# Patient Record
Sex: Male | Born: 1945 | Race: Black or African American | Hispanic: No | Marital: Single | Smoking: Never smoker
Health system: Southern US, Community
[De-identification: ages and names within clinical notes are randomized; demographics above are authoritative.]

## PROBLEM LIST (undated history)

## (undated) DIAGNOSIS — F0280 Dementia in other diseases classified elsewhere without behavioral disturbance: Secondary | ICD-10-CM

## (undated) DIAGNOSIS — D649 Anemia, unspecified: Secondary | ICD-10-CM

## (undated) DIAGNOSIS — F0390 Unspecified dementia without behavioral disturbance: Secondary | ICD-10-CM

## (undated) DIAGNOSIS — E119 Type 2 diabetes mellitus without complications: Secondary | ICD-10-CM

## (undated) DIAGNOSIS — I10 Essential (primary) hypertension: Secondary | ICD-10-CM

## (undated) DIAGNOSIS — I639 Cerebral infarction, unspecified: Secondary | ICD-10-CM

## (undated) DIAGNOSIS — G309 Alzheimer's disease, unspecified: Secondary | ICD-10-CM

## (undated) DIAGNOSIS — F329 Major depressive disorder, single episode, unspecified: Secondary | ICD-10-CM

## (undated) DIAGNOSIS — C189 Malignant neoplasm of colon, unspecified: Secondary | ICD-10-CM

## (undated) DIAGNOSIS — I4891 Unspecified atrial fibrillation: Secondary | ICD-10-CM

## (undated) DIAGNOSIS — N289 Disorder of kidney and ureter, unspecified: Secondary | ICD-10-CM

## (undated) DIAGNOSIS — E876 Hypokalemia: Secondary | ICD-10-CM

## (undated) DIAGNOSIS — N390 Urinary tract infection, site not specified: Secondary | ICD-10-CM

## (undated) DIAGNOSIS — R1310 Dysphagia, unspecified: Secondary | ICD-10-CM

## (undated) DIAGNOSIS — R296 Repeated falls: Secondary | ICD-10-CM

## (undated) DIAGNOSIS — H9190 Unspecified hearing loss, unspecified ear: Secondary | ICD-10-CM

## (undated) DIAGNOSIS — E871 Hypo-osmolality and hyponatremia: Secondary | ICD-10-CM

## (undated) DIAGNOSIS — E785 Hyperlipidemia, unspecified: Secondary | ICD-10-CM

## (undated) DIAGNOSIS — F32A Depression, unspecified: Secondary | ICD-10-CM

## (undated) DIAGNOSIS — I509 Heart failure, unspecified: Secondary | ICD-10-CM

## (undated) DIAGNOSIS — F22 Delusional disorders: Secondary | ICD-10-CM

## (undated) HISTORY — PX: CHOLECYSTECTOMY: SHX55

## (undated) HISTORY — PX: CARDIAC CATHETERIZATION: SHX172

## (undated) HISTORY — PX: COLON SURGERY: SHX602

## (undated) HISTORY — PX: PEG TUBE REMOVAL: SHX2187

## (undated) HISTORY — PX: ILEOSTOMY: SHX1783

## (undated) HISTORY — PX: ABDOMINAL ADHESION SURGERY: SHX90

---

## 1997-04-13 ENCOUNTER — Emergency Department (HOSPITAL_COMMUNITY): Admission: EM | Admit: 1997-04-13 | Discharge: 1997-04-13 | Payer: Self-pay | Admitting: Emergency Medicine

## 1997-06-27 ENCOUNTER — Other Ambulatory Visit: Admission: RE | Admit: 1997-06-27 | Discharge: 1997-06-27 | Payer: Self-pay | Admitting: Family Medicine

## 1997-12-17 ENCOUNTER — Encounter: Payer: Self-pay | Admitting: Family Medicine

## 1997-12-17 ENCOUNTER — Ambulatory Visit (HOSPITAL_COMMUNITY): Admission: RE | Admit: 1997-12-17 | Discharge: 1997-12-17 | Payer: Self-pay | Admitting: Family Medicine

## 1997-12-19 ENCOUNTER — Ambulatory Visit (HOSPITAL_COMMUNITY): Admission: RE | Admit: 1997-12-19 | Discharge: 1997-12-19 | Payer: Self-pay | Admitting: Internal Medicine

## 1998-05-29 ENCOUNTER — Emergency Department (HOSPITAL_COMMUNITY): Admission: EM | Admit: 1998-05-29 | Discharge: 1998-05-29 | Payer: Self-pay | Admitting: Emergency Medicine

## 1998-07-07 ENCOUNTER — Ambulatory Visit (HOSPITAL_COMMUNITY): Admission: RE | Admit: 1998-07-07 | Discharge: 1998-07-07 | Payer: Self-pay | Admitting: Internal Medicine

## 2003-07-19 ENCOUNTER — Emergency Department (HOSPITAL_COMMUNITY): Admission: EM | Admit: 2003-07-19 | Discharge: 2003-07-19 | Payer: Self-pay

## 2003-09-17 ENCOUNTER — Ambulatory Visit: Payer: Self-pay | Admitting: Nurse Practitioner

## 2003-09-19 ENCOUNTER — Ambulatory Visit: Payer: Self-pay | Admitting: Nurse Practitioner

## 2003-10-16 ENCOUNTER — Ambulatory Visit: Payer: Self-pay | Admitting: *Deleted

## 2004-01-14 ENCOUNTER — Ambulatory Visit: Payer: Self-pay | Admitting: Nurse Practitioner

## 2004-01-23 ENCOUNTER — Ambulatory Visit: Payer: Self-pay | Admitting: Nurse Practitioner

## 2004-03-09 ENCOUNTER — Ambulatory Visit: Payer: Self-pay | Admitting: Nurse Practitioner

## 2004-04-02 ENCOUNTER — Ambulatory Visit: Payer: Self-pay | Admitting: Nurse Practitioner

## 2004-04-20 ENCOUNTER — Ambulatory Visit: Payer: Self-pay | Admitting: Nurse Practitioner

## 2004-06-18 ENCOUNTER — Ambulatory Visit: Payer: Self-pay | Admitting: Nurse Practitioner

## 2004-08-28 ENCOUNTER — Ambulatory Visit: Payer: Self-pay | Admitting: Nurse Practitioner

## 2004-09-09 ENCOUNTER — Ambulatory Visit: Payer: Self-pay | Admitting: Nurse Practitioner

## 2004-09-21 ENCOUNTER — Ambulatory Visit: Payer: Self-pay | Admitting: Nurse Practitioner

## 2004-09-22 ENCOUNTER — Ambulatory Visit: Payer: Self-pay | Admitting: Nurse Practitioner

## 2004-11-18 ENCOUNTER — Ambulatory Visit: Payer: Self-pay | Admitting: Nurse Practitioner

## 2004-12-28 ENCOUNTER — Ambulatory Visit: Payer: Self-pay | Admitting: Nurse Practitioner

## 2005-01-12 ENCOUNTER — Ambulatory Visit: Payer: Self-pay | Admitting: Nurse Practitioner

## 2005-02-10 ENCOUNTER — Ambulatory Visit: Payer: Self-pay | Admitting: Nurse Practitioner

## 2005-03-02 ENCOUNTER — Ambulatory Visit: Payer: Self-pay | Admitting: Nurse Practitioner

## 2005-04-16 ENCOUNTER — Ambulatory Visit: Payer: Self-pay | Admitting: Nurse Practitioner

## 2005-04-28 ENCOUNTER — Ambulatory Visit: Payer: Self-pay | Admitting: Nurse Practitioner

## 2005-05-07 ENCOUNTER — Ambulatory Visit: Payer: Self-pay | Admitting: Nurse Practitioner

## 2005-06-04 ENCOUNTER — Ambulatory Visit: Payer: Self-pay | Admitting: Nurse Practitioner

## 2005-06-18 ENCOUNTER — Ambulatory Visit: Payer: Self-pay | Admitting: Nurse Practitioner

## 2005-09-01 ENCOUNTER — Ambulatory Visit: Payer: Self-pay | Admitting: Nurse Practitioner

## 2005-10-04 ENCOUNTER — Ambulatory Visit: Payer: Self-pay | Admitting: Nurse Practitioner

## 2005-11-25 ENCOUNTER — Ambulatory Visit: Payer: Self-pay | Admitting: Nurse Practitioner

## 2006-01-25 ENCOUNTER — Ambulatory Visit: Payer: Self-pay | Admitting: Nurse Practitioner

## 2006-02-10 ENCOUNTER — Ambulatory Visit: Payer: Self-pay | Admitting: Nurse Practitioner

## 2006-03-08 ENCOUNTER — Ambulatory Visit: Payer: Self-pay | Admitting: Nurse Practitioner

## 2006-03-17 ENCOUNTER — Ambulatory Visit: Payer: Self-pay | Admitting: Nurse Practitioner

## 2006-05-10 ENCOUNTER — Ambulatory Visit: Payer: Self-pay | Admitting: Nurse Practitioner

## 2006-07-25 ENCOUNTER — Ambulatory Visit: Payer: Self-pay | Admitting: Internal Medicine

## 2006-08-01 ENCOUNTER — Encounter (INDEPENDENT_AMBULATORY_CARE_PROVIDER_SITE_OTHER): Payer: Self-pay | Admitting: Family Medicine

## 2006-08-01 ENCOUNTER — Ambulatory Visit: Payer: Self-pay | Admitting: *Deleted

## 2006-08-01 ENCOUNTER — Ambulatory Visit (HOSPITAL_COMMUNITY): Admission: RE | Admit: 2006-08-01 | Discharge: 2006-08-01 | Payer: Self-pay | Admitting: Family Medicine

## 2006-08-10 ENCOUNTER — Ambulatory Visit: Payer: Self-pay | Admitting: Internal Medicine

## 2006-08-10 ENCOUNTER — Encounter (INDEPENDENT_AMBULATORY_CARE_PROVIDER_SITE_OTHER): Payer: Self-pay | Admitting: Nurse Practitioner

## 2006-08-10 LAB — CONVERTED CEMR LAB
Cholesterol: 170 mg/dL (ref 0–200)
Triglycerides: 159 mg/dL — ABNORMAL HIGH (ref <150)

## 2006-11-24 ENCOUNTER — Ambulatory Visit: Payer: Self-pay | Admitting: Internal Medicine

## 2006-11-24 ENCOUNTER — Encounter (INDEPENDENT_AMBULATORY_CARE_PROVIDER_SITE_OTHER): Payer: Self-pay | Admitting: Nurse Practitioner

## 2006-11-24 LAB — CONVERTED CEMR LAB
AST: 15 units/L (ref 0–37)
Alkaline Phosphatase: 54 units/L (ref 39–117)
BUN: 19 mg/dL (ref 6–23)
Basophils Relative: 0 % (ref 0–1)
Calcium: 9.1 mg/dL (ref 8.4–10.5)
Creatinine, Ser: 0.97 mg/dL (ref 0.40–1.50)
Eosinophils Absolute: 0 10*3/uL — ABNORMAL LOW (ref 0.2–0.7)
Eosinophils Relative: 0 % (ref 0–5)
HDL: 46 mg/dL (ref >39)
Hemoglobin: 10.4 g/dL — ABNORMAL LOW (ref 13.0–17.0)
MCHC: 32.4 g/dL (ref 30.0–36.0)
MCV: 92.5 fL (ref 78.0–100.0)
Monocytes Absolute: 0.6 10*3/uL (ref 0.1–1.0)
Monocytes Relative: 15 % — ABNORMAL HIGH (ref 3–12)
RBC: 3.47 M/uL — ABNORMAL LOW (ref 4.22–5.81)
RDW: 15.3 % (ref 11.5–15.5)
TSH: 1.606 microintl units/mL (ref 0.350–5.50)
Total Bilirubin: 1.4 mg/dL — ABNORMAL HIGH (ref 0.3–1.2)
Total CHOL/HDL Ratio: 3.3
VLDL: 29 mg/dL (ref 0–40)

## 2007-01-12 DIAGNOSIS — C189 Malignant neoplasm of colon, unspecified: Secondary | ICD-10-CM

## 2007-01-12 HISTORY — DX: Malignant neoplasm of colon, unspecified: C18.9

## 2007-04-05 ENCOUNTER — Ambulatory Visit: Payer: Self-pay | Admitting: Family Medicine

## 2007-05-04 ENCOUNTER — Encounter: Admission: RE | Admit: 2007-05-04 | Discharge: 2007-05-04 | Payer: Self-pay | Admitting: Family Medicine

## 2007-06-02 ENCOUNTER — Ambulatory Visit (HOSPITAL_COMMUNITY): Admission: RE | Admit: 2007-06-02 | Discharge: 2007-06-02 | Payer: Self-pay | Admitting: Cardiology

## 2007-06-02 ENCOUNTER — Encounter (INDEPENDENT_AMBULATORY_CARE_PROVIDER_SITE_OTHER): Payer: Self-pay | Admitting: Cardiology

## 2007-06-02 ENCOUNTER — Ambulatory Visit: Payer: Self-pay | Admitting: Surgery

## 2007-06-07 ENCOUNTER — Ambulatory Visit (HOSPITAL_COMMUNITY): Admission: RE | Admit: 2007-06-07 | Discharge: 2007-06-07 | Payer: Self-pay | Admitting: Cardiology

## 2007-06-15 ENCOUNTER — Inpatient Hospital Stay (HOSPITAL_COMMUNITY): Admission: AD | Admit: 2007-06-15 | Discharge: 2007-06-21 | Payer: Self-pay | Admitting: Cardiology

## 2007-08-10 ENCOUNTER — Encounter (INDEPENDENT_AMBULATORY_CARE_PROVIDER_SITE_OTHER): Payer: Self-pay | Admitting: Internal Medicine

## 2007-08-10 ENCOUNTER — Inpatient Hospital Stay (HOSPITAL_COMMUNITY): Admission: EM | Admit: 2007-08-10 | Discharge: 2007-10-11 | Payer: Self-pay | Admitting: *Deleted

## 2007-08-10 ENCOUNTER — Ambulatory Visit: Payer: Self-pay | Admitting: Surgery

## 2007-08-17 ENCOUNTER — Encounter: Payer: Self-pay | Admitting: Gastroenterology

## 2007-08-23 ENCOUNTER — Ambulatory Visit: Payer: Self-pay | Admitting: Gastroenterology

## 2007-08-24 ENCOUNTER — Encounter: Payer: Self-pay | Admitting: Internal Medicine

## 2007-09-04 ENCOUNTER — Ambulatory Visit: Payer: Self-pay | Admitting: Internal Medicine

## 2007-09-06 HISTORY — PX: COLECTOMY: SHX59

## 2007-09-07 ENCOUNTER — Encounter (INDEPENDENT_AMBULATORY_CARE_PROVIDER_SITE_OTHER): Payer: Self-pay | Admitting: General Surgery

## 2007-09-20 ENCOUNTER — Ambulatory Visit: Payer: Self-pay | Admitting: Internal Medicine

## 2007-10-11 ENCOUNTER — Inpatient Hospital Stay: Admission: RE | Admit: 2007-10-11 | Discharge: 2007-10-24 | Payer: Self-pay | Admitting: Internal Medicine

## 2007-10-30 ENCOUNTER — Inpatient Hospital Stay (HOSPITAL_COMMUNITY): Admission: EM | Admit: 2007-10-30 | Discharge: 2007-11-01 | Payer: Self-pay | Admitting: Emergency Medicine

## 2008-06-04 ENCOUNTER — Ambulatory Visit: Payer: Self-pay | Admitting: Hematology & Oncology

## 2008-06-05 ENCOUNTER — Ambulatory Visit (HOSPITAL_COMMUNITY): Admission: RE | Admit: 2008-06-05 | Discharge: 2008-06-05 | Payer: Self-pay | Admitting: Internal Medicine

## 2008-06-28 LAB — CBC WITH DIFFERENTIAL (CANCER CENTER ONLY)
BASO%: 0.4 % (ref 0.0–2.0)
EOS%: 1.7 % (ref 0.0–7.0)
HCT: 37.5 % — ABNORMAL LOW (ref 38.7–49.9)
LYMPH#: 0.9 10*3/uL (ref 0.9–3.3)
LYMPH%: 22.4 % (ref 14.0–48.0)
MCH: 26.9 pg — ABNORMAL LOW (ref 28.0–33.4)
MCHC: 32.3 g/dL (ref 32.0–35.9)
MONO%: 10.9 % (ref 0.0–13.0)
NEUT%: 64.6 % (ref 40.0–80.0)
RDW: 15 % — ABNORMAL HIGH (ref 10.5–14.6)

## 2008-06-28 LAB — COMPREHENSIVE METABOLIC PANEL
AST: 34 U/L (ref 0–37)
Albumin: 4.4 g/dL (ref 3.5–5.2)
Alkaline Phosphatase: 64 U/L (ref 39–117)
Potassium: 4.1 mEq/L (ref 3.5–5.3)
Sodium: 141 mEq/L (ref 135–145)
Total Protein: 7.8 g/dL (ref 6.0–8.3)

## 2008-07-24 ENCOUNTER — Encounter (INDEPENDENT_AMBULATORY_CARE_PROVIDER_SITE_OTHER): Payer: Self-pay | Admitting: *Deleted

## 2008-09-06 ENCOUNTER — Encounter (INDEPENDENT_AMBULATORY_CARE_PROVIDER_SITE_OTHER): Payer: Self-pay | Admitting: *Deleted

## 2008-09-13 ENCOUNTER — Inpatient Hospital Stay (HOSPITAL_COMMUNITY): Admission: EM | Admit: 2008-09-13 | Discharge: 2008-09-17 | Payer: Self-pay | Admitting: Emergency Medicine

## 2008-09-13 ENCOUNTER — Encounter (INDEPENDENT_AMBULATORY_CARE_PROVIDER_SITE_OTHER): Payer: Self-pay | Admitting: *Deleted

## 2008-09-17 ENCOUNTER — Encounter (INDEPENDENT_AMBULATORY_CARE_PROVIDER_SITE_OTHER): Payer: Self-pay | Admitting: *Deleted

## 2008-10-01 ENCOUNTER — Ambulatory Visit: Payer: Self-pay | Admitting: Hematology & Oncology

## 2008-10-04 LAB — COMPREHENSIVE METABOLIC PANEL
AST: 13 U/L (ref 0–37)
Alkaline Phosphatase: 56 U/L (ref 39–117)
BUN: 16 mg/dL (ref 6–23)
Calcium: 9.2 mg/dL (ref 8.4–10.5)
Chloride: 104 mEq/L (ref 96–112)
Creatinine, Ser: 1.2 mg/dL (ref 0.40–1.50)
Total Bilirubin: 0.4 mg/dL (ref 0.3–1.2)

## 2008-10-04 LAB — CBC WITH DIFFERENTIAL (CANCER CENTER ONLY)
BASO#: 0 10*3/uL (ref 0.0–0.2)
BASO%: 0.4 % (ref 0.0–2.0)
HCT: 32.3 % — ABNORMAL LOW (ref 38.7–49.9)
LYMPH%: 20.9 % (ref 14.0–48.0)
MCHC: 33.3 g/dL (ref 32.0–35.9)
MCV: 80 fL — ABNORMAL LOW (ref 82–98)
MONO#: 0.3 10*3/uL (ref 0.1–0.9)
NEUT%: 69.6 % (ref 40.0–80.0)
RDW: 15.8 % — ABNORMAL HIGH (ref 10.5–14.6)
WBC: 3.5 10*3/uL — ABNORMAL LOW (ref 4.0–10.0)

## 2008-11-13 ENCOUNTER — Encounter: Payer: Self-pay | Admitting: Gastroenterology

## 2008-11-18 ENCOUNTER — Ambulatory Visit: Payer: Self-pay | Admitting: Gastroenterology

## 2008-11-18 DIAGNOSIS — G309 Alzheimer's disease, unspecified: Secondary | ICD-10-CM

## 2008-11-18 DIAGNOSIS — R933 Abnormal findings on diagnostic imaging of other parts of digestive tract: Secondary | ICD-10-CM | POA: Insufficient documentation

## 2008-11-18 DIAGNOSIS — F0280 Dementia in other diseases classified elsewhere without behavioral disturbance: Secondary | ICD-10-CM

## 2008-11-18 DIAGNOSIS — E119 Type 2 diabetes mellitus without complications: Secondary | ICD-10-CM | POA: Insufficient documentation

## 2008-12-02 ENCOUNTER — Ambulatory Visit: Payer: Self-pay | Admitting: Gastroenterology

## 2008-12-11 ENCOUNTER — Encounter: Payer: Self-pay | Admitting: Gastroenterology

## 2009-02-13 IMAGING — CR DG ABDOMEN ACUTE W/ 1V CHEST
3 series · 3 of 3 positions shown · non-contrast
Comparison: Acute abdominal series 08/26/2007

CLINICAL DATA: Abdominal pain, distension

ACUTE ABDOMEN SERIES (ABDOMEN 2 VIEW & CHEST 1 VIEW)

[t abdomen supine (1 of 2)]
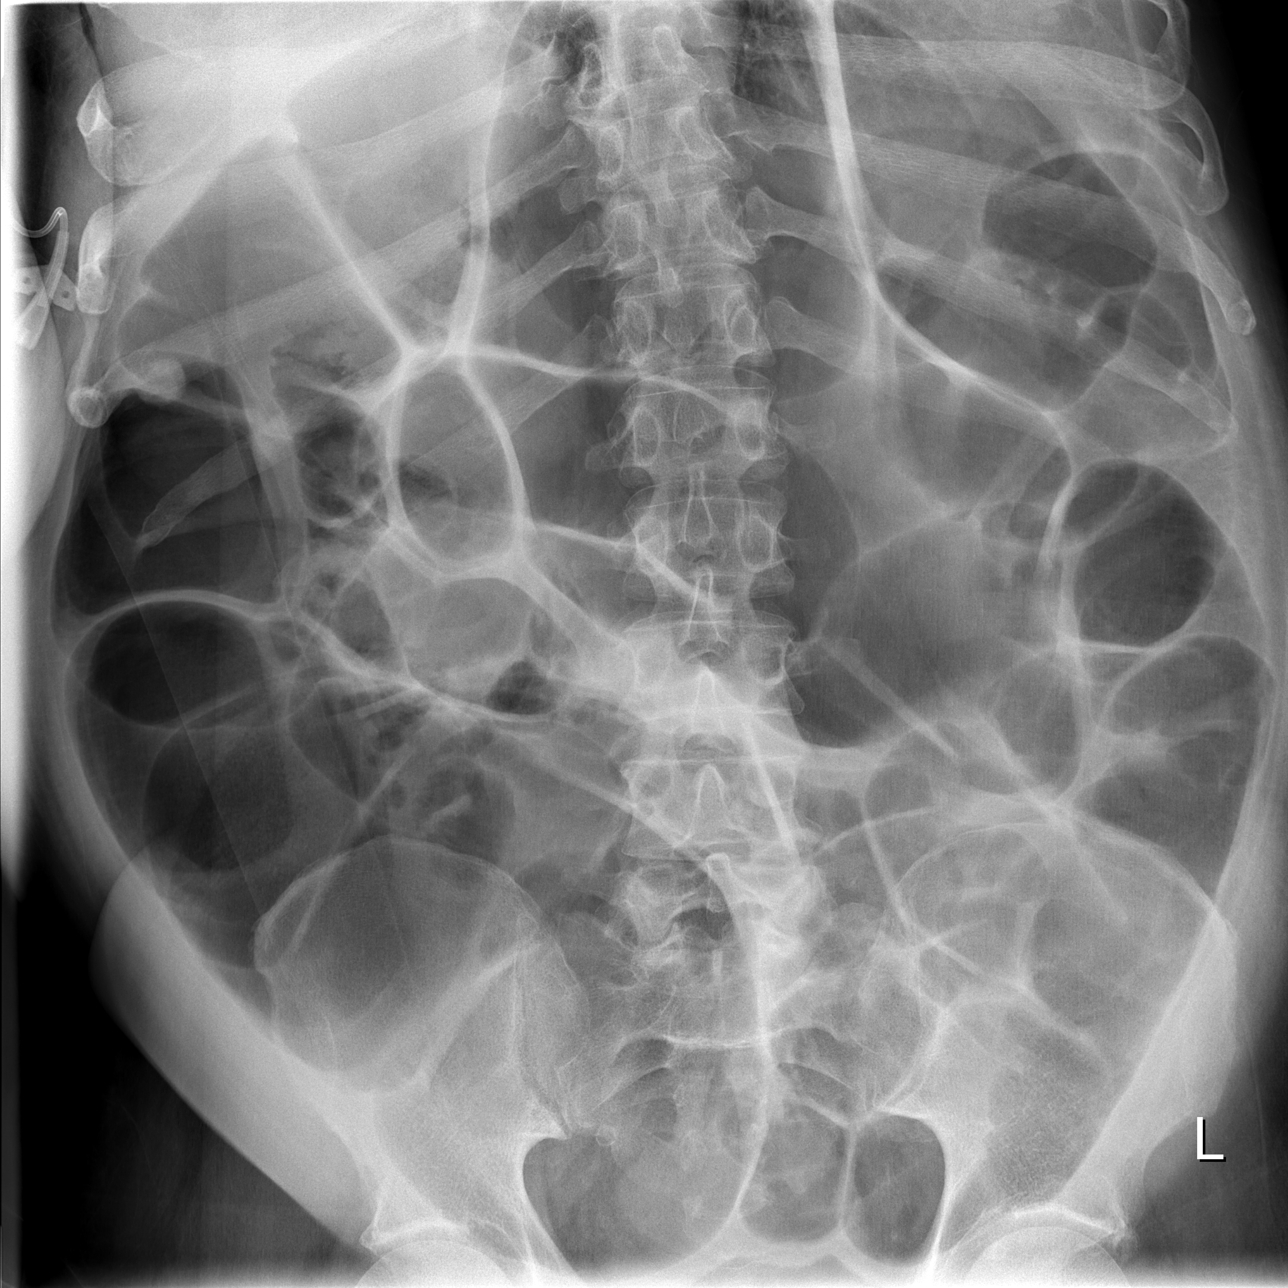

[t abdomen supine (2 of 2)]
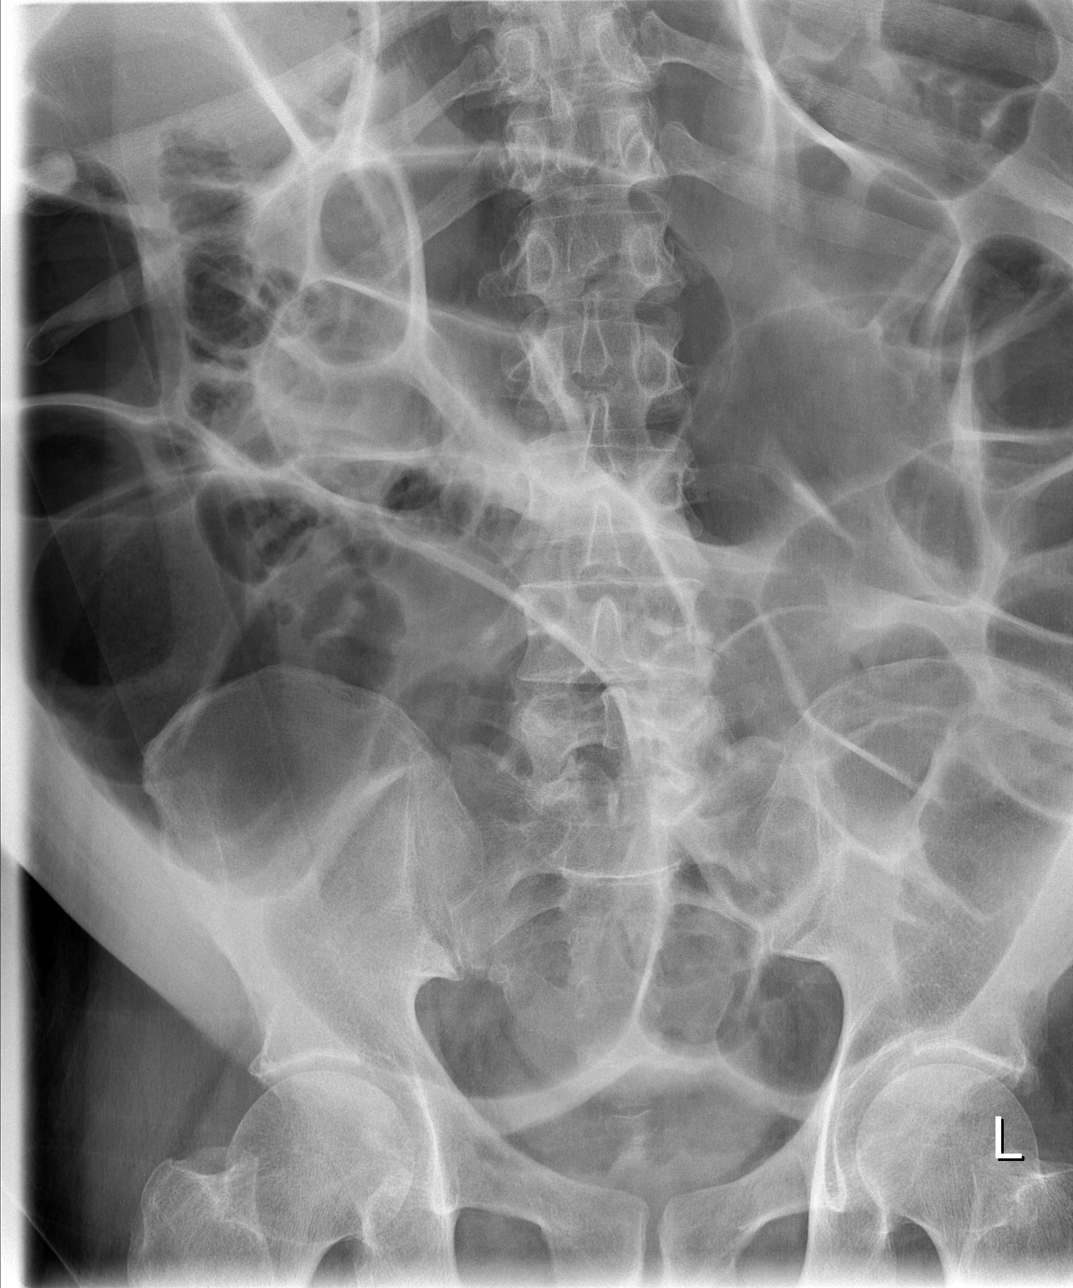

[w abdomen decub *]
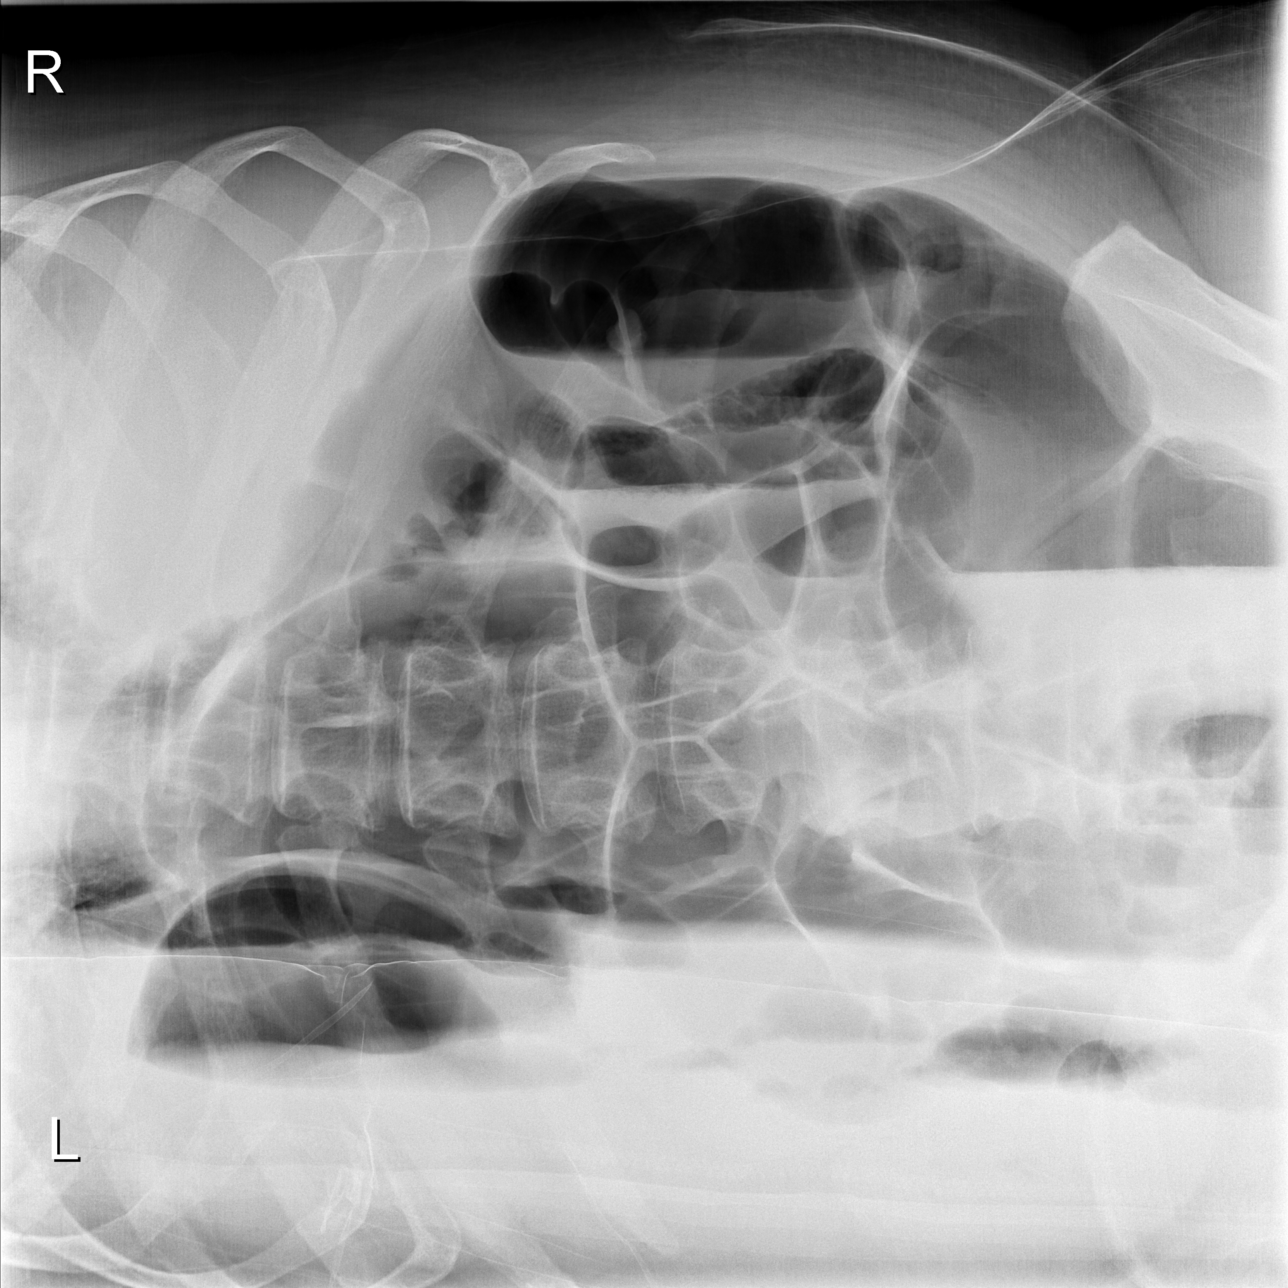

[3 of 3 positions shown; findings below may reference images not displayed]

FINDINGS: Right PICC line is in place with the tip in the mid right
atrium.  There are low lung volumes.  No focal opacity.  Heart
mildly enlarged.

Marked gaseous distension of the colon diffusely.  Suspect severe
ileus although distal obstruction could have this appearance.
Small bowel loops are nondistended.  No free air.
IMPRESSION: Marked gaseous distension of the colon diffusely, which I favor
represents severe ileus.  Distal obstruction could have this
appearance as well.  Findings are similar to prior CT.

## 2009-02-21 IMAGING — CT CT HEAD W/O CM
1 series · 16 of 30 positions shown, 20 images · non-contrast
Comparison: Brain MRI head and CT 08/10/2007

CLINICAL DATA: Altered mental status.

CT HEAD WITHOUT CONTRAST
TECHNIQUE: Contiguous axial images were obtained from the base of
the skull through the vertex without contrast.

[Series 2: (id) head 4.8 h37s st · axial · 0.56mm/px · z∈[+485,+622]mm · 16 of 30 slices shown, 20 images]
[im 2/30  brain]
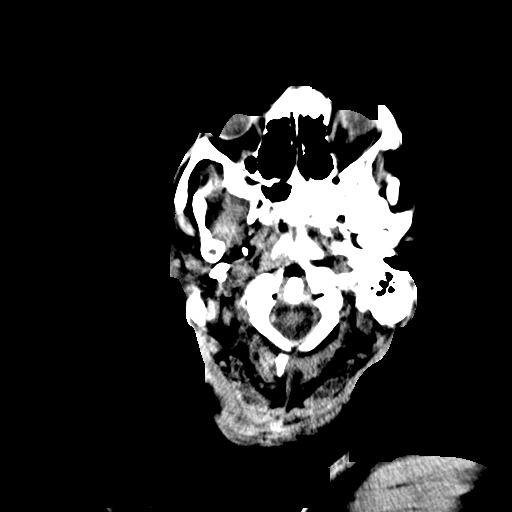
[im 2/30  bone]
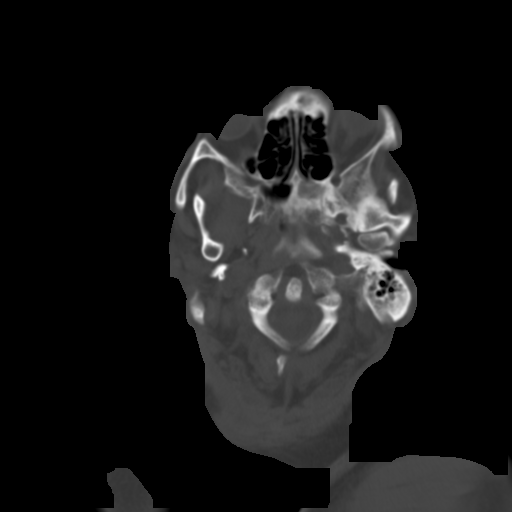
[im 4/30  brain]
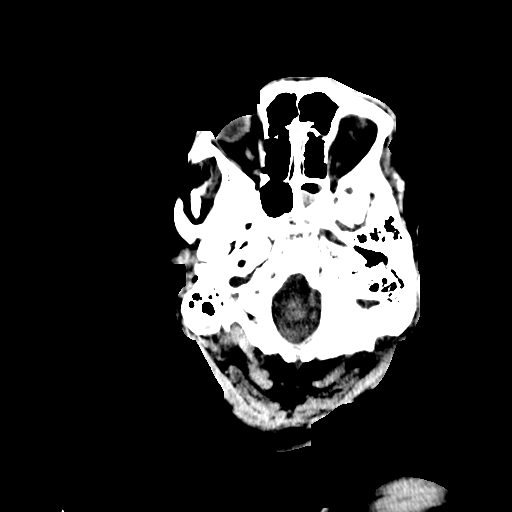
[im 6/30  brain]
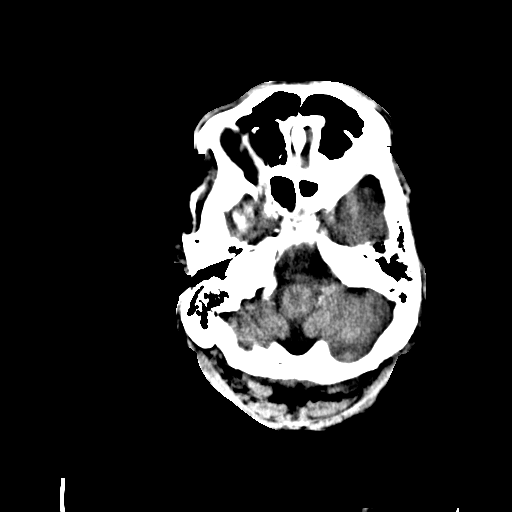
[im 8/30  brain]
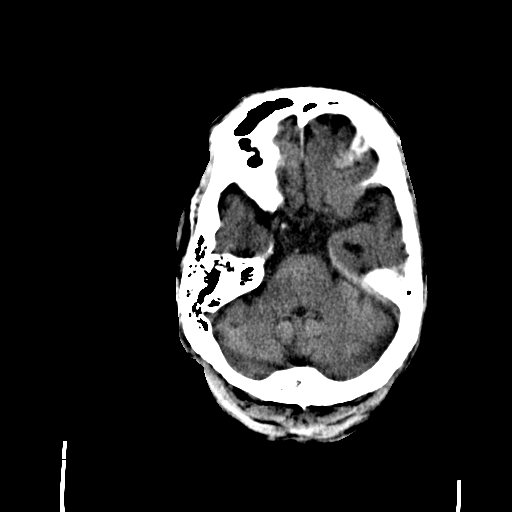
[im 9/30  brain]
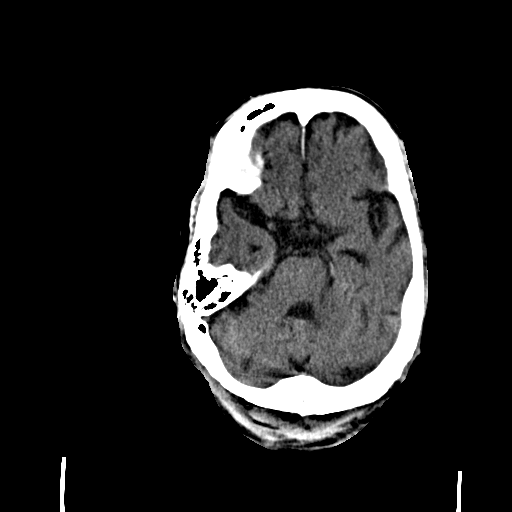
[im 9/30  bone]
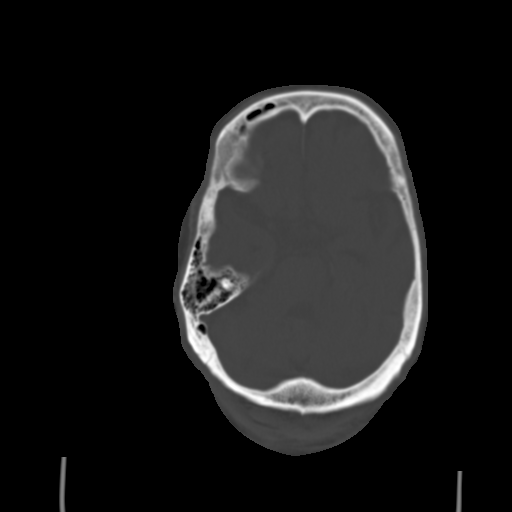
[im 11/30  brain]
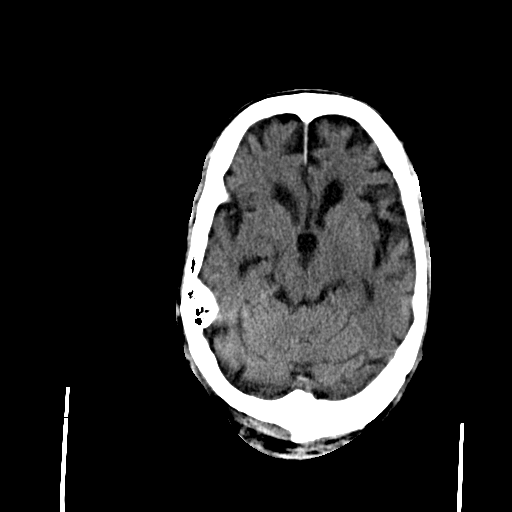
[im 13/30  brain]
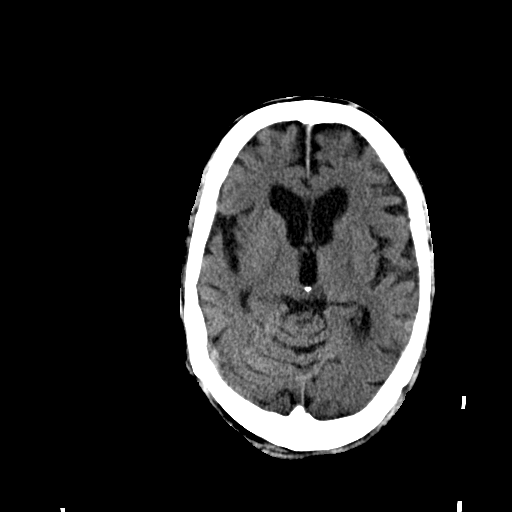
[im 15/30  brain]
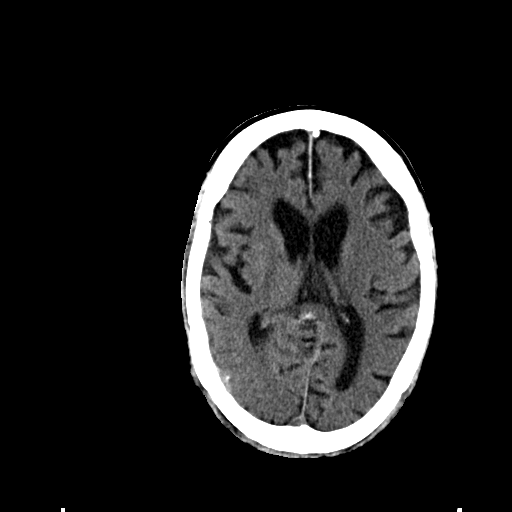
[im 16/30  brain]
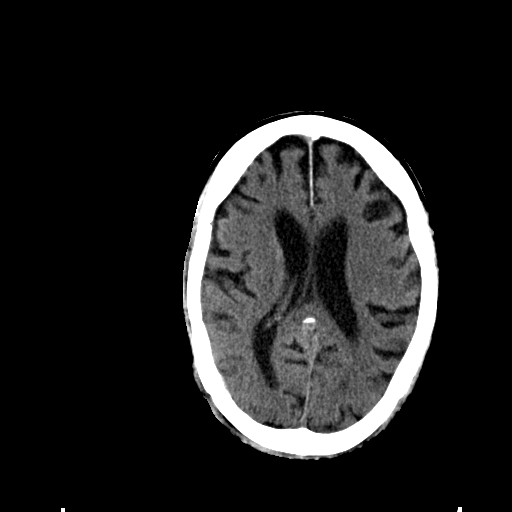
[im 16/30  bone]
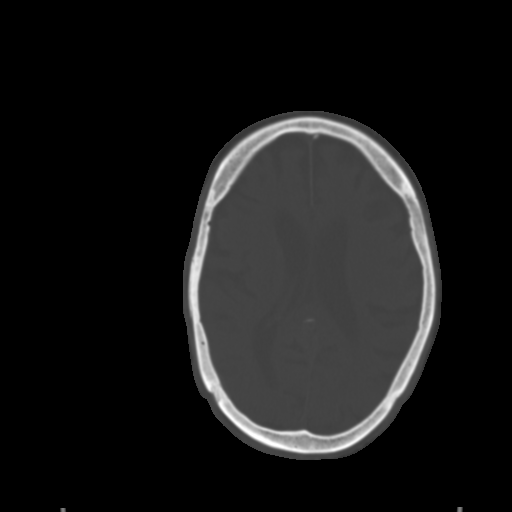
[im 18/30  brain]
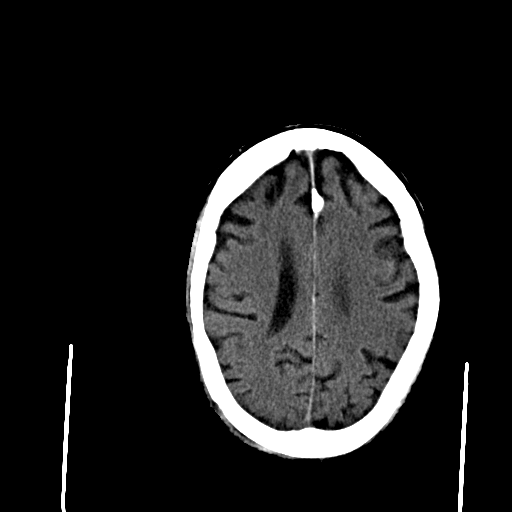
[im 20/30  brain]
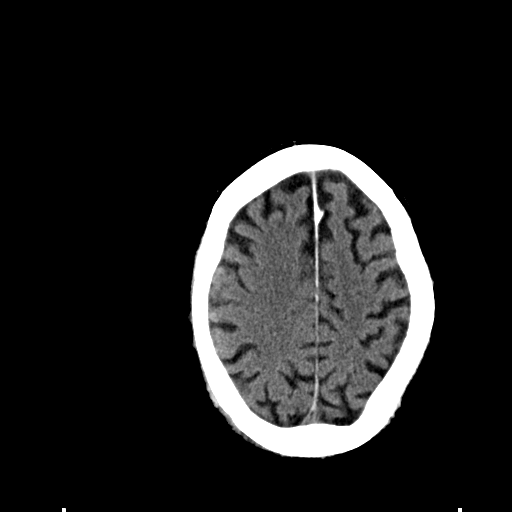
[im 22/30  brain]
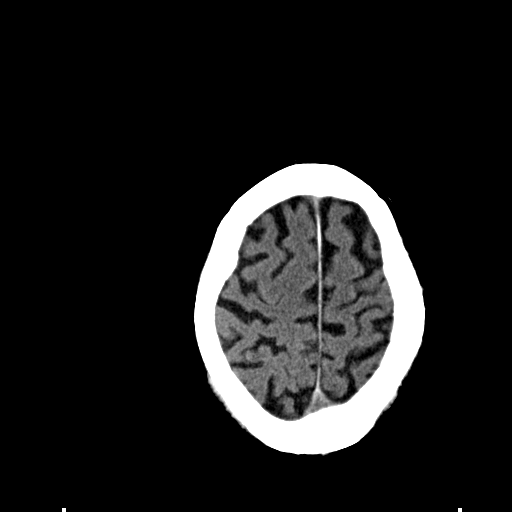
[im 23/30  brain]
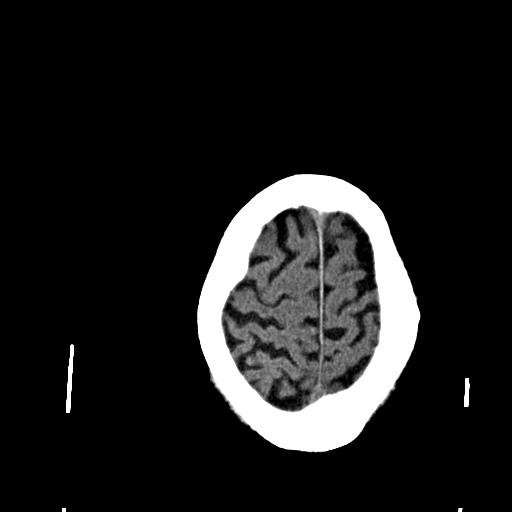
[im 23/30  bone]
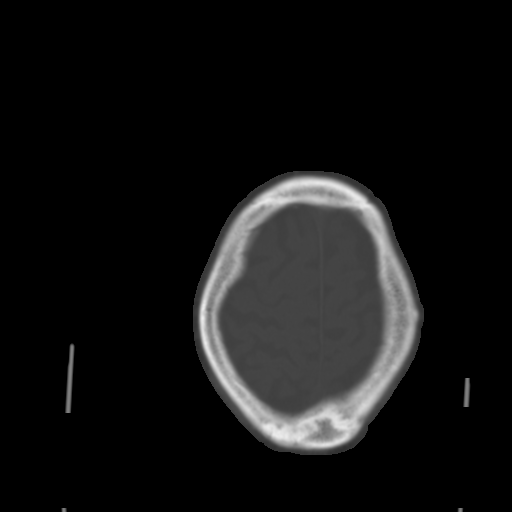
[im 25/30  brain]
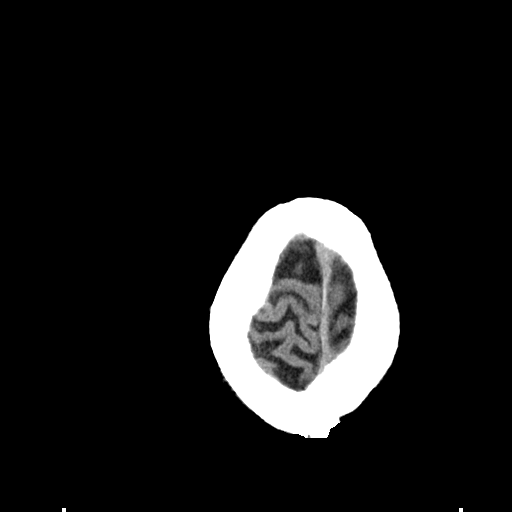
[im 27/30  brain]
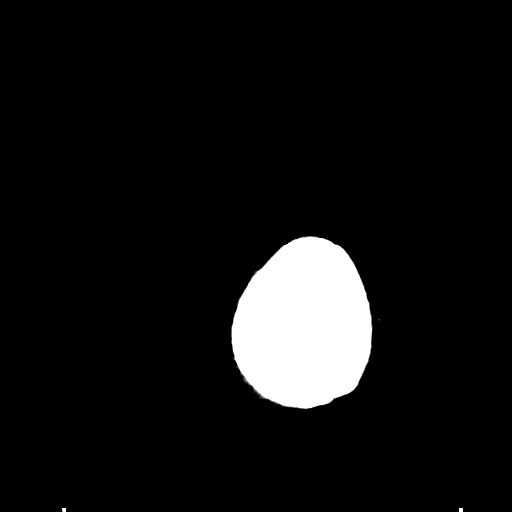
[im 29/30  brain]
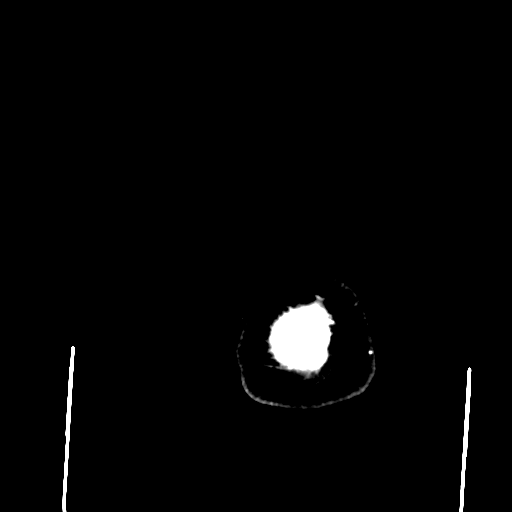

[16 of 30 positions shown; findings below may reference images not displayed]

FINDINGS: The brain is atrophic.  There is no evidence of acute
intracranial abnormality including hemorrhage, infarct, mass, mass
effect, midline shift or abnormal extra-axial fluid collection.  No
hydrocephalus.  Opacification of the left sphenoid sinus with some
calcification present is again noted.  Imaged paranasal sinuses and
mastoid air cells otherwise clear.
IMPRESSION: 1.  No acute intracranial abnormality.
2.  Chronic left sphenoid sinus disease.

## 2009-02-27 ENCOUNTER — Encounter: Payer: Self-pay | Admitting: Gastroenterology

## 2009-03-13 ENCOUNTER — Telehealth: Payer: Self-pay | Admitting: Gastroenterology

## 2009-03-25 ENCOUNTER — Telehealth: Payer: Self-pay | Admitting: Gastroenterology

## 2009-04-02 ENCOUNTER — Ambulatory Visit: Payer: Self-pay | Admitting: Hematology & Oncology

## 2009-04-09 LAB — COMPREHENSIVE METABOLIC PANEL
ALT: 13 U/L (ref 0–53)
BUN: 17 mg/dL (ref 6–23)
CO2: 24 mEq/L (ref 19–32)
Creatinine, Ser: 1.22 mg/dL (ref 0.40–1.50)
Total Bilirubin: 0.4 mg/dL (ref 0.3–1.2)

## 2009-04-09 LAB — CBC WITH DIFFERENTIAL (CANCER CENTER ONLY)
BASO%: 0.2 % (ref 0.0–2.0)
Eosinophils Absolute: 0.1 10*3/uL (ref 0.0–0.5)
LYMPH%: 17.4 % (ref 14.0–48.0)
MCH: 26 pg — ABNORMAL LOW (ref 28.0–33.4)
MCV: 81 fL — ABNORMAL LOW (ref 82–98)
MONO%: 6.2 % (ref 0.0–13.0)
Platelets: 219 10*3/uL (ref 145–400)
RDW: 17.1 % — ABNORMAL HIGH (ref 10.5–14.6)

## 2009-04-09 LAB — CEA: CEA: 1.8 ng/mL (ref 0.0–5.0)

## 2009-04-16 ENCOUNTER — Encounter (INDEPENDENT_AMBULATORY_CARE_PROVIDER_SITE_OTHER): Payer: Self-pay | Admitting: *Deleted

## 2009-04-23 ENCOUNTER — Ambulatory Visit: Payer: Self-pay | Admitting: Gastroenterology

## 2009-04-23 DIAGNOSIS — Z85038 Personal history of other malignant neoplasm of large intestine: Secondary | ICD-10-CM | POA: Insufficient documentation

## 2009-04-24 ENCOUNTER — Telehealth: Payer: Self-pay | Admitting: Internal Medicine

## 2009-04-29 ENCOUNTER — Encounter (INDEPENDENT_AMBULATORY_CARE_PROVIDER_SITE_OTHER): Payer: Self-pay | Admitting: *Deleted

## 2009-04-30 ENCOUNTER — Telehealth: Payer: Self-pay | Admitting: Gastroenterology

## 2009-04-30 ENCOUNTER — Ambulatory Visit: Payer: Self-pay | Admitting: Gastroenterology

## 2009-05-07 ENCOUNTER — Ambulatory Visit: Payer: Self-pay | Admitting: Gastroenterology

## 2009-05-07 ENCOUNTER — Ambulatory Visit (HOSPITAL_COMMUNITY): Admission: RE | Admit: 2009-05-07 | Discharge: 2009-05-07 | Payer: Self-pay | Admitting: Gastroenterology

## 2010-01-19 ENCOUNTER — Ambulatory Visit: Admit: 2010-01-19 | Payer: Self-pay | Admitting: Gastroenterology

## 2010-02-02 ENCOUNTER — Encounter: Payer: Self-pay | Admitting: Internal Medicine

## 2010-02-10 NOTE — Miscellaneous (Signed)
Summary: SEE COMMENTS BELOW! v10.0.Marland KitchenMarland Kitchencolon @ hospital 05/07/09  Clinical Lists Changes  Medications: Added new medication of MOVIPREP 100 GM  SOLR (PEG-KCL-NACL-NASULF-NA ASC-C) As directed - Signed Rx of MOVIPREP 100 GM  SOLR (PEG-KCL-NACL-NASULF-NA ASC-C) As directed;  #1 x 0;  Signed;  Entered by: Clide Cliff RN;  Authorized by: Louis Meckel MD;  Method used: Print then Give to Patient Observations: Added new observation of ALLERGY REV: Done (04/30/2009 13:39)    Prescriptions: MOVIPREP 100 GM  SOLR (PEG-KCL-NACL-NASULF-NA ASC-C) As directed  #1 x 0   Entered by:   Clide Cliff RN   Authorized by:   Louis Meckel MD   Signed by:   Clide Cliff RN on 04/30/2009   Method used:   Print then Give to Patient   RxID:   [id number]

## 2010-02-10 NOTE — Progress Notes (Signed)
Summary: Schedule Colonoscopy-POA MUST BE PRESANT FOR VISITS  Phone Note Outgoing Call Call back at [phone]   Call placed by: Harlow Mares CMA Duncan Dull),  March 13, 2009 11:07 AM Call placed to: Patient Summary of Call: left message on [place], patients sisters VM to have her call back and schedule patient Colonoscopy. I need to talk with her when she calls bc who ever is the POA will have to come with the patient if he is not his own POA. Initial call taken by: Harlow Mares CMA Duncan Dull),  March 13, 2009 11:07 AM  Follow-up for Phone Call        spoke to [place] she states that the recall letter was mailed to the assisted living facility and she never knew he needed a repeat colonoscopy. I advised her that he is due and that she would have to come with him for his previsit and colonoscopy due to the fact that she is his POA. I also advised her that she will have to provide [place] with a copy of her POA papers for [place] to scan into the patients chart. She will get with the assistant living and arrnage their schedules and call me back about setting up a time for the previsit and colonoscopy, since she has to be here for both visits.  Follow-up by: Harlow Mares CMA Duncan Dull),  March 13, 2009 12:55 PM  Additional Follow-up for Phone Call Additional follow up Details #1::        Patients POA states that her brother is another POA for for the patient and the assisted living facility and him are susposed to be working out the details of when to bring the paitent in for an office visit and colonoscopy. I adivsed her they needed to bring the POA papers when they came and a POA must stay with him at all times. She states that she understands.  Additional Follow-up by: Harlow Mares CMA Duncan Dull),  March 20, 2009 3:37 PM

## 2010-02-10 NOTE — Progress Notes (Signed)
Summary: NO POA  Phone Note Outgoing Call   Call placed by: Hortense Ramal CMA Duncan Dull),  April 30, 2009 2:07 PM Call placed to: Patient Responsible Party Summary of Call: Patient showed up for his previsit today with his brother who apparently is not his power of attorney. I and another staff member have spoken to [place] on several occasions to advise her that as his responsible party (we were under the impression that Ms Azucena Kuba was power of attorney) that she must be present at all visits/procedures in order for patient to have any further workup completed. I have called and spoken to Ms Azucena Kuba today to inquire as to why she is not present. She states "I am just his payee. There is no power of attorney." Unfortunately the patient has documented Alzheimer's Disease and will be unable to sign for himself. I have called the nursing home facility, [facility] and was told that they could not give me any information other than that the responsible party that they go through is [place]. Shirlee More, Clinic Site Manager spoke to a staff member in administration who states that the patient does not have power of attorney. Therefore, per [place], it is okay that the brother who has come with the patient be allowed to sign consent papers for the patient to have his colonoscopy procedure completed on 05/07/09 @ [facility]. Of note: Suzie, RN also called nursing home earlier and spoke to [employer] who also states that there is no power of attorney.  Initial call taken by: Hortense Ramal CMA Duncan Dull),  April 30, 2009 2:16 PM  Follow-up for Phone Call        ok Follow-up by: Louis Meckel MD,  May 07, 2009 9:53 AM

## 2010-02-10 NOTE — Assessment & Plan Note (Signed)
Summary: consult colon patient has colostomy/lk   History of Present Illness Visit Type: Follow-up Visit Primary GI MD: Melvia Heaps MD [facility] Primary Provider: Baltazar Najjar, MD Chief Complaint: screening colon, no problems, pt has colostomy History of Present Illness:   [name] has returned for a colonoscopy.  He colon cancer was diagnosed and  removed in August, 2009.  He has no GI complaints including change of bowel habits, abdominal pain, melena or hematochezia.   The patient is an insulin-dependent diabetic with dementia.   GI Review of Systems      Denies abdominal pain, acid reflux, belching, bloating, chest pain, dysphagia with liquids, dysphagia with solids, heartburn, loss of appetite, nausea, vomiting, vomiting blood, weight loss, and  weight gain.        Denies anal fissure, black tarry stools, change in bowel habit, constipation, diarrhea, diverticulosis, fecal incontinence, heme positive stool, hemorrhoids, irritable bowel syndrome, jaundice, light color stool, liver problems, rectal bleeding, and  rectal pain.    Current Medications (verified): 1)  Aspirin 81 Mg Tbec (Aspirin) .... Once Daily 2)  Flomax 0.4 Mg Caps (Tamsulosin Hcl) .... Once Daily 3)  Lasix 20 Mg Tabs (Furosemide) .... Take 1 Tablet By Mouth Once A Day 4)  Metoprolol Tartrate 25 Mg Tabs (Metoprolol Tartrate) .... Take 1/2 Tab By Mouth Once Daily 5)  Multivitamins  Tabs (Multiple Vitamin) .... Once Daily 6)  Prevacid 30 Mg Cpdr (Lansoprazole) .... Once Daily 7)  Prostate Therapy Complex  Caps (Misc Natural Products) .... Two Times A Day 8)  Vitamin B-12 1000 Mcg Tabs (Cyanocobalamin) .Marland Kitchen.. 1 Ml Im Every Month 9)  Ativan 1 Mg Tabs (Lorazepam) .... Take 1 Tablet Every 6 Hours As Needed 10)  Lantus 100 Unit/ml Soln (Insulin Glargine) .... 5 Units Once Daily 11)  Novolog 100 Unit/ml Soln (Insulin Aspart) .... Sliding Scale 12)  Vicodin 5-500 Mg Tabs (Hydrocodone-Acetaminophen) .... As Needed For  Pain 13)  Certa-[employer]ite Senior-Lutein  Tabs (Multiple Vitamins-Minerals) .... Take 1 Tablet By Mouth Once A Day 14)  Colace 100 Mg Caps (Docusate Sodium) .... Take 2 Capsules By Mouth Once Daily-Hold For Loose Stools 15)  Zoloft 100 Mg Tabs (Sertraline Hcl) .... Take 1 Tablet By Mouth Once A Day 16)  Vitamin C 500 Mg Tabs (Ascorbic Acid) .... Take 1 Tablet By Mouth Two Times A Day 17)  Myoflex 10 % Crea (Trolamine Salicylate) .... Apply To Shoulder Twice Daily 18)  Iron 325 (65 Fe) Mg Tabs (Ferrous Sulfate) .... Take 1 Tablet By Mouth Two Times A Day 19)  Zocor 40 Mg Tabs (Simvastatin) .... Take 1 Tab By Mouth At Bedtime 20)  Aricept 10 Mg Tabs (Donepezil Hcl) .... Take 1 Tab By Mouth At Bedtime 21)  Miralax  Powd (Polyethylene Glycol 3350) .Marland Kitchen.. 1 Capful As Needed  Allergies (verified): No Known Drug Allergies  Past History:  Past Medical History: Congestive Heart Failure Diabetes Hyperlipidemia Hypertension Obesity Gout Anemia Urinary Tract Infection Dementia GERD  Past Surgical History: Cardiac Catherization PEG placement (does not use) partial colectomy w/ilwostomy Cholecystectomy  Vital Signs:  Patient profile:   65 year old male Height:      71 inches Weight:      204 pounds BMI:     28.56 Pulse rate:   60 / minute Pulse rhythm:   regular BP sitting:   118 / 60  (right arm) Cuff size:   large  Vitals Entered By: [name] CMA Duncan [name]) (April 23, 2009 11:10 AM)  Physical  Exam  Additional Exam:  On physical exam he has an elderly male  examined while sitting in a wheelchair  skin: anicteric HEENT: normocephalic; PEERLA; no nasal or pharyngeal abnormalities neck: supple nodes: no cervical lymphadenopathy chest: clear to ausculatation and percussion heart: no murmurs, gallops, or rubs abd: soft, nontender; BS normoactive; no abdominal masses, tenderness, organomegaly; a gastrostomy tube is in place rectal: deferred ext: no cynanosis, clubbing,  edema skeletal: no deformities neuro: oriented x 3; no focal abnormalities    Impression & Recommendations:  Problem # 1:  ADENOCARCINOMA, COLON, HX OF (ICD-V10.05) Plan followup colonoscopy  Problem # 2:  DM (ICD-250.00) Assessment: Comment Only  Problem # 3:  ALZHEIMER'S DISEASE (ICD-331.0) Assessment: Comment Only  Patient Instructions: 1)  Previsit at [facility] has been scheduled for 4/20/11at 1:30 pm. 2)  Colonoscopy has been scheduled at [facility] (go to outpatient registration) for 05/07/09 @ 9:30 am. You will need to arrive at 8:30 am for registration. 3)  Colonoscopy and Flexible Sigmoidoscopy brochure given.  4)  Conscious Sedation brochure given.  5)  Copy sent to: Dr. Leanord Hawking 6)  The medication list was reviewed and reconciled.  All changed / newly prescribed medications were explained.  A complete medication list was provided to the patient / caregiver.   Of Note: Following patient's office visit with [place], [name], patient's POA called at which time we spoke about the patient. I have advised her that Derrick. Hartje needs a colonoscopy, however, we were unable to have him sign any paperwork today since he has dementia and is unable to sign for himself. [name] asked if her brother had shown to be with [name] today. I explained that the only individuals present with Derrick. Bedel were [name] and a driver from his nursing facility, [facility]. I gave Derrick. Reid dates and times of a previsit that I have set up as well as time and date of the actual colonoscopy procedure to be [name] at [facility]. Patient asks if her brother can sign the paperwork because she cannot take off work. I have advised her that if he is a legal POA then he is more than welcome to come sign the papers, however, he must bring written documentation that he is legal POA. If he is not legal POA than SHE must come with documentation that she is POA to the previsit and procedure. Derrick. [name] states  that she will speak to her brother and arrange for him to be here for previsit and colonoscopy. [name])  April 23, 2009 12:03 PM

## 2010-02-10 NOTE — Procedures (Signed)
Summary: Instruction for procedure/[facility] (out pt)  Instruction for procedure/[facility] (out pt)   Imported By: Sherian Rein 05/05/2009 09:49:14  _____________________________________________________________________  External Attachment:    Type:   Image     Comment:   External Document

## 2010-02-10 NOTE — Progress Notes (Signed)
Summary: Re: Colonoscopy Transport and POA Presence   Phone Note Call from Patient   Caller: Jeannie Done, Patient's POA Call For: Dottie, Dr Melvia Heaps Summary of Call: Ms. Azucena Kuba called to let us know that a nurse from [facility] would like some more information regarding patient's appointments. I have again advised Ms. Azucena Kuba of times and dates of patient's appointment and the need for a legal power of attorney to be at each of those visits with the patient (they are to bring poa papers at that time). She verbalizes understanding. I have also spoken to Rolly Pancake, RN at [facility] whom I have notified that patient will need transportation to his previsit here at the office on 04/30/09 @ 1:30 pm and for his colonoscopy to be completed on 05/07/09 @ 9:30 am with 8:30 am arrival. She verbalizes understanding and knows that I have also contacted Ms.Azucena Kuba to come to those visits as well. I have told Marcelino Duster that it is extremely important for patient to remain on clear liquid diet only the day before procedure and the day of procedure. We will send prep instructions for the patient to nursing home so they can prep him the day before test after his previsit on 04/30/09. Initial call taken by: Hortense Ramal CMA Duncan Dull),  April 24, 2009 3:17 PM

## 2010-02-10 NOTE — Progress Notes (Signed)
Summary: Scheduling colonoscopy  Phone Note From Other Clinic   Caller: Clinton P.K.  [phone] Call For: Dr. Arlyce Dice Summary of Call: Assistant living faciity returned your call about scheduling colonoscopy. Her records show pt.has a colostomy bag. Initial call taken by: Karna Christmas,  March 25, 2009 9:51 AM  Follow-up for Phone Call        scheduled an ov for the patient to come in and talk with Dr. Arlyce Dice about his colonoscopy. I advised PK that Jeannie Done the POA must come to ALL visit with the patient and that she must bring the POA papers when she comes. She will advise Mrs. Azucena Kuba.  Follow-up by: Harlow Mares CMA Duncan Dull),  March 25, 2009 10:27 AM

## 2010-02-10 NOTE — Letter (Signed)
Summary: [facility] Instructions  [facility]  [address] [place], [place] [address]   Phone: [phone]  Fax: [phone]       [name]    1945-01-16    MRN: [id number]        Procedure Day Dorna Bloom: Wednesday 05/07/2009     Arrival Time: 8:30 am      Procedure Time: 9:30 am     Location of Procedure:                     _ x_  [facility] ( Outpatient Registration)                        PREPARATION FOR COLONOSCOPY WITH MOVIPREP   Starting 5 days prior to your procedure Friday 4/22 do not eat nuts, seeds, popcorn, corn, beans, peas,  salads, or any raw vegetables.  Do not take any fiber supplements (e.g. Metamucil, Citrucel, and Benefiber).  THE DAY BEFORE YOUR PROCEDURE         DATE: Tuesday 4/26  1.  Drink clear liquids the entire day-NO SOLID FOOD  2.  Do not drink anything colored red or purple.  Avoid juices with pulp.  No orange juice.  3.  Drink at least 64 oz. (8 glasses) of fluid/clear liquids during the day to prevent dehydration and help the prep work efficiently.  CLEAR LIQUIDS INCLUDE: Water Jello Ice Popsicles Tea (sugar ok, no milk/cream) Powdered fruit flavored drinks Coffee (sugar ok, no milk/cream) Gatorade Juice: apple, white grape, white cranberry  Lemonade Clear bullion, consomm, broth Carbonated beverages (any kind) Strained chicken noodle soup Hard Candy                             4.  In the morning, mix first dose of MoviPrep solution:    Empty 1 Pouch A and 1 Pouch B into the disposable container    Add lukewarm drinking water to the top line of the container. Mix to dissolve    Refrigerate (mixed solution should be used within 24 hrs)  5.  Begin drinking the prep at 5:00 p.m. The MoviPrep container is divided by 4 marks.   Every 15 minutes drink the solution down to the next mark (approximately 8 oz) until the full liter is complete.   6.  Follow completed prep with 16 oz of clear liquid of your  choice (Nothing red or purple).  Continue to drink clear liquids until bedtime.  7.  Before going to bed, mix second dose of MoviPrep solution:    Empty 1 Pouch A and 1 Pouch B into the disposable container    Add lukewarm drinking water to the top line of the container. Mix to dissolve    Refrigerate  THE DAY OF YOUR PROCEDURE      DATE: Wednesday 4/27  Beginning at 4:30 a.m. (5 hours before procedure):         1. Every 15 minutes, drink the solution down to the next mark (approx 8 oz) until the full liter is complete.  2. Follow completed prep with 16 oz. of clear liquid of your choice.    3. You may drink clear liquids until 5:30 am  (4 HOURS BEFORE PROCEDURE).   MEDICATION INSTRUCTIONS  Unless otherwise instructed, you should take regular prescription medications with a small sip of water   as early as possible the  morning of your procedure.  Diabetic patients - see separate instructions.   Additional medication instructions:    Hold iron!!!!!         OTHER INSTRUCTIONS  You will need a responsible adult at least 65 years of age to accompany you and drive you home.   This person must remain in the waiting room during your procedure.  Wear loose fitting clothing that is easily removed.  Leave jewelry and other valuables at home.  However, you may wish to bring a book to read or  an iPod/MP3 player to listen to music as you wait for your procedure to start.  Remove all body piercing jewelry and leave at home.  Total time from sign-in until discharge is approximately 2-3 hours.  You should go home directly after your procedure and rest.  You can resume normal activities the  day after your procedure.  The day of your procedure you should not:   Drive   Make legal decisions   Operate machinery   Drink alcohol   Return to work  You will receive specific instructions about eating, activities and medications before you leave.    The above instructions  have been reviewed and explained to me by  Clide Cliff, RN _______________________    I fully understand and can verbalize these instructions _____________________________ Date _________

## 2010-02-10 NOTE — Medication Information (Signed)
Summary: MoviPrep/[facility]  MoviPrep/[facility]   Imported By: Sherian Rein 05/05/2009 09:45:33  _____________________________________________________________________  External Attachment:    Type:   Image     Comment:   External Document

## 2010-02-10 NOTE — Procedures (Signed)
Summary: Colonoscopy  Patient: [name] Note: All result statuses are Final unless otherwise noted.  Tests: (1) Colonoscopy (COL)   COL Colonoscopy           DONE     [facility]     [address] [place], [place]  [address]           COLONOSCOPY PROCEDURE REPORT           PATIENT:  [name], [name]  MR#:  [id number]     BIRTHDATE:  Jan 23, 1945, 64 yrs. old  GENDER:  male     ENDOSCOPIST:  Barbette Hair. Arlyce Dice, MD     REF. BY:     PROCEDURE DATE:  05/07/2009     PROCEDURE:  Colonoscopy via colostomy, Diagnostic Colonoscopy     ASA CLASS:  Class III     INDICATIONS:  history of colon cancer s/p right hemicolectomy with     ileostomy and mucus fistula     MEDICATIONS:  None           DESCRIPTION OF PROCEDURE:   After the risks benefits and     alternatives of the procedure were thoroughly explained, informed     consent was obtained.  No rectal exam performed. The [id number]     ([id number]) endoscope was introduced through the anus and advanced     to the , without limitations.  The quality of the prep was .  The     instrument was then slowly withdrawn as the colon was fully     examined.     <<PROCEDUREIMAGES>>           FINDINGS:  other finding. Scope was passed through the ostomy 50cm     into the ileum. No abnormalities were seen.     Following this the scope was passed through the rectum and into     proximal left colon. At this point the scope passed through a     fistula and out the abdomen. No mucosal abnormalities were seen     (see image001, image003, image004, and image005).   Retroflexed     views in the rectum revealed not done.    The scope was then     withdrawn from the patient and the procedure completed.           COMPLICATIONS:  None     ENDOSCOPIC IMPRESSION:Colocutaneous fistula           RECOMMENDATIONS:     1) Return to the care of your primary provider. GI follow up as     needed.  Since mucus fistula is not in continuity with the  proximal bowel no therapy is required for the colocutaneous     fistula.     REPEAT EXAM:  No           ______________________________     Barbette Hair. Arlyce Dice, MD           CC:  Baltazar Najjar, MD           n.     Rosalie Doctor:   Barbette Hair. Kayton Dunaj at 05/07/2009 10:30 AM           Dutch Gray, [phone]  Note: An exclamation mark (!) indicates a result that was not dispersed into the flowsheet. Document Creation Date: 05/07/2009 10:30 AM _______________________________________________________________________  (1) Order result status: Final Collection or observation date-time: 05/07/2009 10:25 Requested date-time:  Receipt date-time:  Reported date-time:  Referring  Physician:   Ordering Physician: Melvia Heaps [phone]) Specimen Source:  Source: Launa Grill Order Number: [id number] Lab site:

## 2010-02-10 NOTE — Miscellaneous (Signed)
Summary: Admission Record & Meds/[facility]  Admission Record & Meds/[facility]   Imported By: Sherian Rein 05/05/2009 09:47:31  _____________________________________________________________________  External Attachment:    Type:   Image     Comment:   External Document

## 2010-02-23 ENCOUNTER — Encounter: Payer: Self-pay | Admitting: Gastroenterology

## 2010-02-23 ENCOUNTER — Ambulatory Visit (INDEPENDENT_AMBULATORY_CARE_PROVIDER_SITE_OTHER): Payer: Medicare Other | Admitting: Gastroenterology

## 2010-02-23 DIAGNOSIS — Z85038 Personal history of other malignant neoplasm of large intestine: Secondary | ICD-10-CM

## 2010-02-23 DIAGNOSIS — K632 Fistula of intestine: Secondary | ICD-10-CM | POA: Insufficient documentation

## 2010-03-04 NOTE — Assessment & Plan Note (Signed)
Summary: Procedure FU   History of Present Illness Visit Type: Follow-up Visit Primary GI MD: Melvia Heaps MD [facility] Primary Provider: Baltazar Najjar, MD Chief Complaint: FU colonoscopy History of Present Illness:   [name] is a 65 year old African American male with  a history of colon cancer, status post right hemicolectomy, diverting ileostomy and mucous fistula, and a colocutaneous  fistula here for followup.  In April, 2011 he underwent followup colonoscopy that demonstrated a colocutaneous fistula connecting the mucous fistula with the skin.  No mucosal abnormalities were seen.  The patient has no GI complaints.   GI Review of Systems      Denies abdominal pain, acid reflux, belching, bloating, chest pain, dysphagia with liquids, dysphagia with solids, heartburn, loss of appetite, nausea, vomiting, vomiting blood, weight loss, and  weight gain.        Denies anal fissure, black tarry stools, change in bowel habit, constipation, diarrhea, diverticulosis, fecal incontinence, heme positive stool, hemorrhoids, irritable bowel syndrome, jaundice, light color stool, liver problems, rectal bleeding, and  rectal pain.    Current Medications (verified): 1)  Aspirin 81 Mg Tbec (Aspirin) .... Once Daily 2)  Flomax 0.4 Mg Caps (Tamsulosin Hcl) .... Once Daily 3)  Lasix 20 Mg Tabs (Furosemide) .... Take 1 Tablet By Mouth Once A Day 4)  Metoprolol Tartrate 25 Mg Tabs (Metoprolol Tartrate) .... Take 1/2 Tab By Mouth Once Daily 5)  Multivitamins  Tabs (Multiple Vitamin) .... Once Daily 6)  Prevacid 30 Mg Cpdr (Lansoprazole) .... Once Daily 7)  Prostate Therapy Complex  Caps (Misc Natural Products) .... Two Times A Day 8)  Vitamin B-12 1000 Mcg Tabs (Cyanocobalamin) .Marland Kitchen.. 1 Ml Im Every Month 9)  Ativan 1 Mg Tabs (Lorazepam) .... Take 1 Tablet Every 6 Hours As Needed 10)  Lantus 100 Unit/ml Soln (Insulin Glargine) .... 5 Units Once Daily 11)  Novolog 100 Unit/ml Soln (Insulin Aspart) .... Sliding  Scale 12)  Vicodin 5-500 Mg Tabs (Hydrocodone-Acetaminophen) .... As Needed For Pain 13)  Certa-[employer]ite Senior-Lutein  Tabs (Multiple Vitamins-Minerals) .... Take 1 Tablet By Mouth Once A Day 14)  Colace 100 Mg Caps (Docusate Sodium) .... Take 2 Capsules By Mouth Once Daily-Hold For Loose Stools 15)  Zoloft 100 Mg Tabs (Sertraline Hcl) .... Take 1 Tablet By Mouth Once A Day 16)  Vitamin C 500 Mg Tabs (Ascorbic Acid) .... Take 1 Tablet By Mouth Two Times A Day 17)  Myoflex 10 % Crea (Trolamine Salicylate) .... Apply To Shoulder Twice Daily 18)  Iron 325 (65 Fe) Mg Tabs (Ferrous Sulfate) .... Take 1 Tablet By Mouth Two Times A Day 19)  Zocor 40 Mg Tabs (Simvastatin) .... Take 1 Tab By Mouth At Bedtime 20)  Aricept 10 Mg Tabs (Donepezil Hcl) .... Take 1 Tab By Mouth At Bedtime 21)  Miralax  Powd (Polyethylene Glycol 3350) .Marland Kitchen.. 1 Capful As Needed  Allergies (verified): No Known Drug Allergies  Past History:  Past Medical History: Reviewed history from 04/23/2009 and no changes required. Congestive Heart Failure Diabetes Hyperlipidemia Hypertension Obesity Gout Anemia Urinary Tract Infection Dementia GERD  Past Surgical History: Reviewed history from 04/23/2009 and no changes required. Cardiac Catherization PEG placement (does not use) partial colectomy w/ilwostomy Cholecystectomy  Family History: Reviewed history from 11/18/2008 and no changes required. Unknown  Social History: Reviewed history from 10/14/2008 and no changes required. Patient has never smoked.  Alcohol Use - no Patient has impaired mobility  Review of Systems       The patient  complains of hearing problems.  The patient denies allergy/sinus, anemia, anxiety-new, arthritis/joint pain, back pain, blood in urine, breast changes/lumps, change in vision, confusion, cough, coughing up blood, depression-new, fainting, fatigue, fever, headaches-new, heart murmur, heart rhythm changes, itching, menstrual pain, muscle  pains/cramps, night sweats, nosebleeds, pregnancy symptoms, shortness of breath, skin rash, sleeping problems, sore throat, swelling of feet/legs, swollen lymph glands, thirst - excessive, urination - excessive, urination changes/pain, urine leakage, vision changes, and voice change.         All other systems were reviewed and were negative   Vital Signs:  Patient profile:   65 year old male Height:      66 inches Weight:      217.50 pounds BMI:     35.23 Pulse rate:   64 / minute Pulse rhythm:   regular BP sitting:   120 / 68  (left arm) Cuff size:   regular  Vitals Entered By: June McMurray CMA Duncan Dull) (February 23, 2010 11:25 AM)  Physical Exam  Additional Exam:  On physical exam he is a well-developed well-nourished male  skin: anicteric HEENT: normocephalic; PEERLA; no nasal or pharyngeal abnormalities neck: supple nodes: no cervical lymphadenopathy chest: clear to ausculatation and percussion heart: no murmurs, gallops, or rubs abd: soft, nontender; BS normoactive; no abdominal masses, tenderness, organomegaly; in the left perifocal area there is a small fistula rectal: deferred ext: no cynanosis, clubbing, edema skeletal: no deformities neuro: oriented x 3; no focal abnormalities    Impression & Recommendations:  Problem # 1:  FISTULA, INTESTINE (ICD-569.81) He has a colocutaneous fistula.  Since the colon is not in continuity with the more proximal bowel,  he has minimal discharge and hence, minimal symptoms.  Accordingly, I would not pursue any therapy for this.  Problem # 2:  ADENOCARCINOMA, COLON, HX OF (ICD-V10.05) Assessment: Comment Only Colonoscopy in April, 2011 and negative for polyps or recurrent CA.  Recommendations #1 no further followup  Patient Instructions: 1)  Copy sent to : Baltazar Najjar, MD 2)  Follow up as needed  3)  The medication list was reviewed and reconciled.  All changed / newly prescribed medications were explained.  A complete  medication list was provided to the patient / caregiver.

## 2010-04-15 LAB — GLUCOSE, CAPILLARY: Glucose-Capillary: 94 mg/dL (ref 70–99)

## 2010-04-17 LAB — CBC
HCT: 44.1 % (ref 39.0–52.0)
HCT: 45.9 % (ref 39.0–52.0)
MCHC: 32.2 g/dL (ref 30.0–36.0)
MCHC: 32.3 g/dL (ref 30.0–36.0)
MCHC: 32.8 g/dL (ref 30.0–36.0)
MCHC: 32.9 g/dL (ref 30.0–36.0)
MCV: 83.3 fL (ref 78.0–100.0)
MCV: 83.9 fL (ref 78.0–100.0)
MCV: 85.1 fL (ref 78.0–100.0)
Platelets: 297 10*3/uL (ref 150–400)
Platelets: 300 10*3/uL (ref 150–400)
RBC: 3.9 MIL/uL — ABNORMAL LOW (ref 4.22–5.81)
RBC: 4.68 MIL/uL (ref 4.22–5.81)
RDW: 14.4 % (ref 11.5–15.5)
RDW: 14.5 % (ref 11.5–15.5)
RDW: 14.5 % (ref 11.5–15.5)
WBC: 18.5 10*3/uL — ABNORMAL HIGH (ref 4.0–10.5)
WBC: 19.2 10*3/uL — ABNORMAL HIGH (ref 4.0–10.5)
WBC: 7 10*3/uL (ref 4.0–10.5)

## 2010-04-17 LAB — CULTURE, BLOOD (ROUTINE X 2): Culture: NO GROWTH

## 2010-04-17 LAB — GLUCOSE, CAPILLARY
Glucose-Capillary: 101 mg/dL — ABNORMAL HIGH (ref 70–99)
Glucose-Capillary: 108 mg/dL — ABNORMAL HIGH (ref 70–99)
Glucose-Capillary: 108 mg/dL — ABNORMAL HIGH (ref 70–99)
Glucose-Capillary: 109 mg/dL — ABNORMAL HIGH (ref 70–99)
Glucose-Capillary: 114 mg/dL — ABNORMAL HIGH (ref 70–99)
Glucose-Capillary: 118 mg/dL — ABNORMAL HIGH (ref 70–99)
Glucose-Capillary: 125 mg/dL — ABNORMAL HIGH (ref 70–99)
Glucose-Capillary: 128 mg/dL — ABNORMAL HIGH (ref 70–99)
Glucose-Capillary: 86 mg/dL (ref 70–99)
Glucose-Capillary: 93 mg/dL (ref 70–99)
Glucose-Capillary: 95 mg/dL (ref 70–99)
Glucose-Capillary: 96 mg/dL (ref 70–99)

## 2010-04-17 LAB — COMPREHENSIVE METABOLIC PANEL
ALT: 20 U/L (ref 0–53)
AST: 38 U/L — ABNORMAL HIGH (ref 0–37)
Albumin: 4.3 g/dL (ref 3.5–5.2)
Alkaline Phosphatase: 79 U/L (ref 39–117)
BUN: 41 mg/dL — ABNORMAL HIGH (ref 6–23)
CO2: 21 mEq/L (ref 19–32)
CO2: 21 mEq/L (ref 19–32)
Calcium: 10.6 mg/dL — ABNORMAL HIGH (ref 8.4–10.5)
Calcium: 8.9 mg/dL (ref 8.4–10.5)
Chloride: 106 mEq/L (ref 96–112)
Creatinine, Ser: 1.54 mg/dL — ABNORMAL HIGH (ref 0.4–1.5)
Creatinine, Ser: 1.78 mg/dL — ABNORMAL HIGH (ref 0.4–1.5)
GFR calc Af Amer: 46 mL/min — ABNORMAL LOW (ref >60)
GFR calc Af Amer: 47 mL/min — ABNORMAL LOW (ref >60)
GFR calc Af Amer: 55 mL/min — ABNORMAL LOW (ref >60)
GFR calc non Af Amer: 38 mL/min — ABNORMAL LOW (ref >60)
GFR calc non Af Amer: 46 mL/min — ABNORMAL LOW (ref >60)
Glucose, Bld: 128 mg/dL — ABNORMAL HIGH (ref 70–99)
Glucose, Bld: 142 mg/dL — ABNORMAL HIGH (ref 70–99)
Potassium: 3.9 mEq/L (ref 3.5–5.1)
Total Protein: 7.1 g/dL (ref 6.0–8.3)

## 2010-04-17 LAB — FECAL LACTOFERRIN, QUANT

## 2010-04-17 LAB — DIFFERENTIAL
Eosinophils Relative: 0 % (ref 0–5)
Lymphocytes Relative: 3 % — ABNORMAL LOW (ref 12–46)
Lymphs Abs: 0.5 10*3/uL — ABNORMAL LOW (ref 0.7–4.0)
Monocytes Absolute: 1.3 10*3/uL — ABNORMAL HIGH (ref 0.1–1.0)
Neutro Abs: 17.4 10*3/uL — ABNORMAL HIGH (ref 1.7–7.7)

## 2010-04-17 LAB — URINALYSIS, ROUTINE W REFLEX MICROSCOPIC
Hgb urine dipstick: NEGATIVE
Nitrite: NEGATIVE
Specific Gravity, Urine: 1.029 (ref 1.005–1.030)
Urobilinogen, UA: 0.2 mg/dL (ref 0.0–1.0)

## 2010-04-17 LAB — OVA AND PARASITE EXAMINATION

## 2010-04-17 LAB — URINE MICROSCOPIC-ADD ON

## 2010-04-17 LAB — CLOSTRIDIUM DIFFICILE EIA
C difficile Toxins A+B, EIA: NEGATIVE
C difficile Toxins A+B, EIA: NEGATIVE

## 2010-04-17 LAB — LIPASE, BLOOD: Lipase: 10 U/L — ABNORMAL LOW (ref 11–59)

## 2010-04-17 LAB — MAGNESIUM: Magnesium: 2.8 mg/dL — ABNORMAL HIGH (ref 1.5–2.5)

## 2010-04-17 LAB — STOOL CULTURE

## 2010-04-17 LAB — APTT: aPTT: 35 seconds (ref 24–37)

## 2010-05-26 NOTE — H&P (Signed)
[name], [name] NO.:  [id number]   MEDICAL RECORD NO.:  [id number]          PATIENT TYPE:  INP   LOCATION:  3703                         FACILITY:  [facility]   PHYSICIAN:  Lucita Ferrara, MD         DATE OF BIRTH:  Feb 26, 1945   DATE OF ADMISSION:  10/29/2007  DATE OF DISCHARGE:                              HISTORY & PHYSICAL   The patient is a 65 year old who is well known to our service. His  primary care physician is Dr. [name].  He is a 65 year old African-  American male who presents to [facility] status post  fall.  He lives in [address], where he fell. He fell on his head  and he injured his head with lacerations.  His history cannot clearly be  identified as the patient is demented, and there are unclear records  here.  But per records here in the hospital, the patient has recently  been discharged October 11, 2007 where he had a hospital stay for an  exploratory laparotomy and lysis of adhesions, subtotal colectomy, and  ileostomy.  He has a history of dysphagia and recurrent urinary tract  infection and urinary retention.  He is obviously demented and type 2  diabetic.   REVIEW OF SYSTEMS:  Cannot be delineated. The patient does not seem like  he has any changes in his neurological function from baseline. He does  not have unilateral focal neurological deficits.  There is no  documentation of fevers.   The patient's past medical history is significant for:  1. Hypertension.  2. CHF.  3. Diabetes type 2.  4. Gout.  5. Hypercholesterolemia.  6. Dementia.  7. History of dependent edema.  8. History of protein calorie malnutrition.   PAST SURGICAL HISTORY:  Status post cardiac catheterization.   ALLERGIES:  No known drug allergies.   MEDICATIONS:  Medications at the skilled nursing facility include:  1. Potassium chloride 20 mEq p.o. daily.  2. Metformin 500 mg p.o. daily.  3. Lasix 80 mg p.o. daily.  4. Simvastatin 40  mg p.o. daily.  5. Lopressor 25 mg p.o. b.i.d.  6. Aspirin 81 mg p.o. daily.   REVIEW OF SYSTEMS:  As per the HPI. Otherwise negative.   SOCIAL HISTORY:  Noncontributory   PHYSICAL EXAMINATION:  GENERAL:  As I examine him in the room he is  currently getting sutured up. He is obviously yelling nonsensical.  HEENT: There are two small lacerations above the left eyelid. He is  otherwise normocephalic, atraumatic.  Sclerae are anicteric.  CARDIOVASCULAR:  S1, S2. Regular rate and rhythm without murmurs, rubs,  or clicks.  LUNGS: Clear to auscultation bilaterally. No rhonchi, rales or wheezes.  ABDOMEN: Soft, nontender, nondistended.  Positive bowel sounds.  EXTREMITIES:  No clubbing, cyanosis or edema.   NEURO:  The patient's cranial nerves II-XII are grossly intact.  His  strength is intact, 5/5 bilaterally in the upper and lower extremities.   ED COURSE:  He had the repair of wound and laceration above the orbit  and the nose.  RADIOLOGICAL DATA:  The patient had a CT scan of the C-spine. A CT scan  of the head first of all shows left bundle periorbital soft tissue  swelling and laceration without underlying fracture, no acute  intracranial abnormality, stable advanced atrophy, cerebellar lacunar  infarcts.  The patient had a CT scan of cervical spine which shows  minimal lower cervical spondylosis without fracture of traumatic  subluxation. The patient had a CT scan of the maxillofacial area which  showed a minimally displaced left nasal bone fracture, laceration and  soft tissue swelling extending from the left frontal scalp along the  medial aspect of the orbit into the left side of the nose to unerupted  left molar teeth within the maxilla.  There is lucency anterior to the  teeth, may represent chronic infection, chronic left sphenoid sinusitis.   ASSESSMENT AND PLAN:  The patient is a 65 year old male status post fall  with nasal fracture and laceration to the eyelid area  who presents to  [facility].  1. Laceration to the eyelid, nasal fracture,.  2. Dementia and with history of fall and recurrent falls.  3. Diabetes type 2.  4. Hyperlipidemia.  5. History of coronary artery disease.   DISCUSSION AND PLAN:  Will go ahead and admit the patient to medical  telemetry unit, strict fall precautions, PT, OT. Obviously tomorrow need  to get ear, nose, throat surgery to be consulted to recommend any  interventions that need to be done for his nasal fracture which may be  none.  I will continue all his medications. Will avoid any  anticoagulation overnight.  The rest of the plans are dependent on his  progress. Will hold his aspirin for now overnight.      Lucita Ferrara, MD  Electronically Signed     RR/MEDQ  D:  10/30/2007  T:  10/30/2007  Job:  [id number]

## 2010-05-26 NOTE — Discharge Summary (Signed)
NAMESASHA, RUETH              ACCOUNT NO.:  [id number]   MEDICAL RECORD NO.:  [id number]          PATIENT TYPE:  INP   LOCATION:  3714                         FACILITY:  [facility]   PHYSICIAN:  Mohan N. Sharyn Lull, M.D. DATE OF BIRTH:  06-27-1945   DATE OF ADMISSION:  06/15/2007  DATE OF DISCHARGE:  06/21/2007                               DISCHARGE SUMMARY   ADMITTING DIAGNOSES:  1. Chest pain.  2. Exertional dyspnea.  3. Positive Persantine Myoview.  4. Rule out coronary insufficiency.  5. Chronic leg swelling.  6. Hypertension.  7. Hypercholesteremia.  8. Morbid obesity.  9. Non-insulin-dependent diabetes mellitus.  10.Degenerative joint disease.   FINAL DIAGNOSES:  1. Status post chest pain.  2. Positive Persantine Myoview.  3. Status post left catheterization.  4. Mild coronary artery disease.  5. Hypertension.  6. Non-insulin-dependent diabetes mellitus.  7. Hypercholesteremia.  8. Degenerative joint disease.  9. Chronic leg swelling with hematosis, venous insufficiency.   DISCHARGE MEDICATIONS:  1. Enteric-coated aspirin 81 mg 1 tablet daily.  2. Lopressor 25 mg 1 tablet twice daily.  3. Lasix 80 mg 1 tablet daily.  4. K-Dur 20 mEq 1 tablet daily.  5. Simvastatin 40 mg 1 tablet daily.  6. Metformin 500 mg 1 tablet daily with supper.   The patient has been advised to stop ibuprofen.   DIET:  Low-salt, low-cholesterol, 1800 calories ADA diet.   The patient has been advised to reduce weight.  Post cardiac cath  instructions have been given.  The patient has been advised to monitor  blood sugars daily.   ACTIVITY:  Increase activity slowly.  No driving or lifting, no pushing  or pulling for 1 week.   CONDITION AT DISCHARGE:  Stable.   Follow up with me in 1 week and Dr. Shana Chute as scheduled.   BRIEF HISTORY AND HOSPITAL COURSE:  [name] is a 65 year old black  male with past medical history significant for hypertension, non-insulin-  dependent diabetes  mellitus, hypercholesteremia, degenerative joint  disease, and complains of vague chest pain associated with shortness of  breath.  The patient denies any nausea, vomiting, or diaphoresis.  Denies palpitation, lightheadedness, or syncope.  Denies PND, orthopnea  but complains of leg swelling.  The patient underwent Persantine Myoview  on Jun 07, 2007, which showed reversible ischemia in the anterolateral  wall with EF of 69%.  The patient also complains of progressive chronic  leg swelling for past few months.  Denies any alcohol abuse.  Denies  urinary complaints.  Denies fever or chills.   PAST MEDICAL HISTORY:  As above.   PAST SURGICAL HISTORY:  Tonsillectomy in the past.   ALLERGIES:  No known drug allergies.   MEDICATION AT HOME:  He is on:  1. Atenolol 50 mg p.o. daily.  2. Lasix 40 mg p.o. daily.  3. K-Dur 20 mEq p.o. daily.  4. Simvastatin 20 mg p.o. daily.  5. Metformin 500 p.o. daily.   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  Used to drink beer socially.   FAMILY HISTORY:  Mother died of  colon cancer.  Father is alive, he is in  his 10s.  One brother and sister are diabetic.   PHYSICAL EXAMINATION:  GENERAL:  He is alert, awake, and oriented x3, in  no acute distress.  His affect is flat.  VITAL SIGNS:  blood pressure was 140/80, pulse was 78, regular.  HEENT:  Conjunctivae was pink.  NECK:  Supple. No JVD. No bruits.  LUNGS:  Clear to auscultation without rhonchi or rales.  CARDIOVASCULAR:  S1 and S2 was normal. There was soft systolic murmur.  ABDOMEN:  Soft. Bowel sounds were present, nontender.  EXTREMITIES:  There is no clubbing, cyanosis.  There was 4+ edema with  chronic dermatosis and blisters.   IMPRESSION:  Chest pain, exertional dyspnea, positive Persantine  Myoview, rule out coronary insufficiency, hypertension, non-insulin  dependent diabetes mellitus, hypercholesteremia, degenerative joint  disease, chronic leg swelling  with dermatosis and blisters.   LABORATORY DATA:  Chest X-Ray: Showed mild bibasilar atelectasis.  No  evidence of congestive heart failure.  His Persantine Myoview done on  Jun 07, 2007, showed area of reversibility anterolaterally concerning  for ischemia with EF of 69%.  EKG showed normal sinus rhythm with  nonspecific ST-T wave changes.  His labs hemoglobin was 11.9, hematocrit  35.3, potassium was 3.8, PT was 13.3, INR 1.0, and PTT 32.  Sodium 138,  potassium 3.3, glucose 102, BUN 12, creatinine 1.16.  His 2 sets of  cardiac enzymes were negative.  On June 19, 2007, his hemoglobin was  12.2, hematocrit 37.2, white count of 5.1.  BMET, sodium 138, potassium  3.6, glucose 130, BUN 16, and creatinine 1.23.   BRIEF HOSPITAL COURSE:  The patient was admitted to telemetry unit.  MI  was ruled out by serial enzymes and EKG.  The patient was started on IV  Lasix with excellent diuresis.  The patient did not have any episodes of  chest pain during the hospital stay.  The patient subsequently underwent  left cardiac cath with selective left and right coronary angiography as  per procedure report.  The patient tolerated procedure well.  There were  no complications.  The patient did not have any critical stenosis in LAD  territory.  The patient did not have any chest pain after the cath.  His  groin is stable with no evidence of hematoma or bruit.  The patient has  been ambulating in hallway.  His leg swelling has markedly improved.  The patient will be discharged home with family on above medications and  will be followed up in my office in 1 week and then with Dr. Shana Chute as  scheduled.      [name]. Sharyn Lull, M.D.  Electronically Signed     MNH/MEDQ  D:  06/21/2007  T:  06/22/2007  Job:  [id number]   cc:   Osvaldo Shipper. Spruill, M.D.

## 2010-05-26 NOTE — H&P (Signed)
[name], SARAFIAN              ACCOUNT NO.:  [id number]   MEDICAL RECORD NO.:  [id number]          PATIENT TYPE:  INP   LOCATION:  0101                         FACILITY:  [name]   PHYSICIAN:  [name], M.D.      DATE OF BIRTH:  [birth year]   DATE OF ADMISSION:  08/10/2007  DATE OF DISCHARGE:                              HISTORY & PHYSICAL   PRIMARY CARE PHYSICIAN:  Dr. [name].   PRESENTING COMPLAINT:  Altered mental status.   HISTORY OF PRESENT ILLNESS:  The patient is a 65 year old gentleman with  history of diabetes, hypertension and dyslipidemia, who was recently  hospitalized and treated for chest pain.  He was cathed by Dr. Sharyn Lull  with no significant coronary artery disease.  The patient was found  today at home obtunded and completely passed out.  His air conditioner  had to be removed to get to the patient.  He has underlying history of  CHF also.  The patient complained of right upper extremity weakness  somehow.  He described falling and was unable to get up.  He also  complained of his right shoulder giving out.  The patient is currently  able to communicate, but with difficulty.  There is no slurred speech.  No fever.  The patient has significant risk factors for strokes,  including dyslipidemia, hypertension and diabetes.   PAST MEDICAL HISTORY:  1. CHF with dependent edema.  2. Diabetes.  3. Gout.  4. Hypercholesterolemia.  5. Hypertension.  6. Morbid obesity.   ALLERGIES:  NO KNOWN DRUG ALLERGIES.   MEDICATIONS:  1. Potassium chloride 20 mEq daily.  2. Metformin 500 mg daily.  3. Lasix 80 mg daily.  4. Simvastatin 40 mg daily.  5. Lopressor 25 mg b.i.d.  6. Aspirin 82 mg daily.   SOCIAL HISTORY:  The patient was living at home.  No history of tobacco,  alcohol or IV drug use.   FAMILY HISTORY:  Noncontributory due to the patient's age and his  current condition.   REVIEW OF SYSTEMS:  The patient is not a good historian, but attempted  review of systems.  Twelve point is per HPI.   PHYSICAL EXAMINATION:  VITAL SIGNS:  Temperature 97.9, blood pressure  112/66, pulse 59-121, respiratory rate 18.  Sats 92% on room air.  GENERAL:  The patient is confused, awake, alert.  He is confused, awake,  but in no acute distress.  HEENT:  PERRL.  EOMI.  He has poor dentition.  NECK:  Neck is supple.  No JVD, no lymphadenopathy.  RESPIRATORY:  Good air entry bilaterally.  No wheezes or rales.  CARDIOVASCULAR SYSTEM:  The patient has S1-S2, no murmurs.  ABDOMEN:  Soft, nontender with positive bowel sounds.  EXTREMITIES:  1+ edema in the lower extremities.  NEURO:  Cranial nerves II-XII seem to be intact.  The patient has  slightly decreased power in the right upper extremity.  Otherwise, the  rest is 5/5 in the other three limbs.  Reflexes are 2+ bilaterally.   LABORATORY DATA:  His labs showed a white count of 9.2, hemoglobin 11.9,  platelet count 197 with a slight left shift.  PT 15.0, INR 1.1.  Sodium  is 139, potassium 2.7, chloride 91, CO2 of 36, glucose 151, BUN 62,  creatinine 3.86, calcium 10.1.  Initial cardiac enzymes negative.  Urinalysis is negative.  Chest x-ray shows mild cardiomegaly and lower  lung volumes.  Head CT without contrast shows no acute intracranial  abnormality.  The EKG showed sinus bradycardia with a rate of 57,  minimal voltage criteria for LVH.   ASSESSMENT:  Therefore, this is a 65 year old gentleman with multiple  medical problems found today with altered mental status and right upper  extremity mild weakness.  Differential could be transient ischemic  attack.  Hypoglycemic episode has been ruled out with.  His CBG has been  within normal limits.  Medications are likely.  Acute coronary syndrome  is also likely, although the patient has just had a clean cath about a  month ago.  Arrhythmias could also not be ruled out with concomitant  syncope.   PLAN:  1. Altered mental status:  Will admit the  patient and top priority      will be to work up for TIA versus CVA.  Will check MRI, MRA of the      brain, 2-D echocardiogram.  Will check homocysteine level.  B12,      RPR levels.  Will continue aspirin on the patient and Lovenox as      necessary.  PT/OT will be involved also.  We will also check serial      cardiac enzymes to rule out any cardiac involvement.  2. Right upper extremity weakness:  Again, this is most likely related      to an acute CVA.  Will continue with workup as indicated above.  3. Diabetes:  This is a non-insulin dependent.  We will hold his      metformin and give him sliding scale insulin as necessary.  4. Hypertension:  Seems to be controlled.  Once the patient has passed      swallow evaluation, will resume all his medications.  5. Dyslipidemia:  Again, will check fasting lipid panel and resume his      medicines once he passes a swallow evaluation.  6. Acute renal failure:  His prior creatinine was less than 1,      currently it is almost 4.  Suspect this is prerenal.  We will      hydrate the patient gently and follow his renal function closely.  7. Decreased hearing:  The patient apparently uses a hearing aid and      will be encouraged to do so while in the hospital.  Otherwise,      further treatment will depend on initial findings and the patient's      response to initial treatment.      [name], M.D.  Electronically Signed    LG/MEDQ  D:  08/10/2007  T:  08/10/2007  Job:  [id number]

## 2010-05-26 NOTE — Group Therapy Note (Signed)
[name], [name] NO.:  [id number]   MEDICAL RECORD NO.:  [id number]          PATIENT TYPE:  INP   LOCATION:  1305                         FACILITY:  [facility]   PHYSICIAN:  [name], M.D.       DATE OF BIRTH:  26-Aug-1945                                 PROGRESS NOTE   INTERIM DISCHARGE SUMMARY:  For a complete list of discharge diagnoses, please refer to the  previously dictated progress notes by Dr.  Arthor Captain, Dr. Brien Few and Dr. Glade Lloyd.  To those discharge diagnoses add new  diagnoses:  1. Aspiration pneumonia, status post ventilatory dependent respiratory      failure.  2. Acute on chronic renal failure.  3. Persistent delirium.  4. Critical care neuropathy.  5. Severe dysphagia.   DISCHARGE MEDICATIONS:  Will be dictated at the time of the actual discharge of the patient.   DISPOSITION:  [name] disposition is a work in progress pending his ability to  swallow.  If he becomes able to swallow, then he probably will be  discharged to a skilled nursing home for rehabilitation.  If the patient  remains unable to swallow, he probably should be evaluated for long-term  acute care hospital.   NEW PROCEDURE SINCE PREVIOUS DICTATIONS:  1. On September 06, 2007, the patient underwent an exploratory laparotomy      with lysis of adhesions, subtotal colectomy and ileostomy, mucous      fistula and cholecystectomy by Dr. Juanetta Gosling.  2. On September 12, 2007, placement of a central venous catheter in      the right internal jugular vein by Dr. Marchelle Gearing.  3. On September 12, 2007, placement of a right femoral arterial line      by Dr. Marchelle Gearing.  4. On September 12, 2007, orotracheal intubation performed by the      Anesthesia team.  5. Mechanical ventilation from September 12, 2007 until September 14, 2007.  6. On September 17, 2007, placement of a right percutaneous central      catheter, also known as PICC line.   CONSULTATIONS DURING THIS  PERIOD OF HOSPITALIZATION:  This patient has been seen by [facility], general surgery  and pulmonary critical care medicine.   HOSPITAL COURSE:  For hospital course covering the period from August 10, 2007 until  September 12, 2007, refer to the previously dictated discharge summaries  done by Dr. Arthor Captain, Dr. Brien Few and Dr. Glade Lloyd.  This note will attempt to  detail the course of the hospitalization from September 12, 2007 until  September 19, 2007.  On September 12, 2007, [name] had an episode of  emesis followed by aspiration respiratory distress and had to be  intubated and placed on mechanical ventilation.  The patient's abdomen  remained stable without any signs of dehiscence of abscess formation in  his abdomen.  [name] had BAL, blood cultures and urine cultures  without any pathogen isolate.  He was started empirically on broad-  spectrum antibiotics by using vancomycin, Zosyn and Diflucan.  Eventually, the patient was transitioned to Avelox  alone on September 15, 2007, after he was extubated successfully.  As the patient remained  without the ability to receive nutrition, he was started on total  parenteral nutrition.  He has been followed closely in the step-down  unit, initially with significant difficulties with his secretions.  He  required deep suctioning around-the-clock from respiratory therapy.  Finally on September 17, 2007, we started the patient on a scopolamine  patch and his secretion problem seems to be improving.  [name]  hyponatremia and acute renal failure responded nicely to intravenous  fluids and total parenteral nutrition and his numbers are improving.  At  the time of my discharge, his sodium is 144 and creatinine is 1.5.  [name] though has remained significantly delirious throughout this part  of the hospitalization, most likely due to his critical illness.  He had  a head CT which did not indicate any acute findings.  The  patient also  has significant weakness and deconditioning most likely due to his  prolonged illness and resultant critical care neuropathy.  EMG  studies/nerve conduction studies cannot be performed in this hospital.  At this point in the patient's hospitalization, he will need to pass a  swallowing evaluation and then be tried on a diet.  If that fails,  alternative means of nutrition like PEG tube or lifelong TNA can be  explored.      [name], M.D.  Electronically Signed     SL/MEDQ  D:  09/19/2007  T:  09/19/2007  Job:  [id number]

## 2010-05-26 NOTE — Discharge Summary (Signed)
[name], [name] NO.:  [id number]   MEDICAL RECORD NO.:  [id number]          PATIENT TYPE:  INP   LOCATION:  5527                         FACILITY:  [facility]   PHYSICIAN:  Elliot Cousin, M.D.    DATE OF BIRTH:  29-Nov-1945   DATE OF ADMISSION:  09/04/2007  DATE OF DISCHARGE:                               DISCHARGE SUMMARY   ANTICIPATED DATE OF DISCHARGE:  October 09, 2007 or October 10, 2007.   Please see the previous discharge summaries dictated by multiple  physicians.  Please see the most recent summary dictated by Dr.  Isidor Holts on October 02, 2007.   DISCHARGE DIAGNOSES AND HOSPITAL COURSE:  1. STATUS POST EXPLORATORY LAPAROTOMY, LYSIS OF ADHESIONS, SUBTOTAL      COLECTOMY, END ILEOSTOMY, MUCOUS FISTULA, AND CHOLECYSTECTOMY ON      September 06, 2007 BY DR. Dwain Sarna.  The patient is currently stable      and in improved condition.  The ileostomy is draining soft brown      stool.  The patient has no complaints of abdominal pain.  He does      have a small open wound that is being dressed daily.  The wound      site looks clean and noninfected.  Dr. Dwain Sarna evaluated the      patient 2 days ago and recommended ongoing wound and ostomy care.      He will need to see the patient for followup evaluation in 2-3      weeks after hospital discharge.  2. DYSPHAGIA AND MALNUTRITION.  The patient was treated with TNA for      the past 2 weeks.  The TNA was weaned off yesterday.  Because of      his severe dysphagia, the family did agree to having a G tube      placed.  Therefore, Interventional Radiology was consulted for      placement.  The patient underwent a successful G tube placement on      October 05, 2007 by Dr. Bonnielee Haff.  The registered dietician was      consulted for recommendations with regards to the appropriate PEG      feedings.  The patient has now been started on tube feedings with      Osmolite 1.2.  He is currently at his goal rate  of 70 mL an hour.      The patient is also receiving 100 mL of free water every 6 hours.      He is tolerating the tube feedings well.  His swallowing function      can certainly be reassessed at the skilled nursing facility.  3. URINARY TRACT INFECTION.  The patient was started on Rocephin 8      days ago.  He completed a 7-day course of Rocephin.  The patient is      currently afebrile and his white blood cell count is within normal      limits.  4. URINARY RETENTION.  A Foley catheter had to be reinserted because      the patient  was unable to urinate without it.  This was discussed      previously by Dr. Brien Few.  Flomax will be started today via the G      tube.  The patient will need to have a followup appointment      arranged to see an urologist for further evaluation and management.  5. ANEMIA SECONDARY TO VITAMIN B12 DEFICIENCY, ACUTE BLOOD LOSS ANEMIA      AND CHRONIC DISEASE.  The patient's hemoglobin has been ranging      from 8.5-9.5 over the past several days.  He was started on vitamin      B12 therapy a while ago. The patient will also be started on iron      supplementation with Nu-Iron via the G tube daily.  6. HYPERTENSION.  The patient's blood pressure has been well      controlled over the past several days on the Catapres patch.  7. TYPE 2 DIABETES MELLITUS.  The patient's capillary blood glucose      has been well controlled on sliding scale NovoLog.  8. DEMENTIA AND DECONDITIONING.  The patient is pleasantly demented.      He is very deconditioned.  Per the recommendations of the      occupational and physical therapists, he will require skilled      nursing facility placement.  We are awaiting the family's decision      with regards to which skilled nursing facility he will be placed      in.  I left a message with his sister and power-of -attorney, Mrs.      Azucena Kuba, to inform her that he was medically stable for hospital      discharge.   DISCHARGE MEDICATIONS:   1. Sliding scale NovoLog q.a.c. and nightly.  2. Lantus insulin 10 units subcu nightly.  3. Protonix 40 mg via G tube daily.  4. Aspirin 81 mg via G tube daily.  5. Neosporin ointment.  Apply to both feet daily p.r.n.  6. Scopolamine patch 1.5 mg.  Apply every 72 hours.  7. Vitamin B12.  1000 mcg IM every month.  8. Clonidine patch 0.2.  Change every 7 days.  9. Free water via the G tube 150 mL every 6 hours.  10.Osmolite PEG feedings.  (Osmolite 1.5) 70 mL an hour with one scoop      of ProMod (6 grams of protein).  11.Multivitamin with iron via G tube daily.  12.Iron supplement via G tube daily.  13.Flomax 0.4 mg per G tube nightly.   CONSULTATIONS:  1. General surgeon, Dr. Dwain Sarna.  2. Interventional radiologist, Dr. Bonnielee Haff.   PROCEDURE PERFORMED:  Placement of G tube by Dr. Bonnielee Haff on October 05, 2007.   DISCHARGE DISPOSITION:  The patient is approaching hospital discharge.  In fact, he is currently stable and in improved condition.  If the  family decides on a skilled nursing facility tomorrow, October 09, 2007, he can be discharged tomorrow.      Elliot Cousin, M.D.  Electronically Signed     DF/MEDQ  D:  10/08/2007  T:  10/08/2007  Job:  [id number]   cc:   Annia Friendly. Loleta Chance, MD  Juanetta Gosling, MD

## 2010-05-26 NOTE — Group Therapy Note (Signed)
NAMEWESS, BANEY NO.:  [id number]   MEDICAL RECORD NO.:  [id number]          PATIENT TYPE:  INP   LOCATION:  5527                         FACILITY:  [facility]   PHYSICIAN:  Theodosia Paling, MD    DATE OF BIRTH:  November 12, 1945                                 PROGRESS NOTE   INTERIM DISCHARGE SUMMARY  For a complex list of the discharge diagnoses, refer to the previously  dictated note by Dr. Arthor Captain, Dr. Brien Few and before that, me, Dr. Glade Lloyd.   Since September 19, 2007, the following issues have transpired.  The  patient had respiratory distress.  According to the last discharge  summary, the patient came back from the intensive care unit after  getting diagnosed with aspiration pneumonia.  His mentation slowly  improved at this time.  He is much more awake and follows commands and  occasionally converses in appropriate language as well.  His speech has  re-evaluated the patient because malnutrition was one of the major  concerns.  A Panda tube was tried to be placed so that he could receice  enteral nutrition; however, the patient was agitated and did not  cooperate very well with speech.  After the speech evaluation, the  patient was cleared for the clear-liquid intake, which he started  yesterday.  Calorie count for 48 hours was recommended for the patient.  Depending on that, further assessment will be done.  Family members  await a word that a jejunostomy tube may be considered in the future.  The family is in agreement with that.   At this time, these are the only concerns we are addressing in the  hospital.      Theodosia Paling, MD  Electronically Signed     NP/MEDQ  D:  09/26/2007  T:  09/27/2007  Job:  [id number]

## 2010-05-26 NOTE — Discharge Summary (Signed)
NAMELARZ, MARK NO.:  [id number]   MEDICAL RECORD NO.:  [id number]          PATIENT TYPE:  INP   LOCATION:  3703                         FACILITY:  [facility]   PHYSICIAN:  [name], M.D.DATE OF BIRTH:  September 13, 1945   DATE OF ADMISSION:  10/30/2007  DATE OF DISCHARGE:  11/01/2007                               DISCHARGE SUMMARY   ADDENDUM   Please note this is simply a brief addendum to a previously dictated  discharge summary on patient [name].  For details concerning his  hospitalization, please see his previously dictated summary by Dr.  Elliot Cousin, labeled job [phone], from October 31, 2007.  Per section  addendum to the discharge diagnoses:   Urinary retention - [name] has a known history of benign prostatic  hypertrophy.  He was on Flomax prior to his admission.  During his  hospital stay he was diagnosed with a urinary tract infection.  A  urinary catheter was placed given the fact that he had a urinary tract  infection as well as an acute renal insufficiency.  Attempts were made  to discontinue the Foley during his hospital stay.  Unfortunately the  patient displayed symptoms of urinary retention after discontinuation of  his Foley.  As a result Foley is being placed prior to his discharge.  It is felt that his Foley could be discontinued after the patient has  completed his full course of Ceftin for his urinary tract infection and  when Flomax has been maximized in his dosing.   ADDENDUM TO DISCHARGE MEDICATIONS:  Flomax 0.4 mg p.o. daily should be  added to the patient's discharge medication regimen.   ADDENDUM TO THE HOSPITAL COURSE:  Please see discussion above concerning  urinary retention.  The patient, otherwise, remained medically stable on  November 01, 2007, and is cleared for discharge to skilled nursing  facility.   FOLLOW-UP:  Dr. Lucky Cowboy has arranged a formal follow-up appointment  on November 06, 2007 at 11:30  in the morning.  Her office number is [phone].      [name], M.D.  Electronically Signed     JTM/MEDQ  D:  11/01/2007  T:  11/01/2007  Job:  [id number]

## 2010-05-26 NOTE — Cardiovascular Report (Signed)
[name], [name]              ACCOUNT NO.:  [id number]   MEDICAL RECORD NO.:  [id number]          PATIENT TYPE:  INP   LOCATION:  3714                         FACILITY:  [facility]   PHYSICIAN:  [name], M.D. DATE OF BIRTH:  Aug 25, 1945   DATE OF PROCEDURE:  06/20/2007  DATE OF DISCHARGE:                            CARDIAC CATHETERIZATION   PROCEDURE:  Left cardiac cath with selective left and right coronary  angiography, LV graft via right groin using Judkins technique.   INDICATION FOR THE PROCEDURE:  [name] is a 65 year old black male  with past medical history significant for hypertension, non-insulin-  dependent diabetes mellitus, hypercholesteremia, degenerative joint  disease, complains of vague chest pain associated with shortness of  breath.  Denies any nausea, vomiting, diaphoresis.  Denies palpitation,  lightheadedness or syncope.  Denies PND, orthopnea, but complains of leg  swelling.  The patient underwent Persantine Myoview on Jun 07, 2007,  which showed reversible ischemia in the anterolateral wall with EF of  69%.  Due to weight, chest pain, and multiple risk factors and abnormal  Persantine Myoview, the patient is referred for left cath, possible PCI.   PROCEDURE:  After obtaining informed consent, the patient was brought to  the cath lab and was placed on fluoroscopy table.  Right groin was  prepped and draped in usual fashion.  2% Xylocaine was used for local  anesthesia in the right groin.  With the help of thin-wall needle, 6-  French arterial sheath was placed.  The sheath was aspirated and  flushed.  Next, 6-French left Judkins catheter was advanced over the  wire under fluoroscopic guidance up to the ascending aorta.  The wire  was pulled out.  The catheter was aspirated and connected to the  manifold.  Catheter was further advanced and engaged into left coronary  ostium.  Multiple views of the left system were taken.  Next, the  catheter was  disengaged and was pulled out over the wire and was  replaced with 6-French right Judkins catheter which was advanced over  the wire under fluoroscopic guidance up to the ascending aorta.  Wire  was pulled out.  The catheter was aspirated and connected to the  manifold.  Catheter was further advanced and engaged into right coronary  ostium.  Multiple views of the right system were taken.  Next the  catheter was disengaged and was pulled out over the wire and was  replaced with 5-French pigtail catheter which was advanced over the wire  under fluoroscopic guidance up to the ascending aorta.  Wire was pulled  out.  The catheter was aspirated and connected to the manifold.  Catheter was further advanced across the aortic valve into the LV.  LV  pressures were recorded.  Next, LV graft was done in 30 degrees RAO  position.  Post angiographic pressures were recorded from LV and then  pullback pressures were recorded from the aorta.  There was no gradient  across the aortic valve.  Next, a pigtail catheter was pulled out over  the wire.  Sheaths aspirated and flushed.   FINDINGS:  LV showed good LV systolic function, EF of 55-60%.  Left main  was short which has almost separate ostium which is patent.  The LAD has  15-20% proximal and mid stenosis.  Diagonal 1 is small which is patent.  Ramus is very small which is patent.  Left circumflex has 5-10% proximal  stenosis.  OM1 is very small.  OM2 is  small which is patent.  OM3 is moderate size which is patent.  RCA has  20-25% mid stenosis.  PDA is very very small.  The patient has  codominant coronary system.  The patient tolerated procedure well.  There were no complications.      [name]. Sharyn [name], M.D.  Electronically Signed     MNH/MEDQ  D:  06/20/2007  T:  06/21/2007  Job:  [id number]   cc:   [name]. Spruill, M.D.

## 2010-05-26 NOTE — Discharge Summary (Signed)
NAMELIBERO, PUTHOFF NO.:  [id number]   MEDICAL RECORD NO.:  [id number]          PATIENT TYPE:  INP   LOCATION:  3703                         FACILITY:  [facility]   PHYSICIAN:  Elliot Cousin, M.D.    DATE OF BIRTH:  07/23/1945   DATE OF ADMISSION:  10/29/2007  DATE OF DISCHARGE:                               DISCHARGE SUMMARY   DISCHARGE DIAGNOSES:  1. Status post fall leading to minimally displaced left nasal bone      fracture, laceration and soft tissue swelling extending from the      left frontal scalp along the medial aspect of the orbit into the      left side of the nose.  2. Hyponatremia.  3. Hyperkalemia.  4. Acute renal insufficiency secondary to prerenal azotemia.  5. Leukocytosis secondary to stress reaction and possibly because of      an urinary tract infection.  6. Anemia, chronic.  7. Mild metabolic acidosis thought to be secondary to acute renal      failure.  8. Type 2 diabetes mellitus.  9. Dysphagia.  The patient's diet was downgraded to a dysphagia 1 with      thin liquids.  The patient continues to receive percutaneous      endoscopic gastrostomy feedings as well.  10.Hypertension.  11.Chronic dementia.  12.Possible urinary tract infection.  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 Ceftin 500 mg b.i.d. for 6 more days.  2. Stop metformin and potassium chloride. (PATIENT'S SERUM POTASSIUM      SHOULD BE MONITORED CLOSELY).  3. Lasix 20 mg b.i.d. and titrate back up accordingly.  4. Simvastatin 40 mg at bedtime.  5. Lopressor 25 mg b.i.d.  6. Aspirin 81 mg daily.  7. Osmolite 1.2 at 280 mL q.4 h. via the PEG tube.  8. Free water flushes, 120 mL q.4 h. following PEG tube feedings.  9. Sliding scale NovoLog q.4 h.  10.Lantus insulin 5 units subcutaneous at bedtime.  11.Multivitamin with iron once daily.   DISCHARGE DISPOSITION:  The patient is approaching medical stability.  The plan is to discharge  him to [facility].  ENT physician, Dr. Gerilyn Pilgrim, will arrange for the  patient to follow up with her in her office in the next 3-5 days.   CONSULTATIONS:  Lucky Cowboy, MD   PROCEDURES PERFORMED:  1. CT scan of the head on October 29, 2007.  The results revealed left      frontal and periorbital soft tissue swelling and laceration without      underlying fracture, no acute intracranial abnormality, stable age-      advanced atrophy and old cerebellar and lacunar infarcts.  2. CT scan of the cervical spine on October 29, 2007.  The results      revealed minimal lower cervical spondylosis without fracture or      traumatic subluxation.  3. CT scan of the maxillofacial area on October 29, 2007.  The results      revealed a minimally displaced left nasal bone fracture, laceration  and soft tissue swelling extending from the left frontal scalp      along the medial aspect of the orbit into the left side of the      nose, 2 unerupted left molar teeth within the maxilla; lucency      anterior to these teeth may represent chronic infection and chronic      left sphenoid sinusitis.  4. X-ray of the lumbar spine on October 29, 2007.  The results      revealed degenerative changes in the lower lumbar spine facets and      no acute abnormality.  5. X-ray of the pelvis on October 29, 2007.  The results revealed no      acute abnormality.  Minimal degenerative changes of the hips.   HISTORY OF PRESENT ILLNESS:  The patient is a 65 year old man with a  past medical history significant for dementia, congestive heart failure,  hypertension, and type 2 diabetes mellitus.  He presented to the  emergency department from [facility] after he fell and hit his  face and head.  When the patient was evaluated in the emergency  department, he was noted to be neurologically intact.  However, he was  and is confused chronically.  A number of CT scans were ordered and  in  essence, the CT scan of the face revealed a minimally displaced left  nasal bone fracture.  The patient was also noted to have lacerations on  the left side of his face, for which the emergency department physician  provided wound care and suturing of the respective areas.  The patient  was subsequently admitted for further evaluation and management.   Please see the dictated history and physical.   HOSPITAL COURSE:  1. STATUS POST FALL WITH RESULTANT LEFT NASAL BONE FRACTURE AND      ASSOCIATED LACERATIONS.  As noted above, the emergency department      physician sutured the lacerations.  Because of the nasal bone      fracture, ENT physician, Dr. Gerilyn Pilgrim, was consulted.  Dr. Gerilyn Pilgrim      evaluated the patient and noted that the patient had a closed nasal      fracture with obvious cosmetic deformity and left glabella and      nasal lacerations which were closed by the emergency department      physician.  She recommended supportive treatment.  She recommended      that the patient follow up with her in her office to remove the      sutures and for reassessment next week.  Dr. Gerilyn Pilgrim will arrange the      follow up.  Currently, the patient is pain free, although he is      somewhat demented.  He appears to be neurologically intact with      regards to his cranial nerves.  He was started on treatment for      pain empirically with Tylenol.  The patient was evaluated by both      the physical and occupational therapists; they recommended ongoing      therapy at the skilled nursing facility.  The patient is obviously      a fall risk and will need 24-hour monitoring to prevent future      falls.  2. HYPONATREMIA AND HYPERKALEMIA ASSOCIATED WITH ACUTE RENAL      INSUFFICIENCY.  At the time of the initial assessment, the      patient's BUN was elevated at 54, and  his creatinine was elevated      at 1.6.  His serum sodium was low at 127, and his serum potassium      was slightly elevated at  5.3.  The patient was started on IV fluid      volume repletion with normal saline.  His renal function was      followed closely.  After volume repletion, his serum sodium      improved to 133; his BUN improved to 39, and his creatinine      improved to 1.34.  His serum potassium is now 4.4, within normal      limits.  The patient had been treated with Lasix prior to this      hospitalization for his history of chronic congestive heart      failure.  The Lasix was discontinued initially.  However, upon      discharge, the Lasix will be restarted at a lower dose of 20 mg      b.i.d.  The dose can be titrated upward accordingly if needed or      per the discretion of his primary care physician at the skilled      nursing facility.  The potassium chloride supplementation was also      discontinued yesterday. The patient's serum potassium will need to      be monitored closely.  3. METABOLIC ACIDOSIS.  At the time of the initial hospital      assessment, the patient's CO2 was noted to be within normal limits      at 22.  However, the following day, his CO2 fell to 18.  Because of      the metabolic acidosis, the metformin was discontinued.  As above,      he was continued on treatment with IV fluids.  As of today, his CO2      is within normal limits at 23.  4. ANEMIA.  At the time of the initial hospital assessment, the      patient's hemoglobin was 12.6.  However, with IV fluid volume      repletion, his hemoglobin fell to a nadir of 10.9.  The decrease in      his hemoglobin was in part dilutional.  He will be started on a      multivitamin with iron today.  5. HYPERTENSION.  The patient's blood pressure was well within normal      limits at the time of the initial hospital assessment.  Treatment      with Lopressor was withheld for 24 hours.  Today, his blood      pressure has increased to the 150s systolically.  Therefore,      Lopressor will be restarted at 25 mg b.i.d.  6.  LEUKOCYTOSIS.  The patient's white blood cell count was 17.4 at the      time of the initial hospital assessment.  An urinalysis was      ordered.  Prior to antibiotic treatment, the patient's white blood      cell count decreased to 10.9 which is consistent with a reactive      leukocytosis.  However, in light of the results of the urinalysis      that just became available, the leukocytosis may have been in part      secondary to an urinary tract infection.  7. PYURIA CONSISTENT WITH URINARY TRACT INFECTION.  The patient's      urinalysis revealed moderate leukocytes and negative nitrites.  Because of his history of chronic urinary tract infections, the      patient will be started on Rocephin empirically today.  Upon      discharge, he will be treated with Ceftin 500 mg b.i.d. for a total      of 6 days.  8. TYPE 2 DIABETES MELLITUS.  The patient was treated with sliding      scale NovoLog during the hospitalization.  Metformin was      discontinued because of the metabolic acidosis.  His hemoglobin A1c      was noted to be 5.9.  Upon discharge, the patient will continue to      be treated with sliding scale NovoLog, and Lantus at bedtime will      be added as well.  9. HISTORY OF URINARY RETENTION/BPH:  The patient has an indwelling      Foley catheter currently.  He does have a history of urinary      retention.  Flomax was restarted during the hospitalization.  The      Foley catheter will be discontinued today, and we will continue to      monitor for urinary retention.      Elliot Cousin, M.D.  Electronically Signed     DF/MEDQ  D:  10/31/2007  T:  10/31/2007  Job:  [id number]   cc:   [facility]

## 2010-05-26 NOTE — Discharge Summary (Signed)
Derrick [name]              ACCOUNT NO.:  [id number]   MEDICAL RECORD NO.:  [id number]          PATIENT TYPE:  INP   LOCATION:  5527                         FACILITY:  [facility]   PHYSICIAN:  [name], M.D.DATE OF BIRTH:  July 07, 1945   DATE OF ADMISSION:  09/04/2007  DATE OF DISCHARGE:  10/11/2007                               DISCHARGE SUMMARY   ADDENDUM:   DISCHARGE DIAGNOSES:  Unchanged from dictated discharge summary  October 08, 2007.   DISCHARGE MEDICATIONS:  Unchanged from discharge summary dated October 08, 2007.   HOSPITAL COURSE:  Please note, this is a very brief addendum to the  previously dictated discharge summary on patient Derrick [name].  Mr.  [name]'s hospital stay was extended for 2 additional days solely due to  the fact that placement was required.  Placement was able to be located  for the patient with the plan being for the patient to be transferred  October 11, 2007.  No significant acute medical problems have been  encountered during this extended hospital stay and the patient has  remained fully stable.  Today, the patient has been examined, is  medically stable and is confirmed safe for discharge to his rehab/long-  term acute care facility.  There have been no changes made to his  previously dictated discharge medications and no significant additions  to his discharge diagnoses.      [name], M.D.  Electronically Signed     JTM/MEDQ  D:  10/11/2007  T:  10/11/2007  Job:  [id number]

## 2010-05-26 NOTE — Consult Note (Signed)
[name], BUFFALO NO.:  [id number]   MEDICAL RECORD NO.:  [id number]          PATIENT TYPE:  INP   LOCATION:  1237                         [facility]ACILITY:  [facility]   PHYSICIAN:  Juanetta Gosling, MDDATE OF BIRTH:  December 04, 1945   DATE OF CONSULTATION:  08/16/2007  DATE OF DISCHARGE:                                 CONSULTATION   REASON FOR CONSULTATION:  Abdominal distention, probable sigmoid  volvulus.   HISTORY:  [name] is a 65 year old male, admitted on 07/11/2007, with  an altered mental status.  He had a possible syncopal episode at home,  described as falling; he was unable to get up.  He has a recent cardiac  catheterization that is negative.  He has also been evaluated this  admission with a CVA workup and was negative with a CT,  MRI, carotid  Doppler and echocardiogram.  Also had acute renal failure on admission.  Since admission, this has been improving, but the last 24 hours, he has  had increasing abdominal distention.  A portable film at 2050 last night  showed an enlarged colon to 13 cm and NG was placed at this time.  A CT  following that, at 0220, showed massive colonic dilatation with a  possible sigmoid volvulus.   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  This was partially decompressed and a decompression  tube placed to the splenic flexure.  He is now recovering somewhat from  his sedation, although, per his primary physician, this is his baseline.   PAST MEDICAL HISTORY:  Includes congestive heart failure, acute renal  failure this admission, diabetes, gout, hypercholesterolemia,  hypertension.   PAST SURGICAL HISTORY:  None, per the notes.   MEDICATION:  Includes potassium, metformin, Lasix , simvastatin,  Lopressor and aspirin.   ALLERGIES:  He has no known drug allergies.   PHYSICAL EXAM:  VITAL SIGNS:   Temperature 98.5, pulse 73, blood pressure  139/97, respirations 26, and 96% on 2 L nasal cannula.  GENERAL:  He has an NG placed, draining a large amount of fluid.  ABDOMEN:  Distended.  He is nontender to my exam right now and he has  minimal bowel sounds.  There are no hernias that I can palpate either.   LABORATORY DATA:  His white count is 9.5, his hematocrit 34.5, platelets  279.  His potassium is 2.8, his BUN and creatinine are 42 and 1.75.  LFT  are 61, 46, 42 and 1.2, his PT 15.5, PTT 48, total bilirubin is 1.2.   ASSESSMENT:  Ogilvie syndrome.   PLAN:  His repeat film after decompression shows a cecum about 12 cm and  this is mildly improved.  Currently, he is nontender, although I think  he is at baseline, though he is not the best historian.  He had normal  white count and afebrile.  We plan to monitor for resolution with NG  tube and rectal decompression tube and a repeat KUB in the morning.  I  did discuss with Dr. Arthor Captain recommending correcting his electrolytes,  especially his potassium of 2.8,  today.  He is at risk of perforation unless his Ogilvie's is treated  aggressively, which will require surgical therapy.  Can also consider  neostigmine as therapy.  Due to his massive colonic dilatation, I  recommended this as long as he has no contraindications.  Thank you very  much for this consult.      Juanetta Gosling, MD  Electronically Signed     MCW/MEDQ  D:  08/16/2007  T:  08/16/2007  Job:  [id number]

## 2010-05-26 NOTE — Group Therapy Note (Signed)
[name], [name]              ACCOUNT NO.:  [id number]   MEDICAL RECORD NO.:  [id number]          PATIENT TYPE:  INP   LOCATION:  1237                         [facility]ACILITY:  [facility]   PHYSICIAN:  Michelene Gardener, MD    DATE OF BIRTH:  Mar 31, 1945                                 PROGRESS NOTE   CURRENT DIAGNOSIS:  1. Acute renal failure which is prerenal.  2. Sigmoid volvulus.  3. Hypokalemia.  4. Altered mental status.  5. Hypertension.  6. Hyperlipidemia.  7. Deconditioning.  8. Diabetes mellitus.  9. History of gout.  10.Obesity.   HOSPITAL CONSULTATIONS:  1. GI consult with [facility].  2. Surgical consult.   PROCEDURE:  None.  The patient is scheduled for a colonoscopy today.   RADIOLOGY STUDIES:  1. Chest x-ray on July 30 showed mild cardiomegaly with low lung      volumes.  2. CT scan of the head without contrast July 30 showed no acute      abnormalities with chronic sinusitis.  3. MRI of the brain July 30 that showed no acute infarct.  4. MRA of the brain July 30 showed normal findings with anatomic      variants.  5. Left wrist x-ray showed no acute findings.  6. Abdominal x-ray August 4 showed findings compatible with marked      colonic ileus, cecal distention.  7. CT scan of the abdomen without contrast August 5 showed marked      bilateral atelectasis with nasogastric tube in the stomach and      cholelithiasis.  8. CT scan of the pelvis without contrast August 5 showed findings      suspicious for sigmoid volvulus.  9. Chest x-ray on August 5 showed low lung volumes and bibasilar      atelectasis.   ASSESSMENT AND PLAN:  1. Altered mental status.  This patient was admitted initially with      altered mental status.  He was worked up for possible CVA.  CT scan      of the head was done and came to be normal.  MRI of the brain was      done and came to be normal.  MRA of the brain was done and came to      be normal.  An echocardiogram was done and it  showed ejection      fraction of 55-60% and showed no embolic evidence of stroke.      Carotid Doppler was done and it showed no internal carotid artery      stenosis and vertebral artery is antegrade.  His altered mental      status is thought to be secondary to dehydration with uremia and      progression of dementia.  Throughout the hospitalization the      patient improved and he is almost back to his baseline of mental      status.  He did not show any evidence of infection.  Dementia      workup is ordered including TSH, vitamin B12, folic acid and RPR.  2. Acute  renal failure.  His initial numbers are consistent with a      prerenal azotemia.  BUN/creatinine at the time of admission was      62/3.86.  He was hydrated with IV fluids and his numbers improved      to 28/1.69 August 2.  His fluids were decreased at that point and      then his numbers did start to increase again.  So today on August 5      his BUN/creatinine is 42/1.75.  His fluids were increased at 100 mL      an hour and we will continue to follow daily basic metabolic panel      on him.  3. Sigmoid volvulus.  During the night of August 4 he started to have      abdominal distention so x-ray was done and this showed marked      ileus.  CT scan of the abdomen and pelvis was also done and this      showed there was a sigmoid volvulus.  The patient was kept n.p.o.      G-tube was placed as well as rectal tube.  GI evaluation was      requested and he was seen by [facility] today and he will be taken      for decompressive colonoscopy.  Also [facility] was      consulted and consult is pending at this time.  The patient is      still n.p.o. and receiving IV fluids and pain medications as      needed.  Further management will be directed with GI and surgery.  4. Hypokalemia.  Potassium runs were given.  We will follow repeat      potassium and magnesium level.  5. Hypertension.  Blood pressure has been  controlled.  Currently he is      n.p.o. so his blood pressure medications were on hold.  I started      him on hydralazine to be given p.r.n.  He can be restarted back      onto his medications when he is able to take p.o.  6. Hyperlipidemia.  7. Diabetes mellitus.  Again, his sugars have been controlled during      time in the hospital.  We will continue to check his CBGs and cover      him with a sliding scale.  8. Deconditioning.  He was evaluated by PT and OT and he is      recommended for a skilled nursing facility.  Social worker is      working on it.  The family is in agreement though the patient is      still hesitant of going down there and that can be followed further      with the social worker.   Total assessment time is 40 minutes.      Michelene Gardener, MD  Electronically Signed     NAE/MEDQ  D:  08/16/2007  T:  08/16/2007  Job:  [id number]

## 2010-05-26 NOTE — Op Note (Signed)
NAMEDUFF, POZZI NO.:  [id number]   MEDICAL RECORD NO.:  [id number]          PATIENT TYPE:  INP   LOCATION:  2303                         FACILITY:  [facility]   PHYSICIAN:  Juanetta Gosling, MDDATE OF BIRTH:  1945/11/04   DATE OF PROCEDURE:  09/06/2007  DATE OF DISCHARGE:                               OPERATIVE REPORT   PREOPERATIVE DIAGNOSIS:  Left colon cancer and Ogilvie syndrome.   POSTOPERATIVE DIAGNOSIS:  Left colon cancer and Ogilvie syndrome.   PROCEDURE PERFORMED:  1. Exploratory laparotomy.  2. Lysis of adhesions for 30 minutes.  3. Subtotal colectomy.  4. End-ileostomy.  5. Mucous fistula.  6. Cholecystectomy.   SURGEON:  Juanetta Gosling, MD   ASSISTANT:  Almond Lint, MD and Dr. Cherylynn Ridges, MD   ESTIMATED BLOOD LOSS:  200 mL.   ANESTHESIA:  General endotracheal anesthesia.   SPECIMENS:  Colon and gallbladder to pathology.   COMPLICATIONS:  None.   DRAINS:  None.   DISPOSITION:  To PACU in stable condition.   INDICATIONS:  This is a 65 year old male who was admitted for mental  status changes who was also noted to have acute renal insufficiency  associated with abnormal electrolytes.  I was consulted for Ogilvie  syndrome or possible sigmoid volvulus.  He underwent a colonoscopy at  that time, which showed no evidence of sigmoid volvulus, but a very  significant colonic ileus.  He underwent electrolyte correction with  decompression tube and his Meriam Sprague was appearing to resolve.  This  worsened again and he underwent neostigmine treatment that resolved it  somewhat, but then, his Ogilvie returned yet again.  He underwent  another attempt at colonoscopy.  At this time, he was noted to have a  splenic flexure polyp that was biopsied and this showed adenocarcinoma.  His Ogilvie at that point was waxing and waning, I elected to start him  on TNA to improve his nutrition for approximately 7 days prior to  scheduling him for a  subtotal colectomy.  Prior to this, his LFTs  increased, which may have been from his TNA.  He did have stone disease  noted, so I also consented him through his sister to remove his  gallbladder at the same time.   PROCEDURE:  After informed consent was obtained from the sister, I do  not think the patient is able to provide informed consent, although I  did discuss the operation with him he was taken to the operating room.  Three grams of IV Unasyn was administered.  Sequential compression  devices were placed on his lower extremities throughout the operation.  He was placed under general endotracheal anesthesia without  complication.  His abdomen was then prepped and draped in the standard  sterile surgical fashion.  A midline incision was then made and  dissection carried out down to the level of the fascia.  Upon entering  the fascia, a large amount of bowel came out of the wound.  He did have  1 very small electrocautery injury of the small intestine, which I  repaired with multiple 3-0 silk stitches  to bury this area and it looked  clean without any leakage following this.  I also inspected it at the  end of the operation and it also still looked well.  His small bowel and  colon were massively dilated.  It was very difficult to mobilize his  colon and there were many adhesions from his colon to his omentum and to  the sidewall, even though he had no prior surgery.  His right colon was  first approached and taken.  The white line of Toldt was taken down and  dissected around his hepatic flexure.  The transverse colon was then  dissected free from the stomach using the LigaSure device.  The splenic  flexure was then approached where his cancer was and then freed from the  spleen without too much difficulty.  His sigmoid colon was noted to be  very redundant and looped back onto itself with a lot of adhesions  noted.  It was very difficult to rotate his colon up, but eventually, we   were able to do so after taking down the white line of Toldt on this  side as well.  Eventually, I also performed 4 colotomies after placing  pursestrings in there and decompressed his colon to make this portion of  the procedure easier.  His colon was still nonfunctional and very  clearly was never going to work again.  Upon completion of this, a  LigaSure device was used to then remove the colon at its mesentery.  Larger vessels were also clamped and tied as well as colon was taken  down.  The ileum was dissected free and a stapler was used to transect  the ileum approximately 6 inches from the colon and the ileocecal valve.  At the distal segment, his rectum was massively dilated all the way as  far as I could see past his peritoneal reflection.  GIA stapler was used  to come across his rectum as well.  The colon was then passed off the  table as a specimen.  As the gallbladder was then approached, wall was  thickened and he was noted to have stones in it.  Electrocautery was  used to dissect it free from the liver bed.  I did enter the gallbladder  twice on this with spillage of some bile and he did have small stones  throughout the gallbladder.  It was then removed from the liver bed.  Dissection of the triangle of Calot would identify the artery, which was  clipped and cut and the duct, which was clamped, cut, and then was tied  with a silk suture.  The gallbladder was then passed off the table.  Irrigation was performed.  Multiple points of raw surface bleeding were  controlled as well as several omental areas.  I elected to then give him  an end-ileostomy in his right lower quadrant.  An incision was then made  in the skin and carried out down to the fascia.  Cruciate incision was  made in his fascia and into the peritoneum.  Two fingers were then  passed through this and the ileum was brought easily through the  abdominal wall.  I elected to give him a mucous fistula on the other   side due to his atonic rectum as well, as I did not want him to  perforate his rectal stump.  In the left lower quadrant, an incision was  then made in the skin and dissected down to the fascia.  A cruciate  incision was then made and the corner of his massively dilated rectum  was then brought up into that wound.  Upon completion of this,  irrigation was performed.  Hemostasis was observed.  I then closed his  abdomen with zero looped PDS and stapled his skin.  The ileostomy was  then brought up,the end cut off, and matured with 3-0 Vicryl sutures.  Prior portion of the sigmoid colon was then brought up, corner was  removed, and this was matured with a 3-0 Vicryl suture as well.  Dressings were placed over all of these.  He did have several episodes  of tachycardia during the procedure  He did tolerate this well, was  extubated in the operating room, has an NG in good position, and will be  transferred to step-down overnight.      Juanetta Gosling, MD  Electronically Signed    MCW/MEDQ  D:  09/07/2007  T:  09/08/2007  Job:  [id number]

## 2010-05-26 NOTE — Group Therapy Note (Signed)
[name], [name]              ACCOUNT NO.:  [id number]   MEDICAL RECORD NO.:  [id number]          PATIENT TYPE:  INP   LOCATION:  1305                         FACILITY:  [facility]   PHYSICIAN:  Isidor Holts, M.D.  DATE OF BIRTH:  [birth year]                                 PROGRESS NOTE   DATE OF DISCHARGE:  To be determined.   PRIMARY CARE PHYSICIAN:  Annia Friendly. Hill, MD   DIAGNOSES:  1. Sigmoid volvulus, status post decompression by flexible      sigmoidoscopy on August 16, 2007.  2. Ogilvie`s syndrome.  3. Colonic adenocarcinoma. Confirmed by colonoscopy/biopsy.  4. Acute renal failure.  5. Dementia.  6. Hypertension.  7. Dyslipidemia.  8. Type 2 diabetes mellitus.  9. History of gout.  10.Obesity.  11.Severe protein calorie malnutrition.  12.Hypo proteinemic edema.  13.Anemia of chronic disease. Required 2 units of blood transfusion on      August 31, 2007.  14.Vitamin B12 deficiency.   DISCHARGE MEDICATIONS:  These will be listed in addendum at the appropriate time, by discharging  MD.   PROCEDURES:  1. Abdominal x-ray dated August 17, 2007.  This showed no significant      change and slight gaseous distention of the cecum.  2. Abdominal x-ray dated August 18, 2007.  This showed distal colon      probably been decompressed by a pigtail catheter extending to the      left upper quadrant, stable to mildly decreased gaseous distention      of the proximal colon.  No pneumoperitoneum.  NG tube in place.  3. Abdominal x-ray dated August 19, 2007.  This showed no feeding tube      identified.  Left-sided ureteral stent and NG tube stable position.      Mild colonic distention is unchanged.  4. Abdominal x-ray dated August 20, 2007.  This showed no significant      change in slight distention of the colon.  5. Abdominal x-ray dated August 21, 2007.  This showed persistent      dilated small and large bowel suggesting a diffuse ileus.  Low      colonic obstruction cannot  be excluded.  No free air.  6. Abdominal x-ray dated August 22, 2007.  This showed no change in      appearance of diffuse ileus.  No pneumoperitoneum.  7. Barium enema dated August 23, 2007.  This showed diffuse dilatation      of the colon with marked redundancy.  No evidence of sigmoid      volvulus or other obstructive lesions.  8. Abdominal x-ray dated August 24, 2007.  This showed marked diffuse      colonic dilatation most consistent with an ileus.  No evidence of      free peritoneal air.  9. Abdominal x-ray dated August 25, 2007.  This showed decreasing      gaseous distention of the small bowel persistent to distended loops      of large bowel compatible with ongoing but improving ileus.  10.Chest x-ray dated August 25, 2007.  This showed a  right PICC line      placement with tip overlying the lower SVC, improved basilar      atelectasis.  11.CT abdomen/pelvis dated August 26, 2007.  This showed left upper      quadrant and left pericolic gutter fluid noted.  Per report the      patient has recently diagnosed adenocarcinoma of the colon and this      may represent post biopsy change or may be reactive.  No free air      to suggest overt perforation.  Increased small bilateral pleural      effusions.  Gallstones.  Pelvic CT scan showed no acute intrapelvic      finding.  There was anasarca.   CONSULTATIONS:  1. Juanetta Gosling, MD, surgeon.  2. Barbette Hair. Arlyce Dice, MD,FACG, gastroenterologist.  3. Iva Boop, MD,FACG, gastroenterologist.   CLINICAL COURSE:  For details of admission history, refer to H&P notes of August 10, 2007,  dictated by Dr. [name].  In addition, for subsequent clinical  course from August 10, 2007, to August 16, 2007, refer to interval summary  dictated by Dr. Jannette Spanner on August 16, 2007.  For the period,  however, from August 17, 2007, to September 02, 2007, i.e. the date of this  dictation the following are pertinent:   1. Acute renal  failure.  This responded to judicious intravenous fluid      hydration, and as of the date of this dictation, had completely      resolved, with a BUN of 15 and creatinine of 1.30 on September 02, 2007.  The patient did experience fluid overload secondary to      intravenous fluid hydration, but this was readily addressed with      intravenous Lasix.  By September 01, 2007, BNP had normalized at 35.4.   1. Sigmoid volvulus.  This was addressed by flexible sigmoidoscopy      performed by Dr. Melvia Heaps, gastroenterologist, on August 16, 2007, with partial decompression.  Rectal tube was then inserted.      Subsequent to that, the patient developed colonic ileus and was      managed with continued rectal tube decompression as well as NG      tube.  Because of persistent ileus, surgical consultation was      called, which was kindly provided by Dr. Dwain Sarna who following      evaluation of the patient determined that the patient had Ogilvie`s      syndrome.  As colonic distention remained persistent, the patient      was subsequently managed with intravenous Neostigmine with some      clinical response.  The patient improved sufficiently for rectal      tube and NG tube to be discontinued, with no deleterious effects.   1. Colon adenocarcinoma.  Further GI input was kindly provided by Dr.      Stan Head, who reviewed imaging studies, continued to express      concern about persistent ileus, and finally performed a colonoscopy      on August 29, 2007.  This demonstrated pseudo-obstruction/ileus      decompressed, atypical polypoid lesion at the splenic flexure      suspicious for malignant disease.  Biopsy was done.  Subsequent      pathology confirmed invasive adenocarcinoma.  Per surgeon, surgery      is planned in the coming week.  1. Severe protein calorie malnutrition.  The patient was obviously      malnourished, with flaky skin, hypoalbuminemia and hypoalbuminemic       edema/anasarca.  Edema was addressed with intravenous Lasix.      Nutritionist was consulted, provided recommendations, and      parenteral TNA was commenced under the auspices of clinical      pharmacologist on August 30, 2007.   1. Vitamin B12 deficiency.  The patient was found to have markedly      diminished vitamin B12 level at 98, on August 15, 2007.  Folate was      reasonable at 3.6.  He has been commenced on appropriate vitamin      B12 supplementation.   1. Hypertension.  This was addressed with a combination of ACE      inhibitor and Lasix once renal failure had resolved.  The patient      has remained normotensive.   1. Anemia of chronic disease.  The patient was found to have an anemia      with an initial hemoglobin of 10.8 and an MCV of 85.1.  This was      likely a dimorphic picture, incorporating anemia of chronic disease      against a background of profound vitamin B12 deficiency.  The      patient's iron studies showed iron of 40, TIBC 175, percentage      saturation 23.  Ferritin was, however, markedly elevated at 1490.      Over the course of the patient's hospitalization, hemoglobin levels      drifted down to an all time low of 7.9 on August 31, 2007,      necessitating transfusion with 2 units PRBCs, resulting in a post      transfusion bump in hemoglobin to 10.8, on September 01, 2007.   1. Type 2 diabetes mellitus.  This has been controlled with sliding      scale insulin coverage.  The patient has remained euglycemic.   1. Dementia.  The patient is clearly demented.  Brain imaging studies      including MRI and head CT scan of August 10, 2007, showed global      atrophy with no hydrocephalus.  Although his mental status appears      to be at baseline as of August 31, 2007, the patient continues to      be pleasantly confused and demented, against a background of severe      vitamin B12 deficiency.  RPR was nonreactive.   1. Diarrhea.  The patient on September 02, 2007, was found to be in a      pool of liquid feces consistent with diarrhea.  In the course of      this hospitalization, he had had stools sent for C. difficile      toxin, which were persistently negative.  However, with his new      onset of diarrhea, it is important to rule out C. difficile toxin      given his relatively prolonged hospitalization.  Depending on      findings of subsequent stool studies, this will be addressed as      indicated.   DISPOSITION:  This will be elucidated in detail, in an addendum, at the appropriate  time, by discharging MD.  However, I have had a detailed discussion  regarding the patient's clinical condition with Dr. Dwain Sarna, surgeon,  and he has opined that the  patient will require surgery for definitive  management.  This is planned in the coming week, depending on the  patient's nutritional status and overall general condition.  It is  possible he may be transferred to [facility] for this  procedure.      Isidor Holts, M.D.  Electronically Signed     CO/MEDQ  D:  09/02/2007  T:  09/02/2007  Job:  [id number]   cc:   Annia Friendly. Loleta Chance, MD  Fax: [phone]

## 2010-05-26 NOTE — Group Therapy Note (Signed)
[name], [name] NO.:  [id number]   MEDICAL RECORD NO.:  [id number]          PATIENT TYPE:  INP   LOCATION:  5527                         FACILITY:  [facility]   PHYSICIAN:  Isidor Holts, M.D.  DATE OF BIRTH:  1945-12-06                                 PROGRESS NOTE   INTERIM SUMMARY   For discharge diagnoses, refer to interim summary dictated on September 02, 2007.   DISCHARGE MEDICATIONS:  To be listed in addendum at the appropriate time by discharging MD.   CLINICAL COURSE:  Patient underwent exploratory laparotomy with adhesiolysis, total  colectomy with end ileostomy and cholecystectomy, on September 06, 2007,  performed by Dr. Emelia Loron.  For the details of that procedure,  refer to operative note of September 06, 2007.  For subsequent clinical  course, refer to interim summary, dictated on September 12, 2007 by Dr.  Theodosia Paling, September 19, 2007 by Dr. [name], and once again, on  September 26, 2007, by Dr. Theodosia Paling.   For the subsequent period from September 27, 2007 to October 02, 2007,  i.e., the date of this dictation, the following are pertinent.  Patient's clinical condition has remained stable.  His diabetes is  controlled with sliding-scale insulin coverage, and he has remained  euglycemic.  Hypertension remains well controlled, and patient continues  on vitamin B12 supplements for vitamin B12 deficiency.  Renal function  has remained within normal limits.  As a matter of fact, BUN on  October 02, 2007 was 30 with a creatinine of 1.23.  He did on  October 01, 2007, have positive sediment, consistent with urinary  tract infection.  This was addressed with intravenous Rocephin.  As of  October 02, 2007, patient was on day #2 of a planned seven day course.  Attempts to remove patient's Foley catheter on September 30, 2007  resulted in urinary retention.  Foley catheter was therefore replaced.  Patient was placed on Flomax;  however, he has persistently declined p.o.  medication, making this route unreliable.   The burning issue continues to be one of nutritional status.  Patient's  oral intake is poor to nonexistent.  He has since the early days of his  hospitalization, been maintained on parenteral TNA.  Of course, this is  not a particularly long-term option, and enteral nutrition is to be  desired; however, he inconsistently manages small amounts of oral feeds  and persistently declines oral medication.   I had a detailed discussion with attending surgeon, Dr. Dwain Sarna, about  the way forward, and he agrees with me that the best solution would be  placement of a gastric tube, although there is always the risk that the  patient will attempt to yank this out.  However, the alternative will  mean discontinuation of parenteral TNA and utilization of comfort feeds  only, with the clear recognition that this likely will lead to continued  malnutrition, inanition, and subsequently starvation and decline.   I did have a discussion with family about this situation on October 01, 2007.  They vacillated somewhat about making a decision; therefore,  I have requested them to take some time to think about it and apprise me  of their concerns in due course.  Should family be agreeable,  interventional radiologist will be consulted to place a gastric tube  under radiologic guidance.  Chest x-ray of October 01, 2007 showed  cardiomegaly with baseline atelectasis.  No focal consolidation.   DISPOSITION:  This will be elucidated in detail in addendum at the appropriate time by  discharging MD; however, clearly, the patient will be unable to self-  care, will require total care, and therefore is a candidate for skilled  nursing facility; however, this will be pending resolution of feeding  issues.      Isidor Holts, M.D.  Electronically Signed     CO/MEDQ  D:  10/02/2007  T:  10/02/2007  Job:   [id number]   cc:   Annia Friendly. Loleta Chance, MD  Fax: [phone]

## 2010-05-26 NOTE — Discharge Summary (Signed)
NAMECLOYD, [name] NO.:  [id number]   MEDICAL RECORD NO.:  [id number]          PATIENT TYPE:  INP   LOCATION:  2109                         FACILITY:  [facility]   PHYSICIAN:  Theodosia Paling, MD    DATE OF BIRTH:  1945-04-02   DATE OF ADMISSION:  09/04/2007  DATE OF DISCHARGE:                               DISCHARGE SUMMARY   DATE OF DISCHARGE:  To be determined by primary care physician Dr.  [name].   INTERIM DISCHARGE SUMMARY:  Please  refer to the interim discharge  summary by Dr. Isidor Holts, dated September 02, 2007.  Since then,  following issues have transpired:  1. Ogilivie's syndrome.  The patient underwent surgery on September 06, 2007, for colonic obstruction. He has since then been on TPN.  His      surgery has been closely following the patient.  2. Respiratory distress.  This morning, the patient had an episode of      nausea and vomiting. He aspirated. He began to have respiratory      distress. He was tachypneic and appeared to be in pain. He received      some morphine 2 mg.  Subsequently, I was called that the patient      has got an altered mentation and for the respiratory distress and      hypoxia.  I evaluated the patient. I determined the patient needs      intubation.  I tried to contact the Power of Attorney and left a      message for her to contact me at my cell phone number.  However,      she did not get back to me.  The patient is getting transferred to      Intensive Care Unit for further evaluation and management of      respiratory distress.  3. Dementia.  For most time, the patient was non-verbal and had      fluctuating mentation.  However, on August 31, for the first time      he requested water and was relatively awake.  Otherwise, for the      most part, he was almost non-conversant.  4. Severe protein calorie malnutrition.  The patient was on TPN for      that.      Theodosia Paling, MD  Electronically  Signed     NP/MEDQ  D:  09/12/2007  T:  09/12/2007  Job:  [id number]   cc:   Annia Friendly. Loleta Chance, MD

## 2010-10-08 LAB — CARDIAC PANEL(CRET KIN+CKTOT+MB+TROPI)
CK, MB: 4.9 — ABNORMAL HIGH
Relative Index: 1.6
Relative Index: 1.9
Total CK: 256 — ABNORMAL HIGH
Troponin I: 0.02
Troponin I: 0.02

## 2010-10-08 LAB — BASIC METABOLIC PANEL
BUN: 10
BUN: 9
CO2: 30
Calcium: 8.8
Calcium: 8.8
Calcium: 9.5
Chloride: 97
Creatinine, Ser: 1.18
Creatinine, Ser: 1.23
GFR calc Af Amer: >60
GFR calc Af Amer: >60
GFR calc non Af Amer: 60 — ABNORMAL LOW
GFR calc non Af Amer: >60
Glucose, Bld: 98
Sodium: 138

## 2010-10-08 LAB — CBC
HCT: 34.6 — ABNORMAL LOW
Hemoglobin: 11.6 — ABNORMAL LOW
Hemoglobin: 12.2 — ABNORMAL LOW
MCHC: 32.6
MCHC: 32.7
MCHC: 32.8
MCHC: 33.5
MCHC: 33.7
MCV: 87.5
Platelets: 194
Platelets: 213
Platelets: 226
RBC: 4.23
RDW: 13.9
RDW: 14
RDW: 14.2
RDW: 14.3
RDW: 14.5

## 2010-10-08 LAB — HEMOGLOBIN A1C: Hgb A1c MFr Bld: 6.5 — ABNORMAL HIGH

## 2010-10-08 LAB — APTT: aPTT: 32

## 2010-10-08 LAB — COMPREHENSIVE METABOLIC PANEL
ALT: 15
AST: 25
Albumin: 3.4 — ABNORMAL LOW
Alkaline Phosphatase: 51
Calcium: 9
GFR calc Af Amer: >60
Glucose, Bld: 102 — ABNORMAL HIGH
Potassium: 3.3 — ABNORMAL LOW
Sodium: 138
Total Protein: 6.8

## 2010-10-08 LAB — LIPID PANEL
Cholesterol: 180
Triglycerides: 146

## 2010-10-08 LAB — PROTIME-INR: INR: 1

## 2010-10-08 LAB — RETICULOCYTES: Retic Ct Pct: 1.5

## 2010-10-08 LAB — FERRITIN: Ferritin: 158 (ref 22–322)

## 2010-10-08 LAB — IRON: Iron: 55

## 2010-10-09 LAB — BASIC METABOLIC PANEL
BUN: 50 — ABNORMAL HIGH
BUN: 15
BUN: 17
BUN: 23
BUN: 28 — ABNORMAL HIGH
BUN: 34 — ABNORMAL HIGH
BUN: 35 — ABNORMAL HIGH
BUN: 36 — ABNORMAL HIGH
BUN: 42 — ABNORMAL HIGH
CO2: 18 — ABNORMAL LOW
CO2: 20
CO2: 23
CO2: 27
CO2: 29
Calcium: 8.5
Calcium: 8.6
Calcium: 8.9
Calcium: 9.1
Calcium: 9.4
Calcium: 9.5
Calcium: 9.6
Chloride: 99
Chloride: 100
Chloride: 102
Chloride: 113 — ABNORMAL HIGH
Chloride: 115 — ABNORMAL HIGH
Chloride: 116 — ABNORMAL HIGH
Creatinine, Ser: 2.84 — ABNORMAL HIGH
Creatinine, Ser: 1.32
Creatinine, Ser: 1.34
Creatinine, Ser: 1.46
Creatinine, Ser: 1.51 — ABNORMAL HIGH
Creatinine, Ser: 1.61 — ABNORMAL HIGH
Creatinine, Ser: 1.74 — ABNORMAL HIGH
Creatinine, Ser: 1.75 — ABNORMAL HIGH
Creatinine, Ser: 2.14 — ABNORMAL HIGH
GFR calc Af Amer: 38 — ABNORMAL LOW
GFR calc Af Amer: 48 — ABNORMAL LOW
GFR calc Af Amer: 48 — ABNORMAL LOW
GFR calc Af Amer: 50 — ABNORMAL LOW
GFR calc Af Amer: 53 — ABNORMAL LOW
GFR calc Af Amer: >60
GFR calc Af Amer: >60
GFR calc non Af Amer: 40 — ABNORMAL LOW
GFR calc non Af Amer: 41 — ABNORMAL LOW
GFR calc non Af Amer: 44 — ABNORMAL LOW
GFR calc non Af Amer: 44 — ABNORMAL LOW
GFR calc non Af Amer: 45 — ABNORMAL LOW
GFR calc non Af Amer: 49 — ABNORMAL LOW
GFR calc non Af Amer: 51 — ABNORMAL LOW
GFR calc non Af Amer: 55 — ABNORMAL LOW
Glucose, Bld: 126 — ABNORMAL HIGH
Glucose, Bld: 133 — ABNORMAL HIGH
Glucose, Bld: 134 — ABNORMAL HIGH
Glucose, Bld: 144 — ABNORMAL HIGH
Glucose, Bld: 86
Glucose, Bld: 91
Glucose, Bld: 93
Glucose, Bld: 97
Potassium: 3.5
Potassium: 2.8 — ABNORMAL LOW
Potassium: 2.8 — ABNORMAL LOW
Potassium: 3.1 — ABNORMAL LOW
Potassium: 3.4 — ABNORMAL LOW
Potassium: 3.5
Sodium: 140
Sodium: 141
Sodium: 141
Sodium: 142

## 2010-10-09 LAB — CBC
HCT: 36.6 — ABNORMAL LOW
HCT: 36.8 — ABNORMAL LOW
HCT: 28.1 — ABNORMAL LOW
HCT: 28.5 — ABNORMAL LOW
HCT: 29.2 — ABNORMAL LOW
HCT: 32.7 — ABNORMAL LOW
HCT: 34.5 — ABNORMAL LOW
Hemoglobin: 11.9 — ABNORMAL LOW
Hemoglobin: 10.1 — ABNORMAL LOW
Hemoglobin: 10.8 — ABNORMAL LOW
Hemoglobin: 11.4 — ABNORMAL LOW
Hemoglobin: 9.2 — ABNORMAL LOW
Hemoglobin: 9.4 — ABNORMAL LOW
MCHC: 32.5
MCHC: 32.6
MCHC: 32.1
MCHC: 32.7
MCHC: 32.8
MCHC: 33
MCHC: 33.1
MCV: 86
MCV: 83.9
MCV: 84.9
MCV: 85.7
MCV: 85.8
Platelets: 194
Platelets: 197
Platelets: 271
Platelets: 284
RBC: 3.46 — ABNORMAL LOW
RBC: 3.48 — ABNORMAL LOW
RBC: 3.58 — ABNORMAL LOW
RBC: 3.85 — ABNORMAL LOW
RDW: 13.9
RDW: 14.4
RDW: 13.8
RDW: 14.6
RDW: 14.9
RDW: 14.9
RDW: 14.9
RDW: 15.2
WBC: 6.9
WBC: 5.6

## 2010-10-09 LAB — FOLATE: Folate: 3.6

## 2010-10-09 LAB — CLOSTRIDIUM DIFFICILE EIA
C difficile Toxins A+B, EIA: NEGATIVE
C difficile Toxins A+B, EIA: NEGATIVE

## 2010-10-09 LAB — GLUCOSE, CAPILLARY
Glucose-Capillary: 109 — ABNORMAL HIGH
Glucose-Capillary: 122 — ABNORMAL HIGH
Glucose-Capillary: 123 — ABNORMAL HIGH
Glucose-Capillary: 165 — ABNORMAL HIGH
Glucose-Capillary: 174 — ABNORMAL HIGH
Glucose-Capillary: 53 — ABNORMAL LOW
Glucose-Capillary: 100 — ABNORMAL HIGH
Glucose-Capillary: 104 — ABNORMAL HIGH
Glucose-Capillary: 110 — ABNORMAL HIGH
Glucose-Capillary: 117 — ABNORMAL HIGH
Glucose-Capillary: 122 — ABNORMAL HIGH
Glucose-Capillary: 124 — ABNORMAL HIGH
Glucose-Capillary: 126 — ABNORMAL HIGH
Glucose-Capillary: 126 — ABNORMAL HIGH
Glucose-Capillary: 127 — ABNORMAL HIGH
Glucose-Capillary: 132 — ABNORMAL HIGH
Glucose-Capillary: 134 — ABNORMAL HIGH
Glucose-Capillary: 135 — ABNORMAL HIGH
Glucose-Capillary: 145 — ABNORMAL HIGH
Glucose-Capillary: 147 — ABNORMAL HIGH
Glucose-Capillary: 150 — ABNORMAL HIGH
Glucose-Capillary: 152 — ABNORMAL HIGH
Glucose-Capillary: 23 — CL
Glucose-Capillary: 63 — ABNORMAL LOW
Glucose-Capillary: 71
Glucose-Capillary: 76
Glucose-Capillary: 82
Glucose-Capillary: 83
Glucose-Capillary: 83
Glucose-Capillary: 85
Glucose-Capillary: 88
Glucose-Capillary: 89
Glucose-Capillary: 91
Glucose-Capillary: 94
Glucose-Capillary: 96
Glucose-Capillary: 96

## 2010-10-09 LAB — URINALYSIS, ROUTINE W REFLEX MICROSCOPIC
Bilirubin Urine: NEGATIVE
Hgb urine dipstick: NEGATIVE
Nitrite: NEGATIVE
Protein, ur: 30 — AB
Specific Gravity, Urine: 1.014
Urobilinogen, UA: 1

## 2010-10-09 LAB — DIFFERENTIAL
Basophils Relative: 0
Lymphocytes Relative: 4 — ABNORMAL LOW
Lymphs Abs: 0.4 — ABNORMAL LOW
Monocytes Absolute: 1
Monocytes Relative: 12
Neutro Abs: 7.7
Neutrophils Relative %: 84 — ABNORMAL HIGH

## 2010-10-09 LAB — POCT CARDIAC MARKERS
CKMB, poc: 1.8
CKMB, poc: 2.5
Myoglobin, poc: >500
Troponin i, poc: <0.05

## 2010-10-09 LAB — POTASSIUM
Potassium: 2.8 — ABNORMAL LOW
Potassium: 3.1 — ABNORMAL LOW

## 2010-10-09 LAB — APTT: aPTT: 35

## 2010-10-09 LAB — HOMOCYSTEINE: Homocysteine: 108.7 — ABNORMAL HIGH

## 2010-10-09 LAB — PROTIME-INR
INR: 1.1
INR: 1.2
Prothrombin Time: 15.5 — ABNORMAL HIGH

## 2010-10-09 LAB — RETICULOCYTES: Retic Ct Pct: 0.9

## 2010-10-09 LAB — CARDIAC PANEL(CRET KIN+CKTOT+MB+TROPI)
CK, MB: 2.6
CK, MB: 3.2
Relative Index: 1
Relative Index: 1.3
Relative Index: INVALID
Total CK: 256 — ABNORMAL HIGH
Total CK: 79
Troponin I: 0.03

## 2010-10-09 LAB — HEPATITIS B SURFACE ANTIBODY,QUALITATIVE: Hep B S Ab: NEGATIVE

## 2010-10-09 LAB — CK TOTAL AND CKMB (NOT AT ARMC)
CK, MB: 1.3
Relative Index: 1
Total CK: 136

## 2010-10-09 LAB — CULTURE, BLOOD (ROUTINE X 2): Culture: NO GROWTH

## 2010-10-09 LAB — COMPREHENSIVE METABOLIC PANEL
Albumin: 3.7
Alkaline Phosphatase: 61
BUN: 62 — ABNORMAL HIGH
BUN: 40 — ABNORMAL HIGH
Calcium: 10.1
Creatinine, Ser: 3.86 — ABNORMAL HIGH
Glucose, Bld: 151 — ABNORMAL HIGH
Glucose, Bld: 147 — ABNORMAL HIGH
Potassium: 2.8 — ABNORMAL LOW
Total Protein: 7.4
Total Protein: 6.9

## 2010-10-09 LAB — LIPID PANEL
Cholesterol: 125
HDL: 44
Triglycerides: 128

## 2010-10-09 LAB — MAGNESIUM
Magnesium: 2.9 — ABNORMAL HIGH
Magnesium: 1.9
Magnesium: 2.2
Magnesium: 2.7 — ABNORMAL HIGH

## 2010-10-09 LAB — HEPATITIS C ANTIBODY: HCV Ab: NEGATIVE

## 2010-10-09 LAB — HEPATITIS B SURFACE ANTIGEN: Hepatitis B Surface Ag: NEGATIVE

## 2010-10-09 LAB — PHOSPHORUS: Phosphorus: 3.2

## 2010-10-09 LAB — URINE MICROSCOPIC-ADD ON

## 2010-10-09 LAB — TSH: TSH: 1.026

## 2010-10-09 LAB — HEMOGLOBIN A1C: Hgb A1c MFr Bld: 7.2 — ABNORMAL HIGH

## 2010-10-12 LAB — GLUCOSE, CAPILLARY
Glucose-Capillary: 105 — ABNORMAL HIGH
Glucose-Capillary: 107 — ABNORMAL HIGH
Glucose-Capillary: 109 — ABNORMAL HIGH
Glucose-Capillary: 110 — ABNORMAL HIGH
Glucose-Capillary: 110 — ABNORMAL HIGH
Glucose-Capillary: 111 — ABNORMAL HIGH
Glucose-Capillary: 112 — ABNORMAL HIGH
Glucose-Capillary: 113 — ABNORMAL HIGH
Glucose-Capillary: 114 — ABNORMAL HIGH
Glucose-Capillary: 114 — ABNORMAL HIGH
Glucose-Capillary: 115 — ABNORMAL HIGH
Glucose-Capillary: 117 — ABNORMAL HIGH
Glucose-Capillary: 118 — ABNORMAL HIGH
Glucose-Capillary: 118 — ABNORMAL HIGH
Glucose-Capillary: 122 — ABNORMAL HIGH
Glucose-Capillary: 122 — ABNORMAL HIGH
Glucose-Capillary: 123 — ABNORMAL HIGH
Glucose-Capillary: 123 — ABNORMAL HIGH
Glucose-Capillary: 123 — ABNORMAL HIGH
Glucose-Capillary: 124 — ABNORMAL HIGH
Glucose-Capillary: 124 — ABNORMAL HIGH
Glucose-Capillary: 125 — ABNORMAL HIGH
Glucose-Capillary: 126 — ABNORMAL HIGH
Glucose-Capillary: 126 — ABNORMAL HIGH
Glucose-Capillary: 127 — ABNORMAL HIGH
Glucose-Capillary: 127 — ABNORMAL HIGH
Glucose-Capillary: 127 — ABNORMAL HIGH
Glucose-Capillary: 128 — ABNORMAL HIGH
Glucose-Capillary: 128 — ABNORMAL HIGH
Glucose-Capillary: 129 — ABNORMAL HIGH
Glucose-Capillary: 130 — ABNORMAL HIGH
Glucose-Capillary: 130 — ABNORMAL HIGH
Glucose-Capillary: 130 — ABNORMAL HIGH
Glucose-Capillary: 131 — ABNORMAL HIGH
Glucose-Capillary: 131 — ABNORMAL HIGH
Glucose-Capillary: 131 — ABNORMAL HIGH
Glucose-Capillary: 134 — ABNORMAL HIGH
Glucose-Capillary: 135 — ABNORMAL HIGH
Glucose-Capillary: 140 — ABNORMAL HIGH
Glucose-Capillary: 143 — ABNORMAL HIGH
Glucose-Capillary: 145 — ABNORMAL HIGH
Glucose-Capillary: 146 — ABNORMAL HIGH
Glucose-Capillary: 146 — ABNORMAL HIGH
Glucose-Capillary: 148 — ABNORMAL HIGH
Glucose-Capillary: 148 — ABNORMAL HIGH
Glucose-Capillary: 60 — ABNORMAL LOW
Glucose-Capillary: 75
Glucose-Capillary: 97
Glucose-Capillary: 99
Glucose-Capillary: 99
Glucose-Capillary: 101 — ABNORMAL HIGH
Glucose-Capillary: 106 — ABNORMAL HIGH
Glucose-Capillary: 121 — ABNORMAL HIGH
Glucose-Capillary: 129 — ABNORMAL HIGH
Glucose-Capillary: 133 — ABNORMAL HIGH

## 2010-10-12 LAB — COMPREHENSIVE METABOLIC PANEL
ALT: 53
ALT: 90 — ABNORMAL HIGH
ALT: 34
Albumin: 1.8 — ABNORMAL LOW
Albumin: 1.9 — ABNORMAL LOW
Albumin: 3.4 — ABNORMAL LOW
Alkaline Phosphatase: 162 — ABNORMAL HIGH
Alkaline Phosphatase: 169 — ABNORMAL HIGH
Alkaline Phosphatase: 189 — ABNORMAL HIGH
Alkaline Phosphatase: 199 — ABNORMAL HIGH
BUN: 33 — ABNORMAL HIGH
BUN: 35 — ABNORMAL HIGH
BUN: 35 — ABNORMAL HIGH
CO2: 24
CO2: 28
Calcium: 8.9
Calcium: 9.8
Chloride: 103
Chloride: 108
Chloride: 109
Creatinine, Ser: 1.06
Creatinine, Ser: 1.29
GFR calc Af Amer: >60
GFR calc Af Amer: 54 — ABNORMAL LOW
Glucose, Bld: 119 — ABNORMAL HIGH
Glucose, Bld: 133 — ABNORMAL HIGH
Glucose, Bld: 113 — ABNORMAL HIGH
Potassium: 3.6
Potassium: 3.7
Potassium: 3.8
Potassium: 4.3
Potassium: 5.2 — ABNORMAL HIGH
Sodium: 133 — ABNORMAL LOW
Sodium: 135
Sodium: 145
Sodium: 128 — ABNORMAL LOW
Total Bilirubin: 1.3 — ABNORMAL HIGH
Total Bilirubin: 1.4 — ABNORMAL HIGH
Total Protein: 5.8 — ABNORMAL LOW
Total Protein: 6.2
Total Protein: 6.7
Total Protein: 6.7
Total Protein: 7.9

## 2010-10-12 LAB — BASIC METABOLIC PANEL
BUN: 34 — ABNORMAL HIGH
BUN: 37 — ABNORMAL HIGH
BUN: 38 — ABNORMAL HIGH
BUN: 40 — ABNORMAL HIGH
BUN: 24 — ABNORMAL HIGH
BUN: 25 — ABNORMAL HIGH
CO2: 21
CO2: 22
CO2: 23
CO2: 23
CO2: 23
CO2: 23
CO2: 30
Calcium: 8.4
Calcium: 8.6
Calcium: 8.7
Calcium: 8.8
Calcium: 8.7
Chloride: 101
Chloride: 102
Chloride: 103
Chloride: 100
Chloride: 100
Chloride: 105
Chloride: 99
Creatinine, Ser: 1.16
Creatinine, Ser: 1.26
Creatinine, Ser: 1.29
Creatinine, Ser: 1.31
Creatinine, Ser: 1.33
Creatinine, Ser: 1.1
GFR calc Af Amer: >60
GFR calc Af Amer: >60
GFR calc Af Amer: >60
GFR calc Af Amer: >60
GFR calc Af Amer: >60
GFR calc Af Amer: >60
GFR calc Af Amer: >60
GFR calc Af Amer: >60
GFR calc non Af Amer: 53 — ABNORMAL LOW
GFR calc non Af Amer: >60
GFR calc non Af Amer: >60
Glucose, Bld: 100 — ABNORMAL HIGH
Glucose, Bld: 139 — ABNORMAL HIGH
Potassium: 3.9
Potassium: 4.1
Potassium: 4.4
Potassium: 4.8
Sodium: 133 — ABNORMAL LOW
Sodium: 138
Sodium: 135
Sodium: 136

## 2010-10-12 LAB — CBC
HCT: 25.6 — ABNORMAL LOW
HCT: 25.6 — ABNORMAL LOW
HCT: 28.4 — ABNORMAL LOW
HCT: 28.9 — ABNORMAL LOW
HCT: 31.6 — ABNORMAL LOW
HCT: 32.4 — ABNORMAL LOW
Hemoglobin: 10.8 — ABNORMAL LOW
Hemoglobin: 8.6 — ABNORMAL LOW
Hemoglobin: 8.7 — ABNORMAL LOW
Hemoglobin: 8.8 — ABNORMAL LOW
Hemoglobin: 12.6 — ABNORMAL LOW
Hemoglobin: 8.9 — ABNORMAL LOW
MCHC: 33.8
MCHC: 33.8
MCHC: 34
MCHC: 34
MCHC: 34.1
MCHC: 34.4
MCHC: 32.8
MCHC: 33.6
MCHC: 33.6
MCV: 82.6
MCV: 83
MCV: 83.1
MCV: 83.3
MCV: 84.5
MCV: 83.4
MCV: 84
MCV: 84.8
Platelets: 180
Platelets: 186
Platelets: 191
Platelets: 205
Platelets: 272
Platelets: 152
Platelets: 219
RBC: 2.84 — ABNORMAL LOW
RBC: 2.98 — ABNORMAL LOW
RBC: 3.04 — ABNORMAL LOW
RBC: 3.07 — ABNORMAL LOW
RBC: 3.1 — ABNORMAL LOW
RBC: 3.11 — ABNORMAL LOW
RBC: 3.28 — ABNORMAL LOW
RBC: 3.32 — ABNORMAL LOW
RBC: 3.42 — ABNORMAL LOW
RBC: 3.16 — ABNORMAL LOW
RBC: 3.88 — ABNORMAL LOW
RDW: 15.9 — ABNORMAL HIGH
RDW: 15.9 — ABNORMAL HIGH
RDW: 16 — ABNORMAL HIGH
RDW: 16.1 — ABNORMAL HIGH
RDW: 16.3 — ABNORMAL HIGH
RDW: 16.8 — ABNORMAL HIGH
RDW: 16.7 — ABNORMAL HIGH
WBC: 5.4
WBC: 5.8
WBC: 5.8
WBC: 6.6
WBC: 7.8
WBC: 8.4
WBC: 10.9 — ABNORMAL HIGH
WBC: 6.8
WBC: 9.9

## 2010-10-12 LAB — DIFFERENTIAL
Basophils Relative: 0
Lymphs Abs: 1.1
Monocytes Absolute: 0.7
Monocytes Relative: 10
Neutro Abs: 5
Neutrophils Relative %: 74

## 2010-10-12 LAB — POCT CARDIAC MARKERS
CKMB, poc: 5.1
Myoglobin, poc: 402
Troponin i, poc: <0.05

## 2010-10-12 LAB — POCT I-STAT, CHEM 8
Chloride: 100
HCT: 40
Hemoglobin: 13.6
Potassium: 5.3 — ABNORMAL HIGH
Sodium: 127 — ABNORMAL LOW

## 2010-10-12 LAB — MAGNESIUM
Magnesium: 1.8
Magnesium: 1.9

## 2010-10-12 LAB — HEMOGLOBIN A1C
Hgb A1c MFr Bld: 5.9
Mean Plasma Glucose: 123
Mean Plasma Glucose: 128

## 2010-10-12 LAB — URINE MICROSCOPIC-ADD ON

## 2010-10-12 LAB — URINE CULTURE

## 2010-10-12 LAB — URINALYSIS, ROUTINE W REFLEX MICROSCOPIC
Bilirubin Urine: NEGATIVE
Glucose, UA: NEGATIVE
Ketones, ur: NEGATIVE
Nitrite: NEGATIVE
Protein, ur: NEGATIVE
Specific Gravity, Urine: 1.019
Urobilinogen, UA: 0.2
pH: 5

## 2010-10-12 LAB — PHOSPHORUS
Phosphorus: 3.1
Phosphorus: 3.7
Phosphorus: 5.1 — ABNORMAL HIGH

## 2010-10-12 LAB — ALBUMIN: Albumin: 2.2 — ABNORMAL LOW

## 2010-10-12 LAB — APTT: aPTT: 33

## 2010-10-12 LAB — PROTIME-INR
INR: 1.1
Prothrombin Time: 16.1 — ABNORMAL HIGH
Prothrombin Time: 14.3

## 2010-10-12 LAB — TRIGLYCERIDES: Triglycerides: 177 — ABNORMAL HIGH

## 2010-10-12 LAB — PREALBUMIN: Prealbumin: 9.8 — ABNORMAL LOW

## 2010-10-12 LAB — CROSSMATCH: ABO/RH(D): O POS

## 2010-10-12 LAB — CHOLESTEROL, TOTAL: Cholesterol: 102

## 2010-10-12 LAB — TSH: TSH: 2.099

## 2010-10-14 LAB — GLUCOSE, CAPILLARY
Glucose-Capillary: 100 — ABNORMAL HIGH
Glucose-Capillary: 102 — ABNORMAL HIGH
Glucose-Capillary: 103 — ABNORMAL HIGH
Glucose-Capillary: 105 — ABNORMAL HIGH
Glucose-Capillary: 105 — ABNORMAL HIGH
Glucose-Capillary: 107 — ABNORMAL HIGH
Glucose-Capillary: 107 — ABNORMAL HIGH
Glucose-Capillary: 108 — ABNORMAL HIGH
Glucose-Capillary: 110 — ABNORMAL HIGH
Glucose-Capillary: 110 — ABNORMAL HIGH
Glucose-Capillary: 115 — ABNORMAL HIGH
Glucose-Capillary: 116 — ABNORMAL HIGH
Glucose-Capillary: 116 — ABNORMAL HIGH
Glucose-Capillary: 121 — ABNORMAL HIGH
Glucose-Capillary: 122 — ABNORMAL HIGH
Glucose-Capillary: 125 — ABNORMAL HIGH
Glucose-Capillary: 126 — ABNORMAL HIGH
Glucose-Capillary: 130 — ABNORMAL HIGH
Glucose-Capillary: 131 — ABNORMAL HIGH
Glucose-Capillary: 132 — ABNORMAL HIGH
Glucose-Capillary: 135 — ABNORMAL HIGH
Glucose-Capillary: 136 — ABNORMAL HIGH
Glucose-Capillary: 143 — ABNORMAL HIGH
Glucose-Capillary: 145 — ABNORMAL HIGH
Glucose-Capillary: 146 — ABNORMAL HIGH
Glucose-Capillary: 147 — ABNORMAL HIGH
Glucose-Capillary: 152 — ABNORMAL HIGH
Glucose-Capillary: 154 — ABNORMAL HIGH
Glucose-Capillary: 167 — ABNORMAL HIGH
Glucose-Capillary: 172 — ABNORMAL HIGH
Glucose-Capillary: 211 — ABNORMAL HIGH
Glucose-Capillary: 223 — ABNORMAL HIGH
Glucose-Capillary: 38 — CL
Glucose-Capillary: 52 — ABNORMAL LOW
Glucose-Capillary: 59 — ABNORMAL LOW
Glucose-Capillary: 63 — ABNORMAL LOW
Glucose-Capillary: 66 — ABNORMAL LOW
Glucose-Capillary: 66 — ABNORMAL LOW
Glucose-Capillary: 73
Glucose-Capillary: 76
Glucose-Capillary: 76
Glucose-Capillary: 77
Glucose-Capillary: 80
Glucose-Capillary: 84
Glucose-Capillary: 88
Glucose-Capillary: 88
Glucose-Capillary: 89
Glucose-Capillary: 89
Glucose-Capillary: 94
Glucose-Capillary: 95
Glucose-Capillary: 95
Glucose-Capillary: 95
Glucose-Capillary: 99

## 2010-10-14 LAB — COMPREHENSIVE METABOLIC PANEL
ALT: 36
ALT: 49
AST: 29
AST: 30
AST: 33
AST: 57 — ABNORMAL HIGH
Albumin: 1.5 — ABNORMAL LOW
Albumin: 1.5 — ABNORMAL LOW
Albumin: 1.9 — ABNORMAL LOW
Alkaline Phosphatase: 223 — ABNORMAL HIGH
BUN: 52 — ABNORMAL HIGH
CO2: 27
CO2: 28
Calcium: 8.1 — ABNORMAL LOW
Calcium: 8.4
Calcium: 8.5
Calcium: 8.7
Creatinine, Ser: 1.66 — ABNORMAL HIGH
Creatinine, Ser: 1.99 — ABNORMAL HIGH
Creatinine, Ser: 2.16 — ABNORMAL HIGH
GFR calc Af Amer: 33 — ABNORMAL LOW
GFR calc Af Amer: 38 — ABNORMAL LOW
GFR calc Af Amer: 41 — ABNORMAL LOW
GFR calc Af Amer: 51 — ABNORMAL LOW
GFR calc Af Amer: >60
GFR calc non Af Amer: 31 — ABNORMAL LOW
GFR calc non Af Amer: 34 — ABNORMAL LOW
GFR calc non Af Amer: 42 — ABNORMAL LOW
Glucose, Bld: 66 — ABNORMAL LOW
Glucose, Bld: 88
Potassium: 4.5
Sodium: 135
Sodium: 142
Total Protein: 5.6 — ABNORMAL LOW
Total Protein: 5.7 — ABNORMAL LOW
Total Protein: 6.5
Total Protein: 7.1

## 2010-10-14 LAB — CULTURE, BLOOD (ROUTINE X 2)
Culture: NO GROWTH
Culture: NO GROWTH

## 2010-10-14 LAB — BASIC METABOLIC PANEL
BUN: 38 — ABNORMAL HIGH
BUN: 40 — ABNORMAL HIGH
BUN: 40 — ABNORMAL HIGH
BUN: 45 — ABNORMAL HIGH
CO2: 24
CO2: 26
CO2: 28
Calcium: 8.3 — ABNORMAL LOW
Calcium: 8.5
Calcium: 8.9
Chloride: 105
Chloride: 106
Chloride: 106
Chloride: 109
Chloride: 113 — ABNORMAL HIGH
Creatinine, Ser: 1.52 — ABNORMAL HIGH
Creatinine, Ser: 1.58 — ABNORMAL HIGH
Creatinine, Ser: 1.66 — ABNORMAL HIGH
GFR calc Af Amer: 54 — ABNORMAL LOW
GFR calc Af Amer: 57 — ABNORMAL LOW
GFR calc non Af Amer: 47 — ABNORMAL LOW
Glucose, Bld: 138 — ABNORMAL HIGH
Glucose, Bld: 141 — ABNORMAL HIGH
Glucose, Bld: 143 — ABNORMAL HIGH
Glucose, Bld: 148 — ABNORMAL HIGH
Potassium: 3.5
Potassium: 3.5
Potassium: 4.2
Potassium: 4.7
Sodium: 140
Sodium: 141
Sodium: 146 — ABNORMAL HIGH

## 2010-10-14 LAB — CBC
HCT: 24.8 — ABNORMAL LOW
HCT: 25.8 — ABNORMAL LOW
HCT: 26.1 — ABNORMAL LOW
HCT: 26.4 — ABNORMAL LOW
HCT: 26.4 — ABNORMAL LOW
HCT: 27.5 — ABNORMAL LOW
HCT: 30 — ABNORMAL LOW
Hemoglobin: 10 — ABNORMAL LOW
Hemoglobin: 7.8 — CL
Hemoglobin: 8.6 — ABNORMAL LOW
Hemoglobin: 8.6 — ABNORMAL LOW
Hemoglobin: 8.8 — ABNORMAL LOW
Hemoglobin: 9.2 — ABNORMAL LOW
MCHC: 33.2
MCHC: 33.3
MCHC: 33.3
MCHC: 33.4
MCHC: 33.4
MCHC: 33.6
MCHC: 33.7
MCHC: 34.1
MCV: 84
MCV: 84.3
MCV: 84.5
MCV: 84.6
MCV: 84.7
MCV: 85
MCV: 85
MCV: 85.1
MCV: 85.7
Platelets: 234
Platelets: 247
Platelets: 249
Platelets: 320
Platelets: 320
Platelets: 326
Platelets: 341
Platelets: 367
Platelets: 384
RBC: 2.79 — ABNORMAL LOW
RBC: 3.06 — ABNORMAL LOW
RBC: 3.5 — ABNORMAL LOW
RDW: 16.2 — ABNORMAL HIGH
RDW: 16.2 — ABNORMAL HIGH
RDW: 16.3 — ABNORMAL HIGH
RDW: 16.8 — ABNORMAL HIGH
RDW: 17.5 — ABNORMAL HIGH
RDW: 17.8 — ABNORMAL HIGH
RDW: 18.1 — ABNORMAL HIGH
RDW: 18.3 — ABNORMAL HIGH
WBC: 10
WBC: 13.2 — ABNORMAL HIGH
WBC: 17.4 — ABNORMAL HIGH
WBC: 8.7
WBC: 9.2

## 2010-10-14 LAB — CARBOXYHEMOGLOBIN
Carboxyhemoglobin: 0.5
Carboxyhemoglobin: 1
Carboxyhemoglobin: 1.1
Methemoglobin: 0.6
Methemoglobin: 0.9
Methemoglobin: 0.9
O2 Saturation: 63.9
O2 Saturation: 70.2
O2 Saturation: 73.9
O2 Saturation: 74
Total hemoglobin: 13.1 — ABNORMAL LOW
Total hemoglobin: 7 — CL
Total hemoglobin: 9.1 — ABNORMAL LOW

## 2010-10-14 LAB — MAGNESIUM
Magnesium: 2.4
Magnesium: 2.5

## 2010-10-14 LAB — HEPATIC FUNCTION PANEL
ALT: 37
AST: 29
AST: 39 — ABNORMAL HIGH
Albumin: 1.7 — ABNORMAL LOW
Albumin: 1.8 — ABNORMAL LOW
Alkaline Phosphatase: 245 — ABNORMAL HIGH
Bilirubin, Direct: 1.5 — ABNORMAL HIGH
Indirect Bilirubin: 0.9
Indirect Bilirubin: 1.4 — ABNORMAL HIGH
Total Bilirubin: 2.4 — ABNORMAL HIGH
Total Bilirubin: 3.2 — ABNORMAL HIGH
Total Protein: 6.4
Total Protein: 6.7

## 2010-10-14 LAB — URINE CULTURE
Colony Count: >=100000
Colony Count: NO GROWTH
Culture: NO GROWTH

## 2010-10-14 LAB — CULTURE, BAL-QUANTITATIVE W GRAM STAIN
Colony Count: NO GROWTH
Culture: NO GROWTH
Gram Stain: NONE SEEN

## 2010-10-14 LAB — PHOSPHORUS
Phosphorus: 2.7
Phosphorus: 3.4
Phosphorus: 3.7
Phosphorus: 3.7
Phosphorus: 3.8
Phosphorus: 3.8
Phosphorus: 4.1

## 2010-10-14 LAB — COMPREHENSIVE METABOLIC PANEL WITH GFR
ALT: 81 — ABNORMAL HIGH
AST: 52 — ABNORMAL HIGH
Albumin: 1.7 — ABNORMAL LOW
Alkaline Phosphatase: 202 — ABNORMAL HIGH
BUN: 44 — ABNORMAL HIGH
CO2: 32
Calcium: 8.8
Chloride: 103
Creatinine, Ser: 2.28 — ABNORMAL HIGH
GFR calc Af Amer: 35 — ABNORMAL LOW
GFR calc non Af Amer: 29 — ABNORMAL LOW
Glucose, Bld: 123 — ABNORMAL HIGH
Potassium: 4.1
Sodium: 148 — ABNORMAL HIGH
Total Bilirubin: 4.4 — ABNORMAL HIGH
Total Protein: 6

## 2010-10-14 LAB — PREALBUMIN: Prealbumin: 10.7 — ABNORMAL LOW

## 2010-10-14 LAB — URINALYSIS, ROUTINE W REFLEX MICROSCOPIC
Glucose, UA: NEGATIVE
Ketones, ur: 15 — AB
Nitrite: POSITIVE — AB
Protein, ur: 100 — AB
Specific Gravity, Urine: 1.021
Urobilinogen, UA: 0.2
pH: 5.5

## 2010-10-14 LAB — POCT I-STAT 3, ART BLOOD GAS (G3+)
Acid-Base Excess: 5 — ABNORMAL HIGH
Bicarbonate: 27.5 — ABNORMAL HIGH
O2 Saturation: 98
TCO2: 29
pCO2 arterial: 33.8 — ABNORMAL LOW
pO2, Arterial: 99

## 2010-10-14 LAB — PROTIME-INR: Prothrombin Time: 17.1 — ABNORMAL HIGH

## 2010-10-14 LAB — TRIGLYCERIDES
Triglycerides: 146
Triglycerides: 212 — ABNORMAL HIGH

## 2010-10-14 LAB — CARDIAC PANEL(CRET KIN+CKTOT+MB+TROPI)
CK, MB: 3.9
Relative Index: 3 — ABNORMAL HIGH
Relative Index: INVALID
Total CK: 131
Total CK: 64
Troponin I: 0.14 — ABNORMAL HIGH
Troponin I: 0.21 — ABNORMAL HIGH

## 2010-10-14 LAB — DIFFERENTIAL
Eosinophils Absolute: 0
Lymphs Abs: 1
Monocytes Relative: 15 — ABNORMAL HIGH
Neutro Abs: 6
Neutrophils Relative %: 73

## 2010-10-14 LAB — FUNGUS CULTURE, BLOOD: Culture: NO GROWTH

## 2010-10-14 LAB — URINE MICROSCOPIC-ADD ON

## 2010-10-14 LAB — APTT: aPTT: 42 — ABNORMAL HIGH

## 2010-10-14 LAB — CORTISOL: Cortisol, Plasma: 30.3

## 2010-10-14 LAB — LEGIONELLA ANTIGEN, URINE: Legionella Antigen, Urine: NEGATIVE

## 2010-10-14 LAB — CHOLESTEROL, TOTAL: Cholesterol: 129

## 2010-10-14 LAB — CROSSMATCH
ABO/RH(D): O POS
Antibody Screen: NEGATIVE

## 2010-10-14 LAB — CATH TIP CULTURE: Culture: NO GROWTH

## 2010-10-14 LAB — STREP PNEUMONIAE URINARY ANTIGEN: Strep Pneumo Urinary Antigen: NEGATIVE

## 2012-04-17 ENCOUNTER — Non-Acute Institutional Stay (SKILLED_NURSING_FACILITY): Payer: Medicare Other | Admitting: Internal Medicine

## 2012-04-17 DIAGNOSIS — N179 Acute kidney failure, unspecified: Secondary | ICD-10-CM

## 2012-04-17 DIAGNOSIS — D631 Anemia in chronic kidney disease: Secondary | ICD-10-CM

## 2012-05-01 NOTE — Progress Notes (Signed)
Patient ID: [name], male   DOB: 23-Jan-1945, 67 y.o.   MRN: [id number]        PROGRESS NOTE  DATE:  04/17/2012  FACILITY: [facility]   LEVEL OF CARE: SNF  Acute Visit  CHIEF COMPLAINT:  Manage anemia of chronic kidney disease and chronic kidney disease.    HISTORY OF PRESENT ILLNESS: I was requested by the staff to assess the patient regarding above problem(s):  ANEMIA: The anemia is unstable. Staff denies fatigue, melena or hematochezia. No complications from the medications currently being used.  On 04/14/2012:  Hemoglobin 9.8, MCV 81.  In 10/2011:  Hemoglobin 10.2.  Patient is a poor historian.    CHRONIC KIDNEY DISEASE: The patient's chronic kidney disease is unstable.  Staff denies increasing lower extremity swelling or confusion. Last BUN and creatinine are:  On 04/14/2012:  Creatinine 1.61.  In 10/2011:  Creatinine 1.42.  Patient has  chronic lower extremity swelling.    PAST MEDICAL HISTORY : Reviewed.  No changes.  CURRENT MEDICATIONS: Reviewed per [facility]  REVIEW OF SYSTEMS:  Unobtainable.  Patient is a poor historian.   PHYSICAL EXAMINATION  GENERAL: no acute distress, normal body habitus EYES: conjunctivae normal, sclerae normal, normal eye lids NECK: supple, trachea midline, no neck masses, no thyroid tenderness, no thyromegaly LYMPHATICS: no LAN in the neck, no supraclavicular LAN RESPIRATORY: breathing is even & unlabored, BS CTAB CARDIAC: RRR, no murmur,no extra heart sounds EDEMA/VARICOSITIES:  +2 bilateral lower extremity edema  ARTERIAL:  pedal pulses nonpalpable  GI: abdomen soft, normal BS, no masses, no tenderness, no hepatomegaly, no splenomegaly PSYCHIATRIC: the patient is alert & oriented to person, affect & behavior appropriate  ASSESSMENT/PLAN:  Anemia of chronic kidney disease.  Unstable problem.  Hemoglobin declined.  Reassess in one week.  Continue iron.   Acute renal failure.   Unstable problem.  Renal functions are worse.  Reassess  creatinine level in one week.     CPT CODE: 40981

## 2012-05-15 ENCOUNTER — Non-Acute Institutional Stay (SKILLED_NURSING_FACILITY): Payer: Medicare Other | Admitting: Internal Medicine

## 2012-05-15 DIAGNOSIS — E1129 Type 2 diabetes mellitus with other diabetic kidney complication: Secondary | ICD-10-CM

## 2012-05-15 DIAGNOSIS — I150 Renovascular hypertension: Secondary | ICD-10-CM

## 2012-05-15 DIAGNOSIS — D631 Anemia in chronic kidney disease: Secondary | ICD-10-CM

## 2012-05-15 DIAGNOSIS — G309 Alzheimer's disease, unspecified: Secondary | ICD-10-CM

## 2012-05-15 DIAGNOSIS — F0280 Dementia in other diseases classified elsewhere without behavioral disturbance: Secondary | ICD-10-CM

## 2012-06-01 DIAGNOSIS — D631 Anemia in chronic kidney disease: Secondary | ICD-10-CM | POA: Insufficient documentation

## 2012-06-01 DIAGNOSIS — I150 Renovascular hypertension: Secondary | ICD-10-CM | POA: Insufficient documentation

## 2012-06-01 DIAGNOSIS — E119 Type 2 diabetes mellitus without complications: Secondary | ICD-10-CM | POA: Insufficient documentation

## 2012-06-01 NOTE — Progress Notes (Signed)
Patient ID: [name], male   DOB: November 10, 1945, 67 y.o.   MRN: [id number]        PROGRESS NOTE  DATE: 05/15/2012  FACILITY: Nursing Home Location: [facility]  LEVEL OF CARE: SNF (31)  Routine Visit  CHIEF COMPLAINT:  Manage anemia of chronic kidney disease, diabetes mellitus, and dementia.    HISTORY OF PRESENT ILLNESS:  REASSESSMENT OF ONGOING PROBLEM(S):  DEMENTIA: The dementia remains stable and continues to function adequately in the current living environment with supervision.  The patient has had little changes in behavior. No complications noted from the medications presently being used.  Patient is a poor historian.    ANEMIA: The anemia has been stable. The patient denies fatigue, melena or hematochezia. No complications from the medications currently being used.  The anemia is secondary to chronic kidney disease.  On 05/09/2012, hemoglobin was 9.9, MCV 80.  On 04/24/2012, hemoglobin was 9.8.  DM:pt's DM remains stable.  Pt denies polyuria, polydipsia, polyphagia, changes in vision or hypoglycemic episodes.  No complications noted from the medication presently being used.  Last hemoglobin A1c is:  04/2012:  Hemoglobin A1C 6.7.  PAST MEDICAL HISTORY : Reviewed.  No changes.  CURRENT MEDICATIONS: Reviewed per [facility]  REVIEW OF SYSTEMS:  Difficult to obtain due to dementia.    PHYSICAL EXAMINATION  VS:  T 98.8      P 92     RR 18    BP 138/70     POX %     WT (Lb) 225  GENERAL: no acute distress, normal body habitus EYES: conjunctivae normal, sclerae normal, normal eye lids NECK: supple, trachea midline, no neck masses, no thyroid tenderness, no thyromegaly LYMPHATICS: no LAN in the neck, no supraclavicular LAN RESPIRATORY: breathing is even & unlabored, BS CTAB CARDIAC: RRR, no murmur,no extra heart sounds EDEMA/VARICOSITIES:  +1 bilateral lower extremity edema  ARTERIAL: pedal pulses nonpalpable  GI: abdomen soft, normal BS, no masses, no  tenderness, no hepatomegaly, no splenomegaly PSYCHIATRIC: the patient is alert & oriented to person, affect & behavior appropriate  LABS/RADIOLOGY: 04/2012:  Hemoglobin 9.9, MCV 80, otherwise CBC normal.    Creatinine 1.62, otherwise CMP normal.    Fasting lipid panel normal.    ASSESSMENT/PLAN:  Diabetes mellitus with renal complications.  Well controlled.  Anemia of chronic kidney disease.  Stable.   Dementia.  Moderately advanced.    Renovascular hypertension.  Well controlled.    Hyperlipidemia.  Well controlled.    BPH.  Continue Flomax.    Insomnia.  Continue trazodone.    Vitamin B12 deficiency.  Continue supplementation.   CPT CODE: 56213

## 2012-06-19 ENCOUNTER — Non-Acute Institutional Stay (SKILLED_NURSING_FACILITY): Payer: Medicare Other | Admitting: Internal Medicine

## 2012-06-19 DIAGNOSIS — I150 Renovascular hypertension: Secondary | ICD-10-CM

## 2012-06-19 DIAGNOSIS — E1129 Type 2 diabetes mellitus with other diabetic kidney complication: Secondary | ICD-10-CM

## 2012-06-19 DIAGNOSIS — G309 Alzheimer's disease, unspecified: Secondary | ICD-10-CM

## 2012-06-19 DIAGNOSIS — D631 Anemia in chronic kidney disease: Secondary | ICD-10-CM

## 2012-06-20 NOTE — Progress Notes (Signed)
Patient ID: [name], male   DOB: [birth year], 67 y.o.   MRN: [id number]        PROGRESS NOTE  DATE: 06/19/2012  FACILITY: Nursing Home Location: [facility]  LEVEL OF CARE: SNF (31)  Routine Visit  CHIEF COMPLAINT:  Manage anemia of chronic kidney disease, diabetes mellitus, and dementia.    HISTORY OF PRESENT ILLNESS:  REASSESSMENT OF ONGOING PROBLEM(S):  DEMENTIA: The dementia remains stable and continues to function adequately in the current living environment with supervision.  The patient has had little changes in behavior. No complications noted from the medications presently being used.  Patient is a poor historian.    ANEMIA: The anemia has been stable. The patient denies fatigue, melena or hematochezia. No complications from the medications currently being used.  The anemia is secondary to chronic kidney disease.  On 05/09/2012, hemoglobin was 9.9, MCV 80.  On 04/24/2012, hemoglobin was 9.8.  DM:pt's DM remains stable.  Pt denies polyuria, polydipsia, polyphagia, changes in vision or hypoglycemic episodes.  No complications noted from the medication presently being used.  Last hemoglobin A1c is:  04/2012:  Hemoglobin A1C 6.7.  PAST MEDICAL HISTORY : Reviewed.  No changes.  CURRENT MEDICATIONS: Reviewed per [facility]  REVIEW OF SYSTEMS:  Difficult to obtain due to dementia.    PHYSICAL EXAMINATION  VS:  T 98.3      P 64     RR 24    BP 167/77     POX %     WT (Lb) 220  GENERAL: no acute distress, normal body habitus NECK: supple, trachea midline, no neck masses, no thyroid tenderness, no thyromegaly RESPIRATORY: breathing is even & unlabored, BS CTAB CARDIAC: RRR, no murmur,no extra heart sounds EDEMA/VARICOSITIES:  +1 bilateral lower extremity edema  ARTERIAL: pedal pulses nonpalpable  GI: abdomen soft, normal BS, no masses, no tenderness, no hepatomegaly, no splenomegaly PSYCHIATRIC: the patient is alert & oriented to person, affect & behavior  appropriate  LABS/RADIOLOGY: 04/2012:  Hemoglobin 9.9, MCV 80, otherwise CBC normal.    Creatinine 1.62, otherwise CMP normal.    Fasting lipid panel normal.    ASSESSMENT/PLAN:  Diabetes mellitus with renal complications.  Well controlled.  Anemia of chronic kidney disease.  Stable.   Dementia.  Moderately advanced.    Renovascular hypertension. Last BP elevated. Review a BP log.  Hyperlipidemia.  Well controlled.    BPH.  Continue Flomax.    Insomnia.  Continue trazodone.    Vitamin B12 deficiency.  Continue supplementation.   CPT CODE: 40981

## 2012-07-03 ENCOUNTER — Non-Acute Institutional Stay (SKILLED_NURSING_FACILITY): Payer: Medicare Other | Admitting: Internal Medicine

## 2012-07-03 ENCOUNTER — Emergency Department (HOSPITAL_COMMUNITY): Payer: Medicare Other

## 2012-07-03 ENCOUNTER — Inpatient Hospital Stay (HOSPITAL_COMMUNITY)
Admission: EM | Admit: 2012-07-03 | Discharge: 2012-07-14 | DRG: 682 | Disposition: A | Discharge status: Skilled Nursing Facility | Payer: Medicare Other | Attending: Internal Medicine | Admitting: Internal Medicine

## 2012-07-03 ENCOUNTER — Encounter (HOSPITAL_COMMUNITY): Payer: Self-pay | Admitting: Emergency Medicine

## 2012-07-03 DIAGNOSIS — L02419 Cutaneous abscess of limb, unspecified: Secondary | ICD-10-CM | POA: Diagnosis present

## 2012-07-03 DIAGNOSIS — Z515 Encounter for palliative care: Secondary | ICD-10-CM

## 2012-07-03 DIAGNOSIS — D631 Anemia in chronic kidney disease: Secondary | ICD-10-CM | POA: Diagnosis present

## 2012-07-03 DIAGNOSIS — I129 Hypertensive chronic kidney disease with stage 1 through stage 4 chronic kidney disease, or unspecified chronic kidney disease: Secondary | ICD-10-CM | POA: Diagnosis present

## 2012-07-03 DIAGNOSIS — I150 Renovascular hypertension: Secondary | ICD-10-CM

## 2012-07-03 DIAGNOSIS — L0390 Cellulitis, unspecified: Secondary | ICD-10-CM

## 2012-07-03 DIAGNOSIS — R1319 Other dysphagia: Secondary | ICD-10-CM

## 2012-07-03 DIAGNOSIS — E1129 Type 2 diabetes mellitus with other diabetic kidney complication: Secondary | ICD-10-CM

## 2012-07-03 DIAGNOSIS — R633 Feeding difficulties, unspecified: Secondary | ICD-10-CM | POA: Diagnosis present

## 2012-07-03 DIAGNOSIS — L03119 Cellulitis of unspecified part of limb: Secondary | ICD-10-CM | POA: Diagnosis present

## 2012-07-03 DIAGNOSIS — A498 Other bacterial infections of unspecified site: Secondary | ICD-10-CM | POA: Diagnosis present

## 2012-07-03 DIAGNOSIS — L97509 Non-pressure chronic ulcer of other part of unspecified foot with unspecified severity: Secondary | ICD-10-CM | POA: Diagnosis present

## 2012-07-03 DIAGNOSIS — N390 Urinary tract infection, site not specified: Secondary | ICD-10-CM | POA: Diagnosis present

## 2012-07-03 DIAGNOSIS — R1314 Dysphagia, pharyngoesophageal phase: Secondary | ICD-10-CM

## 2012-07-03 DIAGNOSIS — E119 Type 2 diabetes mellitus without complications: Secondary | ICD-10-CM | POA: Diagnosis present

## 2012-07-03 DIAGNOSIS — R933 Abnormal findings on diagnostic imaging of other parts of digestive tract: Secondary | ICD-10-CM

## 2012-07-03 DIAGNOSIS — Z85038 Personal history of other malignant neoplasm of large intestine: Secondary | ICD-10-CM

## 2012-07-03 DIAGNOSIS — M6282 Rhabdomyolysis: Secondary | ICD-10-CM | POA: Diagnosis present

## 2012-07-03 DIAGNOSIS — F0280 Dementia in other diseases classified elsewhere without behavioral disturbance: Secondary | ICD-10-CM | POA: Diagnosis present

## 2012-07-03 DIAGNOSIS — J690 Pneumonitis due to inhalation of food and vomit: Secondary | ICD-10-CM | POA: Diagnosis present

## 2012-07-03 DIAGNOSIS — G309 Alzheimer's disease, unspecified: Secondary | ICD-10-CM | POA: Diagnosis present

## 2012-07-03 DIAGNOSIS — L0291 Cutaneous abscess, unspecified: Secondary | ICD-10-CM

## 2012-07-03 DIAGNOSIS — Z932 Ileostomy status: Secondary | ICD-10-CM

## 2012-07-03 DIAGNOSIS — H9190 Unspecified hearing loss, unspecified ear: Secondary | ICD-10-CM | POA: Diagnosis present

## 2012-07-03 DIAGNOSIS — N179 Acute kidney failure, unspecified: Principal | ICD-10-CM | POA: Diagnosis present

## 2012-07-03 DIAGNOSIS — R509 Fever, unspecified: Secondary | ICD-10-CM | POA: Diagnosis present

## 2012-07-03 DIAGNOSIS — Z66 Do not resuscitate: Secondary | ICD-10-CM | POA: Diagnosis present

## 2012-07-03 DIAGNOSIS — E875 Hyperkalemia: Secondary | ICD-10-CM | POA: Diagnosis present

## 2012-07-03 DIAGNOSIS — L02619 Cutaneous abscess of unspecified foot: Secondary | ICD-10-CM | POA: Diagnosis present

## 2012-07-03 DIAGNOSIS — N039 Chronic nephritic syndrome with unspecified morphologic changes: Secondary | ICD-10-CM

## 2012-07-03 DIAGNOSIS — A419 Sepsis, unspecified organism: Secondary | ICD-10-CM

## 2012-07-03 DIAGNOSIS — K632 Fistula of intestine: Secondary | ICD-10-CM

## 2012-07-03 DIAGNOSIS — R627 Adult failure to thrive: Secondary | ICD-10-CM | POA: Diagnosis present

## 2012-07-03 DIAGNOSIS — N189 Chronic kidney disease, unspecified: Secondary | ICD-10-CM | POA: Diagnosis present

## 2012-07-03 DIAGNOSIS — L03039 Cellulitis of unspecified toe: Secondary | ICD-10-CM | POA: Diagnosis present

## 2012-07-03 DIAGNOSIS — E46 Unspecified protein-calorie malnutrition: Secondary | ICD-10-CM | POA: Diagnosis present

## 2012-07-03 DIAGNOSIS — R1312 Dysphagia, oropharyngeal phase: Secondary | ICD-10-CM | POA: Diagnosis present

## 2012-07-03 HISTORY — DX: Unspecified hearing loss, unspecified ear: H91.90

## 2012-07-03 HISTORY — DX: Anemia, unspecified: D64.9

## 2012-07-03 HISTORY — DX: Delusional disorders: F22

## 2012-07-03 HISTORY — DX: Cerebral infarction, unspecified: I63.9

## 2012-07-03 HISTORY — DX: Hyperlipidemia, unspecified: E78.5

## 2012-07-03 HISTORY — DX: Major depressive disorder, single episode, unspecified: F32.9

## 2012-07-03 HISTORY — DX: Dysphagia, unspecified: R13.10

## 2012-07-03 HISTORY — DX: Type 2 diabetes mellitus without complications: E11.9

## 2012-07-03 HISTORY — DX: Repeated falls: R29.6

## 2012-07-03 HISTORY — DX: Hypokalemia: E87.6

## 2012-07-03 HISTORY — DX: Disorder of kidney and ureter, unspecified: N28.9

## 2012-07-03 HISTORY — DX: Essential (primary) hypertension: I10

## 2012-07-03 HISTORY — DX: Urinary tract infection, site not specified: N39.0

## 2012-07-03 HISTORY — DX: Hypo-osmolality and hyponatremia: E87.1

## 2012-07-03 HISTORY — DX: Heart failure, unspecified: I50.9

## 2012-07-03 HISTORY — DX: Depression, unspecified: F32.A

## 2012-07-03 HISTORY — DX: Unspecified dementia, unspecified severity, without behavioral disturbance, psychotic disturbance, mood disturbance, and anxiety: F03.90

## 2012-07-03 LAB — BASIC METABOLIC PANEL
CO2: 16 mEq/L — ABNORMAL LOW (ref 19–32)
Calcium: 10.3 mg/dL (ref 8.4–10.5)
Creatinine, Ser: 3.71 mg/dL — ABNORMAL HIGH (ref 0.50–1.35)
GFR calc Af Amer: 18 mL/min — ABNORMAL LOW (ref >90)
Sodium: 132 mEq/L — ABNORMAL LOW (ref 135–145)

## 2012-07-03 LAB — CBC WITH DIFFERENTIAL/PLATELET
Basophils Relative: 0 % (ref 0–1)
Eosinophils Absolute: 0 10*3/uL (ref 0.0–0.7)
Eosinophils Relative: 0 % (ref 0–5)
Hemoglobin: 10.3 g/dL — ABNORMAL LOW (ref 13.0–17.0)
Lymphocytes Relative: 2 % — ABNORMAL LOW (ref 12–46)
MCHC: 32.9 g/dL (ref 30.0–36.0)
Monocytes Absolute: 1.3 10*3/uL — ABNORMAL HIGH (ref 0.1–1.0)
Neutrophils Relative %: 90 % — ABNORMAL HIGH (ref 43–77)
Platelets: 178 10*3/uL (ref 150–400)
RBC: 3.87 MIL/uL — ABNORMAL LOW (ref 4.22–5.81)
WBC Morphology: INCREASED

## 2012-07-03 LAB — URINALYSIS, ROUTINE W REFLEX MICROSCOPIC
Nitrite: NEGATIVE
Specific Gravity, Urine: 1.018 (ref 1.005–1.030)
Urobilinogen, UA: 0.2 mg/dL (ref 0.0–1.0)
pH: 5.5 (ref 5.0–8.0)

## 2012-07-03 LAB — LACTIC ACID, PLASMA: Lactic Acid, Venous: 5 mmol/L — ABNORMAL HIGH (ref 0.5–2.2)

## 2012-07-03 LAB — URINE MICROSCOPIC-ADD ON

## 2012-07-03 LAB — GLUCOSE, CAPILLARY: Glucose-Capillary: 90 mg/dL (ref 70–99)

## 2012-07-03 MED ORDER — SODIUM CHLORIDE 0.9 % IV BOLUS (SEPSIS)
1000.0000 mL | Freq: Once | INTRAVENOUS | Status: AC
  Administered 2012-07-03: 1000 mL via INTRAVENOUS

## 2012-07-03 MED ORDER — SODIUM CHLORIDE 0.9 % IV BOLUS (SEPSIS): 1000.0000 mL | Freq: Once | INTRAVENOUS | Status: DC

## 2012-07-03 MED ORDER — CLINDAMYCIN PHOSPHATE 600 MG/50ML IV SOLN
600.0000 mg | Freq: Once | INTRAVENOUS | Status: AC
  Administered 2012-07-03: 600 mg via INTRAVENOUS
  Filled 2012-07-03: qty 50

## 2012-07-03 MED ORDER — ACETAMINOPHEN 500 MG PO TABS
1000.0000 mg | ORAL_TABLET | Freq: Once | ORAL | Status: AC
  Administered 2012-07-03: 1000 mg via ORAL
  Filled 2012-07-03: qty 2

## 2012-07-03 MED ORDER — ACETAMINOPHEN 325 MG PO TABS: 650.0000 mg | ORAL_TABLET | Freq: Once | ORAL | Status: DC

## 2012-07-03 NOTE — ED Provider Notes (Signed)
Medical screening examination/treatment/procedure(s) were performed by non-physician practitioner and as supervising physician I was immediately available for consultation/collaboration.    Nelia Shi, MD 07/03/12 2325

## 2012-07-03 NOTE — ED Notes (Signed)
Per EMS, patent from [facility]. Patient febrile. Patient was started on Rocehpin today, scheduled to start Avelox tomorrow. Patient had chest XR, and was ordered "push fluids, probiotics, and albuterol nebs" at facility. Family visited with patient today and requested him be transferred for evaluation. Patient accompanied by paperwork from facility including physician orders for facility. Patient has illiostomy and is HOH. Patient does not have hearing aids on arrival.

## 2012-07-03 NOTE — ED Notes (Signed)
LLE warm to touch and swollen when compared to RLE. Family at bedside states BLE are swollen.

## 2012-07-03 NOTE — ED Notes (Signed)
Patient transported to X-ray 

## 2012-07-03 NOTE — ED Provider Notes (Signed)
History    CSN: [id number] Arrival date & time 07/03/12  1952  First MD Initiated Contact with Patient 07/03/12 2021     Chief Complaint  Patient presents with  . Fever   (Consider location/radiation/quality/duration/timing/severity/associated sxs/prior Treatment) HPI History provided by physician's note from SNF.  Level 5 caveat applies because of dementia.  Pt evaluated by his PCP at SNF today d/t fever.  Had rhonchi on exam and CXR obtained which showed right-sided atelectasis.  Received a shot of rocephin and prn albuterol nebs, IVF and probiotics ordered.  Family visited him today and requested that he be transferred for evaluation.  Past Medical History  Diagnosis Date  . Hyponatremia   . Hypokalemia   . Anemia   . Diabetes mellitus without complication   . Hypertension   . UTI (lower urinary tract infection)   . Delusional disorder   . HOH (hard of hearing)   . Hyperlipidemia   . CHF (congestive heart failure)   . Renal insufficiency   . Stroke   . Dementia   . Depression   . Dysphagia   . Falls frequently    Past Surgical History  Procedure Laterality Date  . Colon surgery    . Colectomy    . Ileostomy    . Peg tube removal    . Cardiac catheterization    . Cholecystectomy    . Abdominal adhesion surgery     History reviewed. No pertinent family history. History  Substance Use Topics  . Smoking status: Not on file  . Smokeless tobacco: Not on file  . Alcohol Use: Not on file    Review of Systems  All other systems reviewed and are negative.    Allergies  Review of patient's allergies indicates no known allergies.  Home Medications   Current Outpatient Rx  Name  Route  Sig  Dispense  Refill  . acetaminophen (TYLENOL) 500 MG tablet   Oral   Take 500 mg by mouth every 6 (six) hours as needed for pain.         Marland Kitchen aspirin EC 81 MG tablet   Oral   Take 81 mg by mouth daily.         . cefTRIAXone (ROCEPHIN) 1 G injection   Intramuscular  Inject 1 g into the muscle once.         . cyanocobalamin (,VITAMIN B-12,) 1000 MCG/ML injection   Intramuscular   Inject 1,000 mcg into the muscle every 30 (thirty) days.         Marland Kitchen docusate sodium (COLACE) 100 MG capsule   Oral   Take 200 mg by mouth daily.          Marland Kitchen donepezil (ARICEPT) 10 MG tablet   Oral   Take 10 mg by mouth at bedtime.         . feeding supplement (PRO-STAT SUGAR FREE 64) LIQD   Oral   Take 30 mLs by mouth 2 (two) times daily.         . fenofibrate (TRICOR) 48 MG tablet   Oral   Take 48 mg by mouth daily.         . ferrous sulfate 325 (65 FE) MG tablet   Oral   Take 325 mg by mouth daily with breakfast.         . furosemide (LASIX) 20 MG tablet   Oral   Take 20 mg by mouth daily.         Marland Kitchen  insulin detemir (LEVEMIR) 100 UNIT/ML injection   Subcutaneous   Inject 5 Units into the skin at bedtime.         . metoprolol tartrate (LOPRESSOR) 25 MG tablet   Oral   Take 12.5 mg by mouth daily.         . Multiple Vitamin (MULTIVITAMIN WITH MINERALS) TABS   Oral   Take 1 tablet by mouth daily.         . niacin-simvastatin (SIMCOR) 500-20 MG 24 hr tablet   Oral   Take 1 tablet by mouth at bedtime.         Marland Kitchen QUEtiapine (SEROQUEL) 25 MG tablet   Oral   Take 25 mg by mouth daily.         . sertraline (ZOLOFT) 50 MG tablet   Oral   Take 150 mg by mouth daily.         . tamsulosin (FLOMAX) 0.4 MG CAPS   Oral   Take 0.4 mg by mouth daily.         . traZODone (DESYREL) 50 MG tablet   Oral   Take 25 mg by mouth daily.         Marland Kitchen trolamine salicylate (ASPERCREME) 10 % cream   Topical   Apply 1 application topically 2 (two) times daily. Applied to shoulder.         . vitamin C (ASCORBIC ACID) 500 MG tablet   Oral   Take 500 mg by mouth 2 (two) times daily.          BP 105/53  Temp(Src) 98.5 F (36.9 C) (Rectal)  Resp 27  SpO2 98% Physical Exam  Nursing note and vitals reviewed. Constitutional: He appears  well-developed and well-nourished. No distress.  HENT:  Head: Normocephalic and atraumatic.  Eyes:  Normal appearance  Neck: Normal range of motion.  Cardiovascular: Normal rate, regular rhythm and intact distal pulses.   Pulmonary/Chest: Effort normal and breath sounds normal.  Mild tachypnea at 28 breaths/min  Abdominal: Soft. Bowel sounds are normal. He exhibits no distension. There is no tenderness.  Musculoskeletal: Normal range of motion.  Diffuse L lower extremity erythema, warmth, induration.  Grimacing with palpation.    Neurological: He is alert.  Skin: Skin is warm and dry. No rash noted.  Psychiatric: He has a normal mood and affect. His behavior is normal.    ED Course  Procedures (including critical care time) Labs Reviewed  URINALYSIS, ROUTINE W REFLEX MICROSCOPIC - Abnormal; Notable for the following:    APPearance CLOUDY (*)    Hgb urine dipstick TRACE (*)    Protein, ur 100 (*)    Leukocytes, UA MODERATE (*)    All other components within normal limits  CBC WITH DIFFERENTIAL - Abnormal; Notable for the following:    WBC 16.8 (*)    RBC 3.87 (*)    Hemoglobin 10.3 (*)    HCT 31.3 (*)    Neutrophils Relative % 90 (*)    Lymphocytes Relative 2 (*)    Neutro Abs 15.2 (*)    Lymphs Abs 0.3 (*)    Monocytes Absolute 1.3 (*)    All other components within normal limits  BASIC METABOLIC PANEL - Abnormal; Notable for the following:    Sodium 132 (*)    CO2 16 (*)    BUN 25 (*)    Creatinine, Ser 3.71 (*)    GFR calc non Af Amer 16 (*)    GFR calc Af Denyse Dago  18 (*)    All other components within normal limits  URINE MICROSCOPIC-ADD ON - Abnormal; Notable for the following:    Squamous Epithelial / LPF FEW (*)    Bacteria, UA MANY (*)    All other components within normal limits  URINE CULTURE  CULTURE, BLOOD (ROUTINE X 2)  CULTURE, BLOOD (ROUTINE X 2)  GLUCOSE, CAPILLARY  LACTIC ACID, PLASMA   Dg Chest 2 View  07/03/2012   *RADIOLOGY REPORT*  Clinical  Data: Fever.  CHEST - 2 VIEW  Comparison: 09/13/2008  Findings: Minimal bibasilar opacities, likely atelectasis.  Heart is normal size.  No effusions.  Mediastinal contours within normal limits.  No acute bony abnormality.  IMPRESSION: Bibasilar atelectasis.   Original Report Authenticated By: Charlett Nose, M.D.   1. Cellulitis   2. UTI (lower urinary tract infection)   3. Acute kidney injury     MDM  67yo SNF patient w/ dementia presents w/ fever.  His PCP obtained CXR today which showed atelectasis, he received a dose of IM rocephin and po avelox, IVF, prn nebs and probiotics ordered.  His family requested transfer for futher evaluation.  Afebrile, non-toxic appearance, well hydrated, no resp distress, abd benign, cellulitis LLE on exam.  Labs, including UA, and repeat CXR pending.   IVF, clinda for cellulitis and tylenol for pain ordered.  8:59 PM   CXR neg for pna.  U/A positive for infection.  Pt received 1g IV rocephin at SNF today.  Labs otherwise sig for elevated Cr.  Lactic acid and blood cultures pending. Pt admitted to triad for  Cellulitis, UTI and AKI.  He is continues to be alert and VSS.  10:58 PM   Otilio Miu, PA-C 07/03/12 2258

## 2012-07-03 NOTE — ED Notes (Signed)
Patient able to swallow pills. Attempted pocketing. Patient mouth visually checked repeatedly to ensure they were swallowed.

## 2012-07-03 NOTE — ED Notes (Signed)
Spoke with patient sister, [place]. Advised patient will be admitted, no dx or room # available at this time.

## 2012-07-03 NOTE — H&P (Signed)
PCP:   Terald Sleeper, MD   Chief Complaint:  fever  HPI: 67 yo [name] h/o dementia sent from SNF for fever.  snf MD did cxr today which showed atelectasis, unknown if ua was done, pt given a dose of rocephin at snf.  However family requested ED evaluation.  Family has left ED.  Pt cannot provide any history.   He is pleasantly demented.  He has cellulitis of his lle and uti and renal failure for which he will be admitted.  No n/v/d in ED.  Review of Systems:  Unobtainable  Past Medical History: Past Medical History  Diagnosis Date  . Hyponatremia   . Hypokalemia   . Anemia   . Diabetes mellitus without complication   . Hypertension   . UTI (lower urinary tract infection)   . Delusional disorder   . HOH (hard of hearing)   . Hyperlipidemia   . CHF (congestive heart failure)   . Renal insufficiency   . Stroke   . Dementia   . Depression   . Dysphagia   . Falls frequently    Past Surgical History  Procedure Laterality Date  . Colon surgery    . Colectomy    . Ileostomy    . Peg tube removal    . Cardiac catheterization    . Cholecystectomy    . Abdominal adhesion surgery      Medications: Prior to Admission medications   Medication Sig Start Date End Date Taking? Authorizing Provider  acetaminophen (TYLENOL) 500 MG tablet Take 500 mg by mouth every 6 (six) hours as needed for pain.   Yes Historical Provider, MD  aspirin EC 81 MG tablet Take 81 mg by mouth daily.   Yes Historical Provider, MD  cefTRIAXone (ROCEPHIN) 1 G injection Inject 1 g into the muscle once.   Yes Historical Provider, MD  cyanocobalamin (,VITAMIN B-12,) 1000 MCG/ML injection Inject 1,000 mcg into the muscle every 30 (thirty) days.   Yes Historical Provider, MD  docusate sodium (COLACE) 100 MG capsule Take 200 mg by mouth daily.    Yes Historical Provider, MD  donepezil (ARICEPT) 10 MG tablet Take 10 mg by mouth at bedtime.   Yes Historical Provider, MD  feeding supplement (PRO-STAT SUGAR  FREE 64) LIQD Take 30 mLs by mouth 2 (two) times daily.   Yes Historical Provider, MD  fenofibrate (TRICOR) 48 MG tablet Take 48 mg by mouth daily.   Yes Historical Provider, MD  ferrous sulfate 325 (65 FE) MG tablet Take 325 mg by mouth daily with breakfast.   Yes Historical Provider, MD  furosemide (LASIX) 20 MG tablet Take 20 mg by mouth daily.   Yes Historical Provider, MD  insulin detemir (LEVEMIR) 100 UNIT/ML injection Inject 5 Units into the skin at bedtime.   Yes Historical Provider, MD  metoprolol tartrate (LOPRESSOR) 25 MG tablet Take 12.5 mg by mouth daily.   Yes Historical Provider, MD  Multiple Vitamin (MULTIVITAMIN WITH MINERALS) TABS Take 1 tablet by mouth daily.   Yes Historical Provider, MD  niacin-simvastatin (SIMCOR) 500-20 MG 24 hr tablet Take 1 tablet by mouth at bedtime.   Yes Historical Provider, MD  QUEtiapine (SEROQUEL) 25 MG tablet Take 25 mg by mouth daily.   Yes Historical Provider, MD  sertraline (ZOLOFT) 50 MG tablet Take 150 mg by mouth daily.   Yes Historical Provider, MD  tamsulosin (FLOMAX) 0.4 MG CAPS Take 0.4 mg by mouth daily.   Yes Historical Provider, MD  traZODone (DESYREL)  50 MG tablet Take 25 mg by mouth daily.   Yes Historical Provider, MD  trolamine salicylate (ASPERCREME) 10 % cream Apply 1 application topically 2 (two) times daily. Applied to shoulder.   Yes Historical Provider, MD  vitamin C (ASCORBIC ACID) 500 MG tablet Take 500 mg by mouth 2 (two) times daily.   Yes Historical Provider, MD    Allergies:  No Known Allergies  Social History: SNF, code status presumptive full code  Family History: unobtainable  Physical Exam: Filed Vitals:   07/03/12 1953 07/03/12 1959 07/03/12 2245  BP:  105/53   Temp:  98.5 F (36.9 C) 98.5 F (36.9 C)  TempSrc:  Rectal   Resp:  27   SpO2: 98% 98%    General appearance: alert, cooperative and no distress Head: Normocephalic, without obvious abnormality, atraumatic Eyes: negative Nose: Nares  normal. Septum midline. Mucosa normal. No drainage or sinus tenderness. Neck: no JVD and supple, symmetrical, trachea midline Lungs: clear to auscultation bilaterally Heart: regular rate and rhythm, S1, S2 normal, no murmur, click, rub or gallop Abdomen: soft, non-tender; bowel sounds normal; no masses,  no organomegaly Extremities: extremities normal, atraumatic, no cyanosis or edema Pulses: 2+ and symmetric Skin: lle cellulitis Neurologic: Grossly normal    Labs on Admission:   Recent Labs  07/03/12 2100  NA 132*  K 4.8  CL 99  CO2 16*  GLUCOSE 92  BUN 25*  CREATININE 3.71*  CALCIUM 10.3    Recent Labs  07/03/12 2100  WBC 16.8*  NEUTROABS 15.2*  HGB 10.3*  HCT 31.3*  MCV 80.9  PLT 178    Radiological Exams on Admission: Dg Chest 2 View  07/03/2012   *RADIOLOGY REPORT*  Clinical Data: Fever.  CHEST - 2 VIEW  Comparison: 09/13/2008  Findings: Minimal bibasilar opacities, likely atelectasis.  Heart is normal size.  No effusions.  Mediastinal contours within normal limits.  No acute bony abnormality.  IMPRESSION: Bibasilar atelectasis.   Original Report Authenticated By: Charlett Nose, M.D.    Assessment/Plan 67 yo [name] with fever, lle cellulitis, uti and worsening renal failure  Principal Problem:   Acute renal failure Active Problems:   DM   Alzheimer's disease   Anemia in chronic kidney disease(285.21)   UTI (lower urinary tract infection)   Cellulitis and abscess of leg  Place on iv clinda.  Urine and blood cultures pending.  ivf overnight and monitor renal function.  Adequate uop at this time.  Hold bp meds for now.  Previous Cr 1.5 now over 3.  Clarify code status with family in am.  Presumptive full code.  Tele bed.  Ewin Rehberg A 07/03/2012, 10:55 PM

## 2012-07-03 NOTE — ED Notes (Signed)
GNF:[id number]<YQ> Expected date:07/03/12<BR> Expected time: 7:34 PM<BR> Means of arrival:Ambulance<BR> Comments:<BR> Fever, swollen warm leg

## 2012-07-03 NOTE — ED Notes (Signed)
Family at bedside. Family requesting patient to be admitted. Family advised patient has not been seen yet by EDP, and it would be at the discretion of the physician.

## 2012-07-04 ENCOUNTER — Encounter (HOSPITAL_COMMUNITY): Payer: Self-pay | Admitting: *Deleted

## 2012-07-04 ENCOUNTER — Inpatient Hospital Stay (HOSPITAL_COMMUNITY): Payer: Medicare Other

## 2012-07-04 DIAGNOSIS — M7989 Other specified soft tissue disorders: Secondary | ICD-10-CM

## 2012-07-04 LAB — BASIC METABOLIC PANEL
BUN: 33 mg/dL — ABNORMAL HIGH (ref 6–23)
BUN: 39 mg/dL — ABNORMAL HIGH (ref 6–23)
Calcium: 9.6 mg/dL (ref 8.4–10.5)
Calcium: 9.4 mg/dL (ref 8.4–10.5)
Calcium: 9.4 mg/dL (ref 8.4–10.5)
Chloride: 99 mEq/L (ref 96–112)
Creatinine, Ser: 4.1 mg/dL — ABNORMAL HIGH (ref 0.50–1.35)
GFR calc Af Amer: 16 mL/min — ABNORMAL LOW (ref >90)
GFR calc Af Amer: 16 mL/min — ABNORMAL LOW (ref >90)
GFR calc Af Amer: 16 mL/min — ABNORMAL LOW (ref >90)
GFR calc non Af Amer: 14 mL/min — ABNORMAL LOW (ref >90)
GFR calc non Af Amer: 13 mL/min — ABNORMAL LOW (ref >90)
GFR calc non Af Amer: 14 mL/min — ABNORMAL LOW (ref >90)
Glucose, Bld: 131 mg/dL — ABNORMAL HIGH (ref 70–99)
Glucose, Bld: 164 mg/dL — ABNORMAL HIGH (ref 70–99)
Potassium: 6 mEq/L — ABNORMAL HIGH (ref 3.5–5.1)
Sodium: 129 mEq/L — ABNORMAL LOW (ref 135–145)
Sodium: 130 mEq/L — ABNORMAL LOW (ref 135–145)

## 2012-07-04 LAB — CBC
MCHC: 32.9 g/dL (ref 30.0–36.0)
Platelets: 151 10*3/uL (ref 150–400)
RDW: 14.9 % (ref 11.5–15.5)
WBC: 20.6 10*3/uL — ABNORMAL HIGH (ref 4.0–10.5)

## 2012-07-04 LAB — MRSA PCR SCREENING: MRSA by PCR: NEGATIVE

## 2012-07-04 LAB — GLUCOSE, CAPILLARY: Glucose-Capillary: 115 mg/dL — ABNORMAL HIGH (ref 70–99)

## 2012-07-04 MED ORDER — CLINDAMYCIN PHOSPHATE 600 MG/50ML IV SOLN
600.0000 mg | Freq: Three times a day (TID) | INTRAVENOUS | Status: DC
  Administered 2012-07-04 – 2012-07-09 (×16): 600 mg via INTRAVENOUS
  Filled 2012-07-04 (×19): qty 50

## 2012-07-04 MED ORDER — QUETIAPINE FUMARATE 25 MG PO TABS
25.0000 mg | ORAL_TABLET | Freq: Every day | ORAL | Status: DC
  Administered 2012-07-04 – 2012-07-14 (×5): 25 mg via ORAL
  Filled 2012-07-04 (×11): qty 1

## 2012-07-04 MED ORDER — HYDROMORPHONE HCL PF 1 MG/ML IJ SOLN: 0.5000 mg | Freq: Once | INTRAMUSCULAR | Status: DC

## 2012-07-04 MED ORDER — SODIUM CHLORIDE 0.9 % IV SOLN
INTRAVENOUS | Status: DC
  Administered 2012-07-04 – 2012-07-05 (×3): via INTRAVENOUS

## 2012-07-04 MED ORDER — ENOXAPARIN SODIUM 30 MG/0.3ML ~~LOC~~ SOLN
30.0000 mg | SUBCUTANEOUS | Status: DC
  Administered 2012-07-04 – 2012-07-07 (×4): 30 mg via SUBCUTANEOUS
  Filled 2012-07-04 (×4): qty 0.3

## 2012-07-04 MED ORDER — TRAZODONE 25 MG HALF TABLET
25.0000 mg | ORAL_TABLET | Freq: Every day | ORAL | Status: DC
  Administered 2012-07-04 – 2012-07-14 (×6): 25 mg via ORAL
  Filled 2012-07-04 (×11): qty 1

## 2012-07-04 MED ORDER — SODIUM CHLORIDE 0.9 % IV SOLN
INTRAVENOUS | Status: DC
  Administered 2012-07-04: 03:00:00 via INTRAVENOUS

## 2012-07-04 MED ORDER — SODIUM POLYSTYRENE SULFONATE 15 GM/60ML PO SUSP
15.0000 g | Freq: Once | ORAL | Status: AC
  Administered 2012-07-04: 15 g via ORAL
  Filled 2012-07-04: qty 60

## 2012-07-04 MED ORDER — DONEPEZIL HCL 10 MG PO TABS
10.0000 mg | ORAL_TABLET | Freq: Every day | ORAL | Status: DC
  Administered 2012-07-04 – 2012-07-12 (×8): 10 mg via ORAL
  Filled 2012-07-04 (×12): qty 1

## 2012-07-04 MED ORDER — SERTRALINE HCL 50 MG PO TABS
150.0000 mg | ORAL_TABLET | Freq: Every day | ORAL | Status: DC
  Administered 2012-07-04 – 2012-07-14 (×6): 150 mg via ORAL
  Filled 2012-07-04 (×11): qty 1

## 2012-07-04 MED ORDER — CLINDAMYCIN PHOSPHATE 600 MG/50ML IV SOLN
600.0000 mg | Freq: Two times a day (BID) | INTRAVENOUS | Status: DC
  Filled 2012-07-04: qty 50

## 2012-07-04 MED ORDER — INSULIN ASPART 100 UNIT/ML ~~LOC~~ SOLN
0.0000 [IU] | Freq: Three times a day (TID) | SUBCUTANEOUS | Status: DC
  Administered 2012-07-04: 1 [IU] via SUBCUTANEOUS
  Administered 2012-07-04 (×2): 2 [IU] via SUBCUTANEOUS
  Administered 2012-07-05 – 2012-07-14 (×6): 1 [IU] via SUBCUTANEOUS

## 2012-07-04 MED ORDER — INSULIN DETEMIR 100 UNIT/ML ~~LOC~~ SOLN
5.0000 [IU] | Freq: Every day | SUBCUTANEOUS | Status: DC
  Administered 2012-07-04 – 2012-07-05 (×3): 5 [IU] via SUBCUTANEOUS
  Filled 2012-07-04 (×5): qty 0.05

## 2012-07-04 MED ORDER — INSULIN ASPART 100 UNIT/ML IV SOLN
10.0000 [IU] | Freq: Once | INTRAVENOUS | Status: AC
  Administered 2012-07-04: 10 [IU] via INTRAVENOUS

## 2012-07-04 MED ORDER — ASPIRIN EC 81 MG PO TBEC
81.0000 mg | DELAYED_RELEASE_TABLET | Freq: Every day | ORAL | Status: DC
  Administered 2012-07-04 – 2012-07-14 (×7): 81 mg via ORAL
  Filled 2012-07-04 (×11): qty 1

## 2012-07-04 MED ORDER — SODIUM CHLORIDE 0.9 % IV SOLN: INTRAVENOUS | Status: DC

## 2012-07-04 MED ORDER — SODIUM CHLORIDE 0.9 % IJ SOLN
3.0000 mL | Freq: Two times a day (BID) | INTRAMUSCULAR | Status: DC
  Administered 2012-07-04 – 2012-07-08 (×2): 3 mL via INTRAVENOUS

## 2012-07-04 MED ORDER — DEXTROSE 50 % IV SOLN
1.0000 | Freq: Once | INTRAVENOUS | Status: AC
  Administered 2012-07-04: 50 mL via INTRAVENOUS
  Filled 2012-07-04: qty 50

## 2012-07-04 NOTE — Progress Notes (Signed)
VASCULAR LAB PRELIMINARY  PRELIMINARY  PRELIMINARY  PRELIMINARY  Bilateral lower extremity venous duplex  completed.    Preliminary report:  Bilateral:  No obvious evidence of DVT, superficial thrombosis, or Baker's Cyst.  Technically limited by edema.    Wanette Robison, RVT 07/04/2012, 2:20 PM

## 2012-07-04 NOTE — Progress Notes (Signed)
CRITICAL VALUE ALERT  Critical value received:  6.4 potassium  Date of notification:  07/04/12  Time of notification: 06:00  Critical value read back:yes   Nurse who received alert: Ernesta Amble  MD notified (1st page): I. Izola Price, MD     Time of first page: 06:02  MD notified (2nd page): no  Time of second page:  Responding MD: Denna Haggard, MD  Time MD responded: 06:08

## 2012-07-04 NOTE — Progress Notes (Signed)
Text paged Dr. Gonzella Lex regarding patient's junctional rhythm,  EKG shows the same rhythm. Day nurse is aware and will continue to monitor patient.  Ernesta Amble, RN

## 2012-07-04 NOTE — Progress Notes (Signed)
CARE MANAGEMENT NOTE 07/04/2012  Patient:  [name], [name]   Account Number:  [id number]  Date Initiated:  07/04/2012  Documentation initiated by:  DAVIS,RHONDA  Subjective/Objective Assessment:   pt from local snf with fever, renal failure with abnormal labs elevated lactic acid.     Action/Plan:   snf when stable-[place]   Anticipated DC Date:  07/07/2012   Anticipated DC Plan:  SKILLED NURSING FACILITY  In-house referral  Clinical Social Worker      DC Planning Services  NA      [facility] Choice  NA   Choice offered to / List presented to:  NA   DME arranged  NA      DME agency  NA     HH arranged  NA      HH agency  NA   Status of service:  In process, will continue to follow Medicare Important Message given?  NA - LOS <3 / Initial given by admissions (If response is "NO", the following Medicare IM given date fields will be blank) Date Medicare IM given:   Date Additional Medicare IM given:    Discharge Disposition:    Per UR Regulation:  Reviewed for med. necessity/level of care/duration of stay  If discussed at Long Length of Stay Meetings, dates discussed:    Comments:  [id number] Earlene Plater, RN, BSN, CCM:  CHART REVIEWED AND UPDATED.  Next chart review due on [id number]. NO DISCHARGE NEEDS PRESENT AT THIS TIME. CASE MANAGEMENT [phone]

## 2012-07-04 NOTE — ED Notes (Signed)
Report called, Susie advises room is not ready. Requested to call me at [phone] when room is ready.

## 2012-07-04 NOTE — Progress Notes (Signed)
ANTIBIOTIC CONSULT NOTE - INITIAL  Pharmacy Consult for Clindamycin Indication: LLE cellulits  No Known Allergies  Patient Measurements:   Wt=98.7 kg  Vital Signs: Temp: 98.7 F (37.1 C) (06/23 2347) Temp src: Oral (06/23 2347) BP: 104/60 mmHg (06/23 2347) Intake/Output from previous day: 06/23 0701 - 06/24 0700 In: 3 [I.V.:3] Out: 250 [Urine:250] Intake/Output from this shift: Total I/O In: 3 [I.V.:3] Out: 250 [Urine:250]  Labs:  Recent Labs  07/03/12 2100  WBC 16.8*  HGB 10.3*  PLT 178  CREATININE 3.71*   The CrCl is unknown because both a height and weight (above a minimum accepted value) are required for this calculation. No results found for this basename: VANCOTROUGH, VANCOPEAK, VANCORANDOM, GENTTROUGH, GENTPEAK, GENTRANDOM, TOBRATROUGH, TOBRAPEAK, TOBRARND, AMIKACINPEAK, AMIKACINTROU, AMIKACIN,  in the last 72 hours   Microbiology: No results found for this or any previous visit (from the past 720 hour(s)).  Medical History: Past Medical History  Diagnosis Date  . Hyponatremia   . Hypokalemia   . Anemia   . Diabetes mellitus without complication   . Hypertension   . UTI (lower urinary tract infection)   . Delusional disorder   . HOH (hard of hearing)   . Hyperlipidemia   . CHF (congestive heart failure)   . Renal insufficiency   . Stroke   . Dementia   . Depression   . Dysphagia   . Falls frequently     Medications:  Scheduled:  . sodium chloride   Intravenous STAT  . aspirin EC  81 mg Oral Daily  . clindamycin (CLEOCIN) IV  600 mg Intravenous Q8H  . donepezil  10 mg Oral QHS  . enoxaparin (LOVENOX) injection  30 mg Subcutaneous Q24H  . insulin detemir  5 Units Subcutaneous QHS  . QUEtiapine  25 mg Oral Daily  . sertraline  150 mg Oral Daily  . sodium chloride  1,000 mL Intravenous Once  . sodium chloride  3 mL Intravenous Q12H  . traZODone  25 mg Oral Daily   Infusions:  . sodium chloride 75 mL/hr at 07/04/12 0247    Assessment: 67 yo admitted with LLE cellulitis.  Goal of Therapy:  Treat infection  Plan:   Clindamycin 600mg  IV q8h.  F/U levels/cultures as needed.  Lorenza Evangelist 07/04/2012,2:47 AM

## 2012-07-04 NOTE — ED Notes (Signed)
Room ready

## 2012-07-04 NOTE — Progress Notes (Signed)
[facility] PROGRESS NOTE  [name] YNW:[id number] DOB: 15-Nov-1945 DOA: 07/03/2012 PCP: Terald Sleeper, MD  Assessment/Plan: 1. Cellulitis of LLE -continue IV clindamycin, elevate limb -check dopplers to r/o DVT  2. Leukocytosis -likely related to 1, Fu blood Cx -CXr with atelectasis only -UA not highly suggestive of UTI, received 1 dose of ceftriaxone at SNF yesterday -Fu Urine Cx sent yesterday  3. ARF on CKD: baseline creatinine 1.4 -Hydrate, hold lasix -check renal USG -check CK -Fu urine output, Bmet in am  4. Hyperkalemia -due to 1, give insulin, dextrose, kayexalate -repeat Bmet this pm  5. Dementia: moderately advanced -stable -continue aricept, seroquel  6. DM -continue levemir, SSI  7. DVt proph: lovenox   Code Status: FULL Family Communication: none at bedside Disposition Plan: back to SNF when stable  HPI/Subjective: No new complaints  Objective: Filed Vitals:   07/03/12 2245 07/03/12 2347 07/04/12 0200 07/04/12 0625  BP:  104/60 107/55 118/58  Pulse:   81 65  Temp: 98.5 F (36.9 C) 98.7 F (37.1 C) 98.4 F (36.9 C) 99.1 F (37.3 C)  TempSrc:  Oral Oral Axillary  Resp:  34 20 28  Height:   5\' 10"  (1.778 m)   Weight:   100.8 kg (222 lb 3.6 oz)   SpO2:  94% 100% 100%    Intake/Output Summary (Last 24 hours) at 07/04/12 1228 Last data filed at 07/04/12 1145  Gross per 24 hour  Intake 919.25 ml  Output    925 ml  Net  -5.75 ml   Filed Weights   07/04/12 0200  Weight: 100.8 kg (222 lb 3.6 oz)    Exam:   General: Alert, awake, oirented to self only, confused  Cardiovascular: S1S2/RRR  Respiratory: CTAB  Abdomen: soft, NT, BS present  Musculoskeletal:L leg with some erythema, swelling  Data Reviewed: Basic Metabolic Panel:  Recent Labs Lab 07/03/12 2100 07/04/12 0450 07/04/12 0630  NA 132* 129* 129*  K 4.8 6.4* 6.0*  CL 99 99 98  CO2 16* 17* 18*  GLUCOSE 92 119* 131*  BUN 25* 33* 35*  CREATININE  3.71* 4.10* 4.19*  CALCIUM 10.3 9.6 9.4   Liver Function Tests: No results found for this basename: AST, ALT, ALKPHOS, BILITOT, PROT, ALBUMIN,  in the last 168 hours No results found for this basename: LIPASE, AMYLASE,  in the last 168 hours No results found for this basename: AMMONIA,  in the last 168 hours CBC:  Recent Labs Lab 07/03/12 2100 07/04/12 0450  WBC 16.8* 20.6*  NEUTROABS 15.2*  --   HGB 10.3* 10.4*  HCT 31.3* 31.6*  MCV 80.9 80.2  PLT 178 151   Cardiac Enzymes:  Recent Labs Lab 07/04/12 0630  CKTOTAL 1457*   BNP (last 3 results) No results found for this basename: PROBNP,  in the last 8760 hours CBG:  Recent Labs Lab 07/03/12 2000 07/04/12 0234 07/04/12 0751  GLUCAP 90 97 124*    Recent Results (from the past 240 hour(s))  MRSA PCR SCREENING     Status: None   Collection Time    07/04/12  2:44 AM      Result Value Range Status   MRSA by PCR NEGATIVE  NEGATIVE Final   Comment:            The GeneXpert MRSA Assay (FDA     approved for NASAL specimens     only), is one component of a     comprehensive MRSA colonization  surveillance program. It is not     intended to diagnose MRSA     infection nor to guide or     monitor treatment for     MRSA infections.     Studies: Dg Chest 2 View  07/03/2012   *RADIOLOGY REPORT*  Clinical Data: Fever.  CHEST - 2 VIEW  Comparison: 09/13/2008  Findings: Minimal bibasilar opacities, likely atelectasis.  Heart is normal size.  No effusions.  Mediastinal contours within normal limits.  No acute bony abnormality.  IMPRESSION: Bibasilar atelectasis.   Original Report Authenticated By: Charlett Nose, M.D.   US Renal  07/04/2012   *RADIOLOGY REPORT*  Clinical Data: Acute renal failure  RENAL/URINARY TRACT ULTRASOUND COMPLETE  Comparison:  09/06/2007  Findings:  Right Kidney:  11.5 cm in length.  Normal cortical echogenicity. No hydronephrosis or mass.  Mild cortical thinning.  Left Kidney:  11.0 cm in length.  No  hydronephrosis or mass. Normal cortical echogenicity.  Mild cortical thinning.  Bladder:  Within normal limits.  IMPRESSION: No acute urinary pathology.  Mild cortical thinning bilaterally.   Original Report Authenticated By: Jolaine Click, M.D.    Scheduled Meds: . aspirin EC  81 mg Oral Daily  . clindamycin (CLEOCIN) IV  600 mg Intravenous Q8H  . donepezil  10 mg Oral QHS  . enoxaparin (LOVENOX) injection  30 mg Subcutaneous Q24H  . insulin aspart  0-9 Units Subcutaneous TID WC  . insulin detemir  5 Units Subcutaneous QHS  . QUEtiapine  25 mg Oral Daily  . sertraline  150 mg Oral Daily  . sodium chloride  1,000 mL Intravenous Once  . sodium chloride  3 mL Intravenous Q12H  . traZODone  25 mg Oral Daily   Continuous Infusions: . sodium chloride 100 mL/hr at 07/04/12 8295    Principal Problem:   Acute renal failure Active Problems:   DM   Alzheimer's disease   Anemia in chronic kidney disease(285.21)   UTI (lower urinary tract infection)   Cellulitis and abscess of leg    Time spent:    [facility]  [facility] Pager [phone]. If 7PM-7AM, please contact night-coverage at [email or web address], password [facility] 07/04/2012, 12:28 PM  LOS: 1 day

## 2012-07-05 LAB — BASIC METABOLIC PANEL
BUN: 47 mg/dL — ABNORMAL HIGH (ref 6–23)
Calcium: 9.1 mg/dL (ref 8.4–10.5)
Creatinine, Ser: 3.92 mg/dL — ABNORMAL HIGH (ref 0.50–1.35)
GFR calc non Af Amer: 15 mL/min — ABNORMAL LOW (ref >90)
Glucose, Bld: 125 mg/dL — ABNORMAL HIGH (ref 70–99)

## 2012-07-05 LAB — CBC
HCT: 26.7 % — ABNORMAL LOW (ref 39.0–52.0)
Hemoglobin: 9 g/dL — ABNORMAL LOW (ref 13.0–17.0)
MCH: 26.5 pg (ref 26.0–34.0)
MCHC: 33.7 g/dL (ref 30.0–36.0)
RDW: 15.1 % (ref 11.5–15.5)

## 2012-07-05 LAB — GLUCOSE, CAPILLARY
Glucose-Capillary: 132 mg/dL — ABNORMAL HIGH (ref 70–99)
Glucose-Capillary: 120 mg/dL — ABNORMAL HIGH (ref 70–99)

## 2012-07-05 MED ORDER — SODIUM CHLORIDE 0.9 % IV SOLN
INTRAVENOUS | Status: DC
  Administered 2012-07-05: 125 mL/h via INTRAVENOUS
  Administered 2012-07-05 – 2012-07-06 (×2): via INTRAVENOUS

## 2012-07-05 NOTE — Progress Notes (Signed)
[facility] PROGRESS NOTE  [name] MVH:[id number] DOB: [birth year] DOA: 07/03/2012 PCP: Terald Sleeper, MD  Assessment/Plan: 1-Cellulitis of LLE -continue IV clindamycin day 2. -dopplers negative for  DVT   2. Leukocytosis  -likely related to Celulitis, Fu blood Cx  -CXr with atelectasis only  -UA not highly suggestive of UTI, received 1 dose of ceftriaxone at SNF yesterday  -Fu Urine Cx-WBC trending down.   3. ARF on CKD: baseline creatinine 1.4  -Component of rhabdomyolysis. Repeat CK in am.  -Continue with IV fluids. hold lasix  -renal US: No acute urinary pathology. Mild cortical thinning bilaterally. -good urine out put. Cr trending down.   4. Hyperkalemia  -Resolved.  -Received kayexalate and insulin.  -Repeat B-met in am.   5. Dementia: moderately advanced  -stable  -continue aricept, seroquel   6. DM  -continue levemir, SSI   7. DVt proph: lovenox   Code Status: Full Code.  Family Communication: none at bedside.  Disposition Plan: Back to SNF when stable.    Consultants:  Wound care nurse.   Procedures:  Doppler LE negative for DVT.   Antibiotics:  Clindamycin 6-24   HPI/Subjective: Hard of hearing, difficult to understand. Unclear baseline.   Objective: Filed Vitals:   07/04/12 0625 07/04/12 1342 07/04/12 2124 07/05/12 0615  BP: 118/58 107/53 116/49 116/48  Pulse: 65 58 74 66  Temp: 99.1 F (37.3 C) 97.5 F (36.4 C) 98 F (36.7 C) 98.2 F (36.8 C)  TempSrc: Axillary Oral Oral Oral  Resp: 28 24 22 18   Height:      Weight:      SpO2: 100% 100% 100% 97%    Intake/Output Summary (Last 24 hours) at 07/05/12 1004 Last data filed at 07/05/12 0746  Gross per 24 hour  Intake 3293.33 ml  Output   2575 ml  Net 718.33 ml   Filed Weights   07/04/12 0200  Weight: 100.8 kg (222 lb 3.6 oz)    Exam:   General:  No distress.   Cardiovascular:S 1, S 2 RRR  Respiratory: Bilateral ronchus.   Abdomen: Bs present,  soft, NT  Musculoskeletal: left LE with erythema, edema. Left big toe with ulcer   Data Reviewed: Basic Metabolic Panel:  Recent Labs Lab 07/03/12 2100 07/04/12 0450 07/04/12 0630 07/04/12 1500 07/05/12 0442  NA 132* 129* 129* 130* 130*  K 4.8 6.4* 6.0* 5.3* 5.1  CL 99 99 98 97 101  CO2 16* 17* 18* 18* 19  GLUCOSE 92 119* 131* 164* 125*  BUN 25* 33* 35* 39* 47*  CREATININE 3.71* 4.10* 4.19* 4.06* 3.92*  CALCIUM 10.3 9.6 9.4 9.4 9.1   Liver Function Tests: No results found for this basename: AST, ALT, ALKPHOS, BILITOT, PROT, ALBUMIN,  in the last 168 hours No results found for this basename: LIPASE, AMYLASE,  in the last 168 hours No results found for this basename: AMMONIA,  in the last 168 hours CBC:  Recent Labs Lab 07/03/12 2100 07/04/12 0450 07/05/12 0442  WBC 16.8* 20.6* 18.9*  NEUTROABS 15.2*  --   --   HGB 10.3* 10.4* 9.0*  HCT 31.3* 31.6* 26.7*  MCV 80.9 80.2 78.5  PLT 178 151 140*   Cardiac Enzymes:  Recent Labs Lab 07/04/12 0630  CKTOTAL 1457*   BNP (last 3 results) No results found for this basename: PROBNP,  in the last 8760 hours CBG:  Recent Labs Lab 07/04/12 0751 07/04/12 1148 07/04/12 1658 07/04/12 2224 07/05/12 0739  GLUCAP 124* 154* 151*  115* 118*    Recent Results (from the past 240 hour(s))  CULTURE, BLOOD (ROUTINE X 2)     Status: None   Collection Time    07/03/12  9:00 PM      Result Value Range Status   Specimen Description BLOOD LEFT ANTECUBITAL   Final   Special Requests BOTTLES DRAWN AEROBIC AND ANAEROBIC 3CC   Final   Culture  Setup Time 07/04/2012 04:42   Final   Culture     Final   Value:        BLOOD CULTURE RECEIVED NO GROWTH TO DATE CULTURE WILL BE HELD FOR 5 DAYS BEFORE ISSUING A FINAL NEGATIVE REPORT   Report Status PENDING   Incomplete  URINE CULTURE     Status: None   Collection Time    07/03/12  9:12 PM      Result Value Range Status   Specimen Description URINE, CATHETERIZED   Final   Special Requests  NONE   Final   Culture  Setup Time 07/04/2012 04:07   Final   Colony Count PENDING   Incomplete   Culture Culture reincubated for better growth   Final   Report Status PENDING   Incomplete  CULTURE, BLOOD (ROUTINE X 2)     Status: None   Collection Time    07/03/12 10:30 PM      Result Value Range Status   Specimen Description BLOOD LEFT HAND   Final   Special Requests BOTTLES DRAWN AEROBIC AND ANAEROBIC 3CC   Final   Culture  Setup Time 07/04/2012 01:32   Final   Culture     Final   Value:        BLOOD CULTURE RECEIVED NO GROWTH TO DATE CULTURE WILL BE HELD FOR 5 DAYS BEFORE ISSUING A FINAL NEGATIVE REPORT   Report Status PENDING   Incomplete  MRSA PCR SCREENING     Status: None   Collection Time    07/04/12  2:44 AM      Result Value Range Status   MRSA by PCR NEGATIVE  NEGATIVE Final   Comment:            The GeneXpert MRSA Assay (FDA     approved for NASAL specimens     only), is one component of a     comprehensive MRSA colonization     surveillance program. It is not     intended to diagnose MRSA     infection nor to guide or     monitor treatment for     MRSA infections.     Studies: Dg Chest 2 View  07/03/2012   *RADIOLOGY REPORT*  Clinical Data: Fever.  CHEST - 2 VIEW  Comparison: 09/13/2008  Findings: Minimal bibasilar opacities, likely atelectasis.  Heart is normal size.  No effusions.  Mediastinal contours within normal limits.  No acute bony abnormality.  IMPRESSION: Bibasilar atelectasis.   Original Report Authenticated By: Charlett Nose, M.D.   US Renal  07/04/2012   *RADIOLOGY REPORT*  Clinical Data: Acute renal failure  RENAL/URINARY TRACT ULTRASOUND COMPLETE  Comparison:  09/06/2007  Findings:  Right Kidney:  11.5 cm in length.  Normal cortical echogenicity. No hydronephrosis or mass.  Mild cortical thinning.  Left Kidney:  11.0 cm in length.  No hydronephrosis or mass. Normal cortical echogenicity.  Mild cortical thinning.  Bladder:  Within normal limits.   IMPRESSION: No acute urinary pathology.  Mild cortical thinning bilaterally.   Original Report Authenticated By: Merton Border  Hoss, M.D.    Scheduled Meds: . aspirin EC  81 mg Oral Daily  . clindamycin (CLEOCIN) IV  600 mg Intravenous Q8H  . donepezil  10 mg Oral QHS  . enoxaparin (LOVENOX) injection  30 mg Subcutaneous Q24H  . insulin aspart  0-9 Units Subcutaneous TID WC  . insulin detemir  5 Units Subcutaneous QHS  . QUEtiapine  25 mg Oral Daily  . sertraline  150 mg Oral Daily  . sodium chloride  1,000 mL Intravenous Once  . sodium chloride  3 mL Intravenous Q12H  . traZODone  25 mg Oral Daily   Continuous Infusions: . sodium chloride 100 mL/hr at 07/05/12 0137  . sodium chloride      Principal Problem:   Acute renal failure Active Problems:   DM   Alzheimer's disease   Anemia in chronic kidney disease(285.21)   UTI (lower urinary tract infection)   Cellulitis and abscess of leg    Time spent: 35 minutes.     Caelen Higinbotham  [facility] Pager [phone]. If 7PM-7AM, please contact night-coverage at [email or web address], password [facility] 07/05/2012, 10:04 AM  LOS: 2 days

## 2012-07-05 NOTE — Progress Notes (Signed)
SLP  Bedside swallow evaluation completed, full report to follow.   Per discussion with RN and nurse technician, pt was clear earlier today prior to consuming breakfast and lunch.  Currently pt congested (suspect aspiration of secretions) that he can not clear.  Pt has a h/o significant dysphagia requiring short term NPO status in 2009.    Recommend pt be npo at this time (except necessary medications with small amount of applesauce) and reassess pt next date for improved airway protection and swallow function.   Pt would benefit from repeat MBS when clinically indicated due to sensorimotor deficits noted on previous MBS studies.  RN and nurse technician informed of recommendations.    SLP phoned SNF RN who reported pt was on a regular/thin diet prior to admission with good tolerance.    Orders obtained, thank you.    Donavan Burnet, MS [facility] SLP [phone]

## 2012-07-05 NOTE — Evaluation (Signed)
Clinical/Bedside Swallow Evaluation Patient Details  Name: [name] MRN: [id number] Date of Birth: March 02, 1945  Today's Date: 07/05/2012 Time: 1350-1411 SLP Time Calculation (min): 21 min  Past Medical History:  Past Medical History  Diagnosis Date  . Hyponatremia   . Hypokalemia   . Anemia   . Diabetes mellitus without complication   . Hypertension   . UTI (lower urinary tract infection)   . Delusional disorder   . HOH (hard of hearing)   . Hyperlipidemia   . CHF (congestive heart failure)   . Renal insufficiency   . Stroke   . Dementia   . Depression   . Dysphagia   . Falls frequently    Past Surgical History:  Past Surgical History  Procedure Laterality Date  . Colon surgery    . Colectomy    . Ileostomy    . Peg tube removal    . Cardiac catheterization    . Cholecystectomy    . Abdominal adhesion surgery     HPI:  67 yo male adm to [facility] from [facility] with cellulitis of left lower extremity.  Pt with leukocytosis, hyperkalemia, dementia, DM.   Pt passed an RN stroke swallow screen although he has h/o severe dysphagia and has undergone multiple MBS studies.  Per SNF RN Dan Humphreys (who works with pt), he has been on regular/ground and thin liquid diet - feeding himself without difficulties.  Rn noted pt with increased congestion today after breakfast and lunch as well as oral pocketing and was concerned for aspiration.  Swallow evaluation was ordered.     Assessment / Plan / Recommendation Clinical Impression  Bedside swallow evaluation completed.  Pt demonstrates clinical indicators of oropharyngeal dysphagia and concern for aspiration.   Delayed swallow intiation noted with subtle  throat clearing with thin liquid (? penetraiton/aspiration) and multiple swallows across consistencies that may indicate stasis.  Pt with h/o known h/o sensorimotor deficits with weakness per MBS 2009.  Currently pt is congested (? at larynx, secretion aspiration) and did not cough on  command to clear and is a high aspiration risk.   Recommend pt be npo at this time (except necessary medications with small amount of applesauce) and reassess pt next date for improved airway protection and swallow function.       Aspiration Risk  Severe    Diet Recommendation NPO (medicine with applesauce)   Medication Administration: Whole meds with puree    Other  Recommendations Recommended Consults: MBS (when clinically indicated)   Follow Up Recommendations       Frequency and Duration min 1 x/week  1 week   Pertinent Vitals/Pain Afebrile, congested    SLP Swallow Goals Goal #3: Pt will cough on command and clear secretions with max assistance to aid airway protection and help determine readiness for MBS.     Swallow Study Prior Functional Status   pt fed himself regular (ground) with thin    General Date of Onset: 07/05/12 HPI: 67 yo male adm to [facility] from [facility] with cellulitis of left lower extremity.  Pt with leukocytosis, hyperkalemia, dementia, DM.   Pt passed an RN stroke swallow screen although he has h/o severe dysphagia and has undergone multiple MBS studies.  Per SNF RN Dan Humphreys (who works with pt), he has been on regular/ground and thin liquid diet - feeding himself without difficulties.  Rn noted pt with increased congestion today after breakfast and lunch as well as oral pocketing and was concerned for aspiration.  Swallow evaluation was ordered.   Type of Study: Bedside swallow evaluation Diet Prior to this Study: Dysphagia 3 (soft);Thin liquids Temperature Spikes Noted: No Respiratory Status: Room air History of Recent Intubation: No Behavior/Cognition: Alert;Doesn't follow directions;Decreased sustained attention;Requires cueing (pt has dementia) Oral Cavity - Dentition: Edentulous Self-Feeding Abilities: Able to feed self Patient Positioning: Upright in bed Baseline Vocal Quality: Low vocal intensity;Hoarse Volitional Cough: Weak;Congested Volitional  Swallow: Unable to elicit    Oral/Motor/Sensory Function Overall Oral Motor/Sensory Function: Impaired (gross weakness)   Ice Chips Ice chips: Not tested   Thin Liquid Thin Liquid: Impaired Presentation: Cup Oral Phase Functional Implications: Prolonged oral transit Pharyngeal  Phase Impairments: Suspected delayed Swallow;Multiple swallows Other Comments: suspect subtle throat clearing as pt with phonation after swallow    Nectar Thick Nectar Thick Liquid: Impaired Presentation: Cup Oral phase functional implications: Prolonged oral transit Pharyngeal Phase Impairments: Suspected delayed Swallow;Multiple swallows   Honey Thick Honey Thick Liquid: Not tested   Puree Puree: Impaired Presentation: Spoon Oral Phase Functional Implications: Prolonged oral transit Pharyngeal Phase Impairments: Suspected delayed Swallow;Multiple swallows   Solid   GO    Solid: Not tested      Donavan Burnet, MS [facility] SLP [phone]

## 2012-07-05 NOTE — Consult Note (Signed)
WOC consult Note Reason for Consult: evaluation of toe tip ulcer. Neuropathic foot ulcer great toe surface Wound type:neuropathic foot ulceration Measurement: 1.0cm x 1.0cm x 0.5cm  Wound bed: dry, pale Drainage (amount, consistency, odor) none Periwound:hyperkeratosis, needs serial debridements from regular podiatry or wound care center Dressing procedure/placement/frequency: hydrogel M/W/F (lawson [phone]), apply to wound, cover with dry dressing, wrap with conform or gauze.   Re consult if needed, will not follow at this time. Thanks  Chrishaun Sasso [employer], CWOCN [phone])

## 2012-07-06 ENCOUNTER — Inpatient Hospital Stay (HOSPITAL_COMMUNITY): Payer: Medicare Other

## 2012-07-06 LAB — URINE CULTURE: Colony Count: >=100000

## 2012-07-06 LAB — GLUCOSE, CAPILLARY: Glucose-Capillary: 72 mg/dL (ref 70–99)

## 2012-07-06 LAB — CK TOTAL AND CKMB (NOT AT ARMC)
CK, MB: 5.8 ng/mL — ABNORMAL HIGH (ref 0.3–4.0)
Relative Index: 2.5 (ref 0.0–2.5)

## 2012-07-06 LAB — BASIC METABOLIC PANEL
BUN: 45 mg/dL — ABNORMAL HIGH (ref 6–23)
Chloride: 107 mEq/L (ref 96–112)
GFR calc Af Amer: 23 mL/min — ABNORMAL LOW (ref >90)
Glucose, Bld: 79 mg/dL (ref 70–99)
Potassium: 3.7 mEq/L (ref 3.5–5.1)
Sodium: 135 mEq/L (ref 135–145)

## 2012-07-06 LAB — CBC
HCT: 22.6 % — ABNORMAL LOW (ref 39.0–52.0)
Hemoglobin: 7.8 g/dL — ABNORMAL LOW (ref 13.0–17.0)
WBC: 24.4 10*3/uL — ABNORMAL HIGH (ref 4.0–10.5)

## 2012-07-06 MED ORDER — DEXTROSE-NACL 5-0.9 % IV SOLN
INTRAVENOUS | Status: DC
  Administered 2012-07-06 – 2012-07-08 (×4): via INTRAVENOUS

## 2012-07-06 MED ORDER — BIOTENE DRY MOUTH MT LIQD
15.0000 mL | Freq: Two times a day (BID) | OROMUCOSAL | Status: DC
  Administered 2012-07-07 – 2012-07-13 (×10): 15 mL via OROMUCOSAL

## 2012-07-06 MED ORDER — CHLORHEXIDINE GLUCONATE 0.12 % MT SOLN
15.0000 mL | Freq: Two times a day (BID) | OROMUCOSAL | Status: DC
  Administered 2012-07-06 – 2012-07-14 (×12): 15 mL via OROMUCOSAL
  Filled 2012-07-06 (×18): qty 15

## 2012-07-06 MED ORDER — DEXTROSE 5 % IV SOLN
1.0000 g | Freq: Every day | INTRAVENOUS | Status: DC
  Administered 2012-07-06 – 2012-07-14 (×9): 1 g via INTRAVENOUS
  Filled 2012-07-06 (×9): qty 10

## 2012-07-06 NOTE — Evaluation (Signed)
Occupational Therapy Evaluation Patient Details Name: [name] MRN: [id number] DOB: [birth year] Today's Date: 07/06/2012 Time:  -     OT Assessment / Plan / Recommendation History of present illness     Clinical Impression   Pt is a 67 yo male h/o dementia, DM, stroke, dysphagia, falls sent from SNF for fever and diagnosed with L LE cellulitis. Pt unable to provide PLOF, however able to participate with visual and physical cues for mobility and simple ADLs. Pt would benefit from acute OT services to address impairments to increase Ind and level of function for ADLs and ADL mobility safety   OT Assessment  Patient needs continued OT Services    Follow Up Recommendations  SNF    Barriers to Discharge   Pt resident of a SNF  Equipment Recommendations       Recommendations for Other Services    Frequency  Min 2X/week    Precautions / Restrictions Precautions Precautions: Fall Restrictions Weight Bearing Restrictions: Yes  R toe ulcer      ADL  Grooming: Performed;Set up;Supervision/safety Upper Body Bathing: Simulated;Minimal assistance Lower Body Bathing: +1 Total assistance Upper Body Dressing: Performed;Minimal assistance Lower Body Dressing: +1 Total assistance Toilet Transfer: Simulated;+2 Total assistance Toilet Transfer Method: Sit to stand Equipment Used: Rolling walker;Gait belt ADL Comments: Pt unable to provide PLOF, required physical  and visual cues to initiate functional tasks    OT Diagnosis: Generalized weakness;Cognitive deficits  OT Problem List: Decreased strength;Decreased cognition;Decreased activity tolerance;Decreased safety awareness;Impaired balance (sitting and/or standing);Decreased knowledge of precautions OT Treatment Interventions: Self-care/ADL training;Balance training;Therapeutic exercise;Neuromuscular education;Therapeutic activities;Patient/family education   OT Goals(Current goals can be found in the care plan section) Acute  Rehab OT Goals Patient Stated Goal: None stated OT Goal Formulation: With patient Time For Goal Achievement: 07/13/12 Potential to Achieve Goals: Good ADL Goals Pt Will Perform Grooming: with min assist;standing Pt Will Perform Upper Body Bathing: with set-up;with supervision;sitting Pt Will Perform Upper Body Dressing: with set-up;with supervision;sitting Pt Will Transfer to Toilet: with max assist;stand pivot transfer;regular height toilet;bedside commode;ambulating Pt Will Perform Toileting - Clothing Manipulation and hygiene: with max assist;sit to/from stand  Visit Information  Last OT Received On: 07/06/12 Assistance Needed: +2 PT/OT Co-Evaluation/Treatment: Yes       Prior Functioning     Home Living Family/patient expects to be discharged to:: Skilled nursing facility Prior Function Level of Independence: Needs assistance Comments: pt unable to provide but suspect required assist  Communication Communication: Receptive difficulties;Expressive difficulties;HOH         Vision/Perception Vision - History Baseline Vision: Other (comment) (unknown) Patient Visual Report: No change from baseline Perception Perception: Within Functional Limits   Cognition  Cognition Arousal/Alertness: Awake/alert Behavior During Therapy: WFL for tasks assessed/performed Overall Cognitive Status: History of cognitive impairments - at baseline    Extremity/Trunk Assessment Upper Extremity Assessment Upper Extremity Assessment: Overall WFL for tasks assessed;Generalized weakness     Mobility Bed Mobility Bed Mobility: Supine to Sit Supine to Sit: 1: +2 Total assist Supine to Sit: Patient Percentage: 40% Details for Bed Mobility Assistance: pt required increased visual and physical cues due to cognition/HOH as well as increased time Transfers Transfers: Sit to Stand;Stand to Sit Sit to Stand: 1: +2 Total assist;From bed Sit to Stand: Patient Percentage: 40% Stand to Sit: 1:  +2 Total assist;To chair/3-in-1 Stand to Sit: Patient Percentage: 40% Details for Transfer Assistance: pt required increased visual and physical cues due to cognition/HOH, increased assist for weakness  Exercise     Balance Balance Balance Assessed: No   End of Session OT - End of Session Equipment Utilized During Treatment: Gait belt;Rolling walker Activity Tolerance: Patient limited by lethargy;Patient tolerated treatment well Patient left: in chair;with call bell/phone within reach Nurse Communication: Mobility status;Need for lift equipment  GO     Galen Manila 07/06/2012, 1:37 PM

## 2012-07-06 NOTE — Plan of Care (Signed)
Problem: Phase II Progression Outcomes Goal: Discharge plan established Recommend return to SNF for further therapy and care after acute care stay

## 2012-07-06 NOTE — Evaluation (Signed)
Physical Therapy Evaluation Patient Details Name: [name] MRN: [id number] DOB: 10-07-1945 Today's Date: 07/06/2012 Time: 4098-1191 PT Time Calculation (min): 19 min  PT Assessment / Plan / Recommendation History of Present Illness     Clinical Impression  67 yo male h/o dementia, DM, stroke, dysphagia, falls sent from SNF for fever and diagnosed with L LE cellulitis.  Pt unable to provide PLOF however able to participate with visual and physical cues for mobility.  Pt would benefit from acute PT services in order to improve independence with transfers and ambulation in preparation for d/c to SNF.     PT Assessment  Patient needs continued PT services    Follow Up Recommendations  SNF;Supervision/Assistance - 24 hour    Does the patient have the potential to tolerate intense rehabilitation      Barriers to Discharge        Equipment Recommendations  Other (comment) (to be determined by SNF)    Recommendations for Other Services     Frequency Min 3X/week    Precautions / Restrictions Precautions Precautions: Fall   Pertinent Vitals/Pain No signs of distress.  Lungs sound congested so pt assisted to recliner to sit upright.      Mobility  Bed Mobility Bed Mobility: Supine to Sit Supine to Sit: 1: +2 Total assist Supine to Sit: Patient Percentage: 40% Details for Bed Mobility Assistance: pt required increased visual and physical cues due to cognition/HOH as well as increased time Transfers Transfers: Sit to Stand;Stand to Sit;Stand Pivot Transfers Sit to Stand: 1: +2 Total assist;From bed Sit to Stand: Patient Percentage: 40% Stand to Sit: 1: +2 Total assist;To chair/3-in-1 Stand to Sit: Patient Percentage: 40% Stand Pivot Transfers: 1: +2 Total assist Stand Pivot Transfers: Patient Percentage: 40% Details for Transfer Assistance: pt required increased visual and physical cues due to cognition/HOH, increased assist for  weakness Ambulation/Gait Ambulation/Gait Assistance: Not tested (comment)    Exercises     PT Diagnosis: Difficulty walking;Generalized weakness  PT Problem List: Decreased strength;Decreased activity tolerance;Decreased mobility;Decreased safety awareness;Decreased knowledge of use of DME PT Treatment Interventions: DME instruction;Gait training;Functional mobility training;Therapeutic activities;Therapeutic exercise;Patient/family education     PT Goals(Current goals can be found in the care plan section) Acute Rehab PT Goals PT Goal Formulation: Patient unable to participate in goal setting Time For Goal Achievement: 07/13/12 Potential to Achieve Goals: Fair  Visit Information  Last PT Received On: 07/06/12 Assistance Needed: +2 PT/OT Co-Evaluation/Treatment: Yes       Prior Functioning  Home Living Family/patient expects to be discharged to:: Skilled nursing facility Prior Function Level of Independence: Needs assistance Comments: pt unable to provide but suspect required assist  Communication Communication: Receptive difficulties;Expressive difficulties;HOH    Cognition  Cognition Arousal/Alertness: Awake/alert Overall Cognitive Status: History of cognitive impairments - at baseline    Extremity/Trunk Assessment Upper Extremity Assessment Upper Extremity Assessment: Defer to OT evaluation Lower Extremity Assessment Lower Extremity Assessment: Generalized weakness;Difficult to assess due to impaired cognition;RLE deficits/detail RLE Deficits / Details: appears functionally more weak on R during transfers (even though L cellulits and toe wound)   Balance    End of Session PT - End of Session Equipment Utilized During Treatment: Gait belt Activity Tolerance: Patient tolerated treatment well Patient left: in chair;with call bell/phone within reach;with nursing/sitter in room Nurse Communication: Need for lift equipment  GP     Jacquiline Zurcher,KATHrine E 07/06/2012, 1:19  PM Zenovia Jarred, PT, DPT 07/06/2012 Pager: [phone]

## 2012-07-06 NOTE — Progress Notes (Addendum)
[facility] PROGRESS NOTE  [name] ZOX:[id number] DOB: [birth year] DOA: 07/03/2012 PCP: Terald Sleeper, MD  Assessment/Plan: 1-Cellulitis of LLE -continue IV clindamycin day 3. -dopplers negative for  DVT   2. Leukocytosis  -increasing.  -Patient with diarrhea will check for C diff. Others possibility aspiration PNA. Clindamycin should cover for aspiration PNA.  -CXr with atelectasis 6-23 -UA not highly suggestive of UTI, received 1 dose of ceftriaxone at SNF. -Urine grew E coli. Will add ceftriaxone.   3. ARF on CKD: baseline creatinine 1.4  -Component of rhabdomyolysis. Total Ck decrease to 229.  -Continue with IV fluids. hold lasix  -renal US: No acute urinary pathology. Mild cortical thinning bilaterally. -good urine out put. Cr trending down. Today at 3.  4. Hyperkalemia  -Resolved.  -Received kayexalate and insulin.   5. Dementia: moderately advanced  -stable  -continue aricept, seroquel   6. DM  -continue levemir, SSI   7. DVt proph: lovenox 8-UTI, Urine grew 100-K E coli. Will start Ceftriaxone.  9-Anemia: monitor for bleed.  10-Dysphagia: NPO. Repeat swallow evaluation today.   Code Status: Full Code.  Family Communication: none at bedside.  Disposition Plan: Back to SNF when stable.    Consultants:  Wound care nurse.   Procedures:  Doppler LE negative for DVT.   Antibiotics:  Clindamycin 6-24   Ceftriaxone 6-26  HPI/Subjective: Hard of hearing, difficult to understand. No complaints.   Objective: Filed Vitals:   07/05/12 0615 07/05/12 1412 07/05/12 2124 07/06/12 0622  BP: 116/48 138/63 100/50 122/51  Pulse: 66 75 73 64  Temp: 98.2 F (36.8 C) 97.4 F (36.3 C) 98.2 F (36.8 C) 97.8 F (36.6 C)  TempSrc: Oral Oral Oral Oral  Resp: 18 18 18 16   Height:      Weight:      SpO2: 97% 95% 98% 100%    Intake/Output Summary (Last 24 hours) at 07/06/12 0820 Last data filed at 07/06/12 0700  Gross per 24 hour  Intake    2975 ml  Output   2225 ml  Net    750 ml   Filed Weights   07/04/12 0200  Weight: 100.8 kg (222 lb 3.6 oz)    Exam:   General:  No distress.   Cardiovascular:S 1, S 2 RRR  Respiratory: Bilateral ronchus.   Abdomen: Bs present, soft, NT  Musculoskeletal: left LE with erythema, edema. Left big toe with ulcer   Data Reviewed: Basic Metabolic Panel:  Recent Labs Lab 07/04/12 0450 07/04/12 0630 07/04/12 1500 07/05/12 0442 07/06/12 0525  NA 129* 129* 130* 130* 135  K 6.4* 6.0* 5.3* 5.1 3.7  CL 99 98 97 101 107  CO2 17* 18* 18* 19 18*  GLUCOSE 119* 131* 164* 125* 79  BUN 33* 35* 39* 47* 45*  CREATININE 4.10* 4.19* 4.06* 3.92* 3.01*  CALCIUM 9.6 9.4 9.4 9.1 8.6   Liver Function Tests: No results found for this basename: AST, ALT, ALKPHOS, BILITOT, PROT, ALBUMIN,  in the last 168 hours No results found for this basename: LIPASE, AMYLASE,  in the last 168 hours No results found for this basename: AMMONIA,  in the last 168 hours CBC:  Recent Labs Lab 07/03/12 2100 07/04/12 0450 07/05/12 0442 07/06/12 0525  WBC 16.8* 20.6* 18.9* 24.4*  NEUTROABS 15.2*  --   --   --   HGB 10.3* 10.4* 9.0* 7.8*  HCT 31.3* 31.6* 26.7* 22.6*  MCV 80.9 80.2 78.5 76.9*  PLT 178 151 140* 148*   Cardiac  Enzymes:  Recent Labs Lab 07/04/12 0630 07/06/12 0525  CKTOTAL 1457* 229  CKMB  --  5.8*   BNP (last 3 results) No results found for this basename: PROBNP,  in the last 8760 hours CBG:  Recent Labs Lab 07/05/12 0739 07/05/12 1132 07/05/12 1631 07/05/12 2122 07/06/12 0748  GLUCAP 118* 124* 132* 120* 82    Recent Results (from the past 240 hour(s))  CULTURE, BLOOD (ROUTINE X 2)     Status: None   Collection Time    07/03/12  9:00 PM      Result Value Range Status   Specimen Description BLOOD LEFT ANTECUBITAL   Final   Special Requests BOTTLES DRAWN AEROBIC AND ANAEROBIC 3CC   Final   Culture  Setup Time 07/04/2012 04:42   Final   Culture     Final   Value:         BLOOD CULTURE RECEIVED NO GROWTH TO DATE CULTURE WILL BE HELD FOR 5 DAYS BEFORE ISSUING A FINAL NEGATIVE REPORT   Report Status PENDING   Incomplete  URINE CULTURE     Status: None   Collection Time    07/03/12  9:12 PM      Result Value Range Status   Specimen Description URINE, CATHETERIZED   Final   Special Requests NONE   Final   Culture  Setup Time 07/04/2012 04:07   Final   Colony Count >=100,000 COLONIES/ML   Final   Culture ESCHERICHIA COLI   Final   Report Status PENDING   Incomplete  CULTURE, BLOOD (ROUTINE X 2)     Status: None   Collection Time    07/03/12 10:30 PM      Result Value Range Status   Specimen Description BLOOD LEFT HAND   Final   Special Requests BOTTLES DRAWN AEROBIC AND ANAEROBIC 3CC   Final   Culture  Setup Time 07/04/2012 01:32   Final   Culture     Final   Value:        BLOOD CULTURE RECEIVED NO GROWTH TO DATE CULTURE WILL BE HELD FOR 5 DAYS BEFORE ISSUING A FINAL NEGATIVE REPORT   Report Status PENDING   Incomplete  MRSA PCR SCREENING     Status: None   Collection Time    07/04/12  2:44 AM      Result Value Range Status   MRSA by PCR NEGATIVE  NEGATIVE Final   Comment:            The GeneXpert MRSA Assay (FDA     approved for NASAL specimens     only), is one component of a     comprehensive MRSA colonization     surveillance program. It is not     intended to diagnose MRSA     infection nor to guide or     monitor treatment for     MRSA infections.     Studies: US Renal  07/04/2012   *RADIOLOGY REPORT*  Clinical Data: Acute renal failure  RENAL/URINARY TRACT ULTRASOUND COMPLETE  Comparison:  09/06/2007  Findings:  Right Kidney:  11.5 cm in length.  Normal cortical echogenicity. No hydronephrosis or mass.  Mild cortical thinning.  Left Kidney:  11.0 cm in length.  No hydronephrosis or mass. Normal cortical echogenicity.  Mild cortical thinning.  Bladder:  Within normal limits.  IMPRESSION: No acute urinary pathology.  Mild cortical thinning  bilaterally.   Original Report Authenticated By: Jolaine Click, M.D.    Scheduled Meds: .  aspirin EC  81 mg Oral Daily  . clindamycin (CLEOCIN) IV  600 mg Intravenous Q8H  . donepezil  10 mg Oral QHS  . enoxaparin (LOVENOX) injection  30 mg Subcutaneous Q24H  . insulin aspart  0-9 Units Subcutaneous TID WC  . insulin detemir  5 Units Subcutaneous QHS  . QUEtiapine  25 mg Oral Daily  . sertraline  150 mg Oral Daily  . sodium chloride  1,000 mL Intravenous Once  . sodium chloride  3 mL Intravenous Q12H  . traZODone  25 mg Oral Daily   Continuous Infusions: . sodium chloride 100 mL/hr at 07/05/12 0137  . sodium chloride 125 mL/hr at 07/06/12 1610    Principal Problem:   Acute renal failure Active Problems:   DM   Alzheimer's disease   Anemia in chronic kidney disease(285.21)   UTI (lower urinary tract infection)   Cellulitis and abscess of leg    Time spent: 35 minutes.     [name]  [facility] Pager [phone]. If 7PM-7AM, please contact night-coverage at [email or web address], password [facility] 07/06/2012, 8:20 AM  LOS: 3 days

## 2012-07-06 NOTE — Procedures (Signed)
Objective Swallowing Evaluation: Modified Barium Swallowing Study  Patient Details  Name: [name] MRN: [id number] Date of Birth: [birth year]  Today's Date: 07/06/2012 Time: 1340-1410 SLP Time Calculation (min): 30 min  Past Medical History:  Past Medical History  Diagnosis Date  . Hyponatremia   . Hypokalemia   . Anemia   . Diabetes mellitus without complication   . Hypertension   . UTI (lower urinary tract infection)   . Delusional disorder   . HOH (hard of hearing)   . Hyperlipidemia   . CHF (congestive heart failure)   . Renal insufficiency   . Stroke   . Dementia   . Depression   . Dysphagia   . Falls frequently    Past Surgical History:  Past Surgical History  Procedure Laterality Date  . Colon surgery    . Colectomy    . Ileostomy    . Peg tube removal    . Cardiac catheterization    . Cholecystectomy    . Abdominal adhesion surgery     HPI:  67 yo male adm to [facility] from [facility] with cellulitis of left lower extremity.  Pt with leukocytosis, hyperkalemia, dementia, DM.   Pt passed an RN stroke swallow screen although he has h/o severe dysphagia and has undergone multiple MBS studies.  Bedside swallow evaluation recommended NPO except meds, with repeat MBS to objectively assess swallow function and safety.     Assessment / Plan / Recommendation Clinical Impression  Dysphagia Diagnosis: Severe pharyngeal phase dysphagia;Severe oral phase dysphagia Clinical impression: Pt exhibits extended oral holding with posterior spill to vallecula/pyriform and airway.  No swallow reflex was elicited, and no cough response was observed following aspiration.  Voice quality was wet, and pt unable to follow directions to clear throat or cough.  Pt would not open mouth sufficiently for oral suctioning to remove barium from oral cavity.  Pt is currently inappropriate for any po intake, including medications.      Treatment Recommendation  Therapy as outlined in treatment  plan below    Diet Recommendation NPO   Medication Administration: Via alternative means    Other  Recommendations  Consider nonoral means of nutrition/hydration and medication.  Follow Up Recommendations  Skilled Nursing facility    Frequency and Duration min 1 x/week  1 week   Pertinent Vitals/Pain None indicated    SLP Swallow Goals Goal #3: Pt will cough on command and clear secretions with max assistance to aid airway protection and help determine readiness for MBS.   Swallow Study Goal #3 - Progress: Progressing toward goal   General Date of Onset: 07/05/12 HPI: 67 yo male adm to [facility] from [facility] with cellulitis of left lower extremity.  Pt with leukocytosis, hyperkalemia, dementia, DM.   Pt passed an RN stroke swallow screen although he has h/o severe dysphagia and has undergone multiple MBS studies.  Bedside swallow evaluation recommended NPO except meds, with repeat MBS to objectively assess swallow function and safety. Type of Study: Modified Barium Swallowing Study Reason for Referral: Objectively evaluate swallowing function Previous Swallow Assessment: BSE Wednesday 07/05/12 Diet Prior to this Study: NPO Temperature Spikes Noted: No Respiratory Status: Room air History of Recent Intubation: No Behavior/Cognition: Alert;Doesn't follow directions;Decreased sustained attention;Requires cueing Oral Cavity - Dentition: Edentulous Patient Positioning: Upright in chair Baseline Vocal Quality: Low vocal intensity;Wet Volitional Cough: Cognitively unable to elicit Volitional Swallow: Unable to elicit Anatomy: Within functional limits Pharyngeal Secretions: Not observed secondary MBS    Reason  for Referral Objectively evaluate swallowing function   Oral Phase Oral Preparation/Oral Phase Oral Phase: Impaired Oral - Thin Oral - Thin Cup: Lingual pumping;Holding of bolus;Reduced posterior propulsion;Delayed oral transit Oral - Solids Oral - Puree: Lingual  pumping;Holding of bolus;Reduced posterior propulsion   Pharyngeal Phase Pharyngeal Phase Pharyngeal Phase: Impaired Pharyngeal - Thin Pharyngeal - Thin Cup: Delayed swallow initiation;Premature spillage to valleculae;Premature spillage to pyriform sinuses;Penetration/Aspiration before swallow;Reduced airway/laryngeal closure Penetration/Aspiration details (thin cup): Material enters airway, passes BELOW cords without attempt by patient to eject out (silent aspiration) Pharyngeal - Solids Pharyngeal - Puree: Premature spillage to valleculae;Premature spillage to pyriform sinuses;Reduced airway/laryngeal closure;Delayed swallow initiation;Penetration/Aspiration before swallow Penetration/Aspiration details (puree): Material enters airway, passes BELOW cords without attempt by patient to eject out (silent aspiration)  Cervical Esophageal Phase       Lacey Wallman B. Prentiss, [facility], CCC-SLP [phone]            Leigh Aurora 07/06/2012, 2:21 PM

## 2012-07-07 ENCOUNTER — Inpatient Hospital Stay (HOSPITAL_COMMUNITY): Payer: Medicare Other

## 2012-07-07 DIAGNOSIS — R633 Feeding difficulties: Secondary | ICD-10-CM

## 2012-07-07 DIAGNOSIS — R1314 Dysphagia, pharyngoesophageal phase: Secondary | ICD-10-CM

## 2012-07-07 LAB — BASIC METABOLIC PANEL
CO2: 18 mEq/L — ABNORMAL LOW (ref 19–32)
Calcium: 8.6 mg/dL (ref 8.4–10.5)
Chloride: 111 mEq/L (ref 96–112)
Glucose, Bld: 104 mg/dL — ABNORMAL HIGH (ref 70–99)
Sodium: 139 mEq/L (ref 135–145)

## 2012-07-07 LAB — CBC
Hemoglobin: 8.2 g/dL — ABNORMAL LOW (ref 13.0–17.0)
MCH: 26.7 pg (ref 26.0–34.0)
Platelets: 166 10*3/uL (ref 150–400)
RBC: 3.07 MIL/uL — ABNORMAL LOW (ref 4.22–5.81)
WBC: 22.5 10*3/uL — ABNORMAL HIGH (ref 4.0–10.5)

## 2012-07-07 LAB — GLUCOSE, CAPILLARY: Glucose-Capillary: 96 mg/dL (ref 70–99)

## 2012-07-07 MED ORDER — ENOXAPARIN SODIUM 40 MG/0.4ML ~~LOC~~ SOLN
40.0000 mg | SUBCUTANEOUS | Status: DC
  Administered 2012-07-08 – 2012-07-12 (×5): 40 mg via SUBCUTANEOUS
  Filled 2012-07-07 (×5): qty 0.4

## 2012-07-07 NOTE — Progress Notes (Signed)
ANTIBIOTIC CONSULT NOTE - FOLLOW UP  Pharmacy Consult for Clindamycin Indication: Cellulitis of LLE  No Known Allergies  Patient Measurements: Height: 5\' 10"  (177.8 cm) Weight: 222 lb 3.6 oz (100.8 kg) IBW/kg (Calculated) : 73  Vital Signs: Temp: 98.4 F (36.9 C) (06/27 0529) Temp src: Oral (06/27 0529) BP: 120/53 mmHg (06/27 0529) Pulse Rate: 64 (06/27 0529) Intake/Output from previous day: 06/26 0701 - 06/27 0700 In: 1563.3 [I.V.:1563.3] Out: 2830 [Urine:2105; Stool:725] Intake/Output from this shift:    Labs:  Recent Labs  07/05/12 0442 07/06/12 0525 07/07/12 0503  WBC 18.9* 24.4* 22.5*  HGB 9.0* 7.8* 8.2*  PLT 140* 148* 166  CREATININE 3.92* 3.01* 2.50*   Estimated Creatinine Clearance: 34.1 ml/min (by C-G formula based on Cr of 2.5). No results found for this basename: VANCOTROUGH, VANCOPEAK, VANCORANDOM, GENTTROUGH, GENTPEAK, GENTRANDOM, TOBRATROUGH, TOBRAPEAK, TOBRARND, AMIKACINPEAK, AMIKACINTROU, AMIKACIN,  in the last 72 hours   Microbiology: Recent Results (from the past 720 hour(s))  CULTURE, BLOOD (ROUTINE X 2)     Status: None   Collection Time    07/03/12  9:00 PM      Result Value Range Status   Specimen Description BLOOD LEFT ANTECUBITAL   Final   Special Requests BOTTLES DRAWN AEROBIC AND ANAEROBIC 3CC   Final   Culture  Setup Time 07/04/2012 04:42   Final   Culture     Final   Value:        BLOOD CULTURE RECEIVED NO GROWTH TO DATE CULTURE WILL BE HELD FOR 5 DAYS BEFORE ISSUING A FINAL NEGATIVE REPORT   Report Status PENDING   Incomplete  URINE CULTURE     Status: None   Collection Time    07/03/12  9:12 PM      Result Value Range Status   Specimen Description URINE, CATHETERIZED   Final   Special Requests NONE   Final   Culture  Setup Time 07/04/2012 04:07   Final   Colony Count >=100,000 COLONIES/ML   Final   Culture     Final   Value: ESCHERICHIA COLI     Note: Two isolates with different morphologies were identified as the same  organism.The most resistant organism was reported.   Report Status 07/06/2012 FINAL   Final   Organism ID, Bacteria ESCHERICHIA COLI   Final  CULTURE, BLOOD (ROUTINE X 2)     Status: None   Collection Time    07/03/12 10:30 PM      Result Value Range Status   Specimen Description BLOOD LEFT HAND   Final   Special Requests BOTTLES DRAWN AEROBIC AND ANAEROBIC 3CC   Final   Culture  Setup Time 07/04/2012 01:32   Final   Culture     Final   Value:        BLOOD CULTURE RECEIVED NO GROWTH TO DATE CULTURE WILL BE HELD FOR 5 DAYS BEFORE ISSUING A FINAL NEGATIVE REPORT   Report Status PENDING   Incomplete  MRSA PCR SCREENING     Status: None   Collection Time    07/04/12  2:44 AM      Result Value Range Status   MRSA by PCR NEGATIVE  NEGATIVE Final   Comment:            The GeneXpert MRSA Assay (FDA     approved for NASAL specimens     only), is one component of a     comprehensive MRSA colonization     surveillance program. It is  not     intended to diagnose MRSA     infection nor to guide or     monitor treatment for     MRSA infections.  CLOSTRIDIUM DIFFICILE BY PCR     Status: None   Collection Time    07/06/12 11:22 AM      Result Value Range Status   C difficile by pcr NEGATIVE  NEGATIVE Final    Anti-infectives   Start     Dose/Rate Route Frequency Ordered Stop   07/06/12 0900  cefTRIAXone (ROCEPHIN) 1 g in dextrose 5 % 50 mL IVPB     1 g 100 mL/hr over 30 Minutes Intravenous Daily 07/06/12 0827     07/04/12 1000  clindamycin (CLEOCIN) IVPB 600 mg  Status:  Discontinued     600 mg 100 mL/hr over 30 Minutes Intravenous Every 12 hours 07/04/12 0151 07/04/12 0237   07/04/12 0600  clindamycin (CLEOCIN) IVPB 600 mg     600 mg 100 mL/hr over 30 Minutes Intravenous 3 times per day 07/04/12 0237     07/03/12 2115  clindamycin (CLEOCIN) IVPB 600 mg     600 mg 100 mL/hr over 30 Minutes Intravenous  Once 07/03/12 2100 07/03/12 2210      Assessment: [name] presents from NH with  fever found to have cellulitis of LLE and E.coli UTI.  Day #5 Clindamycin 600 mg IV q8h for cellulitis.  Pharmacy dosing.  Day #2 Ceftriaxone 1g IV q24h for UTI.  Acute on chronic renal failure, slowly improving.  CrCl~34 ml/min.  Afebrile, WBC remains elevated at 22.5  Goal of Therapy:  Eradication of infection  Plan:  Continue Clindamycin 600 mg IV q8h.  Clance Boll 07/07/2012,8:04 AM

## 2012-07-07 NOTE — Progress Notes (Signed)
Clinical Social Work Department BRIEF PSYCHOSOCIAL ASSESSMENT 07/07/2012  Patient:  [name], [name]     Account Number:  [id number]     Admit date:  07/03/2012  Clinical Social Worker:  Hattie Perch  Date/Time:  07/07/2012 12:00 M  Referred by:  Physician  Date Referred:  07/07/2012 Referred for  SNF Placement   Other Referral:   Interview type:  Family Other interview type:    PSYCHOSOCIAL DATA Living Status:  FACILITY Admitted from facility:  [facility] Level of care:  Skilled Nursing Facility Primary support name:  Jeannie Done Primary support relationship to patient:  SIBLING Degree of support available:   good    CURRENT CONCERNS Current Concerns  Post-Acute Placement   Other Concerns:    SOCIAL WORK ASSESSMENT / PLAN CSW met with patient. patient is unable to participate in assessment. CSW called patient's sister, Koleen Nimrod, left voicemail. CSW spoke with [facility]. they confirm patient is a residen and can return there upon discharge.   Assessment/plan status:   Other assessment/ plan:   Information/referral to community resources:    PATIENT'S/FAMILY'S RESPONSE TO PLAN OF CARE:

## 2012-07-07 NOTE — Progress Notes (Signed)
Assumed care for patient. No changes in initial AM assessment. Pt condition stable cont with current plan of care

## 2012-07-07 NOTE — Care Management Note (Addendum)
    Page 1 of 2   07/10/2012     1:55:46 PM   CARE MANAGEMENT NOTE 07/10/2012  Patient:  [name], [name]   Account Number:  [id number]  Date Initiated:  07/04/2012  Documentation initiated by:  DAVIS,RHONDA  Subjective/Objective Assessment:   pt from local snf with fever, renal failure with abnormal labs elevated lactic acid.     Action/Plan:   snf when stable-[facility]   Anticipated DC Date:  07/13/2012   Anticipated DC Plan:  SKILLED NURSING FACILITY  In-house referral  Clinical Social Worker      DC Planning Services  NA      [facility] Choice  NA   Choice offered to / List presented to:  NA   DME arranged  NA      DME agency  NA     HH arranged  NA      HH agency  NA   Status of service:  In process, will continue to follow Medicare Important Message given?  NA - LOS <3 / Initial given by admissions (If response is "NO", the following Medicare IM given date fields will be blank) Date Medicare IM given:   Date Additional Medicare IM given:    Discharge Disposition:    Per UR Regulation:  Reviewed for med. necessity/level of care/duration of stay  If discussed at Long Length of Stay Meetings, dates discussed:    Comments:  07/10/12 Kalai Baca RN,BSN NCM [phone] CELLULITIS IMPROVING,IV ABX,UTI-IV ABX,DYSPHAGIA,REPEAT SWALLOW.D/C PLAN SNF.  07/07/12 Nelton Amsden RN,BSN NCM [phone] NOTED RENAL FUNCTION IMPROVING,LLE CELLULITIS,UTI-IV ABX,SEVERE DYSPHAGIA,FAILED SWALLOW,NPO.D/C PLAN RETURN SNF.  [id number] Earlene Plater, RN, BSN, CCM:  CHART REVIEWED AND UPDATED.  Next chart review due on [id number]. NO DISCHARGE NEEDS PRESENT AT THIS TIME. CASE MANAGEMENT [phone]

## 2012-07-07 NOTE — Progress Notes (Signed)
[facility] PROGRESS NOTE  [name] ZOX:[id number] DOB: 09-21-1945 DOA: 07/03/2012 PCP: Terald Sleeper, MD  Assessment/Plan:  1-Cellulitis of LLE -continue IV clindamycin day 4. -dopplers negative for  DVT   2. Leukocytosis  -trending down today.  -Others possibility aspiration PNA. Clindamycin should cover for aspiration PNA. Will repeat Chest x ray today.  -CXr with atelectasis 6-23.  -Urine grew E coli. Continue with ceftriaxone day 2.   3. ARF on CKD: baseline creatinine 1.4  -Component of rhabdomyolysis. Total Ck decrease to 229.  -Continue with IV fluids. hold lasix  -renal US: No acute urinary pathology. Mild cortical thinning bilaterally. -good urine out put. Cr trending down. Today at 2.  4. Hyperkalemia  -Resolved.  -Received kayexalate and insulin.  5. Dementia: moderately advanced  -stable  -continue aricept, seroquel  6. DM  -continue levemir, SSI  7. DVt proph: lovenox 8-UTI, Urine grew 100-K E coli sensitive to  ceftriaxone. Continue with ceftriaxone day 2. 9-Anemia: monitor for bleed.  10-Dysphagia: NPO. Patient with severe oral  pharyngeal dysphagia.   Code Status: Full Code.  Family Communication: will continue to try to contact sister.  Disposition Plan: Back to SNF when stable.    Consultants:  Wound care nurse.   Procedures:  Doppler LE negative for DVT.   Antibiotics:  Clindamycin 6-24   Ceftriaxone 6-26  HPI/Subjective: Hard of hearing, difficult to understand. No complaints.   Objective: Filed Vitals:   07/06/12 0622 07/06/12 1300 07/06/12 2058 07/07/12 0529  BP: 122/51 120/54 124/47 120/53  Pulse: 64 76 77 64  Temp: 97.8 F (36.6 C) 97.3 F (36.3 C) 97.7 F (36.5 C) 98.4 F (36.9 C)  TempSrc: Oral Oral Oral Oral  Resp: 16 22 28 20   Height:      Weight:      SpO2: 100% 97% 97% 100%    Intake/Output Summary (Last 24 hours) at 07/07/12 1236 Last data filed at 07/07/12 0700  Gross per 24 hour  Intake  1563.33 ml  Output   2080 ml  Net -516.67 ml   Filed Weights   07/04/12 0200  Weight: 100.8 kg (222 lb 3.6 oz)    Exam:   General:  No distress.   Cardiovascular:S 1, S 2 RRR  Respiratory: Bilateral ronchus.   Abdomen: Bs present, soft, NT  Musculoskeletal: left LE with erythema, edema. Left big toe with ulcer   Data Reviewed: Basic Metabolic Panel:  Recent Labs Lab 07/04/12 0630 07/04/12 1500 07/05/12 0442 07/06/12 0525 07/07/12 0503  NA 129* 130* 130* 135 139  K 6.0* 5.3* 5.1 3.7 3.7  CL 98 97 101 107 111  CO2 18* 18* 19 18* 18*  GLUCOSE 131* 164* 125* 79 104*  BUN 35* 39* 47* 45* 40*  CREATININE 4.19* 4.06* 3.92* 3.01* 2.50*  CALCIUM 9.4 9.4 9.1 8.6 8.6   Liver Function Tests: No results found for this basename: AST, ALT, ALKPHOS, BILITOT, PROT, ALBUMIN,  in the last 168 hours No results found for this basename: LIPASE, AMYLASE,  in the last 168 hours No results found for this basename: AMMONIA,  in the last 168 hours CBC:  Recent Labs Lab 07/03/12 2100 07/04/12 0450 07/05/12 0442 07/06/12 0525 07/07/12 0503  WBC 16.8* 20.6* 18.9* 24.4* 22.5*  NEUTROABS 15.2*  --   --   --   --   HGB 10.3* 10.4* 9.0* 7.8* 8.2*  HCT 31.3* 31.6* 26.7* 22.6* 23.6*  MCV 80.9 80.2 78.5 76.9* 76.9*  PLT 178 151 140*  148* 166   Cardiac Enzymes:  Recent Labs Lab 07/04/12 0630 07/06/12 0525  CKTOTAL 1457* 229  CKMB  --  5.8*   BNP (last 3 results) No results found for this basename: PROBNP,  in the last 8760 hours CBG:  Recent Labs Lab 07/06/12 1210 07/06/12 1642 07/06/12 2112 07/07/12 0801 07/07/12 1205  GLUCAP 73 73 72 106* 96    Recent Results (from the past 240 hour(s))  CULTURE, BLOOD (ROUTINE X 2)     Status: None   Collection Time    07/03/12  9:00 PM      Result Value Range Status   Specimen Description BLOOD LEFT ANTECUBITAL   Final   Special Requests BOTTLES DRAWN AEROBIC AND ANAEROBIC 3CC   Final   Culture  Setup Time 07/04/2012 04:42    Final   Culture     Final   Value:        BLOOD CULTURE RECEIVED NO GROWTH TO DATE CULTURE WILL BE HELD FOR 5 DAYS BEFORE ISSUING A FINAL NEGATIVE REPORT   Report Status PENDING   Incomplete  URINE CULTURE     Status: None   Collection Time    07/03/12  9:12 PM      Result Value Range Status   Specimen Description URINE, CATHETERIZED   Final   Special Requests NONE   Final   Culture  Setup Time 07/04/2012 04:07   Final   Colony Count >=100,000 COLONIES/ML   Final   Culture     Final   Value: ESCHERICHIA COLI     Note: Two isolates with different morphologies were identified as the same organism.The most resistant organism was reported.   Report Status 07/06/2012 FINAL   Final   Organism ID, Bacteria ESCHERICHIA COLI   Final  CULTURE, BLOOD (ROUTINE X 2)     Status: None   Collection Time    07/03/12 10:30 PM      Result Value Range Status   Specimen Description BLOOD LEFT HAND   Final   Special Requests BOTTLES DRAWN AEROBIC AND ANAEROBIC 3CC   Final   Culture  Setup Time 07/04/2012 01:32   Final   Culture     Final   Value:        BLOOD CULTURE RECEIVED NO GROWTH TO DATE CULTURE WILL BE HELD FOR 5 DAYS BEFORE ISSUING A FINAL NEGATIVE REPORT   Report Status PENDING   Incomplete  MRSA PCR SCREENING     Status: None   Collection Time    07/04/12  2:44 AM      Result Value Range Status   MRSA by PCR NEGATIVE  NEGATIVE Final   Comment:            The GeneXpert MRSA Assay (FDA     approved for NASAL specimens     only), is one component of a     comprehensive MRSA colonization     surveillance program. It is not     intended to diagnose MRSA     infection nor to guide or     monitor treatment for     MRSA infections.  CLOSTRIDIUM DIFFICILE BY PCR     Status: None   Collection Time    07/06/12 11:22 AM      Result Value Range Status   C difficile by pcr NEGATIVE  NEGATIVE Final     Studies: No results found.  Scheduled Meds: . antiseptic oral rinse  15 mL Mouth Rinse  q12n4p  . aspirin EC  81 mg Oral Daily  . cefTRIAXone (ROCEPHIN)  IV  1 g Intravenous Daily  . chlorhexidine  15 mL Mouth Rinse BID  . clindamycin (CLEOCIN) IV  600 mg Intravenous Q8H  . donepezil  10 mg Oral QHS  . enoxaparin (LOVENOX) injection  30 mg Subcutaneous Q24H  . insulin aspart  0-9 Units Subcutaneous TID WC  . QUEtiapine  25 mg Oral Daily  . sertraline  150 mg Oral Daily  . sodium chloride  1,000 mL Intravenous Once  . sodium chloride  3 mL Intravenous Q12H  . traZODone  25 mg Oral Daily   Continuous Infusions: . dextrose 5 % and 0.9% NaCl 100 mL/hr at 07/07/12 0122    Principal Problem:   Acute renal failure Active Problems:   DM   Alzheimer's disease   Anemia in chronic kidney disease(285.21)   UTI (lower urinary tract infection)   Cellulitis and abscess of leg    Time spent: 25 minutes.     Mikeal Winstanley  [facility] Pager [phone]. If 7PM-7AM, please contact night-coverage at [email or web address], password [facility] 07/07/2012, 12:36 PM  LOS: 4 days

## 2012-07-07 NOTE — Consult Note (Signed)
[facility] Consultation  Referring Provider:     [facility] Primary Care Physician:  Terald Sleeper, MD Primary Gastroenterologist: Melvia Heaps, MD        Reason for Consultation: feeding difficulties.            HPI:   [name] is a 67 y.o. male with multiple medical problems including, but not limited to CAD, diabetes, dementia. 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 Despite attempts at decompression his colonic dilation persisted. Colonoscopy that admission yeilded a splenic flexure polyp, path c/w adenocarcinoma. Patient underwent an exploratory lap by Dr. Dwain Sarna in August 2009. He had extensive adhesions despite no previous surgeries. In colon was dilated and atonic.  Patient got a subtotal colectomy, end ileostomy and creation of a mucous fistula. Colonoscoy via ostomy in April 2011 was negative for polyps or recurrent cancer.   Patient was admitted 5 days ago from SNF for fever, LLE cellulitis, UTI and progressive renal failure.  He had an elevated wbc,  likely related to cellulitis for which he is on clindamyacin. His renal function is improving. He has been overall failing to thrive. Speech therapy evaluated, felt patient had oropharyngeal dysphagia.  MBSS ordered and c/w same. See below.    Clinical Impression:  Severe pharyngeal phase dysphagia;Severe oral phase dysphagia  Clinical impression: Pt exhibits extended oral holding with posterior spill to vallecula/pyriform and airway. No swallow reflex was elicited, and no cough response was observed following aspiration. Voice quality was wet, and pt unable to follow directions to clear throat or cough. Pt would not open mouth sufficiently for oral suctioning to remove barium from oral cavity. Pt is currently inappropriate for any po intake, including medications  Patient unable to provide history. He cannot recall location, doesn't engage in conversation with me  Past Medical History   Diagnosis Date  . Hyponatremia   . Anemia   . Diabetes mellitus without complication   . Hypertension   . UTI (lower urinary tract infection)   . Delusional disorder   . HOH (hard of hearing)   . Hyperlipidemia   . CHF (congestive heart failure)   . Renal insufficiency   . Stroke   . Dementia   . Depression   . Dysphagia   . Falls frequently     Past Surgical History  Procedure Laterality Date  . Colon surgery    . Colectomy    . Ileostomy    . Peg tube removal    . Cardiac catheterization    . Cholecystectomy    . Abdominal adhesion surgery     FMH unable to be obtained from patient History  Substance Use Topics  . Smoking status: Not on file  . Smokeless tobacco: Not on file  . Alcohol Use: No    Prior to Admission medications   Medication Sig Start Date End Date Taking? Authorizing Provider  acetaminophen (TYLENOL) 500 MG tablet Take 500 mg by mouth every 6 (six) hours as needed for pain.   Yes Historical Provider, MD  aspirin EC 81 MG tablet Take 81 mg by mouth daily.   Yes Historical Provider, MD  cefTRIAXone (ROCEPHIN) 1 G injection Inject 1 g into the muscle once.   Yes Historical Provider, MD  cyanocobalamin (,VITAMIN B-12,) 1000 MCG/ML injection Inject 1,000 mcg into the muscle every 30 (thirty) days.   Yes Historical Provider, MD  docusate sodium (COLACE) 100 MG capsule Take 200 mg by mouth daily.    Yes Historical Provider, MD  donepezil (ARICEPT) 10 MG tablet Take 10 mg by mouth at bedtime.   Yes Historical Provider, MD  feeding supplement (PRO-STAT SUGAR FREE 64) LIQD Take 30 mLs by mouth 2 (two) times daily.   Yes Historical Provider, MD  fenofibrate (TRICOR) 48 MG tablet Take 48 mg by mouth daily.   Yes Historical Provider, MD  ferrous sulfate 325 (65 FE) MG tablet Take 325 mg by mouth daily with breakfast.   Yes Historical Provider, MD  furosemide (LASIX) 20 MG tablet Take 20 mg by mouth daily.   Yes Historical Provider, MD  insulin detemir (LEVEMIR)  100 UNIT/ML injection Inject 5 Units into the skin at bedtime.   Yes Historical Provider, MD  metoprolol tartrate (LOPRESSOR) 25 MG tablet Take 12.5 mg by mouth daily.   Yes Historical Provider, MD  Multiple Vitamin (MULTIVITAMIN WITH MINERALS) TABS Take 1 tablet by mouth daily.   Yes Historical Provider, MD  niacin-simvastatin (SIMCOR) 500-20 MG 24 hr tablet Take 1 tablet by mouth at bedtime.   Yes Historical Provider, MD  QUEtiapine (SEROQUEL) 25 MG tablet Take 25 mg by mouth daily.   Yes Historical Provider, MD  sertraline (ZOLOFT) 50 MG tablet Take 150 mg by mouth daily.   Yes Historical Provider, MD  tamsulosin (FLOMAX) 0.4 MG CAPS Take 0.4 mg by mouth daily.   Yes Historical Provider, MD  traZODone (DESYREL) 50 MG tablet Take 25 mg by mouth daily.   Yes Historical Provider, MD  trolamine salicylate (ASPERCREME) 10 % cream Apply 1 application topically 2 (two) times daily. Applied to shoulder.   Yes Historical Provider, MD  vitamin C (ASCORBIC ACID) 500 MG tablet Take 500 mg by mouth 2 (two) times daily.   Yes Historical Provider, MD    Current Facility-Administered Medications  Medication Dose Route Frequency Provider Last Rate Last Dose  . antiseptic oral rinse (BIOTENE) solution 15 mL  15 mL Mouth Rinse q12n4p Belkys A Regalado, MD   15 mL at 07/07/12 1200  . aspirin EC tablet 81 mg  81 mg Oral Daily Tarry Kos, MD   81 mg at 07/06/12 0953  . cefTRIAXone (ROCEPHIN) 1 g in dextrose 5 % 50 mL IVPB  1 g Intravenous Daily Belkys A Regalado, MD   1 g at 07/07/12 1035  . chlorhexidine (PERIDEX) 0.12 % solution 15 mL  15 mL Mouth Rinse BID Belkys A Regalado, MD   15 mL at 07/07/12 1035  . clindamycin (CLEOCIN) IVPB 600 mg  600 mg Intravenous Q8H Lorenza Evangelist, RPH   600 mg at 07/07/12 1433  . dextrose 5 %-0.9 % sodium chloride infusion   Intravenous Continuous Belkys A Regalado, MD 100 mL/hr at 07/07/12 0122    . donepezil (ARICEPT) tablet 10 mg  10 mg Oral QHS Tarry Kos, MD   10 mg at  07/05/12 2237  . [START ON 07/08/2012] enoxaparin (LOVENOX) injection 40 mg  40 mg Subcutaneous Q24H Teressa Lower, RPH      . insulin aspart (novoLOG) injection 0-9 Units  0-9 Units Subcutaneous TID WC Dorothea Ogle, MD   1 Units at 07/05/12 1757  . QUEtiapine (SEROQUEL) tablet 25 mg  25 mg Oral Daily Tarry Kos, MD   25 mg at 07/06/12 0953  . sertraline (ZOLOFT) tablet 150 mg  150 mg Oral Daily Tarry Kos, MD   150 mg at 07/06/12 0952  . sodium chloride 0.9 % bolus 1,000 mL  1,000 mL Intravenous Once [employer], PA-C      .  sodium chloride 0.9 % injection 3 mL  3 mL Intravenous Q12H Tarry Kos, MD   3 mL at 07/04/12 0200  . traZODone (DESYREL) tablet 25 mg  25 mg Oral Daily Tarry Kos, MD   25 mg at 07/06/12 1610    Allergies as of 07/03/2012  . (No Known Allergies)    Review of Systems:    Unable to be obtained secondary to patient's mental status   Physical Exam:  Vital signs in last 24 hours: Temp:  [97.7 F (36.5 C)-98.4 F (36.9 C)] 98 F (36.7 C) (06/27 1434) Pulse Rate:  [64-77] 73 (06/27 1434) Resp:  [20-28] 20 (06/27 1434) BP: (120-143)/(47-68) 143/68 mmHg (06/27 1434) SpO2:  [97 %-100 %] 100 % (06/27 1434) Last BM Date: 07/06/12 General:   Well developed black male in NAD Head:  Normocephalic and atraumatic. Eyes:   No icterus.   Conjunctiva pink. Ears:  Normal auditory acuity. Neck:  Supple; no masses felt Lungs:  Respirations even and unlabored. Gurgling / congestion in throat. Heart:  Regular rate and rhythm Abdomen:  Soft, nondistended, nontender. Normal bowel sounds. Ostomy with dark liquid stool. Mucous fistula without drainage in LLQ.  Rectal:  Not performed.  Msk:  Symmetrical without gross deformities.  Extremities:  Erythema and pitting edema of LLE. Both lower extremities with venous stasis characteristics.  Neurologic:  Alert, confused.  Skin:  Intact without significant lesions or rashes. Cervical Nodes:  No significant cervical  adenopathy. Psych:  Pleasant, smiles  LAB RESULTS:  Recent Labs  07/05/12 0442 07/06/12 0525 07/07/12 0503  WBC 18.9* 24.4* 22.5*  HGB 9.0* 7.8* 8.2*  HCT 26.7* 22.6* 23.6*  PLT 140* 148* 166   BMET  Recent Labs  07/05/12 0442 07/06/12 0525 07/07/12 0503  NA 130* 135 139  K 5.1 3.7 3.7  CL 101 107 111  CO2 19 18* 18*  GLUCOSE 125* 79 104*  BUN 47* 45* 40*  CREATININE 3.92* 3.01* 2.50*  CALCIUM 9.1 8.6 8.6    Impression / Plan:   13. 67 year old black male with multiple medical problems as listed in PMH. Admitted with progressive renal failure (now improving) and failure to thrive.  2. Severe oropharyngeal dysphagia. Due to abdominal surgeries it may be technically difficult for place PEG endoscopically. Patient may be best served by IR placement of PEG tube. Will discuss further with Dr. Marina Goodell.  Thanks   LOS: 4 days   Willette Cluster  07/07/2012, 2:53 PM  GI ATTENDING  History,labs,x-rays reviewed. Patient seen and examined.agree with above H&P.Complicated gentleman with multiple significant medical problems as outlined above. Asked to see for PEG placement. Given complicated abdominal surgical history , would recommend INTERVENTIONAL RADIOLOGY G-TUBE placement (if that is what is desired by primary service and family). Thank you .  Wilhemina Bonito. Eda Keys., M.D. [facility] Division of Gastroenterology

## 2012-07-08 ENCOUNTER — Encounter (HOSPITAL_COMMUNITY): Payer: Self-pay | Admitting: Internal Medicine

## 2012-07-08 ENCOUNTER — Inpatient Hospital Stay (HOSPITAL_COMMUNITY): Payer: Medicare Other

## 2012-07-08 LAB — CBC
HCT: 23.7 % — ABNORMAL LOW (ref 39.0–52.0)
Hemoglobin: 8.1 g/dL — ABNORMAL LOW (ref 13.0–17.0)
RBC: 3.08 MIL/uL — ABNORMAL LOW (ref 4.22–5.81)
RDW: 14.9 % (ref 11.5–15.5)
WBC: 10.6 10*3/uL — ABNORMAL HIGH (ref 4.0–10.5)

## 2012-07-08 LAB — BASIC METABOLIC PANEL
BUN: 36 mg/dL — ABNORMAL HIGH (ref 6–23)
CO2: 20 mEq/L (ref 19–32)
Chloride: 116 mEq/L — ABNORMAL HIGH (ref 96–112)
GFR calc Af Amer: 32 mL/min — ABNORMAL LOW (ref >90)
Glucose, Bld: 121 mg/dL — ABNORMAL HIGH (ref 70–99)
Potassium: 3.7 mEq/L (ref 3.5–5.1)

## 2012-07-08 LAB — GLUCOSE, CAPILLARY
Glucose-Capillary: 104 mg/dL — ABNORMAL HIGH (ref 70–99)
Glucose-Capillary: 109 mg/dL — ABNORMAL HIGH (ref 70–99)
Glucose-Capillary: 99 mg/dL (ref 70–99)

## 2012-07-08 MED ORDER — DEXTROSE-NACL 5-0.45 % IV SOLN
INTRAVENOUS | Status: DC
  Administered 2012-07-08 – 2012-07-11 (×5): via INTRAVENOUS
  Administered 2012-07-12: 1000 mL via INTRAVENOUS
  Administered 2012-07-12 – 2012-07-14 (×4): via INTRAVENOUS

## 2012-07-08 NOTE — Procedures (Signed)
Objective Swallowing Evaluation: Modified Barium Swallowing Study  Patient Details  Name: MIKYLE [name] MRN: [id number] Date of Birth: [birth year]  Today's Date: 07/08/2012 Time: 1610-1650 SLP Time Calculation (min): 40 min  Past Medical History:  Past Medical History  Diagnosis Date  . Hyponatremia   . Hypokalemia   . Anemia   . Diabetes mellitus without complication   . Hypertension   . UTI (lower urinary tract infection)   . Delusional disorder   . HOH (hard of hearing)   . Hyperlipidemia   . CHF (congestive heart failure)   . Renal insufficiency   . Stroke   . Dementia   . Depression   . Dysphagia   . Falls frequently    Past Surgical History:  Past Surgical History  Procedure Laterality Date  . Colon surgery    . Colectomy    . Ileostomy    . Peg tube removal    . Cardiac catheterization    . Cholecystectomy    . Abdominal adhesion surgery     HPI:  67 yo male adm to [facility] from [facility] with cellulitis of left lower extremity.  Pt with leukocytosis, hyperkalemia, dementia, DM.   Pt passed an RN stroke swallow screen although he has h/o severe dysphagia and has undergone multiple MBS studies.  Bedside swallow evaluation and MBS completed 6/26.  MBS recommended NPO secondary to severe oropharyngeal dysphagia and silent aspiration.  Pt for possible PEG placement per primary team. Pt demonstrating improved mental status today and MD requested repeat evaluation to assess appropriateness for po intake.  After seeing pt at bedside, MBS repeated to allow instrumental evaluation.    Dg Chest 2 View  07/07/2012   *RADIOLOGY REPORT*  Clinical Data: Leukocytosis, evaluate for aspiration pneumonia  CHEST - 2 VIEW  Comparison: 07/03/2012; 09/13/2008; chest CT - 06/05/2008  Findings:  Grossly unchanged enlarged cardiac silhouette and mediastinal contours with slight differences attributable to the decreased lung volumes.  Pulmonary vasculature is indistinct with cephalization of  flow.  Worsening bibasilar heterogeneous / consolidative opacities, left greater than right.  There are at least small bilateral effusions.  No definite pneumothorax.  Unchanged bones.  IMPRESSION: Hypoventilated examination with findings most suggestive of pulmonary edema with small bilateral effusions and bibasilar opacities, left greater than right, atelectasis versus infiltrate. A follow-up chest radiograph in 4 to 6 weeks after treatment is recommended to ensure resolution.   Original Report Authenticated By: Tacey Ruiz, MD       Assessment / Plan / Recommendation Clinical Impression  Dysphagia Diagnosis: Moderate oral phase dysphagia;Moderate pharyngeal phase dysphagia  Clinical impression: Pt with improved swallow function since MBS 2 days prior and he did not aspirate with any consistency tested.  Pt does have delayed oral transiting, lingual pumping due to his cognitive deficit and weakness.  Premature spillage of boluses into pharynx noted with delayed reflexive pharyngeal swallow.  Tongue base and pharyngeal stasis noted across consistencies and mixed with secretion without pt awareness, nor attempts to dry swallow to clear.  Stasis was much worse with pudding WITHOUT pt sensation.  Cued dry swallows conducted approx 25% of opportunities and were helpful to decrease stasis but not fully clear.   Mild amount of laryngeal penetration noted - even with pt taking sequential cup sips of thin barium (feeding himself).  Pt's inability to follow commands increases his aspiration risk, but his ability to self feed is helpful.    Pt remains an aspiration risk with po, however  at bedside his swallow is triggered faster than during MBS.  SLP suspects icecream provided better sensory input and pt consumption of barium impacted testing validity.    Options to pursue po diet (full liquid - no grits, pudding) or continue NPO.   Even if pt were to have PEG, he will still likely aspirate secretions and asp  pna is a risk for pt's with PEG. Pt intake likely would be adequate when he is fully alert and feeding himself based on clinical observation today.    For QOL issues *pt enjoyed eating his icecream and consuming juice* pursuing intake with aspiration mitigation may be feasible option.  Defer diet decision to MD due to multiple factors and borderline test result.   Suspect pt with baseline deficits and episodic aspiration that he manages when well.      Treatment Recommendation  Therapy as outlined in treatment plan below    Diet Recommendation Thin liquid;Nectar-thick liquid;NPO (FULL LIQUID (no pudding or grits)  OR  NPO)   Liquid Administration via: Cup Medication Administration: Crushed with puree (crush with icecream, follow with liquid) Supervision: Patient able to self feed;Full supervision/cueing for compensatory strategies Compensations: Slow rate;Small sips/bites;Check for pocketing (try to get pt to dry swallow) Postural Changes and/or Swallow Maneuvers: Seated upright 90 degrees;Upright 30-60 min after meal    Other  Recommendations Oral Care Recommendations: Oral care BID   Follow Up Recommendations  Skilled Nursing facility    Frequency and Duration min 2x/week  2 weeks   Pertinent Vitals/Pain afebrile    SLP Swallow Goals Patient will utilize recommended strategies during swallow to increase swallowing safety with: Maximal cueing Swallow Study Goal #3 - Progress: Not met (pt cognition prevents following directions) Goal #4: Pt's family will determine if decide for pt to have po with known risks with compensations to decrease aspiration risk.   Swallow Study Goal #4 - Progress: Not met (no family present)   General HPI: 67 yo male adm to [facility] from [place] with cellulitis of left lower extremity.  Pt with leukocytosis, hyperkalemia, dementia, DM.   Pt passed an RN stroke swallow screen although he has h/o severe dysphagia and has undergone multiple MBS studies.   Bedside swallow evaluation and MBS completed 6/26.  MBS recommended NPO secondary to severe oropharyngeal dysphagia and silent aspiration.  Pt for possible PEG placement per primary team. Pt demonstrating improved mental status today and MD requested repeat evaluation to assess appropriateness for po intake.  After seeing pt at bedside, MBS repeated to allow instrumental evaluation.  Type of Study: Modified Barium Swallowing Study Reason for Referral: Objectively evaluate swallowing function Previous Swallow Assessment: MBS two days prior  Diet Prior to this Study: NPO Temperature Spikes Noted: No Respiratory Status: Room air History of Recent Intubation: No Behavior/Cognition: Alert;Decreased sustained attention;Hard of hearing (inconsistent ability to follow commands) Oral Cavity - Dentition: Edentulous Oral Motor / Sensory Function: Impaired - see Bedside swallow eval Self-Feeding Abilities: Able to feed self Patient Positioning: Upright in chair Baseline Vocal Quality: Low vocal intensity;Breathy Volitional Cough: Cognitively unable to elicit Volitional Swallow: Able to elicit Anatomy: Within functional limits Pharyngeal Secretions: Standing secretions in (comment) (pharynx mixed with barium)    Reason for Referral Objectively evaluate swallowing function   Oral Phase Oral - Nectar Oral - Nectar Teaspoon: Weak lingual manipulation;Delayed oral transit;Lingual pumping;Holding of bolus Oral - Nectar Cup: Delayed oral transit;Weak lingual manipulation;Lingual pumping;Holding of bolus Oral - Thin Oral - Thin Teaspoon: Lingual pumping;Holding of bolus;Weak  lingual manipulation;Delayed oral transit Oral - Thin Cup: Delayed oral transit;Weak lingual manipulation;Lingual pumping;Holding of bolus Oral - Solids Oral - Puree: Delayed oral transit;Lingual pumping;Weak lingual manipulation   Pharyngeal Phase Pharyngeal - Nectar Pharyngeal - Nectar Teaspoon: Delayed swallow initiation;Premature  spillage to valleculae;Reduced laryngeal elevation;Reduced airway/laryngeal closure;Reduced tongue base retraction;Pharyngeal residue - valleculae;Pharyngeal residue - pyriform sinuses;Lateral channel residue Pharyngeal - Nectar Cup: Premature spillage to valleculae;Delayed swallow initiation;Reduced epiglottic inversion;Reduced anterior laryngeal mobility;Reduced pharyngeal peristalsis;Reduced tongue base retraction;Pharyngeal residue - valleculae;Pharyngeal residue - pyriform sinuses;Penetration/Aspiration during swallow;Lateral channel residue;Reduced laryngeal elevation;Reduced airway/laryngeal closure Penetration/Aspiration details (nectar cup): Material enters airway, remains ABOVE vocal cords and not ejected out Pharyngeal - Thin Pharyngeal - Thin Teaspoon: Premature spillage to valleculae;Delayed swallow initiation;Lateral channel residue;Pharyngeal residue - pyriform sinuses;Pharyngeal residue - valleculae;Reduced anterior laryngeal mobility;Reduced epiglottic inversion;Reduced pharyngeal peristalsis;Reduced laryngeal elevation;Reduced airway/laryngeal closure;Reduced tongue base retraction Pharyngeal - Thin Cup: Delayed swallow initiation;Premature spillage to valleculae;Penetration/Aspiration during swallow Penetration/Aspiration details (thin cup): Material enters airway, remains ABOVE vocal cords and not ejected out Pharyngeal - Solids Pharyngeal - Puree: Delayed swallow initiation;Premature spillage to valleculae;Reduced pharyngeal peristalsis;Reduced epiglottic inversion;Reduced anterior laryngeal mobility;Reduced laryngeal elevation;Reduced airway/laryngeal closure;Reduced tongue base retraction;Pharyngeal residue - valleculae;Pharyngeal residue - pyriform sinuses;Lateral channel residue;Compensatory strategies attempted (Comment) (mixed with secretions) Penetration/Aspiration details (puree): Material does not enter airway  Cervical Esophageal Phase    GO    Cervical Esophageal  Phase Cervical Esophageal Phase: Impaired Cervical Esophageal Phase - Nectar Nectar Teaspoon: Reduced cricopharyngeal relaxation Cervical Esophageal Phase - Thin Thin Teaspoon: Reduced cricopharyngeal relaxation Thin Cup: Reduced cricopharyngeal relaxation Cervical Esophageal Phase - Solids Puree: Reduced cricopharyngeal relaxation Cervical Esophageal Phase - Comment Cervical Esophageal Comment: appearance of minimal amount of stasis at distal region with esophageal sweep x1, decreased ues opening results in increase pyriform sinus stasis         Donavan Burnet, MS [facility] SLP [phone]

## 2012-07-08 NOTE — Progress Notes (Signed)
[facility] PROGRESS NOTE  [name] UJW:[id number] DOB: 1945-06-09 DOA: 07/03/2012 PCP: Terald Sleeper, MD  Assessment/Plan:  1-Cellulitis of LLE, ulcer big toe.  -continue IV clindamycin day 5. -dopplers negative for  DVT   2. Leukocytosis  -trending down today at 10.  -Others possibility aspiration PNA. Clindamycin should cover for aspiration PNA. Chest x ray 6-28  with infiltrates, pulmonary edema. -Urine grew E coli. Continue with ceftriaxone day 3.   3. ARF on CKD: baseline creatinine 1.4  -Component of rhabdomyolysis. Total Ck decrease to 229.  -Continue with IV fluids. hold lasix  -renal US: No acute urinary pathology. Mild cortical thinning bilaterally. -good urine out put. Cr trending down. Today at 2.3. -Decrease IV fluids due to mild pulmonary edema.  -Change fluids to Half NS, D 5.   4. Hyperkalemia  -Resolved.  -Received kayexalate and insulin.   5. Dementia: moderately advanced  -Patient was able to engage in a conversation today.  -continue aricept, seroquel   6. DM  -continue levemir, SSI  7. DVt proph: lovenox 8-UTI, Urine grew 100-K E coli sensitive to  ceftriaxone. Continue with ceftriaxone day 3. 9-Anemia: monitor for bleed.  10-Dysphagia: Patient with severe oral  pharyngeal dysphagia. Patient today MS improved, able to carrie a conversation. Will ask Speech to re evaluate before proceeding with Feeding tube.   Code Status: Full Code.  Family Communication: sister.  Disposition Plan: Back to SNF when stable.    Consultants:  Wound care nurse.   Procedures:  Doppler LE negative for DVT.   Antibiotics:  Clindamycin 6-24   Ceftriaxone 6-26  HPI/Subjective: Patient today relates some mild pain leg. He was able to tell me that the problem is his swallow. He was more engage in the converrsation today and following command.   Objective: Filed Vitals:   07/07/12 0529 07/07/12 1434 07/07/12 2106 07/08/12 0621  BP: 120/53  143/68 147/61 139/59  Pulse: 64 73 69 63  Temp: 98.4 F (36.9 C) 98 F (36.7 C) 98.2 F (36.8 C) 97.9 F (36.6 C)  TempSrc: Oral Oral Oral Oral  Resp: 20 20 22 20   Height:      Weight:      SpO2: 100% 100% 100% 100%    Intake/Output Summary (Last 24 hours) at 07/08/12 1328 Last data filed at 07/08/12 1108  Gross per 24 hour  Intake   2150 ml  Output   1625 ml  Net    525 ml   Filed Weights   07/04/12 0200  Weight: 100.8 kg (222 lb 3.6 oz)    Exam:   General:  No distress.   Cardiovascular:S 1, S 2 RRR  Respiratory: Bilateral ronchus.   Abdomen: Bs present, soft, NT  Musculoskeletal: left LE with erythema, edema. Left big toe with ulcer   Data Reviewed: Basic Metabolic Panel:  Recent Labs Lab 07/04/12 1500 07/05/12 0442 07/06/12 0525 07/07/12 0503 07/08/12 0425  NA 130* 130* 135 139 146*  K 5.3* 5.1 3.7 3.7 3.7  CL 97 101 107 111 116*  CO2 18* 19 18* 18* 20  GLUCOSE 164* 125* 79 104* 121*  BUN 39* 47* 45* 40* 36*  CREATININE 4.06* 3.92* 3.01* 2.50* 2.32*  CALCIUM 9.4 9.1 8.6 8.6 8.6   Liver Function Tests: No results found for this basename: AST, ALT, ALKPHOS, BILITOT, PROT, ALBUMIN,  in the last 168 hours No results found for this basename: LIPASE, AMYLASE,  in the last 168 hours No results found for this basename:  AMMONIA,  in the last 168 hours CBC:  Recent Labs Lab 07/03/12 2100 07/04/12 0450 07/05/12 0442 07/06/12 0525 07/07/12 0503 07/08/12 0425  WBC 16.8* 20.6* 18.9* 24.4* 22.5* 10.6*  NEUTROABS 15.2*  --   --   --   --   --   HGB 10.3* 10.4* 9.0* 7.8* 8.2* 8.1*  HCT 31.3* 31.6* 26.7* 22.6* 23.6* 23.7*  MCV 80.9 80.2 78.5 76.9* 76.9* 76.9*  PLT 178 151 140* 148* 166 169   Cardiac Enzymes:  Recent Labs Lab 07/04/12 0630 07/06/12 0525  CKTOTAL 1457* 229  CKMB  --  5.8*   BNP (last 3 results) No results found for this basename: PROBNP,  in the last 8760 hours CBG:  Recent Labs Lab 07/07/12 2009 07/08/12 0012  07/08/12 0450 07/08/12 0800 07/08/12 1148  GLUCAP 91 104* 99 109* 102*    Recent Results (from the past 240 hour(s))  CULTURE, BLOOD (ROUTINE X 2)     Status: None   Collection Time    07/03/12  9:00 PM      Result Value Range Status   Specimen Description BLOOD LEFT ANTECUBITAL   Final   Special Requests BOTTLES DRAWN AEROBIC AND ANAEROBIC 3CC   Final   Culture  Setup Time 07/04/2012 04:42   Final   Culture     Final   Value:        BLOOD CULTURE RECEIVED NO GROWTH TO DATE CULTURE WILL BE HELD FOR 5 DAYS BEFORE ISSUING A FINAL NEGATIVE REPORT   Report Status PENDING   Incomplete  URINE CULTURE     Status: None   Collection Time    07/03/12  9:12 PM      Result Value Range Status   Specimen Description URINE, CATHETERIZED   Final   Special Requests NONE   Final   Culture  Setup Time 07/04/2012 04:07   Final   Colony Count >=100,000 COLONIES/ML   Final   Culture     Final   Value: ESCHERICHIA COLI     Note: Two isolates with different morphologies were identified as the same organism.The most resistant organism was reported.   Report Status 07/06/2012 FINAL   Final   Organism ID, Bacteria ESCHERICHIA COLI   Final  CULTURE, BLOOD (ROUTINE X 2)     Status: None   Collection Time    07/03/12 10:30 PM      Result Value Range Status   Specimen Description BLOOD LEFT HAND   Final   Special Requests BOTTLES DRAWN AEROBIC AND ANAEROBIC 3CC   Final   Culture  Setup Time 07/04/2012 01:32   Final   Culture     Final   Value:        BLOOD CULTURE RECEIVED NO GROWTH TO DATE CULTURE WILL BE HELD FOR 5 DAYS BEFORE ISSUING A FINAL NEGATIVE REPORT   Report Status PENDING   Incomplete  MRSA PCR SCREENING     Status: None   Collection Time    07/04/12  2:44 AM      Result Value Range Status   MRSA by PCR NEGATIVE  NEGATIVE Final   Comment:            The GeneXpert MRSA Assay (FDA     approved for NASAL specimens     only), is one component of a     comprehensive MRSA colonization      surveillance program. It is not     intended to diagnose MRSA  infection nor to guide or     monitor treatment for     MRSA infections.  CLOSTRIDIUM DIFFICILE BY PCR     Status: None   Collection Time    07/06/12 11:22 AM      Result Value Range Status   C difficile by pcr NEGATIVE  NEGATIVE Final     Studies: Dg Chest 2 View  07/07/2012   *RADIOLOGY REPORT*  Clinical Data: Leukocytosis, evaluate for aspiration pneumonia  CHEST - 2 VIEW  Comparison: 07/03/2012; 09/13/2008; chest CT - 06/05/2008  Findings:  Grossly unchanged enlarged cardiac silhouette and mediastinal contours with slight differences attributable to the decreased lung volumes.  Pulmonary vasculature is indistinct with cephalization of flow.  Worsening bibasilar heterogeneous / consolidative opacities, left greater than right.  There are at least small bilateral effusions.  No definite pneumothorax.  Unchanged bones.  IMPRESSION: Hypoventilated examination with findings most suggestive of pulmonary edema with small bilateral effusions and bibasilar opacities, left greater than right, atelectasis versus infiltrate. A follow-up chest radiograph in 4 to 6 weeks after treatment is recommended to ensure resolution.   Original Report Authenticated By: Tacey Ruiz, MD    Scheduled Meds: . antiseptic oral rinse  15 mL Mouth Rinse q12n4p  . aspirin EC  81 mg Oral Daily  . cefTRIAXone (ROCEPHIN)  IV  1 g Intravenous Daily  . chlorhexidine  15 mL Mouth Rinse BID  . clindamycin (CLEOCIN) IV  600 mg Intravenous Q8H  . donepezil  10 mg Oral QHS  . enoxaparin (LOVENOX) injection  40 mg Subcutaneous Q24H  . insulin aspart  0-9 Units Subcutaneous TID WC  . QUEtiapine  25 mg Oral Daily  . sertraline  150 mg Oral Daily  . sodium chloride  1,000 mL Intravenous Once  . sodium chloride  3 mL Intravenous Q12H  . traZODone  25 mg Oral Daily   Continuous Infusions: . dextrose 5 % and 0.9% NaCl 75 mL/hr at 07/08/12 1106    Principal  Problem:   Acute renal failure Active Problems:   DM   Alzheimer's disease   Anemia in chronic kidney disease(285.21)   UTI (lower urinary tract infection)   Cellulitis and abscess of leg    Time spent: 25 minutes.     Lilleigh Hechavarria  [facility] Pager [phone]. If 7PM-7AM, please contact night-coverage at [email or web address], password [facility] 07/08/2012, 1:28 PM  LOS: 5 days

## 2012-07-08 NOTE — Progress Notes (Signed)
SLP spoke to MD who desires MBS today, will conduct as ordered.  MD gave SLP approval to write diet order if indicated after MBS.  Thanks.    Derrick Burnet, MS [facility] SLP [phone]

## 2012-07-08 NOTE — Progress Notes (Addendum)
Speech Language Pathology Dysphagia Treatment Patient Details Name: MONTANA FASSNACHT MRN: [id number] DOB: [birth year] Today's Date: 07/08/2012 Time: 4098-1191 SLP Time Calculation (min): 20 min  Assessment / Plan / Recommendation Clinical Impression  Pt demonstrates significant mental status improvement without congestion apparent at larynx.  Voice is soft and pt does not follow commands to cough.  He was able to self feed today, presented with mild swallow initation delay and multiple swallows across consistencies.  Pt appears to get satisfaction from intake and frequently smiled during session.  No s/s of aspiration with 4 ounces of icecream, 4 ounces of nectar juice.      Options include to pursue repeat instrumental evaluation today given sensorimotor deficits and SILENT asp of applesauce on MBS or initiate a modified diet with strict aspiration precautions = monitoring pt closely and pursue instrumental evaluation on Sunday if pt develops difficulties.     SLP will follow up on Monday June 30th.        Diet Recommendation  Initiate / Change Diet: Nectar-thick liquid;Dysphagia 1 (puree) (or MBS)    SLP Plan   MBS vs PO intake  Pertinent Vitals/Pain Afebrile, decreased   Swallowing Goals  SLP Swallowing Goals Patient will utilize recommended strategies during swallow to increase swallowing safety with: Maximum assistance Swallow Study Goal #3 - Progress: Not met (pt cognition prevents following directions) Goal #4: Pt's family will determine if decide for pt to have po with known risks with compensations to decrease aspiration risk.    General Temperature Spikes Noted: No Respiratory Status: Room air Behavior/Cognition: Alert;Doesn't follow directions;Decreased sustained attention;Hard of hearing;Requires cueing Oral Cavity - Dentition: Edentulous Patient Positioning: Upright in chair  Oral Cavity - Oral Hygiene   NPO, oral cavity is clear  Dysphagia Treatment Treatment focused  on: Patient/family/caregiver education;Facilitation of oral preparatory phase Treatment Methods/Modalities: Skilled observation;Differential diagnosis Patient observed directly with PO's: Yes Type of PO's observed: Nectar-thick liquids;Dysphagia 1 (puree) Feeding: Able to feed self Liquids provided via: Cup;Teaspoon Pharyngeal Phase Signs & Symptoms: Suspected delayed swallow initiation;Multiple swallows Type of cueing: Verbal Amount of cueing: Moderate   GO    Donavan Burnet, MS [facility] SLP [phone]   If MD desires po and pt develops respiratory problems as a result during day Sunday MBS may be completed.  Please call [phone] for weekend SLP coverage.  SLP will follow up on Monday 07-10-12 regardless.

## 2012-07-09 LAB — CBC
Hemoglobin: 8.5 g/dL — ABNORMAL LOW (ref 13.0–17.0)
MCH: 25.8 pg — ABNORMAL LOW (ref 26.0–34.0)
MCV: 77.2 fL — ABNORMAL LOW (ref 78.0–100.0)
RBC: 3.29 MIL/uL — ABNORMAL LOW (ref 4.22–5.81)

## 2012-07-09 LAB — BASIC METABOLIC PANEL
CO2: 19 mEq/L (ref 19–32)
Calcium: 9 mg/dL (ref 8.4–10.5)
Glucose, Bld: 126 mg/dL — ABNORMAL HIGH (ref 70–99)
Potassium: 3.2 mEq/L — ABNORMAL LOW (ref 3.5–5.1)
Sodium: 144 mEq/L (ref 135–145)

## 2012-07-09 LAB — GLUCOSE, CAPILLARY
Glucose-Capillary: 123 mg/dL — ABNORMAL HIGH (ref 70–99)
Glucose-Capillary: 90 mg/dL (ref 70–99)

## 2012-07-09 MED ORDER — CLINDAMYCIN HCL 300 MG PO CAPS
300.0000 mg | ORAL_CAPSULE | Freq: Three times a day (TID) | ORAL | Status: DC
  Administered 2012-07-09 – 2012-07-14 (×13): 300 mg via ORAL
  Filled 2012-07-09 (×19): qty 1

## 2012-07-09 MED ORDER — POTASSIUM CHLORIDE CRYS ER 20 MEQ PO TBCR
40.0000 meq | EXTENDED_RELEASE_TABLET | Freq: Once | ORAL | Status: AC
  Administered 2012-07-09: 40 meq via ORAL
  Filled 2012-07-09: qty 2

## 2012-07-09 NOTE — Progress Notes (Signed)
ANTIBIOTIC CONSULT NOTE - FOLLOW UP  Pharmacy Consult for Clindamycin Indication: Cellulitis of LLE  No Known Allergies  Patient Measurements: Height: 5\' 10"  (177.8 cm) Weight: 222 lb 3.6 oz (100.8 kg) IBW/kg (Calculated) : 73  Vital Signs: Temp: 97.5 F (36.4 C) (06/29 0610) Temp src: Oral (06/29 0610) BP: 140/58 mmHg (06/29 0610) Pulse Rate: 65 (06/29 0610) Intake/Output from previous day: 06/28 0701 - 06/29 0700 In: 1900 [I.V.:1800; IV Piggyback:100] Out: 2150 [Urine:1325; Stool:825]  Labs:  Recent Labs  07/07/12 0503 07/08/12 0425 07/09/12 1026  WBC 22.5* 10.6* 9.8  HGB 8.2* 8.1* 8.5*  PLT 166 169 180  CREATININE 2.50* 2.32* 2.02*   Estimated Creatinine Clearance: 42.2 ml/min (by C-G formula based on Cr of 2.02).   Microbiology: Recent Results (from the past 720 hour(s))  CULTURE, BLOOD (ROUTINE X 2)     Status: None   Collection Time    07/03/12  9:00 PM      Result Value Range Status   Specimen Description BLOOD LEFT ANTECUBITAL   Final   Special Requests BOTTLES DRAWN AEROBIC AND ANAEROBIC 3CC   Final   Culture  Setup Time 07/04/2012 04:42   Final   Culture     Final   Value:        BLOOD CULTURE RECEIVED NO GROWTH TO DATE CULTURE WILL BE HELD FOR 5 DAYS BEFORE ISSUING A FINAL NEGATIVE REPORT   Report Status PENDING   Incomplete  URINE CULTURE     Status: None   Collection Time    07/03/12  9:12 PM      Result Value Range Status   Specimen Description URINE, CATHETERIZED   Final   Special Requests NONE   Final   Culture  Setup Time 07/04/2012 04:07   Final   Colony Count >=100,000 COLONIES/ML   Final   Culture     Final   Value: ESCHERICHIA COLI     Note: Two isolates with different morphologies were identified as the same organism.The most resistant organism was reported.   Report Status 07/06/2012 FINAL   Final   Organism ID, Bacteria ESCHERICHIA COLI   Final  CULTURE, BLOOD (ROUTINE X 2)     Status: None   Collection Time    07/03/12 10:30 PM       Result Value Range Status   Specimen Description BLOOD LEFT HAND   Final   Special Requests BOTTLES DRAWN AEROBIC AND ANAEROBIC 3CC   Final   Culture  Setup Time 07/04/2012 01:32   Final   Culture     Final   Value:        BLOOD CULTURE RECEIVED NO GROWTH TO DATE CULTURE WILL BE HELD FOR 5 DAYS BEFORE ISSUING A FINAL NEGATIVE REPORT   Report Status PENDING   Incomplete  MRSA PCR SCREENING     Status: None   Collection Time    07/04/12  2:44 AM      Result Value Range Status   MRSA by PCR NEGATIVE  NEGATIVE Final   Comment:            The GeneXpert MRSA Assay (FDA     approved for NASAL specimens     only), is one component of a     comprehensive MRSA colonization     surveillance program. It is not     intended to diagnose MRSA     infection nor to guide or     monitor treatment for  MRSA infections.  CLOSTRIDIUM DIFFICILE BY PCR     Status: None   Collection Time    07/06/12 11:22 AM      Result Value Range Status   C difficile by pcr NEGATIVE  NEGATIVE Final    Anti-infectives   Start     Dose/Rate Route Frequency Ordered Stop   07/09/12 1400  clindamycin (CLEOCIN) capsule 300 mg     300 mg Oral 3 times per day 07/09/12 1202     07/06/12 0900  cefTRIAXone (ROCEPHIN) 1 g in dextrose 5 % 50 mL IVPB     1 g 100 mL/hr over 30 Minutes Intravenous Daily 07/06/12 0827     07/04/12 1000  clindamycin (CLEOCIN) IVPB 600 mg  Status:  Discontinued     600 mg 100 mL/hr over 30 Minutes Intravenous Every 12 hours 07/04/12 0151 07/04/12 0237   07/04/12 0600  clindamycin (CLEOCIN) IVPB 600 mg  Status:  Discontinued     600 mg 100 mL/hr over 30 Minutes Intravenous 3 times per day 07/04/12 0237 07/09/12 1202   07/03/12 2115  clindamycin (CLEOCIN) IVPB 600 mg     600 mg 100 mL/hr over 30 Minutes Intravenous  Once 07/03/12 2100 07/03/12 2210      Assessment: 67 yoM presents from NH with fever found to have cellulitis of LLE and E.coli UTI.  Day #7 Clindamycin for cellulitis.   Pharmacy was initially dosing IV, changed to PO per MD on 6/29.    Day #4 Ceftriaxone 1g IV q24h for UTI per MD.  Acute on chronic renal failure, slowly improving.  CrCl~43 ml/min.  Afebrile, WBC improved to WNL.  Noted pt with severe dysphagia, may need to f/u pt is tolerating oral antibiotics.  Goal of Therapy:  Eradication of infection  Plan:  Pharmacy to s/o dosing. Continue antibiotic orders per MD.   Lynann Beaver PharmD, BCPS Pager [phone] 07/09/2012 12:22 PM

## 2012-07-09 NOTE — Progress Notes (Signed)
[facility] PROGRESS NOTE  [name] ZOX:[id number] DOB: August 09, 1945 DOA: 07/03/2012 PCP: Terald Sleeper, MD  Assessment/Plan:  1-Cellulitis of LLE, ulcer big toe.  -continue  clindamycin day 6. Change to oral.  -dopplers negative for  DVT   2. Leukocytosis  -trending down today at 9.  -Others possibility aspiration PNA. Clindamycin should cover for aspiration PNA. Chest x ray 6-28  with infiltrates, pulmonary edema. -Urine grew E coli. Continue with ceftriaxone day 4.   3. ARF on CKD: baseline creatinine 1.4  -Component of rhabdomyolysis. Total Ck decrease to 229.  -Continue with IV fluids. hold lasix  -renal US: No acute urinary pathology. Mild cortical thinning bilaterally. -good urine out put. Cr trending down. Today at 2.0. -Decrease IV fluids due to mild pulmonary edema.  -Continue with IV fluids.   4. Hyperkalemia  -Resolved.  -Received kayexalate and insulin.   5. Dementia: moderately advanced  -Patient was able to engage in a conversation today.  -continue aricept, seroquel   6. DM  -continue levemir, SSI  7. DVt proph: lovenox 8-UTI, Urine grew 100-K E coli sensitive to  ceftriaxone. Continue with ceftriaxone day 4. 9-Anemia: monitor for bleed.  10-Dysphagia: Patient with severe oral  pharyngeal dysphagia. Patient tolerating clear. Will repeat swallow on Monday. Will discussed option with sister.   Code Status: Full Code.  Family Communication: sister.  Disposition Plan: Back to SNF when stable.    Consultants:  Wound care nurse.   Procedures:  Doppler LE negative for DVT.   Antibiotics:  Clindamycin 6-24   Ceftriaxone 6-26  HPI/Subjective: Feeling ok, no complaints. Breathing well.   Objective: Filed Vitals:   07/08/12 0621 07/08/12 1345 07/08/12 2110 07/09/12 0610  BP: 139/59 149/69 139/61 140/58  Pulse: 63 67 71 65  Temp: 97.9 F (36.6 C) 98.6 F (37 C) 99 F (37.2 C) 97.5 F (36.4 C)  TempSrc: Oral Oral Oral Oral   Resp: 20 16 18 18   Height:      Weight:      SpO2: 100% 100% 94% 100%    Intake/Output Summary (Last 24 hours) at 07/09/12 1201 Last data filed at 07/09/12 0821  Gross per 24 hour  Intake   1850 ml  Output   1900 ml  Net    -50 ml   Filed Weights   07/04/12 0200  Weight: 100.8 kg (222 lb 3.6 oz)    Exam:   General:  No distress.   Cardiovascular:S 1, S 2 RRR  Respiratory: Bilateral ronchus.   Abdomen: Bs present, soft, NT  Musculoskeletal: left LE with erythema, edema. Left big toe with ulcer   Data Reviewed: Basic Metabolic Panel:  Recent Labs Lab 07/05/12 0442 07/06/12 0525 07/07/12 0503 07/08/12 0425 07/09/12 1026  NA 130* 135 139 146* 144  K 5.1 3.7 3.7 3.7 3.2*  CL 101 107 111 116* 112  CO2 19 18* 18* 20 19  GLUCOSE 125* 79 104* 121* 126*  BUN 47* 45* 40* 36* 33*  CREATININE 3.92* 3.01* 2.50* 2.32* 2.02*  CALCIUM 9.1 8.6 8.6 8.6 9.0   Liver Function Tests: No results found for this basename: AST, ALT, ALKPHOS, BILITOT, PROT, ALBUMIN,  in the last 168 hours No results found for this basename: LIPASE, AMYLASE,  in the last 168 hours No results found for this basename: AMMONIA,  in the last 168 hours CBC:  Recent Labs Lab 07/03/12 2100  07/05/12 0442 07/06/12 0525 07/07/12 0503 07/08/12 0425 07/09/12 1026  WBC 16.8*  < >  18.9* 24.4* 22.5* 10.6* 9.8  NEUTROABS 15.2*  --   --   --   --   --   --   HGB 10.3*  < > 9.0* 7.8* 8.2* 8.1* 8.5*  HCT 31.3*  < > 26.7* 22.6* 23.6* 23.7* 25.4*  MCV 80.9  < > 78.5 76.9* 76.9* 76.9* 77.2*  PLT 178  < > 140* 148* 166 169 180  < > = values in this interval not displayed. Cardiac Enzymes:  Recent Labs Lab 07/04/12 0630 07/06/12 0525  CKTOTAL 1457* 229  CKMB  --  5.8*   BNP (last 3 results) No results found for this basename: PROBNP,  in the last 8760 hours CBG:  Recent Labs Lab 07/08/12 1606 07/08/12 1939 07/08/12 2337 07/09/12 0341 07/09/12 0800  GLUCAP 101* 107* 108* 123* 113*    Recent  Results (from the past 240 hour(s))  CULTURE, BLOOD (ROUTINE X 2)     Status: None   Collection Time    07/03/12  9:00 PM      Result Value Range Status   Specimen Description BLOOD LEFT ANTECUBITAL   Final   Special Requests BOTTLES DRAWN AEROBIC AND ANAEROBIC 3CC   Final   Culture  Setup Time 07/04/2012 04:42   Final   Culture     Final   Value:        BLOOD CULTURE RECEIVED NO GROWTH TO DATE CULTURE WILL BE HELD FOR 5 DAYS BEFORE ISSUING A FINAL NEGATIVE REPORT   Report Status PENDING   Incomplete  URINE CULTURE     Status: None   Collection Time    07/03/12  9:12 PM      Result Value Range Status   Specimen Description URINE, CATHETERIZED   Final   Special Requests NONE   Final   Culture  Setup Time 07/04/2012 04:07   Final   Colony Count >=100,000 COLONIES/ML   Final   Culture     Final   Value: ESCHERICHIA COLI     Note: Two isolates with different morphologies were identified as the same organism.The most resistant organism was reported.   Report Status 07/06/2012 FINAL   Final   Organism ID, Bacteria ESCHERICHIA COLI   Final  CULTURE, BLOOD (ROUTINE X 2)     Status: None   Collection Time    07/03/12 10:30 PM      Result Value Range Status   Specimen Description BLOOD LEFT HAND   Final   Special Requests BOTTLES DRAWN AEROBIC AND ANAEROBIC 3CC   Final   Culture  Setup Time 07/04/2012 01:32   Final   Culture     Final   Value:        BLOOD CULTURE RECEIVED NO GROWTH TO DATE CULTURE WILL BE HELD FOR 5 DAYS BEFORE ISSUING A FINAL NEGATIVE REPORT   Report Status PENDING   Incomplete  MRSA PCR SCREENING     Status: None   Collection Time    07/04/12  2:44 AM      Result Value Range Status   MRSA by PCR NEGATIVE  NEGATIVE Final   Comment:            The GeneXpert MRSA Assay (FDA     approved for NASAL specimens     only), is one component of a     comprehensive MRSA colonization     surveillance program. It is not     intended to diagnose MRSA     infection nor  to  guide or     monitor treatment for     MRSA infections.  CLOSTRIDIUM DIFFICILE BY PCR     Status: None   Collection Time    07/06/12 11:22 AM      Result Value Range Status   C difficile by pcr NEGATIVE  NEGATIVE Final     Studies: Dg Chest 2 View  07/07/2012   *RADIOLOGY REPORT*  Clinical Data: Leukocytosis, evaluate for aspiration pneumonia  CHEST - 2 VIEW  Comparison: 07/03/2012; 09/13/2008; chest CT - 06/05/2008  Findings:  Grossly unchanged enlarged cardiac silhouette and mediastinal contours with slight differences attributable to the decreased lung volumes.  Pulmonary vasculature is indistinct with cephalization of flow.  Worsening bibasilar heterogeneous / consolidative opacities, left greater than right.  There are at least small bilateral effusions.  No definite pneumothorax.  Unchanged bones.  IMPRESSION: Hypoventilated examination with findings most suggestive of pulmonary edema with small bilateral effusions and bibasilar opacities, left greater than right, atelectasis versus infiltrate. A follow-up chest radiograph in 4 to 6 weeks after treatment is recommended to ensure resolution.   Original Report Authenticated By: Tacey Ruiz, MD   Dg Swallowing Func-speech Pathology  07/08/2012   Chales Abrahams, CCC-SLP     07/08/2012  5:31 PM Objective Swallowing Evaluation: Modified Barium Swallowing Study   Patient Details  Name: JAHAAN VANWAGNER MRN: [id number] Date of Birth: 10-09-1945  Today's Date: 07/08/2012 Time: 1610-1650 SLP Time Calculation (min): 40 min  Past Medical History:  Past Medical History  Diagnosis Date  . Hyponatremia   . Hypokalemia   . Anemia   . Diabetes mellitus without complication   . Hypertension   . UTI (lower urinary tract infection)   . Delusional disorder   . HOH (hard of hearing)   . Hyperlipidemia   . CHF (congestive heart failure)   . Renal insufficiency   . Stroke   . Dementia   . Depression   . Dysphagia   . Falls frequently    Past Surgical History:  Past  Surgical History  Procedure Laterality Date  . Colon surgery    . Colectomy    . Ileostomy    . Peg tube removal    . Cardiac catheterization    . Cholecystectomy    . Abdominal adhesion surgery     HPI:  67 yo male adm to [facility] from [facility] with cellulitis of left  lower extremity.  Pt with leukocytosis, hyperkalemia, dementia,  DM.   Pt passed an RN stroke swallow screen although he has h/o  severe dysphagia and has undergone multiple MBS studies.  Bedside  swallow evaluation and MBS completed 6/26.  MBS recommended NPO  secondary to severe oropharyngeal dysphagia and silent  aspiration.  Pt for possible PEG placement per primary team. Pt  demonstrating improved mental status today and MD requested  repeat evaluation to assess appropriateness for po intake.  After  seeing pt at bedside, MBS repeated to allow instrumental  evaluation.    Dg Chest 2 View  07/07/2012   *RADIOLOGY REPORT*  Clinical Data: Leukocytosis,  evaluate for aspiration pneumonia  CHEST - 2 VIEW  Comparison:  07/03/2012; 09/13/2008; chest CT - 06/05/2008  Findings:  Grossly  unchanged enlarged cardiac silhouette and mediastinal contours  with slight differences attributable to the decreased lung  volumes.  Pulmonary vasculature is indistinct with cephalization  of flow.  Worsening bibasilar heterogeneous / consolidative  opacities, left greater than right.  There are  at least small  bilateral effusions.  No definite pneumothorax.  Unchanged bones.   IMPRESSION: Hypoventilated examination with findings most  suggestive of pulmonary edema with small bilateral effusions and  bibasilar opacities, left greater than right, atelectasis versus  infiltrate. A follow-up chest radiograph in 4 to 6 weeks after  treatment is recommended to ensure resolution.   Original Report  Authenticated By: Tacey Ruiz, MD       Assessment / Plan / Recommendation Clinical Impression  Dysphagia Diagnosis: Moderate oral phase dysphagia;Moderate  pharyngeal phase  dysphagia  Clinical impression: Pt with improved swallow function since MBS  2 days prior and he did not aspirate with any consistency tested.   Pt does have delayed oral transiting, lingual pumping due to his  cognitive deficit and weakness.  Premature spillage of boluses  into pharynx noted with delayed reflexive pharyngeal swallow.   Tongue base and pharyngeal stasis noted across consistencies and  mixed with secretion without pt awareness, nor attempts to dry  swallow to clear.  Stasis was much worse with pudding WITHOUT pt  sensation.  Cued dry swallows conducted approx 25% of  opportunities and were helpful to decrease stasis but not fully  clear.   Mild amount of laryngeal penetration noted - even with  pt taking sequential cup sips of thin barium (feeding himself).   Pt's inability to follow commands increases his aspiration risk,  but his ability to self feed is helpful.    Pt remains an aspiration risk with po, however at bedside his  swallow is triggered faster than during MBS.  SLP suspects  icecream provided better sensory input and pt consumption of  barium impacted testing validity.    Options to pursue po diet (full liquid - no grits, pudding) or  continue NPO.   Even if pt were to have PEG, he will still likely  aspirate secretions and asp pna is a risk for pt's with PEG. Pt  intake likely would be adequate when he is fully alert and  feeding himself based on clinical observation today.    For QOL issues *pt enjoyed eating his icecream and consuming  juice* pursuing intake with aspiration mitigation may be feasible  option.  Defer diet decision to MD due to multiple factors and  borderline test result.   Suspect pt with baseline deficits and episodic aspiration that he  manages when well.      Treatment Recommendation  Therapy as outlined in treatment plan below    Diet Recommendation Thin liquid;Nectar-thick liquid;NPO (FULL  LIQUID (no pudding or grits)  OR  NPO)   Liquid Administration via: Cup  Medication Administration: Crushed with puree (crush with  icecream, follow with liquid) Supervision: Patient able to self feed;Full supervision/cueing  for compensatory strategies Compensations: Slow rate;Small sips/bites;Check for pocketing  (try to get pt to dry swallow) Postural Changes and/or Swallow Maneuvers: Seated upright 90  degrees;Upright 30-60 min after meal    Other  Recommendations Oral Care Recommendations: Oral care BID   Follow Up Recommendations  Skilled Nursing facility    Frequency and Duration min 2x/week  2 weeks   Pertinent Vitals/Pain afebrile    SLP Swallow Goals Patient will utilize recommended strategies during swallow to  increase swallowing safety with: Maximal cueing Swallow Study Goal #3 - Progress: Not met (pt cognition prevents  following directions) Goal #4: Pt's family will determine if decide for pt to have po  with known risks with compensations to decrease aspiration risk.  Swallow Study Goal #4 - Progress: Not met (no family present)   General HPI: 67 yo male adm to [facility] from [place] with  cellulitis of left lower extremity.  Pt with leukocytosis,  hyperkalemia, dementia, DM.   Pt passed an RN stroke swallow  screen although he has h/o severe dysphagia and has undergone  multiple MBS studies.  Bedside swallow evaluation and MBS  completed 6/26.  MBS recommended NPO secondary to severe  oropharyngeal dysphagia and silent aspiration.  Pt for possible  PEG placement per primary team. Pt demonstrating improved mental  status today and MD requested repeat evaluation to assess  appropriateness for po intake.  After seeing pt at bedside, MBS  repeated to allow instrumental evaluation.  Type of Study: Modified Barium Swallowing Study Reason for Referral: Objectively evaluate swallowing function Previous Swallow Assessment: MBS two days prior  Diet Prior to this Study: NPO Temperature Spikes Noted: No Respiratory Status: Room air History of Recent Intubation: No  Behavior/Cognition: Alert;Decreased sustained attention;Hard of  hearing (inconsistent ability to follow commands) Oral Cavity - Dentition: Edentulous Oral Motor / Sensory Function: Impaired - see Bedside swallow  eval Self-Feeding Abilities: Able to feed self Patient Positioning: Upright in chair Baseline Vocal Quality: Low vocal intensity;Breathy Volitional Cough: Cognitively unable to elicit Volitional Swallow: Able to elicit Anatomy: Within functional limits Pharyngeal Secretions: Standing secretions in (comment) (pharynx  mixed with barium)    Reason for Referral Objectively evaluate swallowing function   Oral Phase Oral - Nectar Oral - Nectar Teaspoon: Weak lingual manipulation;Delayed oral  transit;Lingual pumping;Holding of bolus Oral - Nectar Cup: Delayed oral transit;Weak lingual  manipulation;Lingual pumping;Holding of bolus Oral - Thin Oral - Thin Teaspoon: Lingual pumping;Holding of bolus;Weak  lingual manipulation;Delayed oral transit Oral - Thin Cup: Delayed oral transit;Weak lingual  manipulation;Lingual pumping;Holding of bolus Oral - Solids Oral - Puree: Delayed oral transit;Lingual pumping;Weak lingual  manipulation   Pharyngeal Phase Pharyngeal - Nectar Pharyngeal - Nectar Teaspoon: Delayed swallow  initiation;Premature spillage to valleculae;Reduced laryngeal  elevation;Reduced airway/laryngeal closure;Reduced tongue base  retraction;Pharyngeal residue - valleculae;Pharyngeal residue -  pyriform sinuses;Lateral channel residue Pharyngeal - Nectar Cup: Premature spillage to valleculae;Delayed  swallow initiation;Reduced epiglottic inversion;Reduced anterior  laryngeal mobility;Reduced pharyngeal peristalsis;Reduced tongue  base retraction;Pharyngeal residue - valleculae;Pharyngeal  residue - pyriform sinuses;Penetration/Aspiration during  swallow;Lateral channel residue;Reduced laryngeal  elevation;Reduced airway/laryngeal closure Penetration/Aspiration details (nectar cup): Material enters   airway, remains ABOVE vocal cords and not ejected out Pharyngeal - Thin Pharyngeal - Thin Teaspoon: Premature spillage to  valleculae;Delayed swallow initiation;Lateral channel  residue;Pharyngeal residue - pyriform sinuses;Pharyngeal residue  - valleculae;Reduced anterior laryngeal mobility;Reduced  epiglottic inversion;Reduced pharyngeal peristalsis;Reduced  laryngeal elevation;Reduced airway/laryngeal closure;Reduced  tongue base retraction Pharyngeal - Thin Cup: Delayed swallow initiation;Premature  spillage to valleculae;Penetration/Aspiration during swallow Penetration/Aspiration details (thin cup): Material enters  airway, remains ABOVE vocal cords and not ejected out Pharyngeal - Solids Pharyngeal - Puree: Delayed swallow initiation;Premature spillage  to valleculae;Reduced pharyngeal peristalsis;Reduced epiglottic  inversion;Reduced anterior laryngeal mobility;Reduced laryngeal  elevation;Reduced airway/laryngeal closure;Reduced tongue base  retraction;Pharyngeal residue - valleculae;Pharyngeal residue -  pyriform sinuses;Lateral channel residue;Compensatory strategies  attempted (Comment) (mixed with secretions) Penetration/Aspiration details (puree): Material does not enter  airway  Cervical Esophageal Phase    GO    Cervical Esophageal Phase Cervical Esophageal Phase: Impaired Cervical Esophageal Phase - Nectar Nectar Teaspoon: Reduced cricopharyngeal relaxation Cervical Esophageal Phase - Thin Thin Teaspoon: Reduced cricopharyngeal relaxation Thin Cup: Reduced cricopharyngeal relaxation Cervical Esophageal Phase - Solids Puree: Reduced cricopharyngeal relaxation  Cervical Esophageal Phase - Comment Cervical Esophageal Comment: appearance of minimal amount of  stasis at distal region with esophageal sweep x1, decreased ues  opening results in increase pyriform sinus stasis         Donavan Burnet, MS [facility] SLP [phone]     Scheduled Meds: . antiseptic oral rinse  15 mL Mouth Rinse q12n4p  . aspirin EC   81 mg Oral Daily  . cefTRIAXone (ROCEPHIN)  IV  1 g Intravenous Daily  . chlorhexidine  15 mL Mouth Rinse BID  . clindamycin (CLEOCIN) IV  600 mg Intravenous Q8H  . donepezil  10 mg Oral QHS  . enoxaparin (LOVENOX) injection  40 mg Subcutaneous Q24H  . insulin aspart  0-9 Units Subcutaneous TID WC  . potassium chloride  40 mEq Oral Once  . QUEtiapine  25 mg Oral Daily  . sertraline  150 mg Oral Daily  . sodium chloride  1,000 mL Intravenous Once  . sodium chloride  3 mL Intravenous Q12H  . traZODone  25 mg Oral Daily   Continuous Infusions: . dextrose 5 % and 0.45% NaCl 75 mL/hr at 07/09/12 0554    Principal Problem:   Acute renal failure Active Problems:   DM   Alzheimer's disease   Anemia in chronic kidney disease(285.21)   UTI (lower urinary tract infection)   Cellulitis and abscess of leg    Time spent: 25 minutes.     Marqueta Pulley  [facility] Pager [phone]. If 7PM-7AM, please contact night-coverage at [email or web address], password [facility] 07/09/2012, 12:01 PM  LOS: 6 days

## 2012-07-10 LAB — GLUCOSE, CAPILLARY: Glucose-Capillary: 86 mg/dL (ref 70–99)

## 2012-07-10 LAB — BASIC METABOLIC PANEL
CO2: 20 mEq/L (ref 19–32)
Glucose, Bld: 138 mg/dL — ABNORMAL HIGH (ref 70–99)
Potassium: 3.4 mEq/L — ABNORMAL LOW (ref 3.5–5.1)
Sodium: 142 mEq/L (ref 135–145)

## 2012-07-10 LAB — CULTURE, BLOOD (ROUTINE X 2): Culture: NO GROWTH

## 2012-07-10 LAB — CBC
Hemoglobin: 8.1 g/dL — ABNORMAL LOW (ref 13.0–17.0)
MCH: 25.6 pg — ABNORMAL LOW (ref 26.0–34.0)
MCV: 77.2 fL — ABNORMAL LOW (ref 78.0–100.0)
RBC: 3.16 MIL/uL — ABNORMAL LOW (ref 4.22–5.81)

## 2012-07-10 MED ORDER — POTASSIUM CHLORIDE CRYS ER 20 MEQ PO TBCR
40.0000 meq | EXTENDED_RELEASE_TABLET | Freq: Once | ORAL | Status: AC
  Administered 2012-07-10: 40 meq via ORAL
  Filled 2012-07-10: qty 2

## 2012-07-10 NOTE — Progress Notes (Signed)
Physical Therapy Treatment Patient Details Name: [name] MRN: [id number] DOB: 1945/02/18 Today's Date: 07/10/2012 Time: 0102-7253 PT Time Calculation (min): 27 min  PT Assessment / Plan / Recommendation  PT Comments   Pt was not able to assist as much with mobility today.  Motor planning problems as well and decreased ROM and strenght in LLE from edema impairing mobility.  Recommend continued OOB with nursing and maximove prn.  Pt will benefit from continued PT at SNF to decrease burden of care  Follow Up Recommendations  SNF;Supervision/Assistance - 24 hour     Does the patient have the potential to tolerate intense rehabilitation     Barriers to Discharge        Equipment Recommendations  None recommended by PT    Recommendations for Other Services    Frequency Min 3X/week   Progress towards PT Goals Progress towards PT goals: Not progressing toward goals - comment (pt not able to assist as much today)  Plan Current plan remains appropriate    Precautions / Restrictions Precautions Precautions: Fall Precaution Comments: dressing on Lforefoot Restrictions Weight Bearing Restrictions: Yes   Pertinent Vitals/Pain Pain edema, warmth and redness in LLE    Mobility  Bed Mobility Bed Mobility: Supine to Sit Supine to Sit: 1: +2 Total assist Supine to Sit: Patient Percentage: 40% Details for Bed Mobility Assistance: pt required increased visual and physical cues due to cognition/HOH as well as increased time Pt has difficulty bending knees to get legs under him in sitting to Transfers Sit to Stand: 1: +2 Total assist;From bed Stand to Sit: 1: +2 Total assist;To chair/3-in-1 Stand to Sit: Patient Percentage: 20% Stand Pivot Transfers: 1: +2 Total assist Details for Transfer Assistance: pt needed to be pulled up into standing with lift pad and was unable to stand tall, weight shift and step around to chair.  He has to be guided over by hips to get to chair.  From there,  he could initialte standing, but could not translate hands from armrests of chair to walker to stand erect Ambulation/Gait Ambulation/Gait Assistance: Not tested (comment) (pt unable to weight shift and step)    Exercises General Exercises - Lower Extremity Gluteal Sets: AROM;Both;5 reps Short Arc Quad: AROM;Right;5 reps;Supine Long Arc Quad: AROM;Both;10 reps;Seated   PT Diagnosis:    PT Problem List:   PT Treatment Interventions:     PT Goals (current goals can now be found in the care plan section) Acute Rehab PT Goals Patient Stated Goal: None stated  Visit Information  Last PT Received On: 07/10/12 Assistance Needed: +2 History of Present Illness: Pt continued with cellulitis of L great toe with warmness, redness and edema of L lower leg    Subjective Data  Patient Stated Goal: None stated   Cognition  Cognition Arousal/Alertness: Awake/alert Behavior During Therapy: WFL for tasks assessed/performed Overall Cognitive Status: History of cognitive impairments - at baseline    Balance  Balance Balance Assessed: Yes Static Sitting Balance Static Sitting - Balance Support: Bilateral upper extremity supported Static Sitting - Level of Assistance: 5: Stand by assistance Static Sitting - Comment/# of Minutes: pt tends to lean backwards, unable to get legs under him Static Standing Balance Static Standing - Balance Support: Bilateral upper extremity supported Static Standing - Level of Assistance: 3: Mod assist Static Standing - Comment/# of Minutes: cues to stand up straight, pt tends to stay flexed  End of Session PT - End of Session Equipment Utilized During Treatment: Gait belt  Activity Tolerance: Patient limited by fatigue Patient left: in chair;with call bell/phone within reach;with chair alarm set;Other (comment) (maximove pad in place) Nurse Communication: Need for lift equipment   GP     Donnetta Hail 07/10/2012, 3:24 PM

## 2012-07-10 NOTE — Progress Notes (Signed)
Speech Language Pathology Dysphagia Treatment Patient Details Name: [name] MRN: [id number] DOB: [birth year] Today's Date: 07/10/2012 Time: 1204-1227 SLP Time Calculation (min): 23 min  Assessment / Plan / Recommendation Clinical Impression  Pt seen for skilled dyspahgia treatment to assess tolerance of po diet *full liquids and readiness for dietary advancement.  Pt self feeding today icecream and orange juice - he is not conducting dry swallows due to cognition.  However pt is not overtly coughing with intake *silent asp on MBS last week*.  As pt is afebrile and lungs are diminished today, he appears to be tolerating intake he will consume.  Will need to rely on vital signs and imaging studies if conducted *c-xray* in determining tolerance due to silent nature of dysphagia- no sensation to stasis or aspiration.   RN reports pt is picky and enjoys orange juice and orange sherbert.  Pt overtly declined to consume Ensure offered by SLP.  RN reports pt was coughing some yesterday with intake and has improved tolerance today.  SLP spoke to MD who stated pt not ready for dc, therefore recommend continue full liquid diet and slp to follow.     Chronicity of pt's dysphagia and his cognition will make him a long term aspiration risk, but he appears to be tolerating intake currently.     Diet Recommendation  Continue with Current Diet:  (full liquids no pudding or grits)    SLP Plan Continue with current plan of care   Pertinent Vitals/Pain Afebrile, decreased   Swallowing Goals  SLP Swallowing Goals Patient will utilize recommended strategies during swallow to increase swallowing safety with: Maximal cueing Swallow Study Goal #2 - Progress: Progressing toward goal Swallow Study Goal #3 - Progress: Discontinued (comment) (pt has dementia, does not follow directions to cough on comm) Swallow Study Goal #4 - Progress: Not met (family not present)  General    Oral Cavity - Oral Hygiene  pt feeding self sherbet, no oral stasis  Dysphagia Treatment Treatment focused on: Skilled observation of diet tolerance;Utilization of compensatory strategies Treatment Methods/Modalities: Skilled observation;Differential diagnosis Patient observed directly with PO's: Yes Type of PO's observed: Thin liquids (icecream) Feeding: Able to feed self Liquids provided via: Cup;Teaspoon Pharyngeal Phase Signs & Symptoms: Suspected delayed swallow initiation Type of cueing: Verbal Amount of cueing: Maximal   GO Donavan Burnet, MS [facility] SLP [phone]

## 2012-07-10 NOTE — Progress Notes (Signed)
[facility] PROGRESS NOTE  REGGINALD PASK [name]:[id number] DOB: 1945/05/07 DOA: 07/03/2012 PCP: Terald Sleeper, MD  Assessment/Plan:  1-Cellulitis of LLE, ulcer big toe.  -Improving.  -continue  clindamycin day 7. -dopplers negative for  DVT   2. Leukocytosis  -trending down today at 9.  -Others possibility aspiration PNA. Clindamycin should cover for aspiration PNA. Chest x ray 6-28  with infiltrates, pulmonary edema. -Urine grew E coli. Continue with ceftriaxone day 5/7.   3. ARF on CKD: baseline creatinine 1.4  -Component of rhabdomyolysis. Total Ck decrease to 229.  -Continue with IV fluids. hold lasix  -renal US: No acute urinary pathology. Mild cortical thinning bilaterally. -good urine out put. Cr trending down. Today at 1.8 -Decrease IV fluids due to mild pulmonary edema.    4. Hyperkalemia  -Resolved.  -Received kayexalate and insulin.   5. Dementia: moderately advanced  -Patient was able to engage in a conversation today.  -continue aricept, seroquel   6. DM  -continue levemir, SSI  7. DVt proph: lovenox 8-UTI, Urine grew 100-K E coli sensitive to  ceftriaxone. Continue with ceftriaxone day 4. 9-Anemia: monitor for bleed.  10-Dysphagia: Patient with severe oral  pharyngeal dysphagia. Patient tolerating clear. Will repeat swallow evaluation today. Will discussed option with sister.   Code Status: Full Code.  Family Communication: sister.  Disposition Plan: Back to SNF when stable.    Consultants:  Wound care nurse.   Procedures:  Doppler LE negative for DVT.   Antibiotics:  Clindamycin 6-24   Ceftriaxone 6-26  HPI/Subjective: Feeling ok, no complaints. Breathing well. He would like to eat.   Objective: Filed Vitals:   07/09/12 0610 07/09/12 1339 07/09/12 2000 07/10/12 0413  BP: 140/58 151/63 156/54 141/61  Pulse: 65 63 70 88  Temp: 97.5 F (36.4 C) 98.1 F (36.7 C) 97.9 F (36.6 C) 99 F (37.2 C)  TempSrc: Oral Oral Oral Oral   Resp: 18 16 16 16   Height:      Weight:      SpO2: 100% 100% 100% 100%    Intake/Output Summary (Last 24 hours) at 07/10/12 1224 Last data filed at 07/10/12 1043  Gross per 24 hour  Intake 3346.25 ml  Output   1975 ml  Net 1371.25 ml   Filed Weights   07/04/12 0200  Weight: 100.8 kg (222 lb 3.6 oz)    Exam:   General:  No distress.   Cardiovascular:S 1, S 2 RRR  Respiratory: Bilateral ronchus.   Abdomen: Bs present, soft, NT  Musculoskeletal: left LE with erythema, edema. Left big toe with ulcer   Data Reviewed: Basic Metabolic Panel:  Recent Labs Lab 07/06/12 0525 07/07/12 0503 07/08/12 0425 07/09/12 1026 07/10/12 0440  NA 135 139 146* 144 142  K 3.7 3.7 3.7 3.2* 3.4*  CL 107 111 116* 112 111  CO2 18* 18* 20 19 20   GLUCOSE 79 104* 121* 126* 138*  BUN 45* 40* 36* 33* 31*  CREATININE 3.01* 2.50* 2.32* 2.02* 1.89*  CALCIUM 8.6 8.6 8.6 9.0 9.1   Liver Function Tests: No results found for this basename: AST, ALT, ALKPHOS, BILITOT, PROT, ALBUMIN,  in the last 168 hours No results found for this basename: LIPASE, AMYLASE,  in the last 168 hours No results found for this basename: AMMONIA,  in the last 168 hours CBC:  Recent Labs Lab 07/03/12 2100  07/06/12 0525 07/07/12 0503 07/08/12 0425 07/09/12 1026 07/10/12 0440  WBC 16.8*  < > 24.4* 22.5* 10.6* 9.8 8.4  NEUTROABS 15.2*  --   --   --   --   --   --   HGB 10.3*  < > 7.8* 8.2* 8.1* 8.5* 8.1*  HCT 31.3*  < > 22.6* 23.6* 23.7* 25.4* 24.4*  MCV 80.9  < > 76.9* 76.9* 76.9* 77.2* 77.2*  PLT 178  < > 148* 166 169 180 187  < > = values in this interval not displayed. Cardiac Enzymes:  Recent Labs Lab 07/04/12 0630 07/06/12 0525  CKTOTAL 1457* 229  CKMB  --  5.8*   BNP (last 3 results) No results found for this basename: PROBNP,  in the last 8760 hours CBG:  Recent Labs Lab 07/09/12 1957 07/09/12 2358 07/10/12 0413 07/10/12 0739 07/10/12 1215  GLUCAP 141* 86 139* 113* 129*    Recent  Results (from the past 240 hour(s))  CULTURE, BLOOD (ROUTINE X 2)     Status: None   Collection Time    07/03/12  9:00 PM      Result Value Range Status   Specimen Description BLOOD LEFT ANTECUBITAL   Final   Special Requests BOTTLES DRAWN AEROBIC AND ANAEROBIC 3CC   Final   Culture  Setup Time 07/04/2012 04:42   Final   Culture NO GROWTH 5 DAYS   Final   Report Status 07/10/2012 FINAL   Final  URINE CULTURE     Status: None   Collection Time    07/03/12  9:12 PM      Result Value Range Status   Specimen Description URINE, CATHETERIZED   Final   Special Requests NONE   Final   Culture  Setup Time 07/04/2012 04:07   Final   Colony Count >=100,000 COLONIES/ML   Final   Culture     Final   Value: ESCHERICHIA COLI     Note: Two isolates with different morphologies were identified as the same organism.The most resistant organism was reported.   Report Status 07/06/2012 FINAL   Final   Organism ID, Bacteria ESCHERICHIA COLI   Final  CULTURE, BLOOD (ROUTINE X 2)     Status: None   Collection Time    07/03/12 10:30 PM      Result Value Range Status   Specimen Description BLOOD LEFT HAND   Final   Special Requests BOTTLES DRAWN AEROBIC AND ANAEROBIC 3CC   Final   Culture  Setup Time 07/04/2012 01:32   Final   Culture NO GROWTH 5 DAYS   Final   Report Status 07/10/2012 FINAL   Final  MRSA PCR SCREENING     Status: None   Collection Time    07/04/12  2:44 AM      Result Value Range Status   MRSA by PCR NEGATIVE  NEGATIVE Final   Comment:            The GeneXpert MRSA Assay (FDA     approved for NASAL specimens     only), is one component of a     comprehensive MRSA colonization     surveillance program. It is not     intended to diagnose MRSA     infection nor to guide or     monitor treatment for     MRSA infections.  CLOSTRIDIUM DIFFICILE BY PCR     Status: None   Collection Time    07/06/12 11:22 AM      Result Value Range Status   C difficile by pcr NEGATIVE  NEGATIVE  Final  Studies: Dg Swallowing Func-speech Pathology  07/08/2012   Chales Abrahams, CCC-SLP     07/08/2012  5:31 PM Objective Swallowing Evaluation: Modified Barium Swallowing Study   Patient Details  Name: [name] MRN: [id number] Date of Birth: 1945-06-20  Today's Date: 07/08/2012 Time: 1610-1650 SLP Time Calculation (min): 40 min  Past Medical History:  Past Medical History  Diagnosis Date  . Hyponatremia   . Hypokalemia   . Anemia   . Diabetes mellitus without complication   . Hypertension   . UTI (lower urinary tract infection)   . Delusional disorder   . HOH (hard of hearing)   . Hyperlipidemia   . CHF (congestive heart failure)   . Renal insufficiency   . Stroke   . Dementia   . Depression   . Dysphagia   . Falls frequently    Past Surgical History:  Past Surgical History  Procedure Laterality Date  . Colon surgery    . Colectomy    . Ileostomy    . Peg tube removal    . Cardiac catheterization    . Cholecystectomy    . Abdominal adhesion surgery     HPI:  67 yo male adm to [facility] from [facility] with cellulitis of left  lower extremity.  Pt with leukocytosis, hyperkalemia, dementia,  DM.   Pt passed an RN stroke swallow screen although he has h/o  severe dysphagia and has undergone multiple MBS studies.  Bedside  swallow evaluation and MBS completed 6/26.  MBS recommended NPO  secondary to severe oropharyngeal dysphagia and silent  aspiration.  Pt for possible PEG placement per primary team. Pt  demonstrating improved mental status today and MD requested  repeat evaluation to assess appropriateness for po intake.  After  seeing pt at bedside, MBS repeated to allow instrumental  evaluation.    Dg Chest 2 View  07/07/2012   *RADIOLOGY REPORT*  Clinical Data: Leukocytosis,  evaluate for aspiration pneumonia  CHEST - 2 VIEW  Comparison:  07/03/2012; 09/13/2008; chest CT - 06/05/2008  Findings:  Grossly  unchanged enlarged cardiac silhouette and mediastinal contours  with slight differences  attributable to the decreased lung  volumes.  Pulmonary vasculature is indistinct with cephalization  of flow.  Worsening bibasilar heterogeneous / consolidative  opacities, left greater than right.  There are at least small  bilateral effusions.  No definite pneumothorax.  Unchanged bones.   IMPRESSION: Hypoventilated examination with findings most  suggestive of pulmonary edema with small bilateral effusions and  bibasilar opacities, left greater than right, atelectasis versus  infiltrate. A follow-up chest radiograph in 4 to 6 weeks after  treatment is recommended to ensure resolution.   Original Report  Authenticated By: Tacey Ruiz, MD       Assessment / Plan / Recommendation Clinical Impression  Dysphagia Diagnosis: Moderate oral phase dysphagia;Moderate  pharyngeal phase dysphagia  Clinical impression: Pt with improved swallow function since MBS  2 days prior and he did not aspirate with any consistency tested.   Pt does have delayed oral transiting, lingual pumping due to his  cognitive deficit and weakness.  Premature spillage of boluses  into pharynx noted with delayed reflexive pharyngeal swallow.   Tongue base and pharyngeal stasis noted across consistencies and  mixed with secretion without pt awareness, nor attempts to dry  swallow to clear.  Stasis was much worse with pudding WITHOUT pt  sensation.  Cued dry swallows conducted approx 25% of  opportunities and were helpful to  decrease stasis but not fully  clear.   Mild amount of laryngeal penetration noted - even with  pt taking sequential cup sips of thin barium (feeding himself).   Pt's inability to follow commands increases his aspiration risk,  but his ability to self feed is helpful.    Pt remains an aspiration risk with po, however at bedside his  swallow is triggered faster than during MBS.  SLP suspects  icecream provided better sensory input and pt consumption of  barium impacted testing validity.    Options to pursue po diet (full liquid -  no grits, pudding) or  continue NPO.   Even if pt were to have PEG, he will still likely  aspirate secretions and asp pna is a risk for pt's with PEG. Pt  intake likely would be adequate when he is fully alert and  feeding himself based on clinical observation today.    For QOL issues *pt enjoyed eating his icecream and consuming  juice* pursuing intake with aspiration mitigation may be feasible  option.  Defer diet decision to MD due to multiple factors and  borderline test result.   Suspect pt with baseline deficits and episodic aspiration that he  manages when well.      Treatment Recommendation  Therapy as outlined in treatment plan below    Diet Recommendation Thin liquid;Nectar-thick liquid;NPO (FULL  LIQUID (no pudding or grits)  OR  NPO)   Liquid Administration via: Cup Medication Administration: Crushed with puree (crush with  icecream, follow with liquid) Supervision: Patient able to self feed;Full supervision/cueing  for compensatory strategies Compensations: Slow rate;Small sips/bites;Check for pocketing  (try to get pt to dry swallow) Postural Changes and/or Swallow Maneuvers: Seated upright 90  degrees;Upright 30-60 min after meal    Other  Recommendations Oral Care Recommendations: Oral care BID   Follow Up Recommendations  Skilled Nursing facility    Frequency and Duration min 2x/week  2 weeks   Pertinent Vitals/Pain afebrile    SLP Swallow Goals Patient will utilize recommended strategies during swallow to  increase swallowing safety with: Maximal cueing Swallow Study Goal #3 - Progress: Not met (pt cognition prevents  following directions) Goal #4: Pt's family will determine if decide for pt to have po  with known risks with compensations to decrease aspiration risk.    Swallow Study Goal #4 - Progress: Not met (no family present)   General HPI: 67 yo male adm to [facility] from [place] with  cellulitis of left lower extremity.  Pt with leukocytosis,  hyperkalemia, dementia, DM.   Pt passed an RN  stroke swallow  screen although he has h/o severe dysphagia and has undergone  multiple MBS studies.  Bedside swallow evaluation and MBS  completed 6/26.  MBS recommended NPO secondary to severe  oropharyngeal dysphagia and silent aspiration.  Pt for possible  PEG placement per primary team. Pt demonstrating improved mental  status today and MD requested repeat evaluation to assess  appropriateness for po intake.  After seeing pt at bedside, MBS  repeated to allow instrumental evaluation.  Type of Study: Modified Barium Swallowing Study Reason for Referral: Objectively evaluate swallowing function Previous Swallow Assessment: MBS two days prior  Diet Prior to this Study: NPO Temperature Spikes Noted: No Respiratory Status: Room air History of Recent Intubation: No Behavior/Cognition: Alert;Decreased sustained attention;Hard of  hearing (inconsistent ability to follow commands) Oral Cavity - Dentition: Edentulous Oral Motor / Sensory Function: Impaired - see Bedside swallow  eval Self-Feeding Abilities:  Able to feed self Patient Positioning: Upright in chair Baseline Vocal Quality: Low vocal intensity;Breathy Volitional Cough: Cognitively unable to elicit Volitional Swallow: Able to elicit Anatomy: Within functional limits Pharyngeal Secretions: Standing secretions in (comment) (pharynx  mixed with barium)    Reason for Referral Objectively evaluate swallowing function   Oral Phase Oral - Nectar Oral - Nectar Teaspoon: Weak lingual manipulation;Delayed oral  transit;Lingual pumping;Holding of bolus Oral - Nectar Cup: Delayed oral transit;Weak lingual  manipulation;Lingual pumping;Holding of bolus Oral - Thin Oral - Thin Teaspoon: Lingual pumping;Holding of bolus;Weak  lingual manipulation;Delayed oral transit Oral - Thin Cup: Delayed oral transit;Weak lingual  manipulation;Lingual pumping;Holding of bolus Oral - Solids Oral - Puree: Delayed oral transit;Lingual pumping;Weak lingual  manipulation   Pharyngeal Phase  Pharyngeal - Nectar Pharyngeal - Nectar Teaspoon: Delayed swallow  initiation;Premature spillage to valleculae;Reduced laryngeal  elevation;Reduced airway/laryngeal closure;Reduced tongue base  retraction;Pharyngeal residue - valleculae;Pharyngeal residue -  pyriform sinuses;Lateral channel residue Pharyngeal - Nectar Cup: Premature spillage to valleculae;Delayed  swallow initiation;Reduced epiglottic inversion;Reduced anterior  laryngeal mobility;Reduced pharyngeal peristalsis;Reduced tongue  base retraction;Pharyngeal residue - valleculae;Pharyngeal  residue - pyriform sinuses;Penetration/Aspiration during  swallow;Lateral channel residue;Reduced laryngeal  elevation;Reduced airway/laryngeal closure Penetration/Aspiration details (nectar cup): Material enters  airway, remains ABOVE vocal cords and not ejected out Pharyngeal - Thin Pharyngeal - Thin Teaspoon: Premature spillage to  valleculae;Delayed swallow initiation;Lateral channel  residue;Pharyngeal residue - pyriform sinuses;Pharyngeal residue  - valleculae;Reduced anterior laryngeal mobility;Reduced  epiglottic inversion;Reduced pharyngeal peristalsis;Reduced  laryngeal elevation;Reduced airway/laryngeal closure;Reduced  tongue base retraction Pharyngeal - Thin Cup: Delayed swallow initiation;Premature  spillage to valleculae;Penetration/Aspiration during swallow Penetration/Aspiration details (thin cup): Material enters  airway, remains ABOVE vocal cords and not ejected out Pharyngeal - Solids Pharyngeal - Puree: Delayed swallow initiation;Premature spillage  to valleculae;Reduced pharyngeal peristalsis;Reduced epiglottic  inversion;Reduced anterior laryngeal mobility;Reduced laryngeal  elevation;Reduced airway/laryngeal closure;Reduced tongue base  retraction;Pharyngeal residue - valleculae;Pharyngeal residue -  pyriform sinuses;Lateral channel residue;Compensatory strategies  attempted (Comment) (mixed with secretions) Penetration/Aspiration details  (puree): Material does not enter  airway  Cervical Esophageal Phase    GO    Cervical Esophageal Phase Cervical Esophageal Phase: Impaired Cervical Esophageal Phase - Nectar Nectar Teaspoon: Reduced cricopharyngeal relaxation Cervical Esophageal Phase - Thin Thin Teaspoon: Reduced cricopharyngeal relaxation Thin Cup: Reduced cricopharyngeal relaxation Cervical Esophageal Phase - Solids Puree: Reduced cricopharyngeal relaxation Cervical Esophageal Phase - Comment Cervical Esophageal Comment: appearance of minimal amount of  stasis at distal region with esophageal sweep x1, decreased ues  opening results in increase pyriform sinus stasis         Donavan Burnet, MS [facility] SLP [phone]     Scheduled Meds: . antiseptic oral rinse  15 mL Mouth Rinse q12n4p  . aspirin EC  81 mg Oral Daily  . cefTRIAXone (ROCEPHIN)  IV  1 g Intravenous Daily  . chlorhexidine  15 mL Mouth Rinse BID  . clindamycin  300 mg Oral Q8H  . donepezil  10 mg Oral QHS  . enoxaparin (LOVENOX) injection  40 mg Subcutaneous Q24H  . insulin aspart  0-9 Units Subcutaneous TID WC  . potassium chloride  40 mEq Oral Once  . QUEtiapine  25 mg Oral Daily  . sertraline  150 mg Oral Daily  . sodium chloride  1,000 mL Intravenous Once  . sodium chloride  3 mL Intravenous Q12H  . traZODone  25 mg Oral Daily   Continuous Infusions: . dextrose 5 % and 0.45% NaCl 75 mL/hr at 07/10/12 0815    Principal Problem:  Acute renal failure Active Problems:   DM   Alzheimer's disease   Anemia in chronic kidney disease(285.21)   UTI (lower urinary tract infection)   Cellulitis and abscess of leg    Time spent: 25 minutes.     Chelcey Caputo  [facility] Pager [phone]. If 7PM-7AM, please contact night-coverage at [email or web address], password [facility] 07/10/2012, 12:24 PM  LOS: 7 days

## 2012-07-11 LAB — BASIC METABOLIC PANEL
BUN: 28 mg/dL — ABNORMAL HIGH (ref 6–23)
CO2: 22 mEq/L (ref 19–32)
Chloride: 111 mEq/L (ref 96–112)
Creatinine, Ser: 1.73 mg/dL — ABNORMAL HIGH (ref 0.50–1.35)

## 2012-07-11 LAB — CBC
HCT: 24.9 % — ABNORMAL LOW (ref 39.0–52.0)
Hemoglobin: 8.4 g/dL — ABNORMAL LOW (ref 13.0–17.0)
MCV: 77.3 fL — ABNORMAL LOW (ref 78.0–100.0)
RBC: 3.22 MIL/uL — ABNORMAL LOW (ref 4.22–5.81)
WBC: 9.8 10*3/uL (ref 4.0–10.5)

## 2012-07-11 LAB — GLUCOSE, CAPILLARY
Glucose-Capillary: 101 mg/dL — ABNORMAL HIGH (ref 70–99)
Glucose-Capillary: 107 mg/dL — ABNORMAL HIGH (ref 70–99)
Glucose-Capillary: 130 mg/dL — ABNORMAL HIGH (ref 70–99)
Glucose-Capillary: 95 mg/dL (ref 70–99)

## 2012-07-11 MED ORDER — BOOST / RESOURCE BREEZE PO LIQD
1.0000 | Freq: Two times a day (BID) | ORAL | Status: DC
  Administered 2012-07-11 – 2012-07-12 (×3): 1 via ORAL

## 2012-07-11 NOTE — Progress Notes (Addendum)
Occupational Therapy Treatment and goal update Patient Details Name: [name] MRN: [id number] DOB: 1945/09/08 Today's Date: 07/11/2012 Time: 8469-6295 OT Time Calculation (min): 14 min  OT Assessment / Plan / Recommendation  OT comments  Slow progress with OT.  Pt smiled often and did participate with multimodal cues  Follow Up Recommendations  SNF    Barriers to Discharge       Equipment Recommendations  None recommended by OT    Recommendations for Other Services    Frequency Min 2X/week   Progress towards OT Goals Progress towards OT goals: Goals updated - see care plan;Not progressing toward goals - comment (slow progress)  Plan      Precautions / Restrictions Precautions Precautions: Fall Precaution Comments: dressing on Lforefoot Restrictions Weight Bearing Restrictions: Yes Other Position/Activity Restrictions: r forefoot wrapped   Pertinent Vitals/Pain No signs/symptoms of pain.      ADL  Upper Body Bathing: Performed;+2 Total assistance Upper Body Bathing: Patient Percentage: 30% Upper Body Dressing:  Performed, max A, supported sitting Where Assessed - Upper Body Bathing: Supported sitting Toilet Transfer: Simulated;+2 Total assistance Toilet Transfer: Patient Percentage: 40% Statistician Method: Sit to Barista:  (chair to bed) Equipment Used: Gait belt;Rolling walker Transfers/Ambulation Related to ADLs: pt had difficulty sitting unsupported during adl to wash back:  A x 2 needed, so that pt could maintain forward position to have back washed.  Pt had easier time manipulating washcloth with RUE--needed hand over hand assist with LUE.  When standing and initiating standing, pt had strong posterior lean.  Once up, one person could maintain him with mod A and second person there for safety and to perform washing.   ADL Comments: cues with LUE.  Pt initiated using RUE without cues.    OT Diagnosis:    OT Problem List:   OT  Treatment Interventions:     OT Goals(current goals can now be found in the care plan section) Acute Rehab OT Goals Patient Stated Goal: None stated  Visit Information  Last OT Received On: 07/11/12 Assistance Needed: +2 History of Present Illness: Pt continued with cellulitis of L great toe with warmness, redness and edema of L lower leg    Subjective Data      Prior Functioning       Cognition  Cognition Arousal/Alertness: Awake/alert Behavior During Therapy: WFL for tasks assessed/performed Overall Cognitive Status: History of cognitive impairments - at baseline    Mobility  Bed Mobility Bed Mobility:  Sit to Supine: 1: +2 Total assist Sit to Supine: Patient Percentage: 30% Details for Bed Mobility Assistance: pt initiated lifting LLE onto bed.  Total A to reposition Transfers Sit to Stand: 1: +2 Total assist;From chair/3-in-1 Sit to Stand: Patient Percentage: 40% Stand to Sit: 1: +2 Total assist;To chair/3-in-1 Stand to Sit: Patient Percentage: 40% Details for Transfer Assistance: multimodal cues given.  Pt able to move feet back with cues.  Assist for trunk to rise and come out of posterior lean.  Once upright, mod A to maintain this with second person for safety    Exercises     Balance Balance Balance Assessed: Yes Static Sitting Balance Static Sitting - Balance Support: Bilateral upper extremity supported Static Sitting - Level of Assistance: 3: Mod assist Static Sitting - Comment/# of Minutes: 2 minutes. Pt had been sitting up in the chair for a few hours Static Standing Balance Static Standing - Balance Support: Bilateral upper extremity supported Static Standing - Level of  Assistance: 3: Mod assist Static Standing - Comment/# of Minutes: 1   End of Session OT - End of Session Activity Tolerance: Patient tolerated treatment well Patient left: in bed;with call bell/phone within reach (NT present)  GO     [name] 07/11/2012, 4:14 PM [name], OTR/L [phone] 07/11/2012

## 2012-07-11 NOTE — Progress Notes (Signed)
Physical Therapy Treatment Patient Details Name: [name] MRN: [id number] DOB: 09-19-1945 Today's Date: 07/11/2012 Time: 1125-1140 PT Time Calculation (min): 15 min  PT Assessment / Plan / Recommendation  PT Comments   *+2 assist for bed to chair, pain/weakness in LLE limiting activity tolerance and progress with mobility **  Follow Up Recommendations  SNF;Supervision/Assistance - 24 hour     Does the patient have the potential to tolerate intense rehabilitation     Barriers to Discharge        Equipment Recommendations  None recommended by PT    Recommendations for Other Services    Frequency Min 3X/week   Progress towards PT Goals Progress towards PT goals: Not progressing toward goals - comment  Plan Current plan remains appropriate    Precautions / Restrictions Precautions Precautions: Fall Precaution Comments: dressing on Lforefoot Restrictions Weight Bearing Restrictions: Yes   Pertinent Vitals/Pain *pt grimmaced with movement of LLE No signs of pain at rest Pt repositioned at end of tx**    Mobility  Bed Mobility Bed Mobility: Supine to Sit Supine to Sit: 1: +2 Total assist Supine to Sit: Patient Percentage: 40% Details for Bed Mobility Assistance: pt required increased visual and physical cues due to cognition/HOH as well as increased time  Transfers Transfers: Sit to Stand;Stand to Sit;Stand Pivot Transfers Sit to Stand: 1: +2 Total assist;From bed Sit to Stand: Patient Percentage: 40% Stand to Sit: 1: +2 Total assist;To chair/3-in-1 Stand to Sit: Patient Percentage: 40% Stand Pivot Transfers: 1: +2 Total assist Stand Pivot Transfers: Patient Percentage: 40% Details for Transfer Assistance: pt needed to be pulled up into standing with lift pad and was unable to stand tall, weight shift and step around to chair.  He has to be guided over by hips to get to chair.  From there, he could initialte standing, but could not translate hands from armrests of  chair to walker to stand erect Ambulation/Gait Ambulation/Gait Assistance: Not tested (comment) (pt unable to weight shift and step)    Exercises General Exercises - Lower Extremity Heel Slides: AAROM;Left;5 reps;Supine Hip ABduction/ADduction: AAROM;Left;5 reps;Supine   PT Diagnosis:    PT Problem List:   PT Treatment Interventions:     PT Goals (current goals can now be found in the care plan section) Acute Rehab PT Goals Patient Stated Goal: None stated PT Goal Formulation: Patient unable to participate in goal setting Time For Goal Achievement: 07/13/12 Potential to Achieve Goals: Fair  Visit Information  Last PT Received On: 07/11/12 Assistance Needed: +2 History of Present Illness: Pt continued with cellulitis of L great toe with warmness, redness and edema of L lower leg    Subjective Data  Patient Stated Goal: None stated   Cognition  Cognition Arousal/Alertness: Awake/alert Behavior During Therapy: WFL for tasks assessed/performed Overall Cognitive Status: History of cognitive impairments - at baseline    Balance  Balance Balance Assessed: Yes Static Sitting Balance Static Sitting - Balance Support: Bilateral upper extremity supported Static Sitting - Level of Assistance: 5: Stand by assistance Static Sitting - Comment/# of Minutes: 3 Static Standing Balance Static Standing - Balance Support: Bilateral upper extremity supported Static Standing - Level of Assistance: 3: Mod assist Static Standing - Comment/# of Minutes: 1  End of Session PT - End of Session Equipment Utilized During Treatment: Gait belt Activity Tolerance: Patient limited by fatigue Patient left: in chair;with call bell/phone within reach;with chair alarm set;Other (comment) (maximove pad in place) Nurse Communication: Need for lift equipment  GP     Ralene Bathe Kistler 07/11/2012, 12:31 PM [phone]

## 2012-07-11 NOTE — Progress Notes (Signed)
SLP Cancellation Note  Patient Details Name: [name] MRN: [id number] DOB: February 04, 1945   Cancelled treatment:       Reason Eval/Treat Not Completed: Other (comment) (pt declined to see SLP stating "Go away, I don't want to eat anything")  Note pt for palliative meeting next date- thank you for that referral.     Pt will be at chronic aspiration risk due to his cognitive state preventing him from following directions (dry swallowing, coughing),  h/o cva with resulting sensorimotor deficits and decreased mobility negatively impacting tolerance of aspiration.    As po intake provides pt with QOL, feasible option to allow po with mitigation strategies appears reasonable.  SLP to follow for family and pt education.    Donavan Burnet, MS [facility] SLP [phone]

## 2012-07-11 NOTE — Progress Notes (Signed)
Thank you for consulting the Palliative Medicine Team at [facility] to meet your patient's and family's needs.   The reason that you asked [place] to see your patient is  For Clarification of GOC and options  We have scheduled your patient for a meeting: Wednesday July 2nd at 1200 with Lorinda Creed NP  The Surrogate decision make is: Patient's brother Lauris Chroman # [phone]/ sister Jeannie Done # [phone]  Your patient is able/unable to participate: unable to participate  Lorinda Creed NP  Palliative Medicine Team Team Phone # [phone] Pager [phone]

## 2012-07-11 NOTE — Progress Notes (Addendum)
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 Colonoscoy via ostomy in April 2011 was negative for polyps or recurrent cancer patient admitted 6-23 with fever, Left LE cellulitis, acute on Chronic renal failure, UTI. He has received Clindamycin for LE cellulitis, Ceftriaxone for E coli UTI. He has had worsening dysphagia. He has tolerated clear diet. Palliative care was consulted for goals of care, and option for feeding. His renal function has improved with IV fluids.    1-Cellulitis of LLE, ulcer big toe.  -Improving.  -continue  clindamycin day 8/10. -dopplers negative for  DVT   2. Leukocytosis  -trending down today at 9.  -Others possibility aspiration PNA. Clindamycin should cover for aspiration PNA. Chest x ray 6-28  with infiltrates, pulmonary edema. -Urine grew E coli. Continue with ceftriaxone day 5/7.   3. ARF on CKD: baseline creatinine 1.4  -Component of rhabdomyolysis. Total Ck decrease to 229.  -Continue with IV fluids. hold lasix  -renal US: No acute urinary pathology. Mild cortical thinning bilaterally. -good urine out put. Cr trending down. Today at 1.7 -Decrease IV fluids due to mild pulmonary edema.    4. Hyperkalemia  -Resolved.  -Received kayexalate and insulin.   5. Dementia: moderately advanced  -Patient was able to engage in a conversation today.  -continue aricept, seroquel   6. DM  -continue levemir, SSI   7. DVt proph: lovenox  8-UTI, Urine grew 100-K E coli sensitive to  ceftriaxone. Continue with ceftriaxone day 5.  9-Anemia: monitor for bleed. Hb stable.   10-Dysphagia: Patient with severe oral  pharyngeal dysphagia.  Patient tolerating clear. Family has agreed to speak with palliative regarding different options, artifical feeding. Palliative consulted. If family wants to proceed with  peg tube, GI recommend Peg tube placement by IR.   Code Status: Full Code.  Family Communication: sister.  Disposition Plan: Back to SNF when stable.    Consultants:  Wound care nurse.   Procedures:  Doppler LE negative for DVT.   Antibiotics:  Clindamycin 6-24   Ceftriaxone 6-26  HPI/Subjective: Feeling ok, no complaints. Breathing well.  Tolerating clear diet.   Objective: Filed Vitals:   07/10/12 0413 07/10/12 1349 07/10/12 2040 07/11/12 0441  BP: 141/61 139/55 151/51 155/49  Pulse: 88 66 64 60  Temp: 99 F (37.2 C) 98.3 F (36.8 C) 98.1 F (36.7 C) 98.4 F (36.9 C)  TempSrc: Oral Oral Oral Oral  Resp: 16 18 24 20   Height:      Weight:      SpO2: 100% 100% 100% 100%    Intake/Output Summary (Last 24 hours) at 07/11/12 1349 Last data filed at 07/11/12 0900  Gross per 24 hour  Intake 2068.75 ml  Output   1650 ml  Net 418.75 ml   Filed Weights   07/04/12 0200  Weight: 100.8 kg (222 lb 3.6 oz)    Exam:   General:  No distress.   Cardiovascular:S 1, S 2 RRR  Respiratory: Bilateral ronchus.   Abdomen: Bs present, soft, NT  Musculoskeletal: left LE with decreases erythema, edema. Left big toe with ulcer   Data Reviewed: Basic Metabolic Panel:  Recent Labs Lab 07/07/12 0503 07/08/12 0425 07/09/12 1026 07/10/12 0440 07/11/12 0530  NA 139 146*  144 142 142  K 3.7 3.7 3.2* 3.4* 3.6  CL 111 116* 112 111 111  CO2 18* 20 19 20 22   GLUCOSE 104* 121* 126* 138* 113*  BUN 40* 36* 33* 31* 28*  CREATININE 2.50* 2.32* 2.02* 1.89* 1.73*  CALCIUM 8.6 8.6 9.0 9.1 9.1   Liver Function Tests: No results found for this basename: AST, ALT, ALKPHOS, BILITOT, PROT, ALBUMIN,  in the last 168 hours No results found for this basename: LIPASE, AMYLASE,  in the last 168 hours No results found  for this basename: AMMONIA,  in the last 168 hours CBC:  Recent Labs Lab 07/07/12 0503 07/08/12 0425 07/09/12 1026 07/10/12 0440 07/11/12 0530  WBC 22.5* 10.6* 9.8 8.4 9.8  HGB 8.2* 8.1* 8.5* 8.1* 8.4*  HCT 23.6* 23.7* 25.4* 24.4* 24.9*  MCV 76.9* 76.9* 77.2* 77.2* 77.3*  PLT 166 169 180 187 193   Cardiac Enzymes:  Recent Labs Lab 07/06/12 0525  CKTOTAL 229  CKMB 5.8*   BNP (last 3 results) No results found for this basename: PROBNP,  in the last 8760 hours CBG:  Recent Labs Lab 07/10/12 1628 07/10/12 2038 07/11/12 0005 07/11/12 0437 07/11/12 0742  GLUCAP 138* 118* 107* 95 105*    Recent Results (from the past 240 hour(s))  CULTURE, BLOOD (ROUTINE X 2)     Status: None   Collection Time    07/03/12  9:00 PM      Result Value Range Status   Specimen Description BLOOD LEFT ANTECUBITAL   Final   Special Requests BOTTLES DRAWN AEROBIC AND ANAEROBIC 3CC   Final   Culture  Setup Time 07/04/2012 04:42   Final   Culture NO GROWTH 5 DAYS   Final   Report Status 07/10/2012 FINAL   Final  URINE CULTURE     Status: None   Collection Time    07/03/12  9:12 PM      Result Value Range Status   Specimen Description URINE, CATHETERIZED   Final   Special Requests NONE   Final   Culture  Setup Time 07/04/2012 04:07   Final   Colony Count >=100,000 COLONIES/ML   Final   Culture     Final   Value: ESCHERICHIA COLI     Note: Two isolates with different morphologies were identified as the same organism.The most resistant organism was reported.   Report Status 07/06/2012 FINAL   Final   Organism ID, Bacteria ESCHERICHIA COLI   Final  CULTURE, BLOOD (ROUTINE X 2)     Status: None   Collection Time    07/03/12 10:30 PM      Result Value Range Status   Specimen Description BLOOD LEFT HAND   Final   Special Requests BOTTLES DRAWN AEROBIC AND ANAEROBIC 3CC   Final   Culture  Setup Time 07/04/2012 01:32   Final   Culture NO GROWTH 5 DAYS   Final   Report Status 07/10/2012 FINAL    Final  MRSA PCR SCREENING     Status: None   Collection Time    07/04/12  2:44 AM      Result Value Range Status   MRSA by PCR NEGATIVE  NEGATIVE Final   Comment:            The GeneXpert MRSA Assay (FDA     approved for NASAL specimens     only), is one component of a     comprehensive MRSA colonization     surveillance program. It is  not     intended to diagnose MRSA     infection nor to guide or     monitor treatment for     MRSA infections.  CLOSTRIDIUM DIFFICILE BY PCR     Status: None   Collection Time    07/06/12 11:22 AM      Result Value Range Status   C difficile by pcr NEGATIVE  NEGATIVE Final     Studies: No results found.  Scheduled Meds: . antiseptic oral rinse  15 mL Mouth Rinse q12n4p  . aspirin EC  81 mg Oral Daily  . cefTRIAXone (ROCEPHIN)  IV  1 g Intravenous Daily  . chlorhexidine  15 mL Mouth Rinse BID  . clindamycin  300 mg Oral Q8H  . donepezil  10 mg Oral QHS  . enoxaparin (LOVENOX) injection  40 mg Subcutaneous Q24H  . feeding supplement  1 Container Oral BID BM  . insulin aspart  0-9 Units Subcutaneous TID WC  . QUEtiapine  25 mg Oral Daily  . sertraline  150 mg Oral Daily  . sodium chloride  1,000 mL Intravenous Once  . sodium chloride  3 mL Intravenous Q12H  . traZODone  25 mg Oral Daily   Continuous Infusions: . dextrose 5 % and 0.45% NaCl 75 mL/hr at 07/11/12 1053    Principal Problem:   Acute renal failure Active Problems:   DM   Alzheimer's disease   Anemia in chronic kidney disease(285.21)   UTI (lower urinary tract infection)   Cellulitis and abscess of leg    Time spent: 25 minutes.     Zalea Pete  [facility] Pager [phone]. If 7PM-7AM, please contact night-coverage at [email or web address], password [facility] 07/11/2012, 1:49 PM  LOS: 8 days

## 2012-07-11 NOTE — Progress Notes (Signed)
INITIAL NUTRITION ASSESSMENT  DOCUMENTATION CODES Per approved criteria  -Obesity Unspecified   INTERVENTION: - Resource Breeze BID (per SLP pt refuses to drink Ensure) - Recommend palliative care consult to determine pt's goals for nutrition (comfort feeds with aspiration risk versus PEG) - Will continue to monitor   NUTRITION DIAGNOSIS: Swallowing difficulty related to cognitive deficit as evidenced by SLP evaluation.   Goal: Pt able to safely consume >90% of meals/supplements  Monitor:  Weights, labs, intake, palliative care discussion, ? PEG  Reason for Assessment: Nutrition risk   67 y.o. male  Admitting Dx: Acute renal failure  ASSESSMENT: Pt discussed during multidisciplinary rounds.  - S/p SLP bedside evaluation 6/25 and MBS 6/26 with notes that pt at severe aspiration risk and NPO was recommended.  - GI saw pt 6/27 with recommendations for PEG tube due to pt's aspiration risk.    - S/p MBS 6/28 with notes that pt had moderate dysphagia with recommendations for thin liquid, nectar thick liquid, or NPO - Pt started on full liquid (thin liquid) diet 6/28. Noted pt ate 75% of dinner last night - Had discussion with SLP who recommended against PEG due to pt would still have aspiration risk and noted that pt enjoys eating. SLP recommended palliative care consult. Discussed palliative care consult with MD who said she had tried to recommend this to pt's sister yesterday however sister was not open to this discussion. Discussed SLP recommendations with MD who stated she would try to discuss palliative care option again with sister today via telephone call.   Met with pt who states he was eating well and had a good appetite PTA. He states that he has dentures but he doesn't like to wear them (this was confirmed by conversation with nursing home RN). He says that he has lost 25-30 pounds unintentionally but does not know the time frame he has lost this weight. Pt states he cannot  chew meat because it is too difficult for him to chew.   Discussed pt's diet history with Thomes Lolling. She reports pt eats excellent, 100% of meals/supplements, and was on a chopped meats/thin liquid diet at the facility. She states she did not observe pt with any difficulty swallowing and never saw him have a choking episode or cough after meals. She reports pt was able to feed himself. She is unsure of weight history.   Nutrition Focused Physical Exam:  Subcutaneous Fat:  Orbital Region: WNL Upper Arm Region: WNL Thoracic and Lumbar Region: NA  Muscle:  Temple Region: WNL Clavicle Bone Region: WNL Clavicle and Acromion Bone Region: WNL Scapular Bone Region: NA Dorsal Hand: WNL Patellar Region: NA Anterior Thigh Region: NA Posterior Calf Region: NA  Edema: Non-pitting RLE, LLE edema   Height: Ht Readings from Last 1 Encounters:  07/04/12 5\' 10"  (1.778 m)    Weight: Wt Readings from Last 1 Encounters:  07/04/12 222 lb 3.6 oz (100.8 kg)    Ideal Body Weight: 166 lb   % Ideal Body Weight: 134%  Wt Readings from Last 10 Encounters:  07/04/12 222 lb 3.6 oz (100.8 kg)  02/23/10 217 lb 8 oz (98.657 kg)  04/23/09 204 lb (92.534 kg)  11/18/08 211 lb 8 oz (95.936 kg)    Usual Body Weight: 217 lb in 2012  % Usual Body Weight: 102%  BMI:  Body mass index is 31.89 kg/(m^2). Class I obesity  Estimated Nutritional Needs: Kcal: 1900-2100 Protein: 75-90g Fluid: 1.9-2.1L/day  Skin: Diabetic ulcer on left toe,  left lower abdominal incision  Diet Order: Full Liquid  EDUCATION NEEDS: -No education needs identified at this time   Intake/Output Summary (Last 24 hours) at 07/11/12 0922 Last data filed at 07/11/12 0500  Gross per 24 hour  Intake 2598.75 ml  Output   2100 ml  Net 498.75 ml    Last BM: 7/1  Labs:   Recent Labs Lab 07/09/12 1026 07/10/12 0440 07/11/12 0530  NA 144 142 142  K 3.2* 3.4* 3.6  CL 112 111 111  CO2 19 20 22   BUN 33* 31* 28*   CREATININE 2.02* 1.89* 1.73*  CALCIUM 9.0 9.1 9.1  GLUCOSE 126* 138* 113*    CBG (last 3)   Recent Labs  07/11/12 0005 07/11/12 0437 07/11/12 0742  GLUCAP 107* 95 105*    Scheduled Meds: . antiseptic oral rinse  15 mL Mouth Rinse q12n4p  . aspirin EC  81 mg Oral Daily  . cefTRIAXone (ROCEPHIN)  IV  1 g Intravenous Daily  . chlorhexidine  15 mL Mouth Rinse BID  . clindamycin  300 mg Oral Q8H  . donepezil  10 mg Oral QHS  . enoxaparin (LOVENOX) injection  40 mg Subcutaneous Q24H  . insulin aspart  0-9 Units Subcutaneous TID WC  . QUEtiapine  25 mg Oral Daily  . sertraline  150 mg Oral Daily  . sodium chloride  1,000 mL Intravenous Once  . sodium chloride  3 mL Intravenous Q12H  . traZODone  25 mg Oral Daily    Continuous Infusions: . dextrose 5 % and 0.45% NaCl 75 mL/hr at 07/10/12 0815    Past Medical History  Diagnosis Date  . Hyponatremia   . Hypokalemia   . Anemia   . Diabetes mellitus without complication   . Hypertension   . UTI (lower urinary tract infection)   . Delusional disorder   . HOH (hard of hearing)   . Hyperlipidemia   . CHF (congestive heart failure)   . Renal insufficiency   . Stroke   . Dementia   . Depression   . Dysphagia   . Falls frequently     Past Surgical History  Procedure Laterality Date  . Colon surgery    . Colectomy    . Ileostomy    . Peg tube removal    . Cardiac catheterization    . Cholecystectomy    . Abdominal adhesion surgery       Levon Hedger MS, RD, LDN [phone] Pager [phone] After Hours Pager

## 2012-07-11 NOTE — Progress Notes (Signed)
CSW spoke with patient's sister. She confirms patient is a resident of [facility]. CSW spoke with [facility] to give update. CSW continues to follow.  [name] C. Jorell Agne MSW, LCSW [phone]

## 2012-07-12 DIAGNOSIS — N390 Urinary tract infection, site not specified: Secondary | ICD-10-CM

## 2012-07-12 DIAGNOSIS — E1129 Type 2 diabetes mellitus with other diabetic kidney complication: Secondary | ICD-10-CM

## 2012-07-12 DIAGNOSIS — R1319 Other dysphagia: Secondary | ICD-10-CM

## 2012-07-12 DIAGNOSIS — Z515 Encounter for palliative care: Secondary | ICD-10-CM

## 2012-07-12 LAB — BASIC METABOLIC PANEL
CO2: 21 mEq/L (ref 19–32)
Calcium: 9 mg/dL (ref 8.4–10.5)
Creatinine, Ser: 1.52 mg/dL — ABNORMAL HIGH (ref 0.50–1.35)
GFR calc non Af Amer: 46 mL/min — ABNORMAL LOW (ref >90)
Glucose, Bld: 110 mg/dL — ABNORMAL HIGH (ref 70–99)
Sodium: 138 mEq/L (ref 135–145)

## 2012-07-12 LAB — CBC
Hemoglobin: 8.8 g/dL — ABNORMAL LOW (ref 13.0–17.0)
MCH: 26.2 pg (ref 26.0–34.0)
MCV: 77.7 fL — ABNORMAL LOW (ref 78.0–100.0)
RBC: 3.36 MIL/uL — ABNORMAL LOW (ref 4.22–5.81)
WBC: 10.4 10*3/uL (ref 4.0–10.5)

## 2012-07-12 LAB — GLUCOSE, CAPILLARY
Glucose-Capillary: 103 mg/dL — ABNORMAL HIGH (ref 70–99)
Glucose-Capillary: 97 mg/dL (ref 70–99)

## 2012-07-12 MED ORDER — ENOXAPARIN SODIUM 40 MG/0.4ML ~~LOC~~ SOLN
40.0000 mg | SUBCUTANEOUS | Status: DC
  Administered 2012-07-13: 40 mg via SUBCUTANEOUS
  Filled 2012-07-12 (×2): qty 0.4

## 2012-07-12 NOTE — Progress Notes (Addendum)
[facility] PROGRESS NOTE  [name] ZOX:[id number] DOB: 03-20-1945 DOA: 07/03/2012 PCP: Terald Sleeper, MD  Assessment/Plan:  1-Cellulitis of LLE, ulcer big toe.  -Improving.  -continue  clindamycin day 9/10. -dopplers negative for  DVT   2. Leukocytosis  -trending down today at 9.  -Others possibility aspiration PNA. Clindamycin should cover for aspiration PNA. Chest x ray 6-28  with infiltrates, and slight pulmonary congestion -adjust IVF's -Urine grew E coli. Continue with ceftriaxone day 6/10.   3. ARF on CKD: baseline creatinine 1.4  -Component of rhabdomyolysis, dehydration and continue use of nephrotoxic agents. -Continue with IV fluids, but will adjust rate. Continue holding lasix  -renal US: No acute urinary pathology. Mild cortical thinning bilaterally. -good urine out put. Cr trending down. Today at 1.5  4. Hyperkalemia  -Resolved.  -Received kayexalate and insulin.  -will monitor  5. Dementia: moderately advanced  -Patient was able to engage in a conversation today.  -continue aricept, seroquel   6. DM  -continue levemir and SSI  -adjustment to be done after tube feeding initiated; further titration will be required as an outpatient.  7. DVT proph: lovenox  8-E. Coli UTI, Urine grew 100-K E coli sensitive to  ceftriaxone. Continue with ceftriaxone day 6/10.  9-Anemia: monitor for bleed. Hb stable.   10-Dysphagia: Patient with severe oral  pharyngeal dysphagia. Per meeting plan is for full code and everything done. IR consulted for PEG tube placement.  Code Status: Full Code.  Family Communication: sister.  Disposition Plan: Back to SNF when stable.    Consultants:  Wound care nurse.   Procedures:  Doppler LE negative for DVT.   Antibiotics:  Clindamycin 6-24   Ceftriaxone 6-26  HPI/Subjective: Patient denies CP or SOB; AAOX2; per POC meeting patient remains full code and the plan is for everything to be done including  artificial feeding.  Objective: Filed Vitals:   07/11/12 2122 07/11/12 2245 07/12/12 0630 07/12/12 1357  BP: 154/56  136/57 150/64  Pulse: 63 64 63 58  Temp: 98.2 F (36.8 C)  98 F (36.7 C) 98 F (36.7 C)  TempSrc: Oral  Oral Oral  Resp: 20  24 20   Height:      Weight:      SpO2: 100%  100% 100%    Intake/Output Summary (Last 24 hours) at 07/12/12 1631 Last data filed at 07/12/12 1354  Gross per 24 hour  Intake 2023.75 ml  Output   2775 ml  Net -751.25 ml   Filed Weights   07/04/12 0200  Weight: 100.8 kg (222 lb 3.6 oz)    Exam:   General:  No distress. Afebrile; able to follow simple commands; hard of hearing. AAOX2  Cardiovascular:S 1, S 2 RRR  Respiratory: Bilateral ronchus.   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  Musculoskeletal: left LE with decreases erythema, edema. Left big toe with ulcer covered with clean dressings.  Data Reviewed: Basic Metabolic Panel:  Recent Labs Lab 07/08/12 0425 07/09/12 1026 07/10/12 0440 07/11/12 0530 07/12/12 0500  NA 146* 144 142 142 138  K 3.7 3.2* 3.4* 3.6 3.4*  CL 116* 112 111 111 106  CO2 20 19 20 22 21   GLUCOSE 121* 126* 138* 113* 110*  BUN 36* 33* 31* 28* 24*  CREATININE 2.32* 2.02* 1.89* 1.73* 1.52*  CALCIUM 8.6 9.0 9.1 9.1 9.0   CBC:  Recent Labs Lab 07/08/12 0425 07/09/12 1026 07/10/12 0440 07/11/12 0530 07/12/12 0500  WBC 10.6* 9.8 8.4 9.8 10.4  HGB 8.1* 8.5* 8.1* 8.4* 8.8*  HCT 23.7* 25.4* 24.4* 24.9* 26.1*  MCV 76.9* 77.2* 77.2* 77.3* 77.7*  PLT 169 180 187 193 228   Cardiac Enzymes:  Recent Labs Lab 07/06/12 0525  CKTOTAL 229  CKMB 5.8*   CBG:  Recent Labs Lab 07/11/12 1148 07/11/12 1637 07/11/12 2119 07/12/12 0801 07/12/12 1154  GLUCAP 130* 84 101* 103* 110*    Recent Results (from the past 240 hour(s))  CULTURE, BLOOD (ROUTINE X 2)     Status: None   Collection Time    07/03/12  9:00 PM      Result Value Range  Status   Specimen Description BLOOD LEFT ANTECUBITAL   Final   Special Requests BOTTLES DRAWN AEROBIC AND ANAEROBIC 3CC   Final   Culture  Setup Time 07/04/2012 04:42   Final   Culture NO GROWTH 5 DAYS   Final   Report Status 07/10/2012 FINAL   Final  URINE CULTURE     Status: None   Collection Time    07/03/12  9:12 PM      Result Value Range Status   Specimen Description URINE, CATHETERIZED   Final   Special Requests NONE   Final   Culture  Setup Time 07/04/2012 04:07   Final   Colony Count >=100,000 COLONIES/ML   Final   Culture     Final   Value: ESCHERICHIA COLI     Note: Two isolates with different morphologies were identified as the same organism.The most resistant organism was reported.   Report Status 07/06/2012 FINAL   Final   Organism ID, Bacteria ESCHERICHIA COLI   Final  CULTURE, BLOOD (ROUTINE X 2)     Status: None   Collection Time    07/03/12 10:30 PM      Result Value Range Status   Specimen Description BLOOD LEFT HAND   Final   Special Requests BOTTLES DRAWN AEROBIC AND ANAEROBIC 3CC   Final   Culture  Setup Time 07/04/2012 01:32   Final   Culture NO GROWTH 5 DAYS   Final   Report Status 07/10/2012 FINAL   Final  MRSA PCR SCREENING     Status: None   Collection Time    07/04/12  2:44 AM      Result Value Range Status   MRSA by PCR NEGATIVE  NEGATIVE Final   Comment:            The GeneXpert MRSA Assay (FDA     approved for NASAL specimens     only), is one component of a     comprehensive MRSA colonization     surveillance program. It is not     intended to diagnose MRSA     infection nor to guide or     monitor treatment for     MRSA infections.  CLOSTRIDIUM DIFFICILE BY PCR     Status: None   Collection Time    07/06/12 11:22 AM      Result Value Range Status   C difficile by pcr NEGATIVE  NEGATIVE Final     Studies: No results found.  Scheduled Meds: . antiseptic oral rinse  15 mL Mouth Rinse q12n4p  . aspirin EC  81 mg Oral Daily  .  cefTRIAXone (ROCEPHIN)  IV  1 g Intravenous Daily  . chlorhexidine  15 mL Mouth Rinse BID  . clindamycin  300 mg Oral Q8H  . donepezil  10 mg  Oral QHS  . enoxaparin (LOVENOX) injection  40 mg Subcutaneous Q24H  . feeding supplement  1 Container Oral BID BM  . insulin aspart  0-9 Units Subcutaneous TID WC  . QUEtiapine  25 mg Oral Daily  . sertraline  150 mg Oral Daily  . sodium chloride  1,000 mL Intravenous Once  . sodium chloride  3 mL Intravenous Q12H  . traZODone  25 mg Oral Daily   Continuous Infusions: . dextrose 5 % and 0.45% NaCl 75 mL/hr at 07/12/12 1509    Principal Problem:   Acute renal failure Active Problems:   DM   Alzheimer's disease   Anemia in chronic kidney disease(285.21)   UTI (lower urinary tract infection)   Cellulitis and abscess of leg   Other dysphagia   Palliative care encounter    Time spent: 25 minutes.     [facility]  [facility] Pager [phone]. If 7PM-7AM, please contact night-coverage at [email or web address], password [facility] 07/12/2012, 4:31 PM  LOS: 9 days

## 2012-07-12 NOTE — Consult Note (Signed)
Patient [name]      DOB: 1945-06-23      NWG:[id number]     Consult Note from the Palliative Medicine Team at [facility]    Consult Requested by: Dr Sunnie Nielsen     PCP: Terald Sleeper, MD Reason for Consultation:Clarification of GOC and options     Phone [phone]  Assessment of patients Current state: 67 yo male with PMH significant for DM, dementia, CVA, CAD, Ogilvie, colon cancer (s/p colectomy and ileostomy), patient has lived in SNF for past 4 years.  Family reports patietn is very HOH and this greatly impacts his ability to follow commands, was ambulatory prior to admission, continent, had full conversations and "does not have dementia" Presently weakened with severe dysphagia and progressing slowly with PT/OT Meeting today to determine GOC   Consult is for review of medical treatment options, clarification of goals of care and end of life issues, disposition and options, and symptom recommendation.  This NP Lorinda Creed reviewed medical records, received report from team, assessed the patient and then meet at the patient's bedside along with his sister [place] # [phone], brother Lauris Chroman and sister Ramond Marrow to discuss diagnosis prognosis, GOC, EOL wishes disposition and options.   A detailed discussion was had today regarding advanced directives.  Concepts specific to code status, artifical feeding and hydration, continued IV antibiotics and rehospitalization was had.  The difference between a aggressive medical intervention path  and a palliative comfort care path for this patient at this time was had.  Values and goals of care important to patient and family were attempted to be elicited.  Concept of Hospice and Palliative Care were discussed   Hard Choices booklet left for review. Family encouraged to call with questions or concerns.  PMT will continue to support holistically.   Goals of Care: 1.  Code Status: Full code   2. Scope of  Treatment:  Family request all available medical interventions to prolong life at this time, including feeding tube.     4. Disposition: When medically stable dc back to SNF with physical therapy, speech therapy and palliative care services.  Family is hopeful for return to baseline    3. Symptom Management:   1.  Dysphagia: family request for artificial feeding source (PEG) to be considered. (risks and benefits discussed in detail)  4. Psychosocial:  Emotional  Support offered to patient and family.  Detailed education offered  5. Spiritual: Strong [employer] support, son is a Optician, dispensing.     Patient Documents Completed or Given: Document Given Completed  Advanced Directives Pkt    MOST    DNR    Gone from My Sight    Hard Choices  yes   67 yo male with PMH significant for DM, dementia, CVA, CAD, Ogilvie, colon cancer (s/p colectomy and ileostomy), patient has lived in SNF for past 4 years.  Family reports patietn is very HOH and this greatly impacts his ability to follow commands, was ambulatory prior to admission, continent, had full conversations and "does not have dementia" Presently weakened with severe dysphagia and progressing slowly with PT/OT Meeting today to determine GOC  Brief HPI:    ROS: unable to illicit due to patient inability to communicate    PMH:  Past Medical History  Diagnosis Date  . Hyponatremia   . Hypokalemia   . Anemia   . Diabetes mellitus without complication   . Hypertension   . UTI (lower urinary tract infection)   .  Delusional disorder   . HOH (hard of hearing)   . Hyperlipidemia   . CHF (congestive heart failure)   . Renal insufficiency   . Stroke   . Dementia   . Depression   . Dysphagia   . Falls frequently      PSH: Past Surgical History  Procedure Laterality Date  . Colon surgery    . Colectomy    . Ileostomy    . Peg tube removal    . Cardiac catheterization    . Cholecystectomy    . Abdominal adhesion  surgery     I have reviewed the FH and SH and  If appropriate update it with new information. No Known Allergies Scheduled Meds: . antiseptic oral rinse  15 mL Mouth Rinse q12n4p  . aspirin EC  81 mg Oral Daily  . cefTRIAXone (ROCEPHIN)  IV  1 g Intravenous Daily  . chlorhexidine  15 mL Mouth Rinse BID  . clindamycin  300 mg Oral Q8H  . donepezil  10 mg Oral QHS  . enoxaparin (LOVENOX) injection  40 mg Subcutaneous Q24H  . feeding supplement  1 Container Oral BID BM  . insulin aspart  0-9 Units Subcutaneous TID WC  . QUEtiapine  25 mg Oral Daily  . sertraline  150 mg Oral Daily  . sodium chloride  1,000 mL Intravenous Once  . sodium chloride  3 mL Intravenous Q12H  . traZODone  25 mg Oral Daily   Continuous Infusions: . dextrose 5 % and 0.45% NaCl 1,000 mL (07/12/12 0101)   PRN Meds:.    BP 136/57  Pulse 63  Temp(Src) 98 F (36.7 C) (Oral)  Resp 24  Ht 5\' 10"  (1.778 m)  Wt 100.8 kg (222 lb 3.6 oz)  BMI 31.89 kg/m2  SpO2 100%   PPS: 30 % at best   Intake/Output Summary (Last 24 hours) at 07/12/12 1150 Last data filed at 07/12/12 1118  Gross per 24 hour  Intake 2893.75 ml  Output   2900 ml  Net  -6.25 ml     Physical Exam:  General: chronically ill appearing, NAD HEENT:  Normocephalic, mm no exudate Chest:   CTA CVS: RRR Abdomen: soft NT +BS Ext: LLE with + 2 edema and redden ness , noted toe dressing Neuro: difficult to evaluate, unable to communicate with patient even when speaking loudly, I am not certain this altered cognition is totally related to his hearing loss   Labs: CBC    Component Value Date/Time   WBC 10.4 07/12/2012 0500   WBC 4.5 04/09/2009 0912   RBC 3.36* 07/12/2012 0500   HGB 8.8* 07/12/2012 0500   HGB 11.0* 04/09/2009 0912   HCT 26.1* 07/12/2012 0500   HCT 34.0* 04/09/2009 0912   PLT 228 07/12/2012 0500   PLT 219 04/09/2009 0912   MCV 77.7* 07/12/2012 0500   MCV 81* 04/09/2009 0912   MCH 26.2 07/12/2012 0500   MCH 26.0* 04/09/2009 0912   MCHC  33.7 07/12/2012 0500   MCHC 32.3 04/09/2009 0912   RDW 14.4 07/12/2012 0500   RDW 17.1* 04/09/2009 0912   LYMPHSABS 0.3* 07/03/2012 2100   LYMPHSABS 0.8* 04/09/2009 0912   MONOABS 1.3* 07/03/2012 2100   EOSABS 0.0 07/03/2012 2100   EOSABS 0.1 04/09/2009 0912   BASOSABS 0.0 07/03/2012 2100   BASOSABS 0.0 04/09/2009 0912    BMET    Component Value Date/Time   NA 138 07/12/2012 0500   K 3.4* 07/12/2012 0500   CL  106 07/12/2012 0500   CO2 21 07/12/2012 0500   GLUCOSE 110* 07/12/2012 0500   BUN 24* 07/12/2012 0500   CREATININE 1.52* 07/12/2012 0500   CALCIUM 9.0 07/12/2012 0500   GFRNONAA 46* 07/12/2012 0500   GFRAA 53* 07/12/2012 0500    CMP     Component Value Date/Time   NA 138 07/12/2012 0500   K 3.4* 07/12/2012 0500   CL 106 07/12/2012 0500   CO2 21 07/12/2012 0500   GLUCOSE 110* 07/12/2012 0500   BUN 24* 07/12/2012 0500   CREATININE 1.52* 07/12/2012 0500   CALCIUM 9.0 07/12/2012 0500   PROT 6.9 04/09/2009 0912   ALBUMIN 3.8 04/09/2009 0912   AST 18 04/09/2009 0912   ALT 13 04/09/2009 0912   ALKPHOS 63 04/09/2009 0912   BILITOT 0.4 04/09/2009 0912   GFRNONAA 46* 07/12/2012 0500   GFRAA 53* 07/12/2012 0500      Time In Time Out Total Time Spent with Patient Total Overall Time  1145 1315 80 min 90 min    Greater than 50%  of this time was spent counseling and coordinating care related to the above assessment and plan.  Lorinda Creed NP  Palliative Medicine Team Team Phone # [phone] Pager [phone]  Discussed with Dr Gwenlyn Perking

## 2012-07-12 NOTE — Progress Notes (Signed)
Patient ID: [name], male   DOB: 1945-06-26, 67 y.o.   MRN: [id number] Request received for placement of a percutaneous gastrostomy tube in pt with hx of severe dysphagia, prior CVA, FTT. 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 Pt had G tube placed in 2009 without complication. Additional hx as below. Recent imaging studies were reviewed by Dr. Archer Asa. Exam: pt awake but confused/HOH; chest- dim BS bases; heart- RRR; abd- soft,+BS, RLQ ostomy intact, LLQ mucous fistula with small amt beige drainage but no erythema;ext- edema bilat ,> on left with chronic venous stasis changes, left foot bandaged.     Filed Vitals:   07/11/12 2122 07/11/12 2245 07/12/12 0630 07/12/12 1357  BP: 154/56  136/57 150/64  Pulse: 63 64 63 58  Temp: 98.2 F (36.8 C)  98 F (36.7 C) 98 F (36.7 C)  TempSrc: Oral  Oral Oral  Resp: 20  24 20   Height:      Weight:      SpO2: 100%  100% 100%   Past Medical History  Diagnosis Date  . Hyponatremia   . Hypokalemia   . Anemia   . Diabetes mellitus without complication   . Hypertension   . UTI (lower urinary tract infection)   . Delusional disorder   . HOH (hard of hearing)   . Hyperlipidemia   . CHF (congestive heart failure)   . Renal insufficiency   . Stroke   . Dementia   . Depression   . Dysphagia   . Falls frequently    Past Surgical History  Procedure Laterality Date  . Colon surgery    . Colectomy    . Ileostomy    . Peg tube removal    . Cardiac catheterization    . Cholecystectomy    . Abdominal adhesion surgery     Dg Chest 2 View  07/07/2012   *RADIOLOGY REPORT*  Clinical Data: Leukocytosis, evaluate for aspiration pneumonia  CHEST - 2 VIEW  Comparison: 07/03/2012; 09/13/2008; chest CT - 06/05/2008  Findings:  Grossly unchanged enlarged cardiac silhouette and mediastinal contours with slight differences attributable to the  decreased lung volumes.  Pulmonary vasculature is indistinct with cephalization of flow.  Worsening bibasilar heterogeneous / consolidative opacities, left greater than right.  There are at least small bilateral effusions.  No definite pneumothorax.  Unchanged bones.  IMPRESSION: Hypoventilated examination with findings most suggestive of pulmonary edema with small bilateral effusions and bibasilar opacities, left greater than right, atelectasis versus infiltrate. A follow-up chest radiograph in 4 to 6 weeks after treatment is recommended to ensure resolution.   Original Report Authenticated By: Tacey Ruiz, MD   Dg Chest 2 View  07/03/2012   *RADIOLOGY REPORT*  Clinical Data: Fever.  CHEST - 2 VIEW  Comparison: 09/13/2008  Findings: Minimal bibasilar opacities, likely atelectasis.  Heart is normal size.  No effusions.  Mediastinal contours within normal limits.  No acute bony abnormality.  IMPRESSION: Bibasilar atelectasis.   Original Report Authenticated By: Charlett Nose, M.D.   US Renal  07/04/2012   *RADIOLOGY REPORT*  Clinical Data: Acute renal failure  RENAL/URINARY TRACT ULTRASOUND COMPLETE  Comparison:  09/06/2007  Findings:  Right Kidney:  11.5 cm in length.  Normal cortical echogenicity. No hydronephrosis or mass.  Mild cortical thinning.  Left Kidney:  11.0 cm in length.  No hydronephrosis or mass. Normal cortical echogenicity.  Mild cortical thinning.  Bladder:  Within normal limits.  IMPRESSION: No acute urinary pathology.  Mild cortical thinning bilaterally.   Original Report Authenticated By: Jolaine Click, M.D.   Dg Swallowing Func-speech Pathology  07/08/2012   Chales Abrahams, CCC-SLP     07/08/2012  5:31 PM Objective Swallowing Evaluation: Modified Barium Swallowing Study   Patient Details  Name: [name] MRN: [id number] Date of Birth: 1945/02/06  Today's Date: 07/08/2012 Time: 1610-1650 SLP Time Calculation (min): 40 min  Past Medical History:  Past Medical History  Diagnosis Date   . Hyponatremia   . Hypokalemia   . Anemia   . Diabetes mellitus without complication   . Hypertension   . UTI (lower urinary tract infection)   . Delusional disorder   . HOH (hard of hearing)   . Hyperlipidemia   . CHF (congestive heart failure)   . Renal insufficiency   . Stroke   . Dementia   . Depression   . Dysphagia   . Falls frequently    Past Surgical History:  Past Surgical History  Procedure Laterality Date  . Colon surgery    . Colectomy    . Ileostomy    . Peg tube removal    . Cardiac catheterization    . Cholecystectomy    . Abdominal adhesion surgery     HPI:  67 yo male adm to [facility] from [facility] with cellulitis of left  lower extremity.  Pt with leukocytosis, hyperkalemia, dementia,  DM.   Pt passed an RN stroke swallow screen although he has h/o  severe dysphagia and has undergone multiple MBS studies.  Bedside  swallow evaluation and MBS completed 6/26.  MBS recommended NPO  secondary to severe oropharyngeal dysphagia and silent  aspiration.  Pt for possible PEG placement per primary team. Pt  demonstrating improved mental status today and MD requested  repeat evaluation to assess appropriateness for po intake.  After  seeing pt at bedside, MBS repeated to allow instrumental  evaluation.    Dg Chest 2 View  07/07/2012   *RADIOLOGY REPORT*  Clinical Data: Leukocytosis,  evaluate for aspiration pneumonia  CHEST - 2 VIEW  Comparison:  07/03/2012; 09/13/2008; chest CT - 06/05/2008  Findings:  Grossly  unchanged enlarged cardiac silhouette and mediastinal contours  with slight differences attributable to the decreased lung  volumes.  Pulmonary vasculature is indistinct with cephalization  of flow.  Worsening bibasilar heterogeneous / consolidative  opacities, left greater than right.  There are at least small  bilateral effusions.  No definite pneumothorax.  Unchanged bones.   IMPRESSION: Hypoventilated examination with findings most  suggestive of pulmonary edema with small bilateral effusions and   bibasilar opacities, left greater than right, atelectasis versus  infiltrate. A follow-up chest radiograph in 4 to 6 weeks after  treatment is recommended to ensure resolution.   Original Report  Authenticated By: Tacey Ruiz, MD       Assessment / Plan / Recommendation Clinical Impression  Dysphagia Diagnosis: Moderate oral phase dysphagia;Moderate  pharyngeal phase dysphagia  Clinical impression: Pt with improved swallow function since MBS  2 days prior and he did not aspirate with any consistency tested.   Pt does have delayed oral transiting, lingual pumping due to his  cognitive deficit and weakness.  Premature spillage of boluses  into pharynx noted with delayed reflexive pharyngeal swallow.   Tongue base and pharyngeal stasis noted across consistencies and  mixed with secretion without pt awareness, nor attempts to dry  swallow  to clear.  Stasis was much worse with pudding WITHOUT pt  sensation.  Cued dry swallows conducted approx 25% of  opportunities and were helpful to decrease stasis but not fully  clear.   Mild amount of laryngeal penetration noted - even with  pt taking sequential cup sips of thin barium (feeding himself).   Pt's inability to follow commands increases his aspiration risk,  but his ability to self feed is helpful.    Pt remains an aspiration risk with po, however at bedside his  swallow is triggered faster than during MBS.  SLP suspects  icecream provided better sensory input and pt consumption of  barium impacted testing validity.    Options to pursue po diet (full liquid - no grits, pudding) or  continue NPO.   Even if pt were to have PEG, he will still likely  aspirate secretions and asp pna is a risk for pt's with PEG. Pt  intake likely would be adequate when he is fully alert and  feeding himself based on clinical observation today.    For QOL issues *pt enjoyed eating his icecream and consuming  juice* pursuing intake with aspiration mitigation may be feasible  option.  Defer diet  decision to MD due to multiple factors and  borderline test result.   Suspect pt with baseline deficits and episodic aspiration that he  manages when well.      Treatment Recommendation  Therapy as outlined in treatment plan below    Diet Recommendation Thin liquid;Nectar-thick liquid;NPO (FULL  LIQUID (no pudding or grits)  OR  NPO)   Liquid Administration via: Cup Medication Administration: Crushed with puree (crush with  icecream, follow with liquid) Supervision: Patient able to self feed;Full supervision/cueing  for compensatory strategies Compensations: Slow rate;Small sips/bites;Check for pocketing  (try to get pt to dry swallow) Postural Changes and/or Swallow Maneuvers: Seated upright 90  degrees;Upright 30-60 min after meal    Other  Recommendations Oral Care Recommendations: Oral care BID   Follow Up Recommendations  Skilled Nursing facility    Frequency and Duration min 2x/week  2 weeks   Pertinent Vitals/Pain afebrile    SLP Swallow Goals Patient will utilize recommended strategies during swallow to  increase swallowing safety with: Maximal cueing Swallow Study Goal #3 - Progress: Not met (pt cognition prevents  following directions) Goal #4: Pt's family will determine if decide for pt to have po  with known risks with compensations to decrease aspiration risk.    Swallow Study Goal #4 - Progress: Not met (no family present)   General HPI: 67 yo male adm to [facility] from [place] with  cellulitis of left lower extremity.  Pt with leukocytosis,  hyperkalemia, dementia, DM.   Pt passed an RN stroke swallow  screen although he has h/o severe dysphagia and has undergone  multiple MBS studies.  Bedside swallow evaluation and MBS  completed 6/26.  MBS recommended NPO secondary to severe  oropharyngeal dysphagia and silent aspiration.  Pt for possible  PEG placement per primary team. Pt demonstrating improved mental  status today and MD requested repeat evaluation to assess  appropriateness for po intake.  After  seeing pt at bedside, MBS  repeated to allow instrumental evaluation.  Type of Study: Modified Barium Swallowing Study Reason for Referral: Objectively evaluate swallowing function Previous Swallow Assessment: MBS two days prior  Diet Prior to this Study: NPO Temperature Spikes Noted: No Respiratory Status: Room air History of Recent Intubation: No Behavior/Cognition: Alert;Decreased sustained attention;Hard  of  hearing (inconsistent ability to follow commands) Oral Cavity - Dentition: Edentulous Oral Motor / Sensory Function: Impaired - see Bedside swallow  eval Self-Feeding Abilities: Able to feed self Patient Positioning: Upright in chair Baseline Vocal Quality: Low vocal intensity;Breathy Volitional Cough: Cognitively unable to elicit Volitional Swallow: Able to elicit Anatomy: Within functional limits Pharyngeal Secretions: Standing secretions in (comment) (pharynx  mixed with barium)    Reason for Referral Objectively evaluate swallowing function   Oral Phase Oral - Nectar Oral - Nectar Teaspoon: Weak lingual manipulation;Delayed oral  transit;Lingual pumping;Holding of bolus Oral - Nectar Cup: Delayed oral transit;Weak lingual  manipulation;Lingual pumping;Holding of bolus Oral - Thin Oral - Thin Teaspoon: Lingual pumping;Holding of bolus;Weak  lingual manipulation;Delayed oral transit Oral - Thin Cup: Delayed oral transit;Weak lingual  manipulation;Lingual pumping;Holding of bolus Oral - Solids Oral - Puree: Delayed oral transit;Lingual pumping;Weak lingual  manipulation   Pharyngeal Phase Pharyngeal - Nectar Pharyngeal - Nectar Teaspoon: Delayed swallow  initiation;Premature spillage to valleculae;Reduced laryngeal  elevation;Reduced airway/laryngeal closure;Reduced tongue base  retraction;Pharyngeal residue - valleculae;Pharyngeal residue -  pyriform sinuses;Lateral channel residue Pharyngeal - Nectar Cup: Premature spillage to valleculae;Delayed  swallow initiation;Reduced epiglottic inversion;Reduced  anterior  laryngeal mobility;Reduced pharyngeal peristalsis;Reduced tongue  base retraction;Pharyngeal residue - valleculae;Pharyngeal  residue - pyriform sinuses;Penetration/Aspiration during  swallow;Lateral channel residue;Reduced laryngeal  elevation;Reduced airway/laryngeal closure Penetration/Aspiration details (nectar cup): Material enters  airway, remains ABOVE vocal cords and not ejected out Pharyngeal - Thin Pharyngeal - Thin Teaspoon: Premature spillage to  valleculae;Delayed swallow initiation;Lateral channel  residue;Pharyngeal residue - pyriform sinuses;Pharyngeal residue  - valleculae;Reduced anterior laryngeal mobility;Reduced  epiglottic inversion;Reduced pharyngeal peristalsis;Reduced  laryngeal elevation;Reduced airway/laryngeal closure;Reduced  tongue base retraction Pharyngeal - Thin Cup: Delayed swallow initiation;Premature  spillage to valleculae;Penetration/Aspiration during swallow Penetration/Aspiration details (thin cup): Material enters  airway, remains ABOVE vocal cords and not ejected out Pharyngeal - Solids Pharyngeal - Puree: Delayed swallow initiation;Premature spillage  to valleculae;Reduced pharyngeal peristalsis;Reduced epiglottic  inversion;Reduced anterior laryngeal mobility;Reduced laryngeal  elevation;Reduced airway/laryngeal closure;Reduced tongue base  retraction;Pharyngeal residue - valleculae;Pharyngeal residue -  pyriform sinuses;Lateral channel residue;Compensatory strategies  attempted (Comment) (mixed with secretions) Penetration/Aspiration details (puree): Material does not enter  airway  Cervical Esophageal Phase    GO    Cervical Esophageal Phase Cervical Esophageal Phase: Impaired Cervical Esophageal Phase - Nectar Nectar Teaspoon: Reduced cricopharyngeal relaxation Cervical Esophageal Phase - Thin Thin Teaspoon: Reduced cricopharyngeal relaxation Thin Cup: Reduced cricopharyngeal relaxation Cervical Esophageal Phase - Solids Puree: Reduced cricopharyngeal  relaxation Cervical Esophageal Phase - Comment Cervical Esophageal Comment: appearance of minimal amount of  stasis at distal region with esophageal sweep x1, decreased ues  opening results in increase pyriform sinus stasis         Donavan Burnet, MS [facility] SLP [phone]    Dg Swallowing Func-speech Pathology  07/06/2012   Patient Information    Patient Name Sex DOB SSN    Bolivar, Koranda Male 1945/03/14 [id number]       Procedures signed by Gray Bernhardt, CCC-SLP at 07/06/2012  2:22 PM    Author: Gray Bernhardt, CCC-SLP Service: Acute Care Author Type: Speech and Language Pathologist    Filed: 07/06/2012  2:22 PM Note Time: 07/06/2012  2:21 PM           Procedure Orders:    1. SLP modified barium swallow [[id number]] ordered by Alba Cory, MD at 07/05/12 1512       Procedures    1. SLP MODIFIED BARIUM SWALLOW [SLP1002]  Objective Swallowing Evaluation: Modified Barium Swallowing Study  Patient Details   Name: [name] MRN: [id number] Date of Birth: June 24, 1945   Today's Date: 07/06/2012 Time: 1340-1410 SLP Time Calculation (min): 30 min   Past Medical History:  Past Medical History   Diagnosis  Date   .  Hyponatremia     .  Hypokalemia     .  Anemia     .  Diabetes mellitus without complication     .  Hypertension     .  UTI (lower urinary tract infection)     .  Delusional disorder     .  HOH (hard of hearing)     .  Hyperlipidemia     .  CHF (congestive heart failure)     .  Renal insufficiency     .  Stroke     .  Dementia     .  Depression     .  Dysphagia     .  Falls frequently        Past Surgical History:  Past Surgical History   Procedure  Laterality  Date   .  Colon surgery       .  Colectomy       .  Ileostomy       .  Peg tube removal       .  Cardiac catheterization       .  Cholecystectomy       .  Abdominal adhesion surgery          HPI:   67 yo male adm to [facility] from [facility] with cellulitis of left lower extremity.  Pt with leukocytosis, hyperkalemia, dementia, DM.   Pt passed an RN  stroke swallow screen although he has h/o severe dysphagia and has undergone multiple MBS studies.  Bedside swallow evaluation recommended NPO except meds, with repeat MBS to objectively assess swallow function and safety.       Assessment / Plan / Recommendation Clinical Impression   Dysphagia Diagnosis: Severe pharyngeal phase dysphagia;Severe oral phase dysphagia Clinical impression: Pt exhibits extended oral holding with posterior spill to vallecula/pyriform and airway.  No swallow reflex was elicited, and no cough response was observed following aspiration.  Voice quality was wet, and pt unable to follow directions to clear throat or cough.  Pt would not open mouth sufficiently for oral suctioning to remove barium from oral cavity.  Pt is currently inappropriate for any po intake, including medications.       Treatment Recommendation   Therapy as outlined in treatment plan below      Diet Recommendation  NPO    Medication Administration: Via alternative means      Other  Recommendations   Consider nonoral means of nutrition/hydration and medication.   Follow Up Recommendations   Skilled Nursing facility     Frequency and Duration  min 1 x/week   1 week    Pertinent Vitals/Pain  None indicated       SLP Swallow Goals Goal #3: Pt will cough on command and clear secretions with max assistance to aid airway protection and help determine readiness for MBS.    Swallow Study Goal #3 - Progress: Progressing toward goal      General  Date of Onset: 07/05/12 HPI: 67 yo male adm to [facility] from [facility] with cellulitis of left lower extremity.  Pt with leukocytosis, hyperkalemia, dementia, DM.   Pt passed an RN stroke swallow  screen although he has h/o severe dysphagia and has undergone multiple MBS studies.  Bedside swallow evaluation recommended NPO except meds, with repeat MBS to objectively assess swallow function and safety. Type of Study: Modified Barium Swallowing Study Reason for Referral: Objectively evaluate  swallowing function Previous Swallow Assessment: BSE Wednesday 07/05/12 Diet Prior to this Study: NPO Temperature Spikes Noted: No Respiratory Status: Room air History of Recent Intubation: No Behavior/Cognition: Alert;Doesn't follow directions;Decreased sustained attention;Requires cueing Oral Cavity - Dentition: Edentulous Patient Positioning: Upright in chair Baseline Vocal Quality: Low vocal intensity;Wet Volitional Cough: Cognitively unable to elicit Volitional Swallow: Unable to elicit Anatomy: Within functional limits Pharyngeal Secretions: Not observed secondary MBS      Reason for Referral  Objectively evaluate swallowing function    Oral Phase  Oral Preparation/Oral Phase Oral Phase: Impaired Oral - Thin Oral - Thin Cup: Lingual pumping;Holding of bolus;Reduced posterior propulsion;Delayed oral transit Oral - Solids Oral - Puree: Lingual pumping;Holding of bolus;Reduced posterior propulsion    Pharyngeal Phase  Pharyngeal Phase Pharyngeal Phase: Impaired Pharyngeal - Thin Pharyngeal - Thin Cup: Delayed swallow initiation;Premature spillage to valleculae;Premature spillage to pyriform sinuses;Penetration/Aspiration before swallow;Reduced airway/laryngeal closure Penetration/Aspiration details (thin cup): Material enters airway, passes BELOW cords without attempt by patient to eject out (silent aspiration) Pharyngeal - Solids Pharyngeal - Puree: Premature spillage to valleculae;Premature spillage to pyriform sinuses;Reduced airway/laryngeal closure;Delayed swallow initiation;Penetration/Aspiration before swallow Penetration/Aspiration details (puree): Material enters airway, passes BELOW cords without attempt by patient to eject out (silent aspiration)   Cervical Esophageal Phase             Celia B. Dunning, [facility], CCC-SLP [phone]                   Leigh Aurora 07/06/2012, 2:21 PM    Results for orders placed during the hospital encounter of 07/03/12  URINE CULTURE      Result Value Range   Specimen  Description URINE, CATHETERIZED     Special Requests NONE     Culture  Setup Time 07/04/2012 04:07     Colony Count >=100,000 COLONIES/ML     Culture       Value: ESCHERICHIA COLI     Note: Two isolates with different morphologies were identified as the same organism.The most resistant organism was reported.   Report Status 07/06/2012 FINAL     Organism ID, Bacteria ESCHERICHIA COLI    CULTURE, BLOOD (ROUTINE X 2)      Result Value Range   Specimen Description BLOOD LEFT HAND     Special Requests BOTTLES DRAWN AEROBIC AND ANAEROBIC 3CC     Culture  Setup Time 07/04/2012 01:32     Culture NO GROWTH 5 DAYS     Report Status 07/10/2012 FINAL    CULTURE, BLOOD (ROUTINE X 2)      Result Value Range   Specimen Description BLOOD LEFT ANTECUBITAL     Special Requests BOTTLES DRAWN AEROBIC AND ANAEROBIC 3CC     Culture  Setup Time 07/04/2012 04:42     Culture NO GROWTH 5 DAYS     Report Status 07/10/2012 FINAL    MRSA PCR SCREENING      Result Value Range   MRSA by PCR NEGATIVE  NEGATIVE  CLOSTRIDIUM DIFFICILE BY PCR      Result Value Range   C difficile by pcr NEGATIVE  NEGATIVE  GLUCOSE, CAPILLARY      Result Value Range   Glucose-Capillary 90  70 - 99 mg/dL  URINALYSIS, ROUTINE  W REFLEX MICROSCOPIC      Result Value Range   Color, Urine YELLOW  YELLOW   APPearance CLOUDY (*) CLEAR   Specific Gravity, Urine 1.018  1.005 - 1.030   pH 5.5  5.0 - 8.0   Glucose, UA NEGATIVE  NEGATIVE mg/dL   Hgb urine dipstick TRACE (*) NEGATIVE   Bilirubin Urine NEGATIVE  NEGATIVE   Ketones, ur NEGATIVE  NEGATIVE mg/dL   Protein, ur 161 (*) NEGATIVE mg/dL   Urobilinogen, UA 0.2  0.0 - 1.0 mg/dL   Nitrite NEGATIVE  NEGATIVE   Leukocytes, UA MODERATE (*) NEGATIVE  CBC WITH DIFFERENTIAL      Result Value Range   WBC 16.8 (*) 4.0 - 10.5 K/uL   RBC 3.87 (*) 4.22 - 5.81 MIL/uL   Hemoglobin 10.3 (*) 13.0 - 17.0 g/dL   HCT 09.6 (*) 04.5 - 40.9 %   MCV 80.9  78.0 - 100.0 fL   MCH 26.6  26.0 - 34.0  pg   MCHC 32.9  30.0 - 36.0 g/dL   RDW 81.1  91.4 - 78.2 %   Platelets 178  150 - 400 K/uL   Neutrophils Relative % 90 (*) 43 - 77 %   Lymphocytes Relative 2 (*) 12 - 46 %   Monocytes Relative 8  3 - 12 %   Eosinophils Relative 0  0 - 5 %   Basophils Relative 0  0 - 1 %   Neutro Abs 15.2 (*) 1.7 - 7.7 K/uL   Lymphs Abs 0.3 (*) 0.7 - 4.0 K/uL   Monocytes Absolute 1.3 (*) 0.1 - 1.0 K/uL   Eosinophils Absolute 0.0  0.0 - 0.7 K/uL   Basophils Absolute 0.0  0.0 - 0.1 K/uL   WBC Morphology INCREASED BANDS (>20% BANDS)     Smear Review LARGE PLATELETS PRESENT    BASIC METABOLIC PANEL      Result Value Range   Sodium 132 (*) 135 - 145 mEq/L   Potassium 4.8  3.5 - 5.1 mEq/L   Chloride 99  96 - 112 mEq/L   CO2 16 (*) 19 - 32 mEq/L   Glucose, Bld 92  70 - 99 mg/dL   BUN 25 (*) 6 - 23 mg/dL   Creatinine, Ser 9.56 (*) 0.50 - 1.35 mg/dL   Calcium 21.3  8.4 - 08.6 mg/dL   GFR calc non Af Amer 16 (*) >90 mL/min   GFR calc Af Amer 18 (*) >90 mL/min  URINE MICROSCOPIC-ADD ON      Result Value Range   Squamous Epithelial / LPF FEW (*) RARE   WBC, UA 7-10  <3 WBC/hpf   RBC / HPF 0-2  <3 RBC/hpf   Bacteria, UA MANY (*) RARE  LACTIC ACID, PLASMA      Result Value Range   Lactic Acid, Venous 5.0 (*) 0.5 - 2.2 mmol/L  BASIC METABOLIC PANEL      Result Value Range   Sodium 129 (*) 135 - 145 mEq/L   Potassium 6.4 (*) 3.5 - 5.1 mEq/L   Chloride 99  96 - 112 mEq/L   CO2 17 (*) 19 - 32 mEq/L   Glucose, Bld 119 (*) 70 - 99 mg/dL   BUN 33 (*) 6 - 23 mg/dL   Creatinine, Ser 5.78 (*) 0.50 - 1.35 mg/dL   Calcium 9.6  8.4 - 46.9 mg/dL   GFR calc non Af Amer 14 (*) >90 mL/min   GFR calc Af Amer 16 (*) >90 mL/min  CBC      Result Value Range   WBC 20.6 (*) 4.0 - 10.5 K/uL   RBC 3.94 (*) 4.22 - 5.81 MIL/uL   Hemoglobin 10.4 (*) 13.0 - 17.0 g/dL   HCT 40.9 (*) 81.1 - 91.4 %   MCV 80.2  78.0 - 100.0 fL   MCH 26.4  26.0 - 34.0 pg   MCHC 32.9  30.0 - 36.0 g/dL   RDW 78.2  95.6 - 21.3 %   Platelets  151  150 - 400 K/uL  GLUCOSE, CAPILLARY      Result Value Range   Glucose-Capillary 97  70 - 99 mg/dL  BASIC METABOLIC PANEL      Result Value Range   Sodium 129 (*) 135 - 145 mEq/L   Potassium 6.0 (*) 3.5 - 5.1 mEq/L   Chloride 98  96 - 112 mEq/L   CO2 18 (*) 19 - 32 mEq/L   Glucose, Bld 131 (*) 70 - 99 mg/dL   BUN 35 (*) 6 - 23 mg/dL   Creatinine, Ser 0.86 (*) 0.50 - 1.35 mg/dL   Calcium 9.4  8.4 - 57.8 mg/dL   GFR calc non Af Amer 13 (*) >90 mL/min   GFR calc Af Amer 16 (*) >90 mL/min  CK      Result Value Range   Total CK 1457 (*) 7 - 232 U/L  BASIC METABOLIC PANEL      Result Value Range   Sodium 130 (*) 135 - 145 mEq/L   Potassium 5.3 (*) 3.5 - 5.1 mEq/L   Chloride 97  96 - 112 mEq/L   CO2 18 (*) 19 - 32 mEq/L   Glucose, Bld 164 (*) 70 - 99 mg/dL   BUN 39 (*) 6 - 23 mg/dL   Creatinine, Ser 4.69 (*) 0.50 - 1.35 mg/dL   Calcium 9.4  8.4 - 62.9 mg/dL   GFR calc non Af Amer 14 (*) >90 mL/min   GFR calc Af Amer 16 (*) >90 mL/min  GLUCOSE, CAPILLARY      Result Value Range   Glucose-Capillary 124 (*) 70 - 99 mg/dL   Comment 1 Notify RN     Comment 2 Documented in Chart    CBC      Result Value Range   WBC 18.9 (*) 4.0 - 10.5 K/uL   RBC 3.40 (*) 4.22 - 5.81 MIL/uL   Hemoglobin 9.0 (*) 13.0 - 17.0 g/dL   HCT 52.8 (*) 41.3 - 24.4 %   MCV 78.5  78.0 - 100.0 fL   MCH 26.5  26.0 - 34.0 pg   MCHC 33.7  30.0 - 36.0 g/dL   RDW 01.0  27.2 - 53.6 %   Platelets 140 (*) 150 - 400 K/uL  BASIC METABOLIC PANEL      Result Value Range   Sodium 130 (*) 135 - 145 mEq/L   Potassium 5.1  3.5 - 5.1 mEq/L   Chloride 101  96 - 112 mEq/L   CO2 19  19 - 32 mEq/L   Glucose, Bld 125 (*) 70 - 99 mg/dL   BUN 47 (*) 6 - 23 mg/dL   Creatinine, Ser 6.44 (*) 0.50 - 1.35 mg/dL   Calcium 9.1  8.4 - 03.4 mg/dL   GFR calc non Af Amer 15 (*) >90 mL/min   GFR calc Af Amer 17 (*) >90 mL/min  GLUCOSE, CAPILLARY      Result Value Range   Glucose-Capillary 154 (*) 70 - 99 mg/dL  Comment 1 Notify  RN     Comment 2 Documented in Chart    GLUCOSE, CAPILLARY      Result Value Range   Glucose-Capillary 151 (*) 70 - 99 mg/dL   Comment 1 Notify RN     Comment 2 Documented in Chart    GLUCOSE, CAPILLARY      Result Value Range   Glucose-Capillary 115 (*) 70 - 99 mg/dL   Comment 1 Notify RN    GLUCOSE, CAPILLARY      Result Value Range   Glucose-Capillary 118 (*) 70 - 99 mg/dL   Comment 1 Documented in Chart     Comment 2 Notify RN    GLUCOSE, CAPILLARY      Result Value Range   Glucose-Capillary 124 (*) 70 - 99 mg/dL   Comment 1 Documented in Chart     Comment 2 Notify RN    GLUCOSE, CAPILLARY      Result Value Range   Glucose-Capillary 132 (*) 70 - 99 mg/dL  CBC      Result Value Range   WBC 24.4 (*) 4.0 - 10.5 K/uL   RBC 2.94 (*) 4.22 - 5.81 MIL/uL   Hemoglobin 7.8 (*) 13.0 - 17.0 g/dL   HCT 16.1 (*) 09.6 - 04.5 %   MCV 76.9 (*) 78.0 - 100.0 fL   MCH 26.5  26.0 - 34.0 pg   MCHC 34.5  30.0 - 36.0 g/dL   RDW 40.9  81.1 - 91.4 %   Platelets 148 (*) 150 - 400 K/uL  BASIC METABOLIC PANEL      Result Value Range   Sodium 135  135 - 145 mEq/L   Potassium 3.7  3.5 - 5.1 mEq/L   Chloride 107  96 - 112 mEq/L   CO2 18 (*) 19 - 32 mEq/L   Glucose, Bld 79  70 - 99 mg/dL   BUN 45 (*) 6 - 23 mg/dL   Creatinine, Ser 7.82 (*) 0.50 - 1.35 mg/dL   Calcium 8.6  8.4 - 95.6 mg/dL   GFR calc non Af Amer 20 (*) >90 mL/min   GFR calc Af Amer 23 (*) >90 mL/min  CK TOTAL AND CKMB      Result Value Range   Total CK 229  7 - 232 U/L   CK, MB 5.8 (*) 0.3 - 4.0 ng/mL   Relative Index 2.5  0.0 - 2.5  GLUCOSE, CAPILLARY      Result Value Range   Glucose-Capillary 120 (*) 70 - 99 mg/dL  GLUCOSE, CAPILLARY      Result Value Range   Glucose-Capillary 82  70 - 99 mg/dL   Comment 1 Documented in Chart     Comment 2 Notify RN    GLUCOSE, CAPILLARY      Result Value Range   Glucose-Capillary 73  70 - 99 mg/dL   Comment 1 Documented in Chart     Comment 2 Notify RN    GLUCOSE, CAPILLARY       Result Value Range   Glucose-Capillary 73  70 - 99 mg/dL  CBC      Result Value Range   WBC 22.5 (*) 4.0 - 10.5 K/uL   RBC 3.07 (*) 4.22 - 5.81 MIL/uL   Hemoglobin 8.2 (*) 13.0 - 17.0 g/dL   HCT 21.3 (*) 08.6 - 57.8 %   MCV 76.9 (*) 78.0 - 100.0 fL   MCH 26.7  26.0 - 34.0 pg   MCHC 34.7  30.0 -  36.0 g/dL   RDW 16.1  09.6 - 04.5 %   Platelets 166  150 - 400 K/uL  BASIC METABOLIC PANEL      Result Value Range   Sodium 139  135 - 145 mEq/L   Potassium 3.7  3.5 - 5.1 mEq/L   Chloride 111  96 - 112 mEq/L   CO2 18 (*) 19 - 32 mEq/L   Glucose, Bld 104 (*) 70 - 99 mg/dL   BUN 40 (*) 6 - 23 mg/dL   Creatinine, Ser 4.09 (*) 0.50 - 1.35 mg/dL   Calcium 8.6  8.4 - 81.1 mg/dL   GFR calc non Af Amer 25 (*) >90 mL/min   GFR calc Af Amer 29 (*) >90 mL/min  GLUCOSE, CAPILLARY      Result Value Range   Glucose-Capillary 72  70 - 99 mg/dL   Comment 1 Documented in Chart     Comment 2 Notify RN    GLUCOSE, CAPILLARY      Result Value Range   Glucose-Capillary 106 (*) 70 - 99 mg/dL   Comment 1 Documented in Chart     Comment 2 Notify RN    GLUCOSE, CAPILLARY      Result Value Range   Glucose-Capillary 96  70 - 99 mg/dL  CBC      Result Value Range   WBC 10.6 (*) 4.0 - 10.5 K/uL   RBC 3.08 (*) 4.22 - 5.81 MIL/uL   Hemoglobin 8.1 (*) 13.0 - 17.0 g/dL   HCT 91.4 (*) 78.2 - 95.6 %   MCV 76.9 (*) 78.0 - 100.0 fL   MCH 26.3  26.0 - 34.0 pg   MCHC 34.2  30.0 - 36.0 g/dL   RDW 21.3  08.6 - 57.8 %   Platelets 169  150 - 400 K/uL  BASIC METABOLIC PANEL      Result Value Range   Sodium 146 (*) 135 - 145 mEq/L   Potassium 3.7  3.5 - 5.1 mEq/L   Chloride 116 (*) 96 - 112 mEq/L   CO2 20  19 - 32 mEq/L   Glucose, Bld 121 (*) 70 - 99 mg/dL   BUN 36 (*) 6 - 23 mg/dL   Creatinine, Ser 4.69 (*) 0.50 - 1.35 mg/dL   Calcium 8.6  8.4 - 62.9 mg/dL   GFR calc non Af Amer 27 (*) >90 mL/min   GFR calc Af Amer 32 (*) >90 mL/min  GLUCOSE, CAPILLARY      Result Value Range   Glucose-Capillary 88  70 -  99 mg/dL  GLUCOSE, CAPILLARY      Result Value Range   Glucose-Capillary 91  70 - 99 mg/dL  GLUCOSE, CAPILLARY      Result Value Range   Glucose-Capillary 104 (*) 70 - 99 mg/dL  GLUCOSE, CAPILLARY      Result Value Range   Glucose-Capillary 99  70 - 99 mg/dL  GLUCOSE, CAPILLARY      Result Value Range   Glucose-Capillary 109 (*) 70 - 99 mg/dL   Comment 1 Documented in Chart     Comment 2 Notify RN    GLUCOSE, CAPILLARY      Result Value Range   Glucose-Capillary 102 (*) 70 - 99 mg/dL   Comment 1 Documented in Chart     Comment 2 Notify RN    GLUCOSE, CAPILLARY      Result Value Range   Glucose-Capillary 101 (*) 70 - 99 mg/dL   Comment 1 Documented in  Chart     Comment 2 Notify RN    GLUCOSE, CAPILLARY      Result Value Range   Glucose-Capillary 107 (*) 70 - 99 mg/dL  GLUCOSE, CAPILLARY      Result Value Range   Glucose-Capillary 108 (*) 70 - 99 mg/dL  GLUCOSE, CAPILLARY      Result Value Range   Glucose-Capillary 123 (*) 70 - 99 mg/dL  GLUCOSE, CAPILLARY      Result Value Range   Glucose-Capillary 113 (*) 70 - 99 mg/dL   Comment 1 Documented in Chart     Comment 2 Notify RN    BASIC METABOLIC PANEL      Result Value Range   Sodium 144  135 - 145 mEq/L   Potassium 3.2 (*) 3.5 - 5.1 mEq/L   Chloride 112  96 - 112 mEq/L   CO2 19  19 - 32 mEq/L   Glucose, Bld 126 (*) 70 - 99 mg/dL   BUN 33 (*) 6 - 23 mg/dL   Creatinine, Ser 1.61 (*) 0.50 - 1.35 mg/dL   Calcium 9.0  8.4 - 09.6 mg/dL   GFR calc non Af Amer 32 (*) >90 mL/min   GFR calc Af Amer 38 (*) >90 mL/min  CBC      Result Value Range   WBC 9.8  4.0 - 10.5 K/uL   RBC 3.29 (*) 4.22 - 5.81 MIL/uL   Hemoglobin 8.5 (*) 13.0 - 17.0 g/dL   HCT 04.5 (*) 40.9 - 81.1 %   MCV 77.2 (*) 78.0 - 100.0 fL   MCH 25.8 (*) 26.0 - 34.0 pg   MCHC 33.5  30.0 - 36.0 g/dL   RDW 91.4  78.2 - 95.6 %   Platelets 180  150 - 400 K/uL  GLUCOSE, CAPILLARY      Result Value Range   Glucose-Capillary 113 (*) 70 - 99 mg/dL   Comment 1  Documented in Chart     Comment 2 Notify RN    BASIC METABOLIC PANEL      Result Value Range   Sodium 142  135 - 145 mEq/L   Potassium 3.4 (*) 3.5 - 5.1 mEq/L   Chloride 111  96 - 112 mEq/L   CO2 20  19 - 32 mEq/L   Glucose, Bld 138 (*) 70 - 99 mg/dL   BUN 31 (*) 6 - 23 mg/dL   Creatinine, Ser 2.13 (*) 0.50 - 1.35 mg/dL   Calcium 9.1  8.4 - 08.6 mg/dL   GFR calc non Af Amer 35 (*) >90 mL/min   GFR calc Af Amer 41 (*) >90 mL/min  CBC      Result Value Range   WBC 8.4  4.0 - 10.5 K/uL   RBC 3.16 (*) 4.22 - 5.81 MIL/uL   Hemoglobin 8.1 (*) 13.0 - 17.0 g/dL   HCT 57.8 (*) 46.9 - 62.9 %   MCV 77.2 (*) 78.0 - 100.0 fL   MCH 25.6 (*) 26.0 - 34.0 pg   MCHC 33.2  30.0 - 36.0 g/dL   RDW 52.8  41.3 - 24.4 %   Platelets 187  150 - 400 K/uL  GLUCOSE, CAPILLARY      Result Value Range   Glucose-Capillary 90  70 - 99 mg/dL  GLUCOSE, CAPILLARY      Result Value Range   Glucose-Capillary 141 (*) 70 - 99 mg/dL   Comment 1 Documented in Chart     Comment 2 Notify RN  GLUCOSE, CAPILLARY      Result Value Range   Glucose-Capillary 86  70 - 99 mg/dL   Comment 1 Documented in Chart     Comment 2 Notify RN    GLUCOSE, CAPILLARY      Result Value Range   Glucose-Capillary 139 (*) 70 - 99 mg/dL   Comment 1 Notify RN     Comment 2 Documented in Chart    GLUCOSE, CAPILLARY      Result Value Range   Glucose-Capillary 113 (*) 70 - 99 mg/dL  GLUCOSE, CAPILLARY      Result Value Range   Glucose-Capillary 129 (*) 70 - 99 mg/dL  GLUCOSE, CAPILLARY      Result Value Range   Glucose-Capillary 138 (*) 70 - 99 mg/dL  CBC      Result Value Range   WBC 9.8  4.0 - 10.5 K/uL   RBC 3.22 (*) 4.22 - 5.81 MIL/uL   Hemoglobin 8.4 (*) 13.0 - 17.0 g/dL   HCT 19.1 (*) 47.8 - 29.5 %   MCV 77.3 (*) 78.0 - 100.0 fL   MCH 26.1  26.0 - 34.0 pg   MCHC 33.7  30.0 - 36.0 g/dL   RDW 62.1  30.8 - 65.7 %   Platelets 193  150 - 400 K/uL  BASIC METABOLIC PANEL      Result Value Range   Sodium 142  135 - 145  mEq/L   Potassium 3.6  3.5 - 5.1 mEq/L   Chloride 111  96 - 112 mEq/L   CO2 22  19 - 32 mEq/L   Glucose, Bld 113 (*) 70 - 99 mg/dL   BUN 28 (*) 6 - 23 mg/dL   Creatinine, Ser 8.46 (*) 0.50 - 1.35 mg/dL   Calcium 9.1  8.4 - 96.2 mg/dL   GFR calc non Af Amer 39 (*) >90 mL/min   GFR calc Af Amer 45 (*) >90 mL/min  GLUCOSE, CAPILLARY      Result Value Range   Glucose-Capillary 118 (*) 70 - 99 mg/dL   Comment 1 Documented in Chart     Comment 2 Notify RN    GLUCOSE, CAPILLARY      Result Value Range   Glucose-Capillary 107 (*) 70 - 99 mg/dL   Comment 1 Documented in Chart     Comment 2 Notify RN    GLUCOSE, CAPILLARY      Result Value Range   Glucose-Capillary 95  70 - 99 mg/dL   Comment 1 Documented in Chart     Comment 2 Notify RN    GLUCOSE, CAPILLARY      Result Value Range   Glucose-Capillary 105 (*) 70 - 99 mg/dL  GLUCOSE, CAPILLARY      Result Value Range   Glucose-Capillary 84  70 - 99 mg/dL  GLUCOSE, CAPILLARY      Result Value Range   Glucose-Capillary 130 (*) 70 - 99 mg/dL   Comment 1 Notify RN    CBC      Result Value Range   WBC 10.4  4.0 - 10.5 K/uL   RBC 3.36 (*) 4.22 - 5.81 MIL/uL   Hemoglobin 8.8 (*) 13.0 - 17.0 g/dL   HCT 95.2 (*) 84.1 - 32.4 %   MCV 77.7 (*) 78.0 - 100.0 fL   MCH 26.2  26.0 - 34.0 pg   MCHC 33.7  30.0 - 36.0 g/dL   RDW 40.1  02.7 - 25.3 %   Platelets 228  150 -  400 K/uL  BASIC METABOLIC PANEL      Result Value Range   Sodium 138  135 - 145 mEq/L   Potassium 3.4 (*) 3.5 - 5.1 mEq/L   Chloride 106  96 - 112 mEq/L   CO2 21  19 - 32 mEq/L   Glucose, Bld 110 (*) 70 - 99 mg/dL   BUN 24 (*) 6 - 23 mg/dL   Creatinine, Ser 9.60 (*) 0.50 - 1.35 mg/dL   Calcium 9.0  8.4 - 45.4 mg/dL   GFR calc non Af Amer 46 (*) >90 mL/min   GFR calc Af Amer 53 (*) >90 mL/min  GLUCOSE, CAPILLARY      Result Value Range   Glucose-Capillary 101 (*) 70 - 99 mg/dL   Comment 1 Documented in Chart     Comment 2 Notify RN    GLUCOSE, CAPILLARY      Result  Value Range   Glucose-Capillary 110 (*) 70 - 99 mg/dL  GLUCOSE, CAPILLARY      Result Value Range   Glucose-Capillary 103 (*) 70 - 99 mg/dL  GLUCOSE, CAPILLARY      Result Value Range   Glucose-Capillary 97  70 - 99 mg/dL   A/P: Pt with hx of dementia,CVA, FTT, severe dysphagia, 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 Tent plan is for placement of a percutaneous gastrostomy tube on 7/3. Consent signed by pt's sister. Will check f/u CXR and KUB in am and proceed if pt stable.

## 2012-07-13 ENCOUNTER — Inpatient Hospital Stay (HOSPITAL_COMMUNITY): Payer: Medicare Other

## 2012-07-13 DIAGNOSIS — I150 Renovascular hypertension: Secondary | ICD-10-CM

## 2012-07-13 LAB — CBC
HCT: 23.8 % — ABNORMAL LOW (ref 39.0–52.0)
Hemoglobin: 8 g/dL — ABNORMAL LOW (ref 13.0–17.0)
MCHC: 33.6 g/dL (ref 30.0–36.0)
RBC: 3.08 MIL/uL — ABNORMAL LOW (ref 4.22–5.81)

## 2012-07-13 LAB — PROTIME-INR
INR: 1.24 (ref 0.00–1.49)
Prothrombin Time: 15.3 seconds — ABNORMAL HIGH (ref 11.6–15.2)

## 2012-07-13 LAB — GLUCOSE, CAPILLARY
Glucose-Capillary: 117 mg/dL — ABNORMAL HIGH (ref 70–99)
Glucose-Capillary: 119 mg/dL — ABNORMAL HIGH (ref 70–99)
Glucose-Capillary: 106 mg/dL — ABNORMAL HIGH (ref 70–99)

## 2012-07-13 LAB — BASIC METABOLIC PANEL
Chloride: 106 mEq/L (ref 96–112)
GFR calc Af Amer: 56 mL/min — ABNORMAL LOW (ref >90)
GFR calc non Af Amer: 48 mL/min — ABNORMAL LOW (ref >90)
Potassium: 3.4 mEq/L — ABNORMAL LOW (ref 3.5–5.1)
Sodium: 137 mEq/L (ref 135–145)

## 2012-07-13 MED ORDER — FENTANYL CITRATE 0.05 MG/ML IJ SOLN
INTRAMUSCULAR | Status: AC
  Filled 2012-07-13: qty 6

## 2012-07-13 MED ORDER — MIDAZOLAM HCL 2 MG/2ML IJ SOLN
INTRAMUSCULAR | Status: AC | PRN
  Administered 2012-07-13: 1 mg via INTRAVENOUS
  Administered 2012-07-13: 0.5 mg via INTRAVENOUS

## 2012-07-13 MED ORDER — FREE WATER
120.0000 mL | Status: DC
  Administered 2012-07-14: 120 mL

## 2012-07-13 MED ORDER — FUROSEMIDE 20 MG PO TABS
20.0000 mg | ORAL_TABLET | Freq: Every day | ORAL | Status: DC
  Administered 2012-07-13 – 2012-07-14 (×2): 20 mg via ORAL
  Filled 2012-07-13 (×2): qty 1

## 2012-07-13 MED ORDER — MIDAZOLAM HCL 2 MG/2ML IJ SOLN
INTRAMUSCULAR | Status: AC
  Filled 2012-07-13: qty 6

## 2012-07-13 MED ORDER — FENTANYL CITRATE 0.05 MG/ML IJ SOLN
INTRAMUSCULAR | Status: AC | PRN
  Administered 2012-07-13: 50 ug via INTRAVENOUS
  Administered 2012-07-13: 25 ug via INTRAVENOUS

## 2012-07-13 MED ORDER — POTASSIUM CHLORIDE CRYS ER 20 MEQ PO TBCR
40.0000 meq | EXTENDED_RELEASE_TABLET | Freq: Every day | ORAL | Status: DC
  Administered 2012-07-13 – 2012-07-14 (×2): 40 meq via ORAL
  Filled 2012-07-13 (×2): qty 2

## 2012-07-13 MED ORDER — IOHEXOL 300 MG/ML  SOLN
10.0000 mL | Freq: Once | INTRAMUSCULAR | Status: AC | PRN
  Administered 2012-07-13: 10 mL

## 2012-07-13 MED ORDER — CLINDAMYCIN PHOSPHATE 300 MG/50ML IV SOLN
300.0000 mg | INTRAVENOUS | Status: AC
  Administered 2012-07-13: 300 mg via INTRAVENOUS
  Filled 2012-07-13: qty 50

## 2012-07-13 MED ORDER — OSMOLITE 1.2 CAL PO LIQD
1000.0000 mL | ORAL | Status: DC
  Filled 2012-07-13 (×2): qty 1000

## 2012-07-13 MED ORDER — TAMSULOSIN HCL 0.4 MG PO CAPS
0.4000 mg | ORAL_CAPSULE | Freq: Every day | ORAL | Status: DC
  Filled 2012-07-13 (×2): qty 1

## 2012-07-13 NOTE — Progress Notes (Signed)
Progress Note from the Palliative Medicine Team at [facility]  Subjective: patient awakens to gentle touch, appears comfortable, difficult communication secondary to altered cognition, and hearing impariment     Objective: No Known Allergies Scheduled Meds: . antiseptic oral rinse  15 mL Mouth Rinse q12n4p  . aspirin EC  81 mg Oral Daily  . cefTRIAXone (ROCEPHIN)  IV  1 g Intravenous Daily  . chlorhexidine  15 mL Mouth Rinse BID  . clindamycin  300 mg Oral Q8H  . donepezil  10 mg Oral QHS  . enoxaparin (LOVENOX) injection  40 mg Subcutaneous Q24H  . feeding supplement  1 Container Oral BID BM  . insulin aspart  0-9 Units Subcutaneous TID WC  . QUEtiapine  25 mg Oral Daily  . sertraline  150 mg Oral Daily  . sodium chloride  1,000 mL Intravenous Once  . sodium chloride  3 mL Intravenous Q12H  . traZODone  25 mg Oral Daily   Continuous Infusions: . dextrose 5 % and 0.45% NaCl 75 mL/hr at 07/13/12 0340   PRN Meds:.  BP 149/65  Pulse 63  Temp(Src) 98.9 F (37.2 C) (Oral)  Resp 20  Ht 5\' 10"  (1.778 m)  Wt 100.8 kg (222 lb 3.6 oz)  BMI 31.89 kg/m2  SpO2 99%   PPS:30 % at best    Intake/Output Summary (Last 24 hours) at 07/13/12 0919 Last data filed at 07/13/12 1610  Gross per 24 hour  Intake 2531.25 ml  Output   2625 ml  Net -93.75 ml       Physical Exam:  General: chronically ill appearing, NAD  HEENT: Normocephalic, mm no exudate  Chest: CTA  CVS: RRR  Abdomen: soft NT +BS  Ext: LLE with + 2 edema and redden ness , noted toe dressing  Neuro: difficult to evaluate, unable to communicate with patient even when speaking loudly, patient speech is mumbled (called place to [facility] and nursing staff report same difficulty at facility)  Labs: CBC    Component Value Date/Time   WBC 9.1 07/13/2012 0503   WBC 4.5 04/09/2009 0912   RBC 3.08* 07/13/2012 0503   HGB 8.0* 07/13/2012 0503   HGB 11.0* 04/09/2009 0912   HCT 23.8* 07/13/2012 0503   HCT 34.0* 04/09/2009 0912   PLT 228 07/13/2012 0503   PLT 219 04/09/2009 0912   MCV 77.3* 07/13/2012 0503   MCV 81* 04/09/2009 0912   MCH 26.0 07/13/2012 0503   MCH 26.0* 04/09/2009 0912   MCHC 33.6 07/13/2012 0503   MCHC 32.3 04/09/2009 0912   RDW 14.5 07/13/2012 0503   RDW 17.1* 04/09/2009 0912   LYMPHSABS 0.3* 07/03/2012 2100   LYMPHSABS 0.8* 04/09/2009 0912   MONOABS 1.3* 07/03/2012 2100   EOSABS 0.0 07/03/2012 2100   EOSABS 0.1 04/09/2009 0912   BASOSABS 0.0 07/03/2012 2100   BASOSABS 0.0 04/09/2009 0912    BMET    Component Value Date/Time   NA 137 07/13/2012 0503   K 3.4* 07/13/2012 0503   CL 106 07/13/2012 0503   CO2 22 07/13/2012 0503   GLUCOSE 129* 07/13/2012 0503   BUN 20 07/13/2012 0503   CREATININE 1.46* 07/13/2012 0503   CALCIUM 8.9 07/13/2012 0503   GFRNONAA 48* 07/13/2012 0503   GFRAA 56* 07/13/2012 0503    CMP     Component Value Date/Time   NA 137 07/13/2012 0503   K 3.4* 07/13/2012 0503   CL 106 07/13/2012 0503   CO2 22 07/13/2012 0503  GLUCOSE 129* 07/13/2012 0503   BUN 20 07/13/2012 0503   CREATININE 1.46* 07/13/2012 0503   CALCIUM 8.9 07/13/2012 0503   PROT 6.9 04/09/2009 0912   ALBUMIN 3.8 04/09/2009 0912   AST 18 04/09/2009 0912   ALT 13 04/09/2009 0912   ALKPHOS 63 04/09/2009 0912   BILITOT 0.4 04/09/2009 0912   GFRNONAA 48* 07/13/2012 0503   GFRAA 56* 07/13/2012 0503     Assessment and Plan:  1. Code Status: Full code   2. Scope of Treatment:  Family request all available medical interventions to prolong life at this time, including feeding tube.   4. Disposition: When medically stable dc back to SNF with physical therapy, speech therapy and palliative  care services. Family is hopeful for return to baseline   3. Symptom Management:  1. Dysphagia: family request for artificial feeding source (PEG) to be considered. (risks and benefits discussed in detail)    Lorinda Creed NP  Palliative Medicine Team Team Phone # [phone] Pager [phone]  PMT will continue to support holistically, recommend Palliative services on  discahrge 1

## 2012-07-13 NOTE — Procedures (Signed)
Interventional Radiology Procedure Note  Procedure: Placement of percutaneous 20F pull-through gastrostomy tube. Complications: None Recommendations: - NPO except for sips and chips remainder of today and overnight - Maintain G-tube to LWS until tomorrow morning  - May advance diet as tolerated and begin using tube tomorrow at noon.  Signed,  Heath K. McCullough, MD Vascular & Interventional Radiologist [facility]  

## 2012-07-13 NOTE — Progress Notes (Signed)
NUTRITION FOLLOW UP  Intervention:   Discontinue Resource Breeze  Initiate Osmolite 1.2 @ 20 ml/hr via PEG and increase by 10 ml every 4 hours to goal rate of 70 ml/hr. At goal rate, tube feeding regimen will provide 2016 kcal, 93 grams of protein, and 1378 ml of H2O. Provide 120 ml free water flushes q 4 hours.    Nutrition Dx:   Swallowing difficulty related to cognitive deficit as evidenced by SLP evaluation; ongoing  Goal:   Pt able to safely consume >90% of meals/supplements; discontinued, pt now NPO New Goal:  Pt to meet >/= 90% of their estimated nutrition needs  Monitor:   Weights; no new wt since 6/24  Labs; High creatinine trending down, Low GFR trending up, Low hemoglobin Palliative care discussion; "Family request all available medical interventions to prolong life at this time, including feeding tube"   Assessment:   RD consulted for tube feeding initiation and management. Pt is now NPO and PEG is to be placed today. Cellulitis of LLE is improving. Creatinine is trending down and is almost at baseline. Per palliative note, pt was ambulatory PTA, continent, and had full conversations.   Height: Ht Readings from Last 1 Encounters:  07/04/12 5\' 10"  (1.778 m)    Weight Status:   Wt Readings from Last 1 Encounters:  07/04/12 222 lb 3.6 oz (100.8 kg)    Re-estimated needs:  Kcal: 2070- 2260  Protein: 80-90 g  Fluid: 2.1-2.3 L/day  Skin: non-pitting RLE and LLE edema; abdominal incision, and diabetic ulcer on left toe  Diet Order: NPO   Intake/Output Summary (Last 24 hours) at 07/13/12 1238 Last data filed at 07/13/12 0952  Gross per 24 hour  Intake 2051.25 ml  Output   2550 ml  Net -498.75 ml    Last BM: 7/2, ileostomy 450 ml   Labs:   Recent Labs Lab 07/11/12 0530 07/12/12 0500 07/13/12 0503  NA 142 138 137  K 3.6 3.4* 3.4*  CL 111 106 106  CO2 22 21 22   BUN 28* 24* 20  CREATININE 1.73* 1.52* 1.46*  CALCIUM 9.1 9.0 8.9  GLUCOSE 113* 110*  129*    CBG (last 3)   Recent Labs  07/12/12 2146 07/13/12 0752 07/13/12 1213  GLUCAP 117* 119* 106*    Scheduled Meds: . antiseptic oral rinse  15 mL Mouth Rinse q12n4p  . aspirin EC  81 mg Oral Daily  . cefTRIAXone (ROCEPHIN)  IV  1 g Intravenous Daily  . chlorhexidine  15 mL Mouth Rinse BID  . clindamycin  300 mg Oral Q8H  . donepezil  10 mg Oral QHS  . enoxaparin (LOVENOX) injection  40 mg Subcutaneous Q24H  . feeding supplement  1 Container Oral BID BM  . furosemide  20 mg Oral Daily  . insulin aspart  0-9 Units Subcutaneous TID WC  . potassium chloride  40 mEq Oral Daily  . QUEtiapine  25 mg Oral Daily  . sertraline  150 mg Oral Daily  . sodium chloride  1,000 mL Intravenous Once  . sodium chloride  3 mL Intravenous Q12H  . tamsulosin  0.4 mg Oral QPC supper  . traZODone  25 mg Oral Daily    Continuous Infusions: . dextrose 5 % and 0.45% NaCl 75 mL/hr at 07/13/12 0340    Ian Malkin RD, LDN Inpatient Clinical Dietitian Pager: [phone] After Hours Pager: [phone]

## 2012-07-13 NOTE — Progress Notes (Signed)
Note pt now has PEG for nutrition.  If MD desires, please consider to allow po intake *full liquids, no pudding/grits* to maximize pt's QOL and comfort. Also recommend follow up at SNF for dysphagia management with SLP to maximize pt's swallowing recovery with continuing medical recovery and nutritional support.  Thanks.   Donavan Burnet, MS [facility] SLP [phone]

## 2012-07-13 NOTE — Progress Notes (Signed)
07/13/12 1400  PT Visit Information  Last PT Received On 07/13/12  Reason Eval/Treat Not Completed Patient at procedure or test/unavailable

## 2012-07-13 NOTE — Progress Notes (Signed)
[facility] PROGRESS NOTE  [name] NWG:[id number] DOB: 1945/09/25 DOA: 07/03/2012 PCP: Terald Sleeper, MD  Assessment/Plan:  1-Cellulitis of LLE, ulcer big toe.  -Improving.  -continue  clindamycin day 10/10. -dopplers negative for  DVT  -still swollen but erythema continue improving  2. Leukocytosis  -trending down today at 9.  -Others possibility aspiration PNA. Clindamycin should cover for aspiration PNA. Chest x ray 6-28  with infiltrates, and slight pulmonary congestion -adjust IVF's -Urine grew E coli. Continue with ceftriaxone day 7/10.   3. ARF on CKD: baseline creatinine 1.4  -Component of rhabdomyolysis, dehydration and continue use of nephrotoxic agents. -Continue with IV fluids, but will continue adjusting rate. Continue holding lasix  -renal US: No acute urinary pathology. Mild cortical thinning bilaterally. -good urine out put. Cr trending down and almost at baseline. Today at 1.46 -will resume lasix  4. Hyperkalemia  -Resolved.  -Received kayexalate and insulin.  -will monitor  5. Dementia: moderately advanced  -Patient was able to engage in a conversation today.  -continue aricept, seroquel   6. DM  -continue levemir and SSI  -adjustment to be done after tube feeding initiated; further titration will be required as an outpatient.  7. DVT proph: lovenox  8-E. Coli UTI, Urine grew 100-K E coli sensitive to  ceftriaxone. Continue with ceftriaxone day 6/10.  9-Anemia: monitor for bleed. Hb stable (remains around 8).   10-Dysphagia: Patient with severe oral  pharyngeal dysphagia. Per meeting plan is for full code and everything done. IR consulted for PEG tube placement (will be place today).  Code Status: Full Code.  Family Communication: sister.  Disposition Plan: Back to SNF when stable.    Consultants:  Wound care nurse.   Procedures:  Doppler LE negative for DVT.   Antibiotics:  Clindamycin 6-24   Ceftriaxone  6-26  HPI/Subjective: Patient denies CP, SOB and abd pain; no overnight events  Objective: Filed Vitals:   07/12/12 0630 07/12/12 1357 07/12/12 2215 07/13/12 0530  BP: 136/57 150/64 162/58 149/65  Pulse: 63 58 64 63  Temp: 98 F (36.7 C) 98 F (36.7 C) 98.8 F (37.1 C) 98.9 F (37.2 C)  TempSrc: Oral Oral Oral Oral  Resp: 24 20 20 20   Height:      Weight:      SpO2: 100% 100% 100% 99%    Intake/Output Summary (Last 24 hours) at 07/13/12 1139 Last data filed at 07/13/12 0952  Gross per 24 hour  Intake 2051.25 ml  Output   2550 ml  Net -498.75 ml   Filed Weights   07/04/12 0200  Weight: 100.8 kg (222 lb 3.6 oz)    Exam:   General:  No distress. Afebrile; able to follow simple commands; hard of hearing. AAOX2  Cardiovascular:S 1, S 2 RRR  Respiratory: Bilateral ronchus.   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  Musculoskeletal: left LE with decreases erythema, edema. Left big toe with ulcer covered with clean dressings.  Data Reviewed: Basic Metabolic Panel:  Recent Labs Lab 07/09/12 1026 07/10/12 0440 07/11/12 0530 07/12/12 0500 07/13/12 0503  NA 144 142 142 138 137  K 3.2* 3.4* 3.6 3.4* 3.4*  CL 112 111 111 106 106  CO2 19 20 22 21 22   GLUCOSE 126* 138* 113* 110* 129*  BUN 33* 31* 28* 24* 20  CREATININE 2.02* 1.89* 1.73* 1.52* 1.46*  CALCIUM 9.0 9.1 9.1 9.0 8.9   CBC:  Recent Labs Lab 07/09/12 1026  07/10/12 0440 07/11/12 0530 07/12/12 0500 07/13/12 0503  WBC 9.8 8.4 9.8 10.4 9.1  HGB 8.5* 8.1* 8.4* 8.8* 8.0*  HCT 25.4* 24.4* 24.9* 26.1* 23.8*  MCV 77.2* 77.2* 77.3* 77.7* 77.3*  PLT 180 187 193 228 228   CBG:  Recent Labs Lab 07/12/12 0801 07/12/12 1154 07/12/12 1642 07/12/12 2146 07/13/12 0752  GLUCAP 103* 110* 97 117* 119*    Recent Results (from the past 240 hour(s))  CULTURE, BLOOD (ROUTINE X 2)     Status: None   Collection Time    07/03/12  9:00 PM      Result Value  Range Status   Specimen Description BLOOD LEFT ANTECUBITAL   Final   Special Requests BOTTLES DRAWN AEROBIC AND ANAEROBIC 3CC   Final   Culture  Setup Time 07/04/2012 04:42   Final   Culture NO GROWTH 5 DAYS   Final   Report Status 07/10/2012 FINAL   Final  URINE CULTURE     Status: None   Collection Time    07/03/12  9:12 PM      Result Value Range Status   Specimen Description URINE, CATHETERIZED   Final   Special Requests NONE   Final   Culture  Setup Time 07/04/2012 04:07   Final   Colony Count >=100,000 COLONIES/ML   Final   Culture     Final   Value: ESCHERICHIA COLI     Note: Two isolates with different morphologies were identified as the same organism.The most resistant organism was reported.   Report Status 07/06/2012 FINAL   Final   Organism ID, Bacteria ESCHERICHIA COLI   Final  CULTURE, BLOOD (ROUTINE X 2)     Status: None   Collection Time    07/03/12 10:30 PM      Result Value Range Status   Specimen Description BLOOD LEFT HAND   Final   Special Requests BOTTLES DRAWN AEROBIC AND ANAEROBIC 3CC   Final   Culture  Setup Time 07/04/2012 01:32   Final   Culture NO GROWTH 5 DAYS   Final   Report Status 07/10/2012 FINAL   Final  MRSA PCR SCREENING     Status: None   Collection Time    07/04/12  2:44 AM      Result Value Range Status   MRSA by PCR NEGATIVE  NEGATIVE Final   Comment:            The GeneXpert MRSA Assay (FDA     approved for NASAL specimens     only), is one component of a     comprehensive MRSA colonization     surveillance program. It is not     intended to diagnose MRSA     infection nor to guide or     monitor treatment for     MRSA infections.  CLOSTRIDIUM DIFFICILE BY PCR     Status: None   Collection Time    07/06/12 11:22 AM      Result Value Range Status   C difficile by pcr NEGATIVE  NEGATIVE Final     Studies: Dg Chest Port 1 View  07/13/2012   *RADIOLOGY REPORT*  Clinical Data: Follow up pulmonary edema  PORTABLE CHEST - 1 VIEW   Comparison: Prior chest x-ray 07/07/2012  Findings: A single frontal view of the chest demonstrates pulmonary vascular congestion and mild pulmonary edema.  There are likely trace bilateral pleural effusions.  Bibasilar retrocardiac opacities favored to reflect atelectasis.  Similar enlargement  the cardiopericardial silhouette compared to prior.  Atherosclerotic calcifications noted in the transverse aorta.  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  No acute osseous abnormality.  IMPRESSION:  1.  Mild - moderate CHF similar to slightly improved compared to prior. 2.  Small bilateral pleural effusions and associated bibasilar atelectasis.   Original Report Authenticated By: Malachy Moan, M.D.   Dg Abd Portable 1v  07/13/2012   *RADIOLOGY REPORT*  Clinical Data: Pre gastrostomy tube placement  PORTABLE ABDOMEN - 1 VIEW  Comparison: Prior CT abdomen/pelvis 09/13/2008  Findings: Nonobstructed bowel gas pattern.  No enteric tube present.  Mild scoliosis with lower lumbar facet arthropathy.  No free air.  No acute osseous abnormality.  IMPRESSION: Nonobstructed bowel gas pattern.   Original Report Authenticated By: Malachy Moan, M.D.    Scheduled Meds: . antiseptic oral rinse  15 mL Mouth Rinse q12n4p  . aspirin EC  81 mg Oral Daily  . cefTRIAXone (ROCEPHIN)  IV  1 g Intravenous Daily  . chlorhexidine  15 mL Mouth Rinse BID  . clindamycin  300 mg Oral Q8H  . donepezil  10 mg Oral QHS  . enoxaparin (LOVENOX) injection  40 mg Subcutaneous Q24H  . feeding supplement  1 Container Oral BID BM  . insulin aspart  0-9 Units Subcutaneous TID WC  . QUEtiapine  25 mg Oral Daily  . sertraline  150 mg Oral Daily  . sodium chloride  1,000 mL Intravenous Once  . sodium chloride  3 mL Intravenous Q12H  . traZODone  25 mg Oral Daily   Continuous Infusions: . dextrose 5 % and 0.45% NaCl 75 mL/hr at 07/13/12 0340    Principal Problem:   Acute renal failure Active Problems:   DM    Alzheimer's disease   Anemia in chronic kidney disease(285.21)   UTI (lower urinary tract infection)   Cellulitis and abscess of leg   Other dysphagia   Palliative care encounter    Time spent: < 25 minutes.     [facility]  [facility] Pager [phone]. If 7PM-7AM, please contact night-coverage at [email or web address], password [facility] 07/13/2012, 11:39 AM  LOS: 10 days

## 2012-07-13 NOTE — Progress Notes (Signed)
Agree with PA note.  No obstruction on KUB this am.  Will proceed.  Signed,  Sterling Big, MD Vascular & Interventional Radiologist [facility] Radiology

## 2012-07-14 DIAGNOSIS — E119 Type 2 diabetes mellitus without complications: Secondary | ICD-10-CM

## 2012-07-14 LAB — BASIC METABOLIC PANEL
BUN: 17 mg/dL (ref 6–23)
Chloride: 104 mEq/L (ref 96–112)
Creatinine, Ser: 1.44 mg/dL — ABNORMAL HIGH (ref 0.50–1.35)
Glucose, Bld: 125 mg/dL — ABNORMAL HIGH (ref 70–99)
Potassium: 3.5 mEq/L (ref 3.5–5.1)

## 2012-07-14 MED ORDER — OSMOLITE 1.2 CAL PO LIQD: 1000.0000 mL | ORAL | Status: DC

## 2012-07-14 MED ORDER — POTASSIUM CHLORIDE 2 MEQ/ML FOR ORAL USE: 20.0000 meq | Freq: Every day | ORAL | Status: DC

## 2012-07-14 MED ORDER — CEFUROXIME AXETIL 500 MG PO TABS: 250.0000 mg | ORAL_TABLET | Freq: Two times a day (BID) | ORAL | Status: AC

## 2012-07-14 MED ORDER — FREE WATER: 120.0000 mL | Status: DC

## 2012-07-14 NOTE — Progress Notes (Signed)
Report called to nurse at [facility]. Discharge reviewed and all questions answered. Julio Sicks RN

## 2012-07-14 NOTE — Discharge Summary (Signed)
Physician Discharge Summary  [name] AVW:[id number] DOB: 09/15/1945 DOA: 07/03/2012  PCP: Terald Sleeper, MD  Admit date: 07/03/2012 Discharge date: 07/14/2012  Time spent: >30 minutes  Recommendations for Outpatient Follow-up:  -Initiate Osmolite 1.2 @ 20 ml/hr via PEG and increase by 10 ml every 4 hours to goal rate of 70 ml/hr. At goal rate, tube feeding regimen will provide 2016 kcal, 93 grams of protein, and 1378 ml of H2O. Provide 120 ml free water flushes q 4 hours. -PT/OT per facility protocol -will also required palliative care and speech therapy at the facility -Diet will be full liquids to be given (only when fully awake and and in upright position); ok for sips with medications that can not be given through PEG  -BMET in 1 week to follow electrolytes and renal function  Discharge Diagnoses:  Principal Problem:   Acute renal failure Active Problems:   DM   Alzheimer's disease   Anemia in chronic kidney disease(285.21)   UTI (lower urinary tract infection)   Cellulitis and abscess of leg   Other dysphagia   Palliative care encounter   Discharge Condition: stable and improved. Will discharge to SNF with PC following at the facility.   Filed Weights   07/04/12 0200  Weight: 100.8 kg (222 lb 3.6 oz)    History of present illness:  67 yo male h/o dementia sent from SNF for fever. snf MD did cxr today which showed atelectasis, unknown if ua was done, pt given a dose of rocephin at snf. However family requested ED evaluation. Family has left ED. Pt cannot provide any history. He is pleasantly demented. He has cellulitis of his lle and uti and renal failure for which he will be admitted. No n/v/d in ED.   Hospital Course:  1-Cellulitis of LLE, ulcer big toe.  -treated with cleocin for 10 dyas -dopplers negative for DVT  -still with some swelling but erythema and pain continue improving   2. Leukocytosis due to cellulitis and UTI -WNL at discharge -Other  possibility was aspiration PNA.  -treated with 10 days of clyndamycin    3. ARF on CKD: baseline creatinine 1.4  -Component of rhabdomyolysis, dehydration and continue use of nephrotoxic agents.  -renal US: No acute urinary pathology. Mild cortical thinning bilaterally.  -good urine out put. Cr back to baseline of 1.4 at discharge -will continue lasix  -hydration to be provided through PEG  4. Hyperkalemia  -Resolved.  -Received kayexalate and insulin.  -will monitor   5. Dementia: moderately advanced  -Patient was able to engage in a conversation today.  -continue supportive care and the use of aricept and seroquel   6. DM  -continue levemir  -adjustment to be done after tube feeding initiated; further titration will be required as an outpatient.   7-E. Coli UTI, Urine grew 100-K E coli sensitive to ceftriaxone. Continue now with ceftin for 3 more days in order to finish antibiotic therapy -No fever and no dysuria.   8-Anemia: AOCD. Will monitor as an outpatient.   9-Dysphagia: Patient with severe oral pharyngeal dysphagia. Per Goals of care meeting plan is for patient to remain full code and everything done. IR consulted for PEG tube placement   10-HTN: continue home regimen.  -Rest of medical problems remains stable and the plan is to continue current medication regimen.   Procedures:  PEG tube placement on 07/13/12  LE dopplers negative for DVT/SVT  See below for x-ray reports.  Consultations:  IR  GI  PT/OT  SPT  WOC service  Discharge Exam: Filed Vitals:   07/13/12 1611 07/13/12 1655 07/13/12 2200 07/14/12 0624  BP: 148/59 147/62 136/59 146/60  Pulse: 63 64 67 65  Temp: 98.9 F (37.2 C) 98.8 F (37.1 C) 98.1 F (36.7 C) 97.6 F (36.4 C)  TempSrc: Oral Oral Oral Oral  Resp: 20 18 18    Height:      Weight:      SpO2: 96% 99% 97% 100%   General: No distress. Afebrile; able to follow simple commands; hard of hearing. AAOX2  Cardiovascular:S 1, S  2 RRR  Respiratory: Bilateral ronchus.  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  Musculoskeletal: left LE with decreases erythema, edema. Left big toe with ulcer covered with clean dressings.  Discharge Instructions  Discharge Orders   Future Orders Complete By Expires     Discharge instructions  As directed     Comments:      -Initiate Osmolite 1.2 @ 20 ml/hr via PEG and increase by 10 ml every 4 hours to goal rate of 70 ml/hr. At goal rate, tube feeding regimen will provide 2016 kcal, 93 grams of protein, and 1378 ml of H2O. Provide 120 ml free water flushes q 4 hours. -PT/OT per facility protocol -will also required palliative care and speech therapy at the facility -Diet will be full liquids to be given (only when fully awake and and in upright position); ok for sips with medications that can not be given through PEG        Medication List    STOP taking these medications       cefTRIAXone 1 G injection  Commonly known as:  ROCEPHIN     feeding supplement Liqd     metoprolol tartrate 25 MG tablet  Commonly known as:  LOPRESSOR      TAKE these medications       acetaminophen 500 MG tablet  Commonly known as:  TYLENOL  Take 500 mg by mouth every 6 (six) hours as needed for pain.     aspirin EC 81 MG tablet  Take 81 mg by mouth daily.     cefUROXime 500 MG tablet  Commonly known as:  CEFTIN  Take 0.5 tablets (250 mg total) by mouth 2 (two) times daily. Treatment for 3 more days. Stop on 07/17/12     cyanocobalamin 1000 MCG/ML injection  Commonly known as:  (VITAMIN B-12)  Inject 1,000 mcg into the muscle every 30 (thirty) days.     docusate sodium 100 MG capsule  Commonly known as:  COLACE  Take 200 mg by mouth daily.     donepezil 10 MG tablet  Commonly known as:  ARICEPT  Take 10 mg by mouth at bedtime.     feeding supplement (OSMOLITE 1.2 CAL) Liqd  Place 1,000 mLs into feeding tube continuous. Initiate  Osmolite 1.2 @ 20 ml/hr via PEG and increase by 10 ml every 4 hours to goal rate of 70 ml/hr. At goal rate, tube feeding regimen will provide 2016 kcal, 93 grams of protein, and 1378 ml of H2O. Provide 120 ml free water flushes q 4 hours.     fenofibrate 48 MG tablet  Commonly known as:  TRICOR  Take 48 mg by mouth daily.     ferrous sulfate 325 (65 FE) MG tablet  Take 325 mg by mouth daily with breakfast.     free water Soln  Place 120 mLs into  feeding tube every 4 (four) hours.     furosemide 20 MG tablet  Commonly known as:  LASIX  Take 20 mg by mouth daily.     insulin detemir 100 UNIT/ML injection  Commonly known as:  LEVEMIR  Inject 5 Units into the skin at bedtime.     multivitamin with minerals Tabs  Take 1 tablet by mouth daily.     niacin-simvastatin 500-20 MG 24 hr tablet  Commonly known as:  SIMCOR  Take 1 tablet by mouth at bedtime.     potassium chloride 2 mEq/mL Soln oral liquid  Commonly known as:  KCl  Take 10 mLs (20 mEq total) by mouth daily. Per tube     QUEtiapine 25 MG tablet  Commonly known as:  SEROQUEL  Take 25 mg by mouth daily.     sertraline 50 MG tablet  Commonly known as:  ZOLOFT  Take 150 mg by mouth daily.     tamsulosin 0.4 MG Caps  Commonly known as:  FLOMAX  Take 0.4 mg by mouth daily.     traZODone 50 MG tablet  Commonly known as:  DESYREL  Take 25 mg by mouth daily.     trolamine salicylate 10 % cream  Commonly known as:  ASPERCREME  Apply 1 application topically 2 (two) times daily. Applied to shoulder.     vitamin C 500 MG tablet  Commonly known as:  ASCORBIC ACID  Take 500 mg by mouth 2 (two) times daily.       No Known Allergies    The results of significant diagnostics from this hospitalization (including imaging, microbiology, ancillary and laboratory) are listed below for reference.    Significant Diagnostic Studies: Dg Chest 2 View  07/07/2012   *RADIOLOGY REPORT*  Clinical Data: Leukocytosis, evaluate for  aspiration pneumonia  CHEST - 2 VIEW  Comparison: 07/03/2012; 09/13/2008; chest CT - 06/05/2008  Findings:  Grossly unchanged enlarged cardiac silhouette and mediastinal contours with slight differences attributable to the decreased lung volumes.  Pulmonary vasculature is indistinct with cephalization of flow.  Worsening bibasilar heterogeneous / consolidative opacities, left greater than right.  There are at least small bilateral effusions.  No definite pneumothorax.  Unchanged bones.  IMPRESSION: Hypoventilated examination with findings most suggestive of pulmonary edema with small bilateral effusions and bibasilar opacities, left greater than right, atelectasis versus infiltrate. A follow-up chest radiograph in 4 to 6 weeks after treatment is recommended to ensure resolution.   Original Report Authenticated By: Tacey Ruiz, MD   Dg Chest 2 View  07/03/2012   *RADIOLOGY REPORT*  Clinical Data: Fever.  CHEST - 2 VIEW  Comparison: 09/13/2008  Findings: Minimal bibasilar opacities, likely atelectasis.  Heart is normal size.  No effusions.  Mediastinal contours within normal limits.  No acute bony abnormality.  IMPRESSION: Bibasilar atelectasis.   Original Report Authenticated By: Charlett Nose, M.D.   Ir Gastrostomy Tube Mod Sed  07/13/2012   *RADIOLOGY REPORT*  PULL THROUGH GASTROSTOMY TUBE PLACEMENT UNDER FLUOROSCOPIC GUIDANCE  Date: 07/13/2012  Clinical History: 67 year old male with severe dysphasia and protein calorie malnutrition  Procedures Performed:  1.  Fluoroscopically guided placement of percutaneous pull-through gastrostomy tube.  Interventional Radiologist:  Sterling Big, MD  Sedation: Moderate (conscious) sedation was used.  1.5 mg Versed, 75 mcg Fentanyl were administered intravenously.  The patient's vital signs were monitored continuously by radiology nursing throughout the procedure.  Sedation Time: 11 minutes  Fluoroscopy time: 3 minutes 18 seconds  Contrast volume: 10  ml Omnipaque-300   Antibiotics:  300 mg clindamycin was administered intravenously within 1 hour of skin incision.  PROCEDURE/FINDINGS:   Informed consent was obtained from the patient following explanation of the procedure, risks, benefits and alternatives. The patient understands, agrees and consents for the procedure. All questions were addressed. A time out was performed.  Maximal barrier sterile technique utilized including caps, mask, sterile gowns, sterile gloves, large sterile drape, hand hygiene, and chlorhexadine skin prep.  An angled catheter was advanced over a wire under fluoroscopic guidance through the nose, down the esophagus and into the body of the stomach.  The stomach was then insufflated with several 100 ml of air.  Fluoroscopy confirmed location of the gastric bubble, as well as inferior displacement of the barium stained colon.  Under direct fluoroscopic guidance, a single T-tack was placed, and the anterior gastric wall drawn up against the anterior abdominal wall. Percutaneous access was then obtained into the mid gastric body with an 18 gauge sheath needle.  Aspiration of air, and injection of contrast material under fluoroscopy confirmed needle placement.  An Amplatz wire was advanced in the gastric body and the access needle exchanged for a 9-French vascular sheath.  A snare device was advanced through the vascular sheath and an Amplatz wire advanced through the angled catheter.  The Amplatz wire was successfully snared and this was pulled up through the esophagus and out the mouth.  A 20-French Burnell Blanks MIC-PEG tube was then connected to the snare and pulled through the mouth, down the esophagus, into the stomach and out to the anterior abdominal wall. Hand injection of contrast material confirmed intragastric location. The T-tack retention suture was then cut. The pull through peg tube was then secured with the external bumper and capped.  The patient will be observed for several hours with the  newly placed tube on low wall suction to evaluate for any post procedure complication.  The patient tolerated the procedure well, there is no immediate complication.  IMPRESSION:  Successful placement of a 20 French pull through gastrostomy tube.  The tube will be ready for use at noon on 07/14/2012.  Signed,  Sterling Big, MD Vascular & Interventional Radiologist [facility]   Original Report Authenticated By: Malachy Moan, M.D.   US Renal  07/04/2012   *RADIOLOGY REPORT*  Clinical Data: Acute renal failure  RENAL/URINARY TRACT ULTRASOUND COMPLETE  Comparison:  09/06/2007  Findings:  Right Kidney:  11.5 cm in length.  Normal cortical echogenicity. No hydronephrosis or mass.  Mild cortical thinning.  Left Kidney:  11.0 cm in length.  No hydronephrosis or mass. Normal cortical echogenicity.  Mild cortical thinning.  Bladder:  Within normal limits.  IMPRESSION: No acute urinary pathology.  Mild cortical thinning bilaterally.   Original Report Authenticated By: Jolaine Click, M.D.   Dg Chest Port 1 View  07/13/2012   *RADIOLOGY REPORT*  Clinical Data: Follow up pulmonary edema  PORTABLE CHEST - 1 VIEW  Comparison: Prior chest x-ray 07/07/2012  Findings: A single frontal view of the chest demonstrates pulmonary vascular congestion and mild pulmonary edema.  There are likely trace bilateral pleural effusions.  Bibasilar retrocardiac opacities favored to reflect atelectasis.  Similar enlargement the cardiopericardial silhouette compared to prior.  Atherosclerotic calcifications noted in the transverse aorta.  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  No acute osseous abnormality.  IMPRESSION:  1.  Mild - moderate CHF similar to slightly improved compared to prior. 2.  Small bilateral pleural effusions and associated bibasilar  atelectasis.   Original Report Authenticated By: Malachy Moan, M.D.   Dg Abd Portable 1v  07/13/2012   *RADIOLOGY REPORT*  Clinical Data: Pre  gastrostomy tube placement  PORTABLE ABDOMEN - 1 VIEW  Comparison: Prior CT abdomen/pelvis 09/13/2008  Findings: Nonobstructed bowel gas pattern.  No enteric tube present.  Mild scoliosis with lower lumbar facet arthropathy.  No free air.  No acute osseous abnormality.  IMPRESSION: Nonobstructed bowel gas pattern.   Original Report Authenticated By: Malachy Moan, M.D.   Dg Swallowing Func-speech Pathology  07/08/2012   Chales Abrahams, CCC-SLP     07/08/2012  5:31 PM Objective Swallowing Evaluation: Modified Barium Swallowing Study   Patient Details  Name: [name] MRN: [id number] Date of Birth: September 10, 1945  Today's Date: 07/08/2012 Time: 1610-1650 SLP Time Calculation (min): 40 min  Past Medical History:  Past Medical History  Diagnosis Date  . Hyponatremia   . Hypokalemia   . Anemia   . Diabetes mellitus without complication   . Hypertension   . UTI (lower urinary tract infection)   . Delusional disorder   . HOH (hard of hearing)   . Hyperlipidemia   . CHF (congestive heart failure)   . Renal insufficiency   . Stroke   . Dementia   . Depression   . Dysphagia   . Falls frequently    Past Surgical History:  Past Surgical History  Procedure Laterality Date  . Colon surgery    . Colectomy    . Ileostomy    . Peg tube removal    . Cardiac catheterization    . Cholecystectomy    . Abdominal adhesion surgery     HPI:  67 yo male adm to [facility] from [facility] with cellulitis of left  lower extremity.  Pt with leukocytosis, hyperkalemia, dementia,  DM.   Pt passed an RN stroke swallow screen although he has h/o  severe dysphagia and has undergone multiple MBS studies.  Bedside  swallow evaluation and MBS completed 6/26.  MBS recommended NPO  secondary to severe oropharyngeal dysphagia and silent  aspiration.  Pt for possible PEG placement per primary team. Pt  demonstrating improved mental status today and MD requested  repeat evaluation to assess appropriateness for po intake.  After  seeing pt at bedside, MBS  repeated to allow instrumental  evaluation.    Dg Chest 2 View  07/07/2012   *RADIOLOGY REPORT*  Clinical Data: Leukocytosis,  evaluate for aspiration pneumonia  CHEST - 2 VIEW  Comparison:  07/03/2012; 09/13/2008; chest CT - 06/05/2008  Findings:  Grossly  unchanged enlarged cardiac silhouette and mediastinal contours  with slight differences attributable to the decreased lung  volumes.  Pulmonary vasculature is indistinct with cephalization  of flow.  Worsening bibasilar heterogeneous / consolidative  opacities, left greater than right.  There are at least small  bilateral effusions.  No definite pneumothorax.  Unchanged bones.   IMPRESSION: Hypoventilated examination with findings most  suggestive of pulmonary edema with small bilateral effusions and  bibasilar opacities, left greater than right, atelectasis versus  infiltrate. A follow-up chest radiograph in 4 to 6 weeks after  treatment is recommended to ensure resolution.   Original Report  Authenticated By: Tacey Ruiz, MD       Assessment / Plan / Recommendation Clinical Impression  Dysphagia Diagnosis: Moderate oral phase dysphagia;Moderate  pharyngeal phase dysphagia  Clinical impression: Pt with improved swallow function since MBS  2 days prior and he did not aspirate with  any consistency tested.   Pt does have delayed oral transiting, lingual pumping due to his  cognitive deficit and weakness.  Premature spillage of boluses  into pharynx noted with delayed reflexive pharyngeal swallow.   Tongue base and pharyngeal stasis noted across consistencies and  mixed with secretion without pt awareness, nor attempts to dry  swallow to clear.  Stasis was much worse with pudding WITHOUT pt  sensation.  Cued dry swallows conducted approx 25% of  opportunities and were helpful to decrease stasis but not fully  clear.   Mild amount of laryngeal penetration noted - even with  pt taking sequential cup sips of thin barium (feeding himself).   Pt's inability to follow  commands increases his aspiration risk,  but his ability to self feed is helpful.    Pt remains an aspiration risk with po, however at bedside his  swallow is triggered faster than during MBS.  SLP suspects  icecream provided better sensory input and pt consumption of  barium impacted testing validity.    Options to pursue po diet (full liquid - no grits, pudding) or  continue NPO.   Even if pt were to have PEG, he will still likely  aspirate secretions and asp pna is a risk for pt's with PEG. Pt  intake likely would be adequate when he is fully alert and  feeding himself based on clinical observation today.    For QOL issues *pt enjoyed eating his icecream and consuming  juice* pursuing intake with aspiration mitigation may be feasible  option.  Defer diet decision to MD due to multiple factors and  borderline test result.   Suspect pt with baseline deficits and episodic aspiration that he  manages when well.      Treatment Recommendation  Therapy as outlined in treatment plan below    Diet Recommendation Thin liquid;Nectar-thick liquid;NPO (FULL  LIQUID (no pudding or grits)  OR  NPO)   Liquid Administration via: Cup Medication Administration: Crushed with puree (crush with  icecream, follow with liquid) Supervision: Patient able to self feed;Full supervision/cueing  for compensatory strategies Compensations: Slow rate;Small sips/bites;Check for pocketing  (try to get pt to dry swallow) Postural Changes and/or Swallow Maneuvers: Seated upright 90  degrees;Upright 30-60 min after meal    Other  Recommendations Oral Care Recommendations: Oral care BID   Follow Up Recommendations  Skilled Nursing facility    Frequency and Duration min 2x/week  2 weeks   Pertinent Vitals/Pain afebrile    SLP Swallow Goals Patient will utilize recommended strategies during swallow to  increase swallowing safety with: Maximal cueing Swallow Study Goal #3 - Progress: Not met (pt cognition prevents  following directions) Goal #4: Pt's  family will determine if decide for pt to have po  with known risks with compensations to decrease aspiration risk.    Swallow Study Goal #4 - Progress: Not met (no family present)   General HPI: 67 yo male adm to [facility] from [place] with  cellulitis of left lower extremity.  Pt with leukocytosis,  hyperkalemia, dementia, DM.   Pt passed an RN stroke swallow  screen although he has h/o severe dysphagia and has undergone  multiple MBS studies.  Bedside swallow evaluation and MBS  completed 6/26.  MBS recommended NPO secondary to severe  oropharyngeal dysphagia and silent aspiration.  Pt for possible  PEG placement per primary team. Pt demonstrating improved mental  status today and MD requested repeat evaluation to assess  appropriateness for po intake.  After seeing pt at bedside, MBS  repeated to allow instrumental evaluation.  Type of Study: Modified Barium Swallowing Study Reason for Referral: Objectively evaluate swallowing function Previous Swallow Assessment: MBS two days prior  Diet Prior to this Study: NPO Temperature Spikes Noted: No Respiratory Status: Room air History of Recent Intubation: No Behavior/Cognition: Alert;Decreased sustained attention;Hard of  hearing (inconsistent ability to follow commands) Oral Cavity - Dentition: Edentulous Oral Motor / Sensory Function: Impaired - see Bedside swallow  eval Self-Feeding Abilities: Able to feed self Patient Positioning: Upright in chair Baseline Vocal Quality: Low vocal intensity;Breathy Volitional Cough: Cognitively unable to elicit Volitional Swallow: Able to elicit Anatomy: Within functional limits Pharyngeal Secretions: Standing secretions in (comment) (pharynx  mixed with barium)    Reason for Referral Objectively evaluate swallowing function   Oral Phase Oral - Nectar Oral - Nectar Teaspoon: Weak lingual manipulation;Delayed oral  transit;Lingual pumping;Holding of bolus Oral - Nectar Cup: Delayed oral transit;Weak lingual  manipulation;Lingual  pumping;Holding of bolus Oral - Thin Oral - Thin Teaspoon: Lingual pumping;Holding of bolus;Weak  lingual manipulation;Delayed oral transit Oral - Thin Cup: Delayed oral transit;Weak lingual  manipulation;Lingual pumping;Holding of bolus Oral - Solids Oral - Puree: Delayed oral transit;Lingual pumping;Weak lingual  manipulation   Pharyngeal Phase Pharyngeal - Nectar Pharyngeal - Nectar Teaspoon: Delayed swallow  initiation;Premature spillage to valleculae;Reduced laryngeal  elevation;Reduced airway/laryngeal closure;Reduced tongue base  retraction;Pharyngeal residue - valleculae;Pharyngeal residue -  pyriform sinuses;Lateral channel residue Pharyngeal - Nectar Cup: Premature spillage to valleculae;Delayed  swallow initiation;Reduced epiglottic inversion;Reduced anterior  laryngeal mobility;Reduced pharyngeal peristalsis;Reduced tongue  base retraction;Pharyngeal residue - valleculae;Pharyngeal  residue - pyriform sinuses;Penetration/Aspiration during  swallow;Lateral channel residue;Reduced laryngeal  elevation;Reduced airway/laryngeal closure Penetration/Aspiration details (nectar cup): Material enters  airway, remains ABOVE vocal cords and not ejected out Pharyngeal - Thin Pharyngeal - Thin Teaspoon: Premature spillage to  valleculae;Delayed swallow initiation;Lateral channel  residue;Pharyngeal residue - pyriform sinuses;Pharyngeal residue  - valleculae;Reduced anterior laryngeal mobility;Reduced  epiglottic inversion;Reduced pharyngeal peristalsis;Reduced  laryngeal elevation;Reduced airway/laryngeal closure;Reduced  tongue base retraction Pharyngeal - Thin Cup: Delayed swallow initiation;Premature  spillage to valleculae;Penetration/Aspiration during swallow Penetration/Aspiration details (thin cup): Material enters  airway, remains ABOVE vocal cords and not ejected out Pharyngeal - Solids Pharyngeal - Puree: Delayed swallow initiation;Premature spillage  to valleculae;Reduced pharyngeal peristalsis;Reduced  epiglottic  inversion;Reduced anterior laryngeal mobility;Reduced laryngeal  elevation;Reduced airway/laryngeal closure;Reduced tongue base  retraction;Pharyngeal residue - valleculae;Pharyngeal residue -  pyriform sinuses;Lateral channel residue;Compensatory strategies  attempted (Comment) (mixed with secretions) Penetration/Aspiration details (puree): Material does not enter  airway  Cervical Esophageal Phase    GO    Cervical Esophageal Phase Cervical Esophageal Phase: Impaired Cervical Esophageal Phase - Nectar Nectar Teaspoon: Reduced cricopharyngeal relaxation Cervical Esophageal Phase - Thin Thin Teaspoon: Reduced cricopharyngeal relaxation Thin Cup: Reduced cricopharyngeal relaxation Cervical Esophageal Phase - Solids Puree: Reduced cricopharyngeal relaxation Cervical Esophageal Phase - Comment Cervical Esophageal Comment: appearance of minimal amount of  stasis at distal region with esophageal sweep x1, decreased ues  opening results in increase pyriform sinus stasis         Donavan Burnet, MS [facility] SLP [phone]    Dg Swallowing Func-speech Pathology  07/06/2012   Patient Information    Patient Name Sex DOB SSN    [name], [name] Male [birth year] [id number]       Procedures signed by Gray Bernhardt, CCC-SLP at 07/06/2012  2:22 PM    Author: Gray Bernhardt, CCC-SLP Service: Acute Care Author Type: Speech and Language Pathologist  Filed: 07/06/2012  2:22 PM Note Time: 07/06/2012  2:21 PM           Procedure Orders:    1. SLP modified barium swallow [[id number]] ordered by Alba Cory, MD at 07/05/12 1512       Procedures    1. SLP MODIFIED BARIUM SWALLOW [[id number]]       Objective Swallowing Evaluation: Modified Barium Swallowing Study  Patient Details   Name: [name] MRN: [id number] Date of Birth: [birth year]   Today's Date: 07/06/2012 Time: 1340-1410 SLP Time Calculation (min): 30 min   Past Medical History:  Past Medical History   Diagnosis  Date   .  Hyponatremia     .  Hypokalemia     .  Anemia      .  Diabetes mellitus without complication     .  Hypertension     .  UTI (lower urinary tract infection)     .  Delusional disorder     .  HOH (hard of hearing)     .  Hyperlipidemia     .  CHF (congestive heart failure)     .  Renal insufficiency     .  Stroke     .  Dementia     .  Depression     .  Dysphagia     .  Falls frequently        Past Surgical History:  Past Surgical History   Procedure  Laterality  Date   .  Colon surgery       .  Colectomy       .  Ileostomy       .  Peg tube removal       .  Cardiac catheterization       .  Cholecystectomy       .  Abdominal adhesion surgery          HPI:   67 yo male adm to [facility] from [facility] with cellulitis of left lower extremity.  Pt with leukocytosis, hyperkalemia, dementia, DM.   Pt passed an RN stroke swallow screen although he has h/o severe dysphagia and has undergone multiple MBS studies.  Bedside swallow evaluation recommended NPO except meds, with repeat MBS to objectively assess swallow function and safety.       Assessment / Plan / Recommendation Clinical Impression   Dysphagia Diagnosis: Severe pharyngeal phase dysphagia;Severe oral phase dysphagia Clinical impression: Pt exhibits extended oral holding with posterior spill to vallecula/pyriform and airway.  No swallow reflex was elicited, and no cough response was observed following aspiration.  Voice quality was wet, and pt unable to follow directions to clear throat or cough.  Pt would not open mouth sufficiently for oral suctioning to remove barium from oral cavity.  Pt is currently inappropriate for any po intake, including medications.       Treatment Recommendation   Therapy as outlined in treatment plan below      Diet Recommendation  NPO    Medication Administration: Via alternative means      Other  Recommendations   Consider nonoral means of nutrition/hydration and medication.   Follow Up Recommendations   Skilled Nursing facility     Frequency and Duration  min 1 x/week   1 week     Pertinent Vitals/Pain  None indicated       SLP Swallow Goals Goal #3: Pt will cough on command and clear secretions with max assistance  to aid airway protection and help determine readiness for MBS.    Swallow Study Goal #3 - Progress: Progressing toward goal      General  Date of Onset: 07/05/12 HPI: 67 yo male adm to [facility] from [facility] with cellulitis of left lower extremity.  Pt with leukocytosis, hyperkalemia, dementia, DM.   Pt passed an RN stroke swallow screen although he has h/o severe dysphagia and has undergone multiple MBS studies.  Bedside swallow evaluation recommended NPO except meds, with repeat MBS to objectively assess swallow function and safety. Type of Study: Modified Barium Swallowing Study Reason for Referral: Objectively evaluate swallowing function Previous Swallow Assessment: BSE Wednesday 07/05/12 Diet Prior to this Study: NPO Temperature Spikes Noted: No Respiratory Status: Room air History of Recent Intubation: No Behavior/Cognition: Alert;Doesn't follow directions;Decreased sustained attention;Requires cueing Oral Cavity - Dentition: Edentulous Patient Positioning: Upright in chair Baseline Vocal Quality: Low vocal intensity;Wet Volitional Cough: Cognitively unable to elicit Volitional Swallow: Unable to elicit Anatomy: Within functional limits Pharyngeal Secretions: Not observed secondary MBS      Reason for Referral  Objectively evaluate swallowing function    Oral Phase  Oral Preparation/Oral Phase Oral Phase: Impaired Oral - Thin Oral - Thin Cup: Lingual pumping;Holding of bolus;Reduced posterior propulsion;Delayed oral transit Oral - Solids Oral - Puree: Lingual pumping;Holding of bolus;Reduced posterior propulsion    Pharyngeal Phase  Pharyngeal Phase Pharyngeal Phase: Impaired Pharyngeal - Thin Pharyngeal - Thin Cup: Delayed swallow initiation;Premature spillage to valleculae;Premature spillage to pyriform sinuses;Penetration/Aspiration before swallow;Reduced airway/laryngeal  closure Penetration/Aspiration details (thin cup): Material enters airway, passes BELOW cords without attempt by patient to eject out (silent aspiration) Pharyngeal - Solids Pharyngeal - Puree: Premature spillage to valleculae;Premature spillage to pyriform sinuses;Reduced airway/laryngeal closure;Delayed swallow initiation;Penetration/Aspiration before swallow Penetration/Aspiration details (puree): Material enters airway, passes BELOW cords without attempt by patient to eject out (silent aspiration)   Cervical Esophageal Phase             Celia B. [place], [facility], CCC-SLP [phone]                   Leigh Aurora 07/06/2012, 2:21 PM      Microbiology: Recent Results (from the past 240 hour(s))  CLOSTRIDIUM DIFFICILE BY PCR     Status: None   Collection Time    07/06/12 11:22 AM      Result Value Range Status   C difficile by pcr NEGATIVE  NEGATIVE Final     Labs: Basic Metabolic Panel:  Recent Labs Lab 07/10/12 0440 07/11/12 0530 07/12/12 0500 07/13/12 0503 07/14/12 0455  NA 142 142 138 137 137  K 3.4* 3.6 3.4* 3.4* 3.5  CL 111 111 106 106 104  CO2 20 22 21 22 22   GLUCOSE 138* 113* 110* 129* 125*  BUN 31* 28* 24* 20 17  CREATININE 1.89* 1.73* 1.52* 1.46* 1.44*  CALCIUM 9.1 9.1 9.0 8.9 8.9   CBC:  Recent Labs Lab 07/09/12 1026 07/10/12 0440 07/11/12 0530 07/12/12 0500 07/13/12 0503  WBC 9.8 8.4 9.8 10.4 9.1  HGB 8.5* 8.1* 8.4* 8.8* 8.0*  HCT 25.4* 24.4* 24.9* 26.1* 23.8*  MCV 77.2* 77.2* 77.3* 77.7* 77.3*  PLT 180 187 193 228 228   CBG:  Recent Labs Lab 07/13/12 0752 07/13/12 1213 07/13/12 1651 07/13/12 2228 07/14/12 1133  GLUCAP 119* 106* 109* 101* 94    Signed:  Blease Capaldi  [facility] 07/14/2012, 11:43 AM

## 2012-07-14 NOTE — Progress Notes (Signed)
Subjective: Perc G tube placed 7/3 Doing well pleasant  Objective: Vital signs in last 24 hours: Temp:  [97.6 F (36.4 C)-98.9 F (37.2 C)] 97.6 F (36.4 C) (07/04 0624) Pulse Rate:  [55-67] 65 (07/04 0624) Resp:  [14-24] 18 (07/03 2200) BP: (125-155)/(57-79) 146/60 mmHg (07/04 0624) SpO2:  [95 %-100 %] 100 % (07/04 0624) Last BM Date: 07/14/12  Intake/Output from previous day: 07/03 0701 - 07/04 0700 In: 1186.3 [I.V.:916.3; NG/GT:120; IV Piggyback:150] Out: 2100 [Urine:1275; Stool:825] Intake/Output this shift: Total I/O In: -  Out: 200 [Stool:200]  PE: afeb; vss G tube intact NT; no bleeding +BS   Lab Results:   Recent Labs  07/12/12 0500 07/13/12 0503  WBC 10.4 9.1  HGB 8.8* 8.0*  HCT 26.1* 23.8*  PLT 228 228   BMET  Recent Labs  07/13/12 0503 07/14/12 0455  NA 137 137  K 3.4* 3.5  CL 106 104  CO2 22 22  GLUCOSE 129* 125*  BUN 20 17  CREATININE 1.46* 1.44*  CALCIUM 8.9 8.9   PT/INR  Recent Labs  07/13/12 0503  LABPROT 15.3*  INR 1.24   ABG No results found for this basename: PHART, PCO2, PO2, HCO3,  in the last 72 hours  Studies/Results: Ir Gastrostomy Tube Mod Sed  07/13/2012   *RADIOLOGY REPORT*  PULL THROUGH GASTROSTOMY TUBE PLACEMENT UNDER FLUOROSCOPIC GUIDANCE  Date: 07/13/2012  Clinical History: 67 year old male with severe dysphasia and protein calorie malnutrition  Procedures Performed:  1.  Fluoroscopically guided placement of percutaneous pull-through gastrostomy tube.  Interventional Radiologist:  Sterling Big, MD  Sedation: Moderate (conscious) sedation was used.  1.5 mg Versed, 75 mcg Fentanyl were administered intravenously.  The patient's vital signs were monitored continuously by radiology nursing throughout the procedure.  Sedation Time: 11 minutes  Fluoroscopy time: 3 minutes 18 seconds  Contrast volume: 10 ml Omnipaque-300  Antibiotics:  300 mg clindamycin was administered intravenously within 1 hour of skin  incision.  PROCEDURE/FINDINGS:   Informed consent was obtained from the patient following explanation of the procedure, risks, benefits and alternatives. The patient understands, agrees and consents for the procedure. All questions were addressed. A time out was performed.  Maximal barrier sterile technique utilized including caps, mask, sterile gowns, sterile gloves, large sterile drape, hand hygiene, and chlorhexadine skin prep.  An angled catheter was advanced over a wire under fluoroscopic guidance through the nose, down the esophagus and into the body of the stomach.  The stomach was then insufflated with several 100 ml of air.  Fluoroscopy confirmed location of the gastric bubble, as well as inferior displacement of the barium stained colon.  Under direct fluoroscopic guidance, a single T-tack was placed, and the anterior gastric wall drawn up against the anterior abdominal wall. Percutaneous access was then obtained into the mid gastric body with an 18 gauge sheath needle.  Aspiration of air, and injection of contrast material under fluoroscopy confirmed needle placement.  An Amplatz wire was advanced in the gastric body and the access needle exchanged for a 9-French vascular sheath.  A snare device was advanced through the vascular sheath and an Amplatz wire advanced through the angled catheter.  The Amplatz wire was successfully snared and this was pulled up through the esophagus and out the mouth.  A 20-French Burnell Blanks MIC-PEG tube was then connected to the snare and pulled through the mouth, down the esophagus, into the stomach and out to the anterior abdominal wall. Hand injection of contrast material confirmed intragastric location.  The T-tack retention suture was then cut. The pull through peg tube was then secured with the external bumper and capped.  The patient will be observed for several hours with the newly placed tube on low wall suction to evaluate for any post procedure complication.   The patient tolerated the procedure well, there is no immediate complication.  IMPRESSION:  Successful placement of a 20 French pull through gastrostomy tube.  The tube will be ready for use at noon on 07/14/2012.  Signed,  Sterling Big, MD Vascular & Interventional Radiologist [facility]   Original Report Authenticated By: Malachy Moan, M.D.   Dg Chest Port 1 View  07/13/2012   *RADIOLOGY REPORT*  Clinical Data: Follow up pulmonary edema  PORTABLE CHEST - 1 VIEW  Comparison: Prior chest x-ray 07/07/2012  Findings: A single frontal view of the chest demonstrates pulmonary vascular congestion and mild pulmonary edema.  There are likely trace bilateral pleural effusions.  Bibasilar retrocardiac opacities favored to reflect atelectasis.  Similar enlargement the cardiopericardial silhouette compared to prior.  Atherosclerotic calcifications noted in the transverse aorta.  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  No acute osseous abnormality.  IMPRESSION:  1.  Mild - moderate CHF similar to slightly improved compared to prior. 2.  Small bilateral pleural effusions and associated bibasilar atelectasis.   Original Report Authenticated By: Malachy Moan, M.D.   Dg Abd Portable 1v  07/13/2012   *RADIOLOGY REPORT*  Clinical Data: Pre gastrostomy tube placement  PORTABLE ABDOMEN - 1 VIEW  Comparison: Prior CT abdomen/pelvis 09/13/2008  Findings: Nonobstructed bowel gas pattern.  No enteric tube present.  Mild scoliosis with lower lumbar facet arthropathy.  No free air.  No acute osseous abnormality.  IMPRESSION: Nonobstructed bowel gas pattern.   Original Report Authenticated By: Malachy Moan, M.D.    Anti-infectives:   Assessment/Plan: s/p * No surgery found *  Perc G tube placed 7/3 May use now   LOS: 11 days    Kemaya Dorner A 07/14/2012

## 2012-07-14 NOTE — Progress Notes (Signed)
Clinical Social Work  CSW faxed DC summary to [employer] and SNF is ready to accept patient. CSW prepared DC packet with FL2 included. Patient and RN aware of DC and transportation coordinated via [facility]. Request # [id number]. CSW is signing off but available if needed.  Unk Lightning, LCSW (Coverage for [employer])

## 2012-07-17 ENCOUNTER — Non-Acute Institutional Stay (SKILLED_NURSING_FACILITY): Payer: Medicare Other | Admitting: Internal Medicine

## 2012-07-17 DIAGNOSIS — D631 Anemia in chronic kidney disease: Secondary | ICD-10-CM

## 2012-07-17 DIAGNOSIS — G309 Alzheimer's disease, unspecified: Secondary | ICD-10-CM

## 2012-07-17 DIAGNOSIS — E1129 Type 2 diabetes mellitus with other diabetic kidney complication: Secondary | ICD-10-CM

## 2012-07-17 DIAGNOSIS — F0280 Dementia in other diseases classified elsewhere without behavioral disturbance: Secondary | ICD-10-CM

## 2012-07-17 DIAGNOSIS — N189 Chronic kidney disease, unspecified: Secondary | ICD-10-CM

## 2012-07-26 NOTE — Progress Notes (Signed)
Patient ID: [name], male   DOB: 09/29/1945, 67 y.o.   MRN: [id number]        PROGRESS NOTE  DATE: 07/03/2012  FACILITY:  [facility]  LEVEL OF CARE: SNF (31)  Acute Visit  CHIEF COMPLAINT:  Manage weakness, labored breathing.  HISTORY OF PRESENT ILLNESS: I was requested by the staff to assess the patient regarding above problem(s):  Staff report that patient was in the bathroom and was not able to get up.  He was having slightly labored breathing.  Patient had been weak.  Staff put him in the bed.  He is overall a poor historian.  Staff cannot identify precipitating or alleviating factors.    PAST MEDICAL HISTORY : Reviewed.  No changes.  CURRENT MEDICATIONS: Reviewed per [facility]  REVIEW OF SYSTEMS:  Unobtainable.  Patient is a poor historian.   PHYSICAL EXAMINATION  VS:  T 99.2       P 96      RR 22      BP 90/50     POX %       WT (Lb)  GENERAL: patient is having labored breathing, moderately obese body habitus EYES: conjunctivae normal, sclerae normal, normal eye lids NECK: supple, trachea midline, no neck masses, no thyroid tenderness, no thyromegaly LYMPHATICS: no LAN in the neck, no supraclavicular LAN RESPIRATORY: bilateral diffuse rhonchi CARDIAC: RRR, no murmur,no extra heart sounds EDEMA/VARICOSITIES:  +2 bilateral lower extremity edema  ARTERIAL:  pedal pulses nonpalpable   GI: abdomen soft, normal BS, no masses, no tenderness, no hepatomegaly, no splenomegaly PSYCHIATRIC: the patient is alert & oriented to person, affect & behavior appropriate MUSCULOSKELETAL: unable to assess, patient not following commands NEUROLOGICAL: patient is alert but overall not responding to commands, he is somewhat lethargic  ASSESSMENT/PLAN:  Probable sepsis.  Likely secondary to pneumonia.  Patient is unstable.  New onset.  Significant problem.  Obtain stat chest x-ray.  Give Rocephin 1 g IM one-time dose.  Start Avelox 400 mg q.d. for six more days, starting  tomorrow.  Use albuterol nebs b.i.d. for three days.  Start probiotics b.i.d. for 10 days.    Hypotension.  New problem.  Request to push fluids.    CPT CODE: 78469

## 2012-07-26 NOTE — Consult Note (Signed)
I have reviewed and discussed the care of this patient in detail with the nurse practitioner including pertinent patient records, physical exam findings and data. I agree with details of this encounter.  

## 2012-08-13 NOTE — Progress Notes (Signed)
Patient ID: [name], male   DOB: 1945-10-30, 67 y.o.   MRN: [id number]        HISTORY & PHYSICAL  DATE: 07/17/2012   FACILITY: [facility]  LEVEL OF CARE: SNF (31)  ALLERGIES:  No Known Allergies  CHIEF COMPLAINT:  Manage Alzheimer's dementia, diabetes mellitus, and chronic kidney disease.    HISTORY OF PRESENT ILLNESS:  The patient is a 67 year-old, African-American male who was hospitalized secondary to acute renal failure and left lower extremity cellulitis.  After hospitalization, he is readmitted back to this facility for long-term care.  He has the following problems:    DEMENTIA: The dementia remains stable and continues to function adequately in the current living environment with supervision.  The patient has had little changes in behavior. No complications noted from the medications presently being used.  Patient is a poor historian.    DM:pt's DM remains stable.  Pt denies polyuria, polydipsia, polyphagia, changes in vision or hypoglycemic episodes.  No complications noted from the medication presently being used.  Last hemoglobin A1c is:  Not available.   CHRONIC KIDNEY DISEASE: The patient's chronic kidney disease remains stable.  Patient denies increasing lower extremity swelling or confusion. Last BUN and creatinine are:  Last creatinine 1.4 at discharge.    PAST MEDICAL HISTORY :  Past Medical History  Diagnosis Date  . Hyponatremia   . Hypokalemia   . Anemia   . Diabetes mellitus without complication   . Hypertension   . UTI (lower urinary tract infection)   . Delusional disorder   . HOH (hard of hearing)   . Hyperlipidemia   . CHF (congestive heart failure)   . Renal insufficiency   . Stroke   . Dementia   . Depression   . Dysphagia   . Falls frequently     PAST SURGICAL HISTORY: Past Surgical History  Procedure Laterality Date  . Colon surgery    . Colectomy    . Ileostomy    . Peg tube removal    . Cardiac catheterization     . Cholecystectomy    . Abdominal adhesion surgery      SOCIAL HISTORY:  reports that he has never smoked. He does not have any smokeless tobacco history on file. He reports that he does not drink alcohol or use illicit drugs.  FAMILY HISTORY: None  CURRENT MEDICATIONS: Reviewed per [facility]  REVIEW OF SYSTEMS:  Unobtainable due to dementia.    PHYSICAL EXAMINATION  VS:  T 97.6       P 65      RR 18      BP       POX%        WT (Lb) 222  GENERAL: no acute distress, normal body habitus EYES: conjunctivae normal, sclerae normal, normal eye lids MOUTH/THROAT: lips without lesions,no lesions in the mouth,tongue is without lesions,uvula elevates in midline NECK: supple, trachea midline, no neck masses, no thyroid tenderness, no thyromegaly LYMPHATICS: no LAN in the neck, no supraclavicular LAN RESPIRATORY: breathing is even & unlabored, BS CTAB CARDIAC: RRR, no murmur,no extra heart sounds EDEMA/VARICOSITIES:  +3 bilateral lower extremity edema  ARTERIAL:  pedal pulses nonpalpable   GI:  ABDOMEN: abdomen soft, bowel sounds diminished, no masses, no tenderness, patient has a PEG and right lower quadrant colostomy  LIVER/SPLEEN: no hepatomegaly, no splenomegaly MUSCULOSKELETAL: HEAD: normal to inspection & palpation BACK: no kyphosis, scoliosis or spinal processes tenderness EXTREMITIES: LEFT UPPER EXTREMITY:  strength unable to assess, range of motion normal RIGHT UPPER EXTREMITY: strength unable to assess, range of motion normal  LEFT LOWER EXTREMITY: strength unable to assess, range of motion normal  RIGHT LOWER EXTREMITY: strength unable to assess, range of motion normal  PSYCHIATRIC: the patient is alert & oriented to person, affect & behavior appropriate  LABS/RADIOLOGY: Chest x-ray:  Pulmonary edema with small bilateral pleural effusions.    Renal ultrasound:  No acute renal pathology.    Abdominal x-ray:  Nonobstructive bowel gas pattern.    C.difficile by PCR negative.     Glucose 125, creatinine 1.44, otherwise BMP normal.    Hemoglobin 8, MCV 77.3, otherwise CBC normal.    ASSESSMENT/PLAN:  Alzheimer's dementia.  Advanced.  Continue current medications.    Diabetes mellitus with renal complications.  Continue current medications.    Chronic kidney disease.  Reassess renal functions.   Anemia of chronic kidney disease.  Reassess hemoglobin level.    Renovascular hypertension.  Request blood pressure charting.    Dysphagia.  Patient has severe oropharyngeal dysphagia.  Status post PEG tube placement.    Vitamin B12 deficiency.  Continue supplementation.    Hyperlipidemia.  Continue Simcor.    BPH.  Continue Flomax.    Insomnia.  Continue trazodone.    Check CBC and BMP.    I have reviewed patient's medical records received at admission/from hospitalization.  CPT CODE: 40981

## 2012-08-14 DIAGNOSIS — N189 Chronic kidney disease, unspecified: Secondary | ICD-10-CM | POA: Insufficient documentation

## 2012-08-23 ENCOUNTER — Non-Acute Institutional Stay (SKILLED_NURSING_FACILITY): Payer: Medicare Other | Admitting: Internal Medicine

## 2012-08-23 DIAGNOSIS — D631 Anemia in chronic kidney disease: Secondary | ICD-10-CM

## 2012-08-23 DIAGNOSIS — E1129 Type 2 diabetes mellitus with other diabetic kidney complication: Secondary | ICD-10-CM

## 2012-08-23 DIAGNOSIS — F0280 Dementia in other diseases classified elsewhere without behavioral disturbance: Secondary | ICD-10-CM

## 2012-08-23 DIAGNOSIS — I150 Renovascular hypertension: Secondary | ICD-10-CM

## 2012-08-25 NOTE — Progress Notes (Signed)
Patient ID: [name], male   DOB: 1945/05/12, 67 y.o.   MRN: [id number]        PROGRESS NOTE  DATE: 08/23/2012  FACILITY: Nursing Home Location: [facility]  LEVEL OF CARE: SNF (31)  Routine Visit  CHIEF COMPLAINT:  Manage anemia of chronic kidney disease, diabetes mellitus, and dementia.    HISTORY OF PRESENT ILLNESS:  REASSESSMENT OF ONGOING PROBLEM(S):  DEMENTIA: The dementia remains stable and continues to function adequately in the current living environment with supervision.  The patient has had little changes in behavior. No complications noted from the medications presently being used.  Patient is a poor historian.    ANEMIA: The anemia has been stable. The patient denies fatigue, melena or hematochezia. No complications from the medications currently being used.  The anemia is secondary to chronic kidney disease.  On 05/09/2012, hemoglobin was 9.9, MCV 80.  On 04/24/2012, hemoglobin was 9.8, in 7/14 hemoglobin 8.6, MCV 80.  DM:pt's DM remains stable.  Pt denies polyuria, polydipsia, polyphagia, changes in vision or hypoglycemic episodes.  No complications noted from the medication presently being used.  Last hemoglobin A1c is:  04/2012:  Hemoglobin A1C 6.7, in 7/14 hemoglobin A1c 6.6.  PAST MEDICAL HISTORY : Reviewed.  No changes.  CURRENT MEDICATIONS: Reviewed per [facility]  REVIEW OF SYSTEMS:  Difficult to obtain due to dementia.    PHYSICAL EXAMINATION  VS:  T 97      P 66     RR 18    BP 140/60     POX %     WT (Lb) 216  GENERAL: no acute distress, normal body habitus EYES: Normal sclerae, normal conjunctivae, no discharge NECK: supple, trachea midline, no neck masses, no thyroid tenderness, no thyromegaly LYMPHATICS: No cervical lymphadenopathy, no supraclavicular lymphadenopathy RESPIRATORY: breathing is even & unlabored, BS CTAB CARDIAC: RRR, no murmur,no extra heart sounds EDEMA/VARICOSITIES:  +1 bilateral lower extremity edema  ARTERIAL:  pedal pulses nonpalpable  GI: abdomen soft, normal BS, no masses, no tenderness, no hepatomegaly, no splenomegaly PSYCHIATRIC: the patient is alert & oriented to person, affect & behavior appropriate  LABS/RADIOLOGY:  7/14 hemoglobin 8.6, MCV 80, platelets 573, WBC 5.4, glucose 140, BUN 42, creatinine 1.61 otherwise BMP normal 04/2012:  Hemoglobin 9.9, MCV 80, otherwise CBC normal.    Creatinine 1.62, otherwise CMP normal.    Fasting lipid panel normal.    ASSESSMENT/PLAN:  Diabetes mellitus with renal complications.  Well controlled.  Anemia of chronic kidney disease.  Stable.   Dementia.  Moderately advanced.  Aricept was DC'd.  Renovascular hypertension. Uncontrolled. Start Norvasc 5 mg daily  Hyperlipidemia.  Well controlled.    BPH.  Flomax was discontinued.    Insomnia.  Continue trazodone.    Vitamin B12 deficiency.  Continue supplementation.   CPT CODE: 40981

## 2012-09-22 ENCOUNTER — Non-Acute Institutional Stay (SKILLED_NURSING_FACILITY): Payer: Medicare Other | Admitting: Internal Medicine

## 2012-09-22 DIAGNOSIS — T8183XA Persistent postprocedural fistula, initial encounter: Secondary | ICD-10-CM

## 2012-09-22 NOTE — Progress Notes (Signed)
Patient ID: [name], male   DOB: [birth year], 67 y.o.   MRN: [id number] Facility; [facility] Chief complaint; increasing drainage from an open area in the left lower quadrant of his abdomen. History; this is a patient who has been under my name although I have not seen him in a number of years. I was asked to look at it is open area in the left lower quadrant due to increasing amounts of foul-smelling drainage. The history provided by the staff is that normally this has a scant amount of drainage to no drainage. It is simply manage with the covering dressing. Over the last 48 hours the amount of drug drainage has increased accompanied by a very foul odor.  I have taken the opportunity to review his history. The patient has a history of colon cancer and had a subtotal colectomy. He has an ileostomy. 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 I could not really see definitive information about where the actual connection occurs although I am assuming that this may be a connection between the skin and his colonic remanent.  leukocytosis, abdominal pain. History of colon cancer.    CT ABDOMEN AND PELVIS WITHOUT CONTRAST    Technique:  Multidetector CT imaging of the abdomen and pelvis was performed following the standard protocol without intravenous contrast.    Comparison:  06/05/2008 and earlier.    CT ABDOMEN    Findings:  New marked soft tissue thickening circumferentially involving the visualized distal thoracic esophagus.  This does not appear to extend to the gastroesophageal junction.  Percutaneous gastrostomy tube in place.  Stomach otherwise within normal limits. Respiratory motion artifact at the lung bases with mild atelectasis.  Calcified atherosclerosis. Stable visualized osseous structures.  Lower lumbar disc protrusions.  Gallbladder is surgically absent.   Noncontrast appearance of the liver is within normal limits.  Spleen, pancreas, and adrenal glands are stable within normal limits.  Lobulated appearance of the kidneys is stable.  Nonobstructing nephrolithiasis.  Scattered sub centimeter low density exophytic renal lesions appear stable.    No free fluid or free air.  Duodenum and proximal jejunal loops are decompressed.  There are multiple mildly dilated mid small bowel loops occupying most of the abdomen.  There is a relative transition point in the right abdomen just above the level of the right lower quadrant ileostomy.  Distal small bowel loops have an indistinct wall and there is mild mesenteric stranding.  There is a mucous fistula in the left lower quadrant.  No mesenteric mass identified.    IMPRESSION:    1.  Partial or early complete small bowel obstruction with transition point in the right lower abdomen near the ileostomy. Haziness of distal small bowel loops may represent reactive inflammation or infection (enteritis). 2.  New circumferential wall thickening of the distal cervical esophagus up to 10 mm.  Differential considerations include esophagitis and esophageal neoplasm. 3.  Nephrolithiasis.    CT PELVIS    Findings:  Mucous fistula the left lower quadrant with decompressed distal colon.  No free fluid.  Stable mild diffuse bladder wall thickening.  Calcified atherosclerosis. Stable visualized osseous structures.  Inguinal lymph nodes appear stable to decreased since the previous exam.    IMPRESSION:  No acute findings identified in the pelvis.  Physical examination; Abdomen; patient has a feeding tube in place as well as  an ileostomy in the right side. In the mid aspect of the left lower quadrant there is a clean open area. This probes roughly 2 inches. The mucosa does not look too bad from what I can visualize. 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 There is no tenderness. Liver and spleen are not palpable Cardiac heart sounds are normal he appears to be euvolemic.  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 Increased drainage may be drainage from his colonic remanent. An abscess or infection would also seem possible but less likely. I have deeply cultured the area and will practice with a silver alginate or Aquacel Ag. I will see about whether this will require further imaging of his pelvis which doesn't seem to of been done since 2010. It is also possible that we will simply need to apply an ostomy over this site if the drainage continues and there doesn't seem to be an alternative explanation

## 2012-10-02 ENCOUNTER — Non-Acute Institutional Stay (SKILLED_NURSING_FACILITY): Payer: Medicare Other | Admitting: Internal Medicine

## 2012-10-02 DIAGNOSIS — D631 Anemia in chronic kidney disease: Secondary | ICD-10-CM

## 2012-10-02 DIAGNOSIS — I150 Renovascular hypertension: Secondary | ICD-10-CM

## 2012-10-02 DIAGNOSIS — E1129 Type 2 diabetes mellitus with other diabetic kidney complication: Secondary | ICD-10-CM

## 2012-10-02 DIAGNOSIS — F0280 Dementia in other diseases classified elsewhere without behavioral disturbance: Secondary | ICD-10-CM

## 2012-10-02 NOTE — Progress Notes (Signed)
Patient ID: [name], male   DOB: [birth year], 67 y.o.   MRN: [id number]        PROGRESS NOTE  DATE: 10/02/2012  FACILITY: Nursing Home Location: [facility]  LEVEL OF CARE: SNF (31)  Routine Visit  CHIEF COMPLAINT:  Manage anemia of chronic kidney disease, diabetes mellitus, and dementia.    HISTORY OF PRESENT ILLNESS:  REASSESSMENT OF ONGOING PROBLEM(S):  DEMENTIA: The dementia remains stable and continues to function adequately in the current living environment with supervision.  The patient has had little changes in behavior. No complications noted from the medications presently being used.  Patient is a poor historian.    ANEMIA: The anemia has been stable. The patient denies fatigue, melena or hematochezia. No complications from the medications currently being used.  The anemia is secondary to chronic kidney disease.  On 05/09/2012, hemoglobin was 9.9, MCV 80.  On 04/24/2012, hemoglobin was 9.8, in 7/14 hemoglobin 8.6, MCV 80.  DM:pt's DM remains stable.  Pt denies polyuria, polydipsia, polyphagia, changes in vision or hypoglycemic episodes.  No complications noted from the medication presently being used.  Last hemoglobin A1c is:  04/2012:  Hemoglobin A1C 6.7, in 7/14 hemoglobin A1c 6.6.  PAST MEDICAL HISTORY : Reviewed.  No changes.  CURRENT MEDICATIONS: Reviewed per [facility]  REVIEW OF SYSTEMS:  Difficult to obtain due to dementia.    PHYSICAL EXAMINATION  VS:  T 97.5      P 76     RR 18    BP 166/54     POX %     WT (Lb) 219.9  GENERAL: no acute distress, normal body habitus NECK: supple, trachea midline, no neck masses, no thyroid tenderness, no thyromegaly RESPIRATORY: breathing is even & unlabored, BS CTAB CARDIAC: RRR, no murmur,no extra heart sounds EDEMA/VARICOSITIES:  +2 bilateral lower extremity edema  ARTERIAL: pedal pulses nonpalpable  GI: abdomen soft, normal BS, no masses, no tenderness, no hepatomegaly, no splenomegaly PSYCHIATRIC: the  patient is alert & oriented to person, affect & behavior appropriate  LABS/RADIOLOGY:  7/14 hemoglobin 8.6, MCV 80, platelets 573, WBC 5.4, glucose 140, BUN 42, creatinine 1.61 otherwise BMP normal 04/2012:  Hemoglobin 9.9, MCV 80, otherwise CBC normal.    Creatinine 1.62, otherwise CMP normal.    Fasting lipid panel normal.    ASSESSMENT/PLAN:  Diabetes mellitus with renal complications.  Well controlled.  Anemia of chronic kidney disease.  Stable.   Dementia.  Moderately advanced.    Renovascular hypertension. Uncontrolled. Norvasc was started. Will review a log.  Hyperlipidemia.  Well controlled.    BPH.  Flomax was discontinued.    Insomnia.  Continue trazodone.    Vitamin B12 deficiency.  Continue supplementation.   CPT CODE: 82956

## 2012-10-17 ENCOUNTER — Non-Acute Institutional Stay (SKILLED_NURSING_FACILITY): Payer: Medicare Other | Admitting: Internal Medicine

## 2012-10-17 DIAGNOSIS — E1165 Type 2 diabetes mellitus with hyperglycemia: Secondary | ICD-10-CM

## 2012-10-17 DIAGNOSIS — S91309A Unspecified open wound, unspecified foot, initial encounter: Secondary | ICD-10-CM

## 2012-10-17 DIAGNOSIS — S91302A Unspecified open wound, left foot, initial encounter: Secondary | ICD-10-CM

## 2012-10-17 NOTE — Progress Notes (Signed)
Patient ID: JAIDYN KUHL, male   DOB: 1945-12-31, 67 y.o.   MRN: [id number] Facility; [facility] Chief complaint; wound on the plantar aspect of the left great toe History; Mr. Makarewicz is a type II diabetic. He has a history of wounds on his toes appear last issue I see was from earlier this year when he had an infection in the right great toe with Proteus. He is ambulatory and does not use diabetic footwear. I have looked through [facility] link don't see any arterial studies on this man. Currently he is denying pain in his remained systemically well.   Physical examination; Left foot; the area in question was on the plantar aspect of his left great toe. There was an odor here but no specific purulence. The area is not grossly infected. Using a scalpel and pickups I removed the necrotic eschar and skin to expose a reasonably healthy-looking wound bed. There is macerated skin around this. Circulation his peripheral pulses are difficult to feel the. 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 This has undergone debridement as noted. I will dress the area with silver alginate and Kerlix wrap the. He is a aggressive ambulator and is unlikely apparently to the degree to a non-weightbearing status of any form. I am going to x-ray the toe has apparently we have had issues with wound see her in the past. He will also need arterial Dopplers. This point there seems no particular reason for empiric antibiotics.

## 2012-10-23 ENCOUNTER — Non-Acute Institutional Stay (SKILLED_NURSING_FACILITY): Payer: Medicare Other | Admitting: Internal Medicine

## 2012-10-23 DIAGNOSIS — D631 Anemia in chronic kidney disease: Secondary | ICD-10-CM

## 2012-10-23 DIAGNOSIS — N189 Chronic kidney disease, unspecified: Secondary | ICD-10-CM

## 2012-10-31 ENCOUNTER — Non-Acute Institutional Stay (SKILLED_NURSING_FACILITY): Payer: Medicare Other | Admitting: Internal Medicine

## 2012-10-31 DIAGNOSIS — I150 Renovascular hypertension: Secondary | ICD-10-CM

## 2012-10-31 DIAGNOSIS — E1129 Type 2 diabetes mellitus with other diabetic kidney complication: Secondary | ICD-10-CM

## 2012-10-31 DIAGNOSIS — F0280 Dementia in other diseases classified elsewhere without behavioral disturbance: Secondary | ICD-10-CM

## 2012-10-31 DIAGNOSIS — D631 Anemia in chronic kidney disease: Secondary | ICD-10-CM

## 2012-11-01 ENCOUNTER — Other Ambulatory Visit (HOSPITAL_BASED_OUTPATIENT_CLINIC_OR_DEPARTMENT_OTHER): Payer: Self-pay | Admitting: Internal Medicine

## 2012-11-01 DIAGNOSIS — M869 Osteomyelitis, unspecified: Secondary | ICD-10-CM

## 2012-11-01 NOTE — Progress Notes (Signed)
Patient ID: [name], male   DOB: [birth year], 67 y.o.   MRN: [id number]        PROGRESS NOTE  DATE: 10/31/2012  FACILITY: Nursing Home Location: [facility]  LEVEL OF CARE: SNF (31)  Routine Visit  CHIEF COMPLAINT:  Manage anemia of chronic kidney disease, diabetes mellitus, and dementia.    HISTORY OF PRESENT ILLNESS:  REASSESSMENT OF ONGOING PROBLEM(S):  DEMENTIA: The dementia remains stable and continues to function adequately in the current living environment with supervision.  The patient has had little changes in behavior. No complications noted from the medications presently being used.  Patient is a poor historian.    ANEMIA: The anemia has been stable. The patient denies fatigue, melena or hematochezia. No complications from the medications currently being used.  The anemia is secondary to chronic kidney disease.  On 05/09/2012, hemoglobin was 9.9, MCV 80.  On 04/24/2012, hemoglobin was 9.8, in 7/14 hemoglobin 8.6, MCV 80.  In 10/14 Hb 9.6, mcv 80.  DM:pt's DM remains stable.  Pt denies polyuria, polydipsia, polyphagia, changes in vision or hypoglycemic episodes.  No complications noted from the medication presently being used.  Last hemoglobin A1c is:  04/2012:  Hemoglobin A1C 6.7, in 7/14 hemoglobin A1c 6.6, in 10/14 HbA1c 6.6.  PAST MEDICAL HISTORY : Reviewed.  No changes.  CURRENT MEDICATIONS: Reviewed per [facility]  REVIEW OF SYSTEMS:  Difficult to obtain due to dementia.    PHYSICAL EXAMINATION  VS:  T 98      P 68     RR 20    BP 150/70     POX %     WT (Lb) 219.9  GENERAL: no acute distress, normal body habitus EYES: nl sclerae, nl conjunctivae, no discharge NECK: supple, trachea midline, no neck masses, no thyroid tenderness, no thyromegaly LYMPHATICS: no cervical LAN, no supraclavicular LAN RESPIRATORY: breathing is even & unlabored, BS CTAB CARDIAC: RRR, no murmur,no extra heart sounds EDEMA/VARICOSITIES:  +2 bilateral lower extremity edema   ARTERIAL: pedal pulses nonpalpable  GI: abdomen soft, normal BS, no masses, no tenderness, no hepatomegaly, no splenomegaly PSYCHIATRIC: the patient is alert & oriented to person, affect & behavior appropriate  LABS/RADIOLOGY:  10/14 Hb 9.6, mcv 80 ow cbc nl, cr 1.51, alb 3.5 ow cmp nl, FLP nl  7/14 hemoglobin 8.6, MCV 80, platelets 573, WBC 5.4, glucose 140, BUN 42, creatinine 1.61 otherwise BMP normal 04/2012:  Hemoglobin 9.9, MCV 80, otherwise CBC normal.    Creatinine 1.62, otherwise CMP normal.    Fasting lipid panel normal.    ASSESSMENT/PLAN:  Diabetes mellitus with renal complications.  Well controlled.  Anemia of chronic kidney disease.  Hb improved.  Dementia.  Moderately advanced.    Renovascular hypertension. Uncontrolled. Increase norvasc to 10mg  qd.  Hyperlipidemia.  Well controlled.    BPH.  Flomax was discontinued.    Insomnia.  Continue trazodone.    Vitamin B12 deficiency.  Continue supplementation.   CPT CODE: 40981

## 2012-11-02 ENCOUNTER — Ambulatory Visit (HOSPITAL_COMMUNITY)
Admission: RE | Admit: 2012-11-02 | Discharge: 2012-11-02 | Disposition: A | Discharge status: Home / Self Care | Payer: Medicare Other | Source: Ambulatory Visit | Attending: Internal Medicine | Admitting: Internal Medicine

## 2012-11-02 DIAGNOSIS — L02619 Cutaneous abscess of unspecified foot: Secondary | ICD-10-CM | POA: Insufficient documentation

## 2012-11-02 DIAGNOSIS — M869 Osteomyelitis, unspecified: Secondary | ICD-10-CM

## 2012-11-02 DIAGNOSIS — L03039 Cellulitis of unspecified toe: Secondary | ICD-10-CM | POA: Insufficient documentation

## 2012-11-02 DIAGNOSIS — L97509 Non-pressure chronic ulcer of other part of unspecified foot with unspecified severity: Secondary | ICD-10-CM | POA: Insufficient documentation

## 2012-11-02 DIAGNOSIS — E119 Type 2 diabetes mellitus without complications: Secondary | ICD-10-CM | POA: Insufficient documentation

## 2012-11-03 ENCOUNTER — Non-Acute Institutional Stay (SKILLED_NURSING_FACILITY): Payer: Medicare Other | Admitting: Internal Medicine

## 2012-11-03 DIAGNOSIS — S91309A Unspecified open wound, unspecified foot, initial encounter: Secondary | ICD-10-CM

## 2012-11-03 DIAGNOSIS — M869 Osteomyelitis, unspecified: Secondary | ICD-10-CM

## 2012-11-03 DIAGNOSIS — M9080 Osteopathy in diseases classified elsewhere, unspecified site: Secondary | ICD-10-CM

## 2012-11-03 DIAGNOSIS — S91302A Unspecified open wound, left foot, initial encounter: Secondary | ICD-10-CM

## 2012-11-03 DIAGNOSIS — E1169 Type 2 diabetes mellitus with other specified complication: Secondary | ICD-10-CM

## 2012-11-03 NOTE — Progress Notes (Signed)
Patient ID: [name], male   DOB: 1945/07/31, 67 y.o.   MRN: [id number] Facility; [facility] Chief complaint; followup wound of the Left Great toe. History; this is a patient I have been following for the last few weeks with a wound on the plantar aspect of his left great toe. 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 He also has some degree of microvascular disease with an ABI on the left of 0.83 I saw this man 3 days ago ordered an MRI of the foot today which shows extensive areas of osteomyelitis and cellulitis. I had started him empirically on Levaquin and clindamycin. Situation is complicated by the fact that he wanders status secondary to dementia. Offloading this is really a challenge a dark or front foot offloading boot would probably throw him off balance and resultant falls. At one point he was wheelchair after returning in from the hospital in the summer although nobody is certain that he would agree to this currently. Per the wound care team this is probably the third go-round of the wound on his toe. Indeed when he was in the hospital in June Men and an ulcer on his toe with cellulitis. This was treated with clindamycin.  contrast was administered.   COMPARISON:  None.   FINDINGS: There is abnormal signal in the proximal and distal phalanges of the great toe compatible with osteomyelitis. Irregularity of the interphalangeal joint may reflect septic arthritis although there is only a small amount of fluid in the joint. This may be draining to the ulceration along the medial portion of the great toe.   Extensive edema in the soft tissues of the great toe, with a moderate amount of edema tracking in the dorsum of the foot and in the remaining toes.   Distal metatarsals intact. First digit sesamoids intact. No additional regions of osteomyelitis in the toes.   IMPRESSION: 1. Osteomyelitis of the proximal and distal phalanges of the great toe, with  extensive surrounding cellulitis, ulceration of the great toe, and potential cellulitis tracking in the remaining toes and along the dorsum of the foot. 2. Cannot exclude septic 1st digit interphalangeal joint. However, there is only a small amount of fluid in the joint, and this may be draining to the ulceration site.     Review of system: This is really not possible however he does not appear to be in obvious pain, afebrile  On examination; Gen. patient is not systemically unwell. Extremities significant venous stasis bilaterally. On the plantar aspect of his left great toe is a significant wound running from his PIP joint to the base of his MTP joint the per is actually looks better than when I divided this 2 days ago. We have been applying silver alginate here. 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 There is coexistent cellulitis. I have him on clindamycin and Levaquin. A culture of this area that I did 3 days ago is still not available. Her now I am going to continue his oral antibiotics pending the culture result 2. After that I may have the ability to do targeted IV antibiotic therapy. A larger issue here is how to offload this area which seems fraught with a lot of difficult decisions so. He wanders incessantly, I don't believe this wound will heal with this degree of direct pressure and trauma. Finally there would  have to be a thought about proceeding with an amputation either a ray amputation on the first ray or perhaps at a more aggressive amputation. I am going to discuss the situation with his sister who is his power of attorney. Unfortunately I don't see a good option here.

## 2012-11-16 NOTE — Progress Notes (Signed)
Patient ID: [name], male   DOB: May 24, 1945, 67 y.o.   MRN: [id number]        PROGRESS NOTE  DATE: 10/23/2012  FACILITY:  [facility]  LEVEL OF CARE: SNF (31)  Acute Visit  CHIEF COMPLAINT:  Manage anemia of chronic kidney disease and chronic kidney disease.    HISTORY OF PRESENT ILLNESS: I was requested by the staff to assess the patient regarding above problem(s):  ANEMIA: The anemia has been stable. The patient denies fatigue, melena or hematochezia. No complications from the medications currently being used.  On 10/18/2012:  Hemoglobin 9.6.  In 07/2012:  Hemoglobin 8.6.    CHRONIC KIDNEY DISEASE: The patient's chronic kidney disease remains stable.  Patient denies increasing lower extremity swelling or confusion. Last BUN and creatinine are:   On 10/18/2012:  BUN 26, creatinine 1.51.  In 07/2012:  BUN 42, creatinine 1.61.    PAST MEDICAL HISTORY : Reviewed.  No changes.  CURRENT MEDICATIONS: Reviewed per [facility]  REVIEW OF SYSTEMS:  Difficult to obtain.  Patient is a poor historian.    PHYSICAL EXAMINATION  GENERAL: no acute distress, moderately obese body habitus NECK: supple, trachea midline, no neck masses, no thyroid tenderness, no thyromegaly RESPIRATORY: breathing is even & unlabored, BS CTAB CARDIAC: RRR, no murmur,no extra heart sounds   EDEMA/VARICOSITIES:  +2 bilateral lower extremity edema  ARTERIAL:  pedal pulses nonpalpable   GI: abdomen soft, normal BS, no masses, no tenderness, no hepatomegaly, no splenomegaly, patient has a colostomy bag and a PEG tube   PSYCHIATRIC: the patient is alert & oriented to person, affect & behavior appropriate  ASSESSMENT/PLAN:  Anemia of chronic kidney disease.  Hemoglobin improved.     Chronic kidney disease.  Renal functions improved.    CPT CODE: 40981

## 2012-11-21 ENCOUNTER — Non-Acute Institutional Stay (SKILLED_NURSING_FACILITY): Payer: Medicare Other | Admitting: Internal Medicine

## 2012-11-21 DIAGNOSIS — D631 Anemia in chronic kidney disease: Secondary | ICD-10-CM

## 2012-11-21 DIAGNOSIS — I150 Renovascular hypertension: Secondary | ICD-10-CM

## 2012-11-21 DIAGNOSIS — E1129 Type 2 diabetes mellitus with other diabetic kidney complication: Secondary | ICD-10-CM

## 2012-11-21 DIAGNOSIS — F0280 Dementia in other diseases classified elsewhere without behavioral disturbance: Secondary | ICD-10-CM

## 2012-11-21 NOTE — Progress Notes (Signed)
Patient ID: [name], male   DOB: Mar 31, 1945, 66 y.o.   MRN: [id number]        PROGRESS NOTE  DATE: 11/21/2012  FACILITY: Nursing Home Location: [facility]  LEVEL OF CARE: SNF (31)  Routine Visit  CHIEF COMPLAINT:  Manage anemia of chronic kidney disease, diabetes mellitus, and dementia.    HISTORY OF PRESENT ILLNESS:  REASSESSMENT OF ONGOING PROBLEM(S):  DEMENTIA: The dementia remains stable and continues to function adequately in the current living environment with supervision.  The patient has had little changes in behavior. No complications noted from the medications presently being used.  Patient is a poor historian.    ANEMIA: The anemia has been stable. The patient denies fatigue, melena or hematochezia. No complications from the medications currently being used.  The anemia is secondary to chronic kidney disease.  On 05/09/2012, hemoglobin was 9.9, MCV 80.  On 04/24/2012, hemoglobin was 9.8, in 7/14 hemoglobin 8.6, MCV 80.  In 10/14 Hb 9.6, mcv 80.  DM:pt's DM remains stable.  Pt denies polyuria, polydipsia, polyphagia, changes in vision or hypoglycemic episodes.  No complications noted from the medication presently being used.  Last hemoglobin A1c is:  04/2012:  Hemoglobin A1C 6.7, in 7/14 hemoglobin A1c 6.6, in 10/14 HbA1c 6.6.  PAST MEDICAL HISTORY : Reviewed.  No changes.  CURRENT MEDICATIONS: Reviewed per [facility]  REVIEW OF SYSTEMS:  Difficult to obtain due to dementia.    PHYSICAL EXAMINATION  VS:  T 97.5      P 65     RR 18    BP 127/72     POX %     WT (Lb) 225  GENERAL: no acute distress, normal body habitus NECK: supple, trachea midline, no neck masses, no thyroid tenderness, no thyromegaly RESPIRATORY: breathing is even & unlabored, BS CTAB CARDIAC: RRR, no murmur,no extra heart sounds EDEMA/VARICOSITIES:  +2 bilateral lower extremity edema  ARTERIAL: pedal pulses nonpalpable  GI: abdomen soft, normal BS, no masses, no tenderness, no  hepatomegaly, no splenomegaly PSYCHIATRIC: the patient is alert & oriented to person, affect & behavior appropriate  LABS/RADIOLOGY:  10/14 Hb 9.6, mcv 80 ow cbc nl, cr 1.51, alb 3.5 ow cmp nl, FLP nl  7/14 hemoglobin 8.6, MCV 80, platelets 573, WBC 5.4, glucose 140, BUN 42, creatinine 1.61 otherwise BMP normal 04/2012:  Hemoglobin 9.9, MCV 80, otherwise CBC normal.    Creatinine 1.62, otherwise CMP normal.    Fasting lipid panel normal.    ASSESSMENT/PLAN:  Diabetes mellitus with renal complications.  Well controlled.  Anemia of chronic kidney disease.  Hb improved.  Dementia.  Moderately advanced.    Renovascular hypertension. Blood pressure improved  Hyperlipidemia.  Well controlled.     Insomnia.  Continue trazodone.    Vitamin B12 deficiency.  Continue supplementation.   CPT CODE: 82956

## 2012-12-06 ENCOUNTER — Non-Acute Institutional Stay (SKILLED_NURSING_FACILITY): Payer: Medicare Other | Admitting: Internal Medicine

## 2012-12-06 DIAGNOSIS — M869 Osteomyelitis, unspecified: Secondary | ICD-10-CM

## 2012-12-06 DIAGNOSIS — M9080 Osteopathy in diseases classified elsewhere, unspecified site: Secondary | ICD-10-CM

## 2012-12-06 DIAGNOSIS — E1169 Type 2 diabetes mellitus with other specified complication: Secondary | ICD-10-CM

## 2012-12-06 NOTE — Progress Notes (Signed)
Patient ID: [name], male   DOB: [birth year], 67 y.o.   MRN: [id number] Facility; [facility] Chief complaint; followup wound of the Left Great toe. History; this is a patient I have been following for 6-7 weeks with a wound on the plantar aspect of his left great toe. 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 He also has some degree of microvascular disease with an ABI on the left of 0.83 I saw this man 4 weeks ago ordered an MRI of the foot today which shows extensive areas of osteomyelitis and cellulitis. I had started him empirically on Levaquin and clindamycin. Situation is complicated by the fact that he wanders status secondary to dementia. Offloading this is really a challenge a dark or front foot offloading boot would probably throw him off balance and resultant falls. At one point he was wheelchair after returning in from the hospital in the summer although nobody is certain that he would agree to this currently. Per the wound care team this is probably the third go-round of the wound on his toe. Indeed when he was in the hospital in June he and an ulcer on his toe with cellulitis. This was treated with clindamycin.  We have been using silver alginate on this wound. Nevertheless there is no improvement in the wound and I have been asked to review this. Once again it was hard to even get him into bed to examine him for his want to get up and start walking   COMPARISON:  None.   FINDINGS: There is abnormal signal in the proximal and distal phalanges of the great toe compatible with osteomyelitis. Irregularity of the interphalangeal joint may reflect septic arthritis although there is only a small amount of fluid in the joint. This may be draining to the ulceration along the medial portion of the great toe.   Extensive edema in the soft tissues of the great toe, with a moderate amount of edema tracking in the dorsum of the foot and in the remaining toes.    Distal metatarsals intact. First digit sesamoids intact. No additional regions of osteomyelitis in the toes.   IMPRESSION: 1. Osteomyelitis of the proximal and distal phalanges of the great toe, with extensive surrounding cellulitis, ulceration of the great toe, and potential cellulitis tracking in the remaining toes and along the dorsum of the foot. 2. Cannot exclude septic 1st digit interphalangeal joint. However, there is only a small amount of fluid in the joint, and this may be draining to the ulceration site.     Review of system: This is really not possible however he does not appear to be in obvious pain, afebrile  On examination; Gen. patient is not systemically unwell. Extremities significant venous stasis bilaterally the areas on the lateral aspect of his left great toe. Skin on it says toe itself is complicated by a cobblestone-type appear of the skin probably secondary to chronic venous insufficiency. The wound itself has relatively clean-looking base however there is depth of the wound. 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 There has not made any progress. He has been on Levaquin and clindamycin for a month now. I don't believe cultures were never positive of this area. There itself does not look ominous although it is certainly not in a healing state. The area of overhang here is going to need to be debrided  in order to have any hope of healing this however underneath this area is palpable bone. One would have to wonder whether this is even a viable situation and whether he is just needs an amputation the patient incessantly wanders which complicates the issue even further. At this point I don't think the Levaquin and vancomycin are doing much good and I am going to discontinue the use. The debridement itself is likely to be complicated. He is also not an  easy man to get to a Conservation officer, nature due to the dementia and wandering. Will probably make it an attempt at debridement this in the facility. At that point changing him to IV antibiotics might be a reasonable addition.

## 2012-12-12 ENCOUNTER — Non-Acute Institutional Stay (SKILLED_NURSING_FACILITY): Payer: Medicare Other | Admitting: Internal Medicine

## 2012-12-12 DIAGNOSIS — N189 Chronic kidney disease, unspecified: Secondary | ICD-10-CM

## 2012-12-12 DIAGNOSIS — D631 Anemia in chronic kidney disease: Secondary | ICD-10-CM

## 2012-12-22 ENCOUNTER — Non-Acute Institutional Stay (SKILLED_NURSING_FACILITY): Payer: Medicare Other | Admitting: Internal Medicine

## 2012-12-22 DIAGNOSIS — M869 Osteomyelitis, unspecified: Secondary | ICD-10-CM

## 2012-12-22 DIAGNOSIS — M9080 Osteopathy in diseases classified elsewhere, unspecified site: Secondary | ICD-10-CM

## 2012-12-22 DIAGNOSIS — E1169 Type 2 diabetes mellitus with other specified complication: Secondary | ICD-10-CM

## 2012-12-26 ENCOUNTER — Non-Acute Institutional Stay (SKILLED_NURSING_FACILITY): Payer: Medicare Other | Admitting: Internal Medicine

## 2012-12-26 ENCOUNTER — Encounter: Payer: Self-pay | Admitting: Internal Medicine

## 2012-12-26 DIAGNOSIS — F0280 Dementia in other diseases classified elsewhere without behavioral disturbance: Secondary | ICD-10-CM

## 2012-12-26 DIAGNOSIS — E1129 Type 2 diabetes mellitus with other diabetic kidney complication: Secondary | ICD-10-CM

## 2012-12-26 DIAGNOSIS — D631 Anemia in chronic kidney disease: Secondary | ICD-10-CM

## 2012-12-26 DIAGNOSIS — I150 Renovascular hypertension: Secondary | ICD-10-CM

## 2012-12-26 NOTE — Progress Notes (Signed)
Patient ID: [name], male   DOB: 1945-04-06, 67 y.o.   MRN: [id number]        PROGRESS NOTE  DATE: 12/26/2012  FACILITY: Nursing Home Location: [facility]  LEVEL OF CARE: SNF (31)  Routine Visit  CHIEF COMPLAINT:  Manage anemia of chronic kidney disease, diabetes mellitus, and dementia.    HISTORY OF PRESENT ILLNESS:  REASSESSMENT OF ONGOING PROBLEM(S):  DEMENTIA: The dementia remains stable and continues to function adequately in the current living environment with supervision.  The patient has had little changes in behavior. No complications noted from the medications presently being used.  Patient is a poor historian.    ANEMIA: The anemia has been stable. The patient denies fatigue, melena or hematochezia. No complications from the medications currently being used.  The anemia is secondary to chronic kidney disease.  On 05/09/2012, hemoglobin was 9.9, MCV 80.  On 04/24/2012, hemoglobin was 9.8, in 7/14 hemoglobin 8.6, MCV 80.  In 10/14 Hb 9.6, mcv 80, 12-14 hemoglobin 12.2, MCV 79  DM:pt's DM remains stable.  Pt denies polyuria, polydipsia, polyphagia, changes in vision or hypoglycemic episodes.  No complications noted from the medication presently being used.  Last hemoglobin A1c is:  04/2012:  Hemoglobin A1C 6.7, in 7/14 hemoglobin A1c 6.6, in 10/14 HbA1c 6.6.  PAST MEDICAL HISTORY : Reviewed.  No changes.  CURRENT MEDICATIONS: Reviewed per [facility]  REVIEW OF SYSTEMS:  Difficult to obtain due to dementia.    PHYSICAL EXAMINATION  VS:  T 98      P 76     RR 18    BP 132/60     POX %     WT (Lb) 230  GENERAL: no acute distress, normal body habitus NECK: supple, trachea midline, no neck masses, no thyroid tenderness, no thyromegaly RESPIRATORY: breathing is even & unlabored, BS CTAB CARDIAC: RRR, no murmur,no extra heart sounds EDEMA/VARICOSITIES:  +2 bilateral lower extremity edema  ARTERIAL: pedal pulses nonpalpable  GI: abdomen soft, normal BS, no  masses, no tenderness, no hepatomegaly, no splenomegaly PSYCHIATRIC: the patient is alert & oriented to person, affect & behavior appropriate  LABS/RADIOLOGY: 12-14 hemoglobin 12.2, MCV 79 otherwise CBC normal, creatinine 1.48 otherwise BMP normal  10/14 Hb 9.6, mcv 80 ow cbc nl, cr 1.51, alb 3.5 ow cmp nl, FLP nl  7/14 hemoglobin 8.6, MCV 80, platelets 573, WBC 5.4, glucose 140, BUN 42, creatinine 1.61 otherwise BMP normal 04/2012:  Hemoglobin 9.9, MCV 80, otherwise CBC normal.    Creatinine 1.62, otherwise CMP normal.    Fasting lipid panel normal.    ASSESSMENT/PLAN:  Diabetes mellitus with renal complications.  Well controlled.  Anemia of chronic kidney disease.  Hb improved.  Dementia.  Moderately advanced.    Renovascular hypertension. Blood pressure improved  Hyperlipidemia.  Well controlled.     Insomnia.  Continue trazodone.    Vitamin B12 deficiency.  Continue supplementation.   CPT CODE: 82956

## 2012-12-29 NOTE — Progress Notes (Deleted)
Subjective:      Patient ID: [name], male   DOB: Mar 12, 1945, 67 y.o.   MRN: [id number]  HPI   Review of Systems     Objective:   Physical Exam     Assessment:     ***    Plan:     ***

## 2013-01-01 NOTE — Progress Notes (Signed)
Patient ID: DERICK SEMINARA, male   DOB: 1945-10-03, 67 y.o.   MRN: [id number]           PROGRESS NOTE  DATE:  12/22/2012    FACILITY: [facility]    LEVEL OF CARE:   SNF   Acute Visit   CHIEF COMPLAINT:  Review of wound on the left great toe.    HISTORY OF PRESENT ILLNESS:  I saw Mr. Schnitzler for this wound several weeks ago.  It is an area on the plantar aspect of his left great toe.  We have previously done vascular studies that show some degree of macrovascular disease with an ABI on the left of 0.83.  An MRI of the foot done roughly six weeks ago now showed extensive areas of osteomyelitis and cellulitis.  He had an empiric course of Levaquin and clindamycin.  However, we never did culture an organism.  The whole area is complicated by thickened, cobblestoned skin, probably from many years of chronic venous insufficiency.      PHYSICAL EXAMINATION:   CIRCULATION:  EDEMA/VARICOSITIES:  A patient with significant venous stasis bilaterally.   SKIN:  INSPECTION:  The skin on the left great toe itself is a cobblestoned, thickened type, chronic venous insufficiency damaged skin.  The wound has, fortunately, closed over in some respect.  There is now no open bone.  There is atrophy looking tissue at the base of this.   The overhang seems to have resolved.    ASSESSMENT/PLAN:  Diabetic osteomyelitis of the left great toe.  This has made considerable progress.  We are using silver alginate, changed every two days.  I had thought that an extensive debridement would need to be done on this.  However, given the improvement of this today, I really do not think this is going to be necessary.  Further, I was considering an issue of an advanced treatment option for which I have some donated supplies and/or another course of intravenous antibiotics.  However, given the improvement in this today, I am going to hold off on this.    CPT CODE: 66440

## 2013-01-23 ENCOUNTER — Non-Acute Institutional Stay (SKILLED_NURSING_FACILITY): Payer: Medicare Other | Admitting: Internal Medicine

## 2013-01-23 DIAGNOSIS — N039 Chronic nephritic syndrome with unspecified morphologic changes: Secondary | ICD-10-CM

## 2013-01-23 DIAGNOSIS — G309 Alzheimer's disease, unspecified: Secondary | ICD-10-CM

## 2013-01-23 DIAGNOSIS — F0280 Dementia in other diseases classified elsewhere without behavioral disturbance: Secondary | ICD-10-CM

## 2013-01-23 DIAGNOSIS — D631 Anemia in chronic kidney disease: Secondary | ICD-10-CM

## 2013-01-23 DIAGNOSIS — E1129 Type 2 diabetes mellitus with other diabetic kidney complication: Secondary | ICD-10-CM

## 2013-01-23 DIAGNOSIS — I150 Renovascular hypertension: Secondary | ICD-10-CM

## 2013-01-23 NOTE — Progress Notes (Signed)
Patient ID: [name], male   DOB: [birth year], 68 y.o.   MRN: [id number]         PROGRESS NOTE  DATE: 01/23/2013  FACILITY: Nursing Home Location: [facility]  LEVEL OF CARE: SNF (31)  Routine Visit  CHIEF COMPLAINT:  Manage anemia of chronic kidney disease, diabetes mellitus, and dementia.    HISTORY OF PRESENT ILLNESS:  REASSESSMENT OF ONGOING PROBLEM(S):  DEMENTIA: The dementia remains stable and continues to function adequately in the current living environment with supervision.  The patient has had little changes in behavior. No complications noted from the medications presently being used.  Patient is a poor historian.    ANEMIA: The anemia has been stable. The staff deny fatigue, melena or hematochezia. No complications from the medications currently being used.  The anemia is secondary to chronic kidney disease.  On 05/09/2012, hemoglobin was 9.9, MCV 80.  On 04/24/2012, hemoglobin was 9.8, in 7/14 hemoglobin 8.6, MCV 80.  In 10/14 Hb 9.6, mcv 80, 12-14 hemoglobin 11.2, MCV 79  DM:pt's DM remains stable.  Staff deny polyuria, polydipsia, polyphagia, changes in vision or hypoglycemic episodes.  No complications noted from the medication presently being used.  Last hemoglobin A1c is:  04/2012:  Hemoglobin A1C 6.7, in 7/14 hemoglobin A1c 6.6, in 10/14 HbA1c 6.6.  PAST MEDICAL HISTORY : Reviewed.  No changes.  CURRENT MEDICATIONS: Reviewed per [facility]  REVIEW OF SYSTEMS:  Difficult to obtain due to dementia.    PHYSICAL EXAMINATION  VS:  T 98      P 72     RR 22    BP 148/56    POX %     WT (Lb) 228  GENERAL: no acute distress, normal body habitus NECK: supple, trachea midline, no neck masses, no thyroid tenderness, no thyromegaly RESPIRATORY: breathing is even & unlabored, BS CTAB CARDIAC: RRR, no murmur,no extra heart sounds EDEMA/VARICOSITIES:  +2 bilateral lower extremity edema  ARTERIAL: pedal pulses nonpalpable  GI: abdomen soft, normal BS, no  masses, no tenderness, no hepatomegaly, no splenomegaly PSYCHIATRIC: the patient is alert & oriented to person, affect & behavior appropriate  LABS/RADIOLOGY: 12-14 hemoglobin 11.2, MCV 79 otherwise CBC normal, creatinine 1.48 otherwise BMP normal  10/14 Hb 9.6, mcv 80 ow cbc nl, cr 1.51, alb 3.5 ow cmp nl, FLP nl  7/14 hemoglobin 8.6, MCV 80, platelets 573, WBC 5.4, glucose 140, BUN 42, creatinine 1.61 otherwise BMP normal 04/2012:  Hemoglobin 9.9, MCV 80, otherwise CBC normal.    Creatinine 1.62, otherwise CMP normal.    Fasting lipid panel normal.    ASSESSMENT/PLAN:  Diabetes mellitus with renal complications.  Well controlled.  Anemia of chronic kidney disease.  Hb improved.  Dementia.  Moderately advanced.    Renovascular hypertension. Blood pressure borderline  Hyperlipidemia.  Well controlled.     Insomnia.  Continue trazodone.    Vitamin B12 deficiency.  Continue supplementation.   CPT CODE: 09983

## 2013-01-31 ENCOUNTER — Encounter: Payer: Self-pay | Admitting: Internal Medicine

## 2013-01-31 NOTE — Progress Notes (Signed)
Patient ID: [name], male   DOB: 12-15-1945, 68 y.o.   MRN: [id number]          PROGRESS NOTE  DATE: 12/12/2012    FACILITY:  [facility]  LEVEL OF CARE: SNF (31)  Acute Visit  CHIEF COMPLAINT:  Manage anemia of chronic kidney disease and chronic kidney disease.    HISTORY OF PRESENT ILLNESS: I was requested by the staff to assess the patient regarding above problem(s):  ANEMIA: The anemia has been stable. The staff denies fatigue, melena or hematochezia. No complications from the medications currently being used.   Patient is a poor historian.   On 12/11/2012:  Hemoglobin 11.2, MCV 89.  In 10/2012:  Hemoglobin 9.6, MCV 80.    CHRONIC KIDNEY DISEASE: The patient's chronic kidney disease remains stable.  Staff denies increasing lower extremity swelling or confusion. Last BUN and creatinine are:   On 12/11/2012:  BUN 21, creatinine 1.48.  In 10/2012:  Creatinine 1.51.     PAST MEDICAL HISTORY : Reviewed.  No changes.  CURRENT MEDICATIONS: Reviewed per [facility]  REVIEW OF SYSTEMS:  Unobtainable due to patient being a poor historian.    PHYSICAL EXAMINATION  GENERAL: no acute distress, moderately obese body habitus EYES: conjunctivae normal, sclerae normal, normal eye lids NECK: supple, trachea midline, no neck masses, no thyroid tenderness, no thyromegaly LYMPHATICS: no LAN in the neck, no supraclavicular LAN RESPIRATORY: breathing is even & unlabored, BS CTAB CARDIAC: RRR, no murmur,no extra heart sounds EDEMA/VARICOSITIES:  +2 bilateral lower extremity edema  ARTERIAL:  pedal pulses nonpalpable   GI: abdomen soft, normal BS, no masses, no tenderness, no hepatomegaly, no splenomegaly PSYCHIATRIC: the patient is alert & oriented to person, affect & behavior appropriate  ASSESSMENT/PLAN:  Anemia of chronic kidney disease.  Hemoglobin improved.   Continue iron.    Chronic kidney disease.  Renal functions improved.    THN Metrics:   Aspirin 81 mg.  BP:   127/72.  Nonsmoker.    CPT CODE: 44010

## 2013-06-06 ENCOUNTER — Non-Acute Institutional Stay (SKILLED_NURSING_FACILITY): Payer: Medicare Other | Admitting: Internal Medicine

## 2013-06-06 DIAGNOSIS — F0280 Dementia in other diseases classified elsewhere without behavioral disturbance: Secondary | ICD-10-CM

## 2013-06-06 DIAGNOSIS — G309 Alzheimer's disease, unspecified: Secondary | ICD-10-CM

## 2013-06-06 DIAGNOSIS — E1129 Type 2 diabetes mellitus with other diabetic kidney complication: Secondary | ICD-10-CM

## 2013-06-06 DIAGNOSIS — N039 Chronic nephritic syndrome with unspecified morphologic changes: Secondary | ICD-10-CM

## 2013-06-06 DIAGNOSIS — D631 Anemia in chronic kidney disease: Secondary | ICD-10-CM

## 2013-06-06 DIAGNOSIS — I150 Renovascular hypertension: Secondary | ICD-10-CM

## 2013-06-07 NOTE — Progress Notes (Signed)
Patient ID: [name], male   DOB: [birth year], 68 y.o.   MRN: [id number]         PROGRESS NOTE  DATE: 06/06/2013  FACILITY: Nursing Home Location: [facility]  LEVEL OF CARE: SNF (31)  Routine Visit  CHIEF COMPLAINT:  Manage anemia of chronic kidney disease, diabetes mellitus, and dementia.    HISTORY OF PRESENT ILLNESS:  REASSESSMENT OF ONGOING PROBLEM(S):  DEMENTIA: The dementia remains stable and continues to function adequately in the current living environment with supervision.  The patient has had little changes in behavior. No complications noted from the medications presently being used.  Patient is a poor historian.    ANEMIA: The anemia has been stable. The staff deny fatigue, melena or hematochezia. No complications from the medications currently being used.  The anemia is secondary to chronic kidney disease.  On 05/09/2012, hemoglobin was 9.9, MCV 80.  On 04/24/2012, hemoglobin was 9.8, in 7/14 hemoglobin 8.6, MCV 80.  In 10/14 Hb 9.6, mcv 80, 12-14 hemoglobin 11.2, MCV 79, in 4-15 hemoglobin 10.8, MCV 79  DM:pt's DM remains stable.  Staff deny polyuria, polydipsia, polyphagia, changes in vision or hypoglycemic episodes.  No complications noted from the medication presently being used.  Last hemoglobin A1c is:  04/2012:  Hemoglobin A1C 6.7, in 7/14 hemoglobin A1c 6.6, in 10/14 HbA1c 6.6, in 4-15 hemoglobin A1c 6.4.  PAST MEDICAL HISTORY : Reviewed.  No changes.  CURRENT MEDICATIONS: Reviewed per [facility]  REVIEW OF SYSTEMS:  Difficult to obtain due to dementia.    PHYSICAL EXAMINATION  VS: See vital sign section  GENERAL: no acute distress, normal body habitus EYES: Normal sclerae, conjunctivae, no discharge NECK: supple, trachea midline, no neck masses, no thyroid tenderness, no thyromegaly LYMPHATICS: No cervical lymphadenopathy, no supraclavicular lymphadenopathy RESPIRATORY: breathing is even & unlabored, BS CTAB CARDIAC: RRR, no murmur,no extra  heart sounds EDEMA/VARICOSITIES:  +2 bilateral lower extremity edema  ARTERIAL: pedal pulses nonpalpable  GI: abdomen soft, normal BS, no masses, no tenderness, no hepatomegaly, no splenomegaly, has colostomy PSYCHIATRIC: the patient is alert & oriented to person, affect & behavior appropriate  LABS/RADIOLOGY: 4-15 hemoglobin 10.8, MCV 79 otherwise CBC normal, glucose 108, creatinine 1.48 otherwise CMP normal, triglycerides 158 otherwise fasting lipid panel normal 12-14 hemoglobin 11.2, MCV 79 otherwise CBC normal, creatinine 1.48 otherwise BMP normal  10/14 Hb 9.6, mcv 80 ow cbc nl, cr 1.51, alb 3.5 ow cmp nl, FLP nl  7/14 hemoglobin 8.6, MCV 80, platelets 573, WBC 5.4, glucose 140, BUN 42, creatinine 1.61 otherwise BMP normal 04/2012:  Hemoglobin 9.9, MCV 80, otherwise CBC normal.    Creatinine 1.62, otherwise CMP normal.    Fasting lipid panel normal.    ASSESSMENT/PLAN:  Diabetes mellitus with renal complications.  Well controlled.  Anemia of chronic kidney disease.  Hb improved.  Dementia.  Moderately advanced.    Renovascular hypertension. Blood pressure borderline  Hyperlipidemia.  Well controlled.     Insomnia.  Continue trazodone.    Vitamin B12 deficiency.  Continue supplementation.   CPT CODE: 76283  Gayani Y. Durwin Reges, [place] [phone]

## 2013-06-25 ENCOUNTER — Non-Acute Institutional Stay (SKILLED_NURSING_FACILITY): Payer: Medicare Other | Admitting: Internal Medicine

## 2013-06-25 DIAGNOSIS — I509 Heart failure, unspecified: Secondary | ICD-10-CM

## 2013-06-25 DIAGNOSIS — E876 Hypokalemia: Secondary | ICD-10-CM

## 2013-06-27 ENCOUNTER — Non-Acute Institutional Stay (SKILLED_NURSING_FACILITY): Payer: Medicare Other | Admitting: Internal Medicine

## 2013-06-27 DIAGNOSIS — F0280 Dementia in other diseases classified elsewhere without behavioral disturbance: Secondary | ICD-10-CM

## 2013-06-27 DIAGNOSIS — G309 Alzheimer's disease, unspecified: Secondary | ICD-10-CM

## 2013-06-27 DIAGNOSIS — D631 Anemia in chronic kidney disease: Secondary | ICD-10-CM

## 2013-06-27 DIAGNOSIS — E1129 Type 2 diabetes mellitus with other diabetic kidney complication: Secondary | ICD-10-CM

## 2013-06-27 DIAGNOSIS — I150 Renovascular hypertension: Secondary | ICD-10-CM

## 2013-06-27 DIAGNOSIS — N039 Chronic nephritic syndrome with unspecified morphologic changes: Secondary | ICD-10-CM

## 2013-06-28 DIAGNOSIS — E876 Hypokalemia: Secondary | ICD-10-CM | POA: Insufficient documentation

## 2013-06-28 DIAGNOSIS — I509 Heart failure, unspecified: Secondary | ICD-10-CM | POA: Insufficient documentation

## 2013-06-28 NOTE — Progress Notes (Signed)
Patient ID: [name], male   DOB: Dec 10, 1945, 68 y.o.   MRN: [id number]         PROGRESS NOTE  DATE: 06/27/2013  FACILITY: Nursing Home Location: [facility]  LEVEL OF CARE: SNF (31)  Routine Visit  CHIEF COMPLAINT:  Manage anemia of chronic kidney disease, diabetes mellitus, and dementia.    HISTORY OF PRESENT ILLNESS:  REASSESSMENT OF ONGOING PROBLEM(S):  DEMENTIA: The dementia remains stable and continues to function adequately in the current living environment with supervision.  The patient has had little changes in behavior. No complications noted from the medications presently being used.  Patient is a poor historian.    ANEMIA: The anemia has been stable. The staff deny fatigue, melena or hematochezia. No complications from the medications currently being used.  The anemia is secondary to chronic kidney disease.  On 05/09/2012, hemoglobin was 9.9, MCV 80.  On 04/24/2012, hemoglobin was 9.8, in 7/14 hemoglobin 8.6, MCV 80.  In 10/14 Hb 9.6, mcv 80, 12-14 hemoglobin 11.2, MCV 79, in 4-15 hemoglobin 10.8, MCV 79  DM:pt's DM remains stable.  Staff deny polyuria, polydipsia, polyphagia, changes in vision or hypoglycemic episodes.  No complications noted from the medication presently being used.  Last hemoglobin A1c is:  04/2012:  Hemoglobin A1C 6.7, in 7/14 hemoglobin A1c 6.6, in 10/14 HbA1c 6.6, in 4-15 hemoglobin A1c 6.4.  PAST MEDICAL HISTORY : Reviewed.  No changes.  CURRENT MEDICATIONS: Reviewed per [facility]  REVIEW OF SYSTEMS:  Difficult to obtain due to dementia.    PHYSICAL EXAMINATION  VS: See vital sign section  GENERAL: no acute distress, normal body habitus NECK: supple, trachea midline, no neck masses, no thyroid tenderness, no thyromegaly RESPIRATORY: breathing is even & unlabored, BS CTAB CARDIAC: RRR, no murmur,no extra heart sounds EDEMA/VARICOSITIES:  +2 bilateral lower extremity edema  ARTERIAL: pedal pulses nonpalpable  GI: abdomen soft,  normal BS, no masses, no tenderness, no hepatomegaly, no splenomegaly, has colostomy PSYCHIATRIC: the patient is alert & oriented to person, affect & behavior appropriate  LABS/RADIOLOGY: 4-15 hemoglobin 10.8, MCV 79 otherwise CBC normal, glucose 108, creatinine 1.48 otherwise CMP normal, triglycerides 158 otherwise fasting lipid panel normal 12-14 hemoglobin 11.2, MCV 79 otherwise CBC normal, creatinine 1.48 otherwise BMP normal  10/14 Hb 9.6, mcv 80 ow cbc nl, cr 1.51, alb 3.5 ow cmp nl, FLP nl  7/14 hemoglobin 8.6, MCV 80, platelets 573, WBC 5.4, glucose 140, BUN 42, creatinine 1.61 otherwise BMP normal 04/2012:  Hemoglobin 9.9, MCV 80, otherwise CBC normal.    Creatinine 1.62, otherwise CMP normal.    Fasting lipid panel normal.    ASSESSMENT/PLAN:  Diabetes mellitus with renal complications.  Well controlled.  Anemia of chronic kidney disease.  Hb improved.  Dementia.  Moderately advanced.    Renovascular hypertension. Well controlled  Hyperlipidemia.  Well controlled.     Insomnia.  Continue trazodone.    Vitamin B12 deficiency.  Continue supplementation.   CPT CODE: 93267  Gayani Y. Durwin Reges, [place] [phone]

## 2013-06-28 NOTE — Progress Notes (Signed)
Patient ID: [name], male   DOB: [birth year], 68 y.o.   MRN: [id number]            PROGRESS NOTE  DATE: 06/25/2013       FACILITY:  [facility]  LEVEL OF CARE: SNF (31)  Acute Visit  CHIEF COMPLAINT:  Manage increased lower extremity swelling.      HISTORY OF PRESENT ILLNESS: I was requested by the staff to assess the patient regarding above problem(s):  CHF:The staff does not relate significant weight changes, denies sob, DOE, orthopnea, PNDs, palpitations or chest pain.  Staff report increased bilateral lower extremity swelling since yesterday.  CHF is unstable.  No complications form the medications being used.  Patient is a poor historian.     PAST MEDICAL HISTORY : Reviewed.  No changes/see problem list  CURRENT MEDICATIONS: Reviewed per [facility]/see medication list  REVIEW OF SYSTEMS:  Unobtainable.  Patient is a poor historian.      PHYSICAL EXAMINATION  VS:  T 97.2       P 68      RR 18      BP 146/70           GENERAL: no acute distress, moderately obese body habitus NECK: supple, trachea midline, no neck masses, no thyroid tenderness, no thyromegaly RESPIRATORY: breathing is even & unlabored, BS CTAB CARDIAC: RRR, no murmur,no extra heart sounds, +3 bilateral lower extremity edema    GI: patient has colostomy, abdomen soft, normal BS, no masses, no tenderness, no hepatomegaly, no splenomegaly PSYCHIATRIC: the patient is alert & oriented to person, affect & behavior appropriate  LABS/RADIOLOGY: In 04/2013:  BUN 20, creatinine 1.48.    ASSESSMENT/PLAN:  CHF exacerbation.  Significant new problem.  Increase Lasix to 40 mg q.d. for four days, then decrease to 20 mg q.d.    Hypokalemia.  Increase KCl to 40 mEq q.d. for four days, then decrease to 20 mEq q.d.    CPT CODE: 79150       Gayani Y Dasanayaka, [place] [phone]

## 2013-08-06 ENCOUNTER — Non-Acute Institutional Stay (SKILLED_NURSING_FACILITY): Payer: Medicare Other | Admitting: Internal Medicine

## 2013-08-06 DIAGNOSIS — D631 Anemia in chronic kidney disease: Secondary | ICD-10-CM

## 2013-08-06 DIAGNOSIS — N039 Chronic nephritic syndrome with unspecified morphologic changes: Principal | ICD-10-CM

## 2013-08-06 DIAGNOSIS — I150 Renovascular hypertension: Secondary | ICD-10-CM

## 2013-08-06 DIAGNOSIS — F0280 Dementia in other diseases classified elsewhere without behavioral disturbance: Secondary | ICD-10-CM

## 2013-08-06 DIAGNOSIS — E1129 Type 2 diabetes mellitus with other diabetic kidney complication: Secondary | ICD-10-CM

## 2013-08-06 DIAGNOSIS — G309 Alzheimer's disease, unspecified: Secondary | ICD-10-CM

## 2013-08-06 NOTE — Progress Notes (Signed)
Patient ID: [name], male   DOB: 1945-03-27, 68 y.o.   MRN: [id number]         PROGRESS NOTE  DATE: 08/06/2013  FACILITY: Nursing Home Location: [facility]  LEVEL OF CARE: SNF (31)  Routine Visit  CHIEF COMPLAINT:  Manage anemia of chronic kidney disease, diabetes mellitus, and dementia.    HISTORY OF PRESENT ILLNESS:  REASSESSMENT OF ONGOING PROBLEM(S):  DEMENTIA: The dementia remains stable and continues to function adequately in the current living environment with supervision.  The patient has had little changes in behavior. No complications noted from the medications presently being used.  Patient is a poor historian.    ANEMIA: The anemia has been stable. The staff deny fatigue, melena or hematochezia. No complications from the medications currently being used.  The anemia is secondary to chronic kidney disease.  On 05/09/2012, hemoglobin was 9.9, MCV 80.  On 04/24/2012, hemoglobin was 9.8, in 7/14 hemoglobin 8.6, MCV 80.  In 10/14 Hb 9.6, mcv 80, 12-14 hemoglobin 11.2, MCV 79, in 4-15 hemoglobin 10.8, MCV 79  DM:pt's DM remains stable.  Staff deny polyuria, polydipsia, polyphagia, changes in vision or hypoglycemic episodes.  No complications noted from the medication presently being used.  Last hemoglobin A1c is:  04/2012:  Hemoglobin A1C 6.7, in 7/14 hemoglobin A1c 6.6, in 10/14 HbA1c 6.6, in 4-15 hemoglobin A1c 6.4, in 7-15 hemoglobin A1c 6.4  PAST MEDICAL HISTORY : Reviewed.  No changes.  CURRENT MEDICATIONS: Reviewed per [facility]  REVIEW OF SYSTEMS:  Difficult to obtain due to dementia.    PHYSICAL EXAMINATION  VS: See vital sign section  GENERAL: no acute distress, normal body habitus NECK: supple, trachea midline, no neck masses, no thyroid tenderness, no thyromegaly RESPIRATORY: breathing is even & unlabored, BS CTAB CARDIAC: RRR, no murmur,no extra heart sounds EDEMA/VARICOSITIES:  +2 bilateral lower extremity edema  ARTERIAL: pedal pulses  nonpalpable  GI: abdomen soft, normal BS, no masses, no tenderness, no hepatomegaly, no splenomegaly, has colostomy PSYCHIATRIC: the patient is alert & oriented to person, affect & behavior appropriate  LABS/RADIOLOGY: 4-15 hemoglobin 10.8, MCV 79 otherwise CBC normal, glucose 108, creatinine 1.48 otherwise CMP normal, triglycerides 158 otherwise fasting lipid panel normal 12-14 hemoglobin 11.2, MCV 79 otherwise CBC normal, creatinine 1.48 otherwise BMP normal  10/14 Hb 9.6, mcv 80 ow cbc nl, cr 1.51, alb 3.5 ow cmp nl, FLP nl  7/14 hemoglobin 8.6, MCV 80, platelets 573, WBC 5.4, glucose 140, BUN 42, creatinine 1.61 otherwise BMP normal 04/2012:  Hemoglobin 9.9, MCV 80, otherwise CBC normal.    Creatinine 1.62, otherwise CMP normal.    Fasting lipid panel normal.    ASSESSMENT/PLAN:  Diabetes mellitus with renal complications.  Well controlled.  Anemia of chronic kidney disease.  Hb improved.  Dementia.  Moderately advanced. Seroquel was increased   Renovascular hypertension. Last blood pressure elevated. We'll review a log.  Hyperlipidemia.  Well controlled.     Insomnia.  Continue trazodone.    Vitamin B12 deficiency.  Continue supplementation.   CPT CODE: 14970  Gayani Y. Durwin Reges, [place] [phone]

## 2013-08-08 ENCOUNTER — Non-Acute Institutional Stay (SKILLED_NURSING_FACILITY): Payer: Medicare Other | Admitting: Internal Medicine

## 2013-08-08 DIAGNOSIS — E876 Hypokalemia: Secondary | ICD-10-CM

## 2013-08-08 DIAGNOSIS — R609 Edema, unspecified: Secondary | ICD-10-CM

## 2013-08-09 DIAGNOSIS — R609 Edema, unspecified: Secondary | ICD-10-CM | POA: Insufficient documentation

## 2013-08-09 NOTE — Progress Notes (Signed)
         PROGRESS NOTE  DATE: 08/08/2013  FACILITY:  [facility]  LEVEL OF CARE: SNF (31)  Acute Visit  CHIEF COMPLAINT:  Manage lower extremity swelling  HISTORY OF PRESENT ILLNESS: I was requested by the staff to assess the patient regarding above problem(s):  EDEMA: The patient's edema is unstable. Patient's son complains of  increasing lower extremity swelling. Staff deny chest pain or shortness of breath. No complications reported from the medications currently being used. Patient is a poor historian dementia. Staff reports that patient is very noncompliant with diet.  PAST MEDICAL HISTORY : Reviewed.  No changes/see problem list  CURRENT MEDICATIONS: Reviewed per MAR/see medication list  REVIEW OF SYSTEMS: Unobtainable due to dementia  PHYSICAL EXAMINATION  VS: see VS section  GENERAL: no acute distress, moderately obese body habitus NECK: supple, trachea midline, no neck masses, no thyroid tenderness, no thyromegaly RESPIRATORY: breathing is even & unlabored, BS CTAB CARDIAC: RRR, no murmur,no extra heart sounds, +2 bilateral lower extremity edema GI: abdomen soft, normal BS, no masses, no tenderness, no hepatomegaly, no splenomegaly PSYCHIATRIC: the patient is alert & oriented to person, affect & behavior appropriate  ASSESSMENT/PLAN:  Lower extremity edema-unstable problem. Increase Lasix to 40 mg daily for 4 days then decrease to 20 mg daily. Hypokalemia-increase KCL to 40 meq  qdaily for 4 days and then decrease to 20 mEq daily.  CPT CODE: 10175  [name] Y Temitope Griffing, [place] [phone]

## 2013-09-12 ENCOUNTER — Non-Acute Institutional Stay (SKILLED_NURSING_FACILITY): Payer: Medicare Other | Admitting: Internal Medicine

## 2013-09-12 DIAGNOSIS — N039 Chronic nephritic syndrome with unspecified morphologic changes: Principal | ICD-10-CM

## 2013-09-12 DIAGNOSIS — E1129 Type 2 diabetes mellitus with other diabetic kidney complication: Secondary | ICD-10-CM

## 2013-09-12 DIAGNOSIS — F0280 Dementia in other diseases classified elsewhere without behavioral disturbance: Secondary | ICD-10-CM

## 2013-09-12 DIAGNOSIS — I150 Renovascular hypertension: Secondary | ICD-10-CM

## 2013-09-12 DIAGNOSIS — D631 Anemia in chronic kidney disease: Secondary | ICD-10-CM

## 2013-09-12 DIAGNOSIS — G309 Alzheimer's disease, unspecified: Secondary | ICD-10-CM

## 2013-09-14 NOTE — Progress Notes (Signed)
Patient ID: [name], male   DOB: [birth year], 68 y.o.   MRN: [id number]         PROGRESS NOTE  DATE: 09/12/2013  FACILITY: Nursing Home Location: [facility]  LEVEL OF CARE: SNF (31)  Routine Visit  CHIEF COMPLAINT:  Manage anemia of chronic kidney disease, diabetes mellitus, and dementia.    HISTORY OF PRESENT ILLNESS:  REASSESSMENT OF ONGOING PROBLEM(S):  DEMENTIA: The dementia remains stable and continues to function adequately in the current living environment with supervision.  The patient has had little changes in behavior. No complications noted from the medications presently being used.  Patient is a poor historian.    ANEMIA: The anemia has been stable. The staff deny fatigue, melena or hematochezia. No complications from the medications currently being used.  The anemia is secondary to chronic kidney disease.  On 05/09/2012, hemoglobin was 9.9, MCV 80.  On 04/24/2012, hemoglobin was 9.8, in 7/14 hemoglobin 8.6, MCV 80.  In 10/14 Hb 9.6, mcv 80, 12-14 hemoglobin 11.2, MCV 79, in 4-15 hemoglobin 10.8, MCV 79  DM:pt's DM remains stable.  Staff deny polyuria, polydipsia, polyphagia, changes in vision or hypoglycemic episodes.  No complications noted from the medication presently being used.  Last hemoglobin A1c is:  04/2012:  Hemoglobin A1C 6.7, in 7/14 hemoglobin A1c 6.6, in 10/14 HbA1c 6.6, in 4-15 hemoglobin A1c 6.4, in 7-15 hemoglobin A1c 6.4  PAST MEDICAL HISTORY : Reviewed.  No changes.  CURRENT MEDICATIONS: Reviewed per [facility]  REVIEW OF SYSTEMS:  Difficult to obtain due to dementia.    PHYSICAL EXAMINATION  VS: See vital sign section  GENERAL: no acute distress, normal body habitus NECK: supple, trachea midline, no neck masses, no thyroid tenderness, no thyromegaly RESPIRATORY: breathing is even & unlabored, BS CTAB CARDIAC: RRR, no murmur,no extra heart sounds EDEMA/VARICOSITIES:  +2 bilateral lower extremity edema  ARTERIAL: pedal pulses  nonpalpable  GI: abdomen soft, normal BS, no masses, no tenderness, no hepatomegaly, no splenomegaly, has colostomy PSYCHIATRIC: the patient is alert & oriented to person, affect & behavior appropriate  LABS/RADIOLOGY: 4-15 hemoglobin 10.8, MCV 79 otherwise CBC normal, glucose 108, creatinine 1.48 otherwise CMP normal, triglycerides 158 otherwise fasting lipid panel normal 12-14 hemoglobin 11.2, MCV 79 otherwise CBC normal, creatinine 1.48 otherwise BMP normal  10/14 Hb 9.6, mcv 80 ow cbc nl, cr 1.51, alb 3.5 ow cmp nl, FLP nl  7/14 hemoglobin 8.6, MCV 80, platelets 573, WBC 5.4, glucose 140, BUN 42, creatinine 1.61 otherwise BMP normal 04/2012:  Hemoglobin 9.9, MCV 80, otherwise CBC normal.    Creatinine 1.62, otherwise CMP normal.    Fasting lipid panel normal.    ASSESSMENT/PLAN:  Diabetes mellitus with renal complications.  Well controlled.  Anemia of chronic kidney disease.  Hb improved.  Dementia.  Moderately advanced.   Renovascular hypertension. Well controlled.  Hyperlipidemia.  Well controlled.     Insomnia.  Continue trazodone.    Vitamin B12 deficiency.  Continue supplementation.   CPT CODE: 63785  Gayani Y. Durwin Reges, [place] [phone]

## 2013-10-29 ENCOUNTER — Non-Acute Institutional Stay (SKILLED_NURSING_FACILITY): Payer: Medicare Other | Admitting: Internal Medicine

## 2013-10-29 DIAGNOSIS — D631 Anemia in chronic kidney disease: Secondary | ICD-10-CM

## 2013-10-29 DIAGNOSIS — I150 Renovascular hypertension: Secondary | ICD-10-CM

## 2013-10-29 DIAGNOSIS — N189 Chronic kidney disease, unspecified: Secondary | ICD-10-CM

## 2013-10-29 DIAGNOSIS — E785 Hyperlipidemia, unspecified: Secondary | ICD-10-CM

## 2013-10-31 ENCOUNTER — Non-Acute Institutional Stay (SKILLED_NURSING_FACILITY): Payer: Medicare Other | Admitting: Internal Medicine

## 2013-10-31 DIAGNOSIS — I150 Renovascular hypertension: Secondary | ICD-10-CM

## 2013-10-31 DIAGNOSIS — D631 Anemia in chronic kidney disease: Secondary | ICD-10-CM

## 2013-10-31 DIAGNOSIS — E1122 Type 2 diabetes mellitus with diabetic chronic kidney disease: Secondary | ICD-10-CM

## 2013-10-31 DIAGNOSIS — G309 Alzheimer's disease, unspecified: Secondary | ICD-10-CM

## 2013-10-31 DIAGNOSIS — N189 Chronic kidney disease, unspecified: Secondary | ICD-10-CM

## 2013-10-31 DIAGNOSIS — F0280 Dementia in other diseases classified elsewhere without behavioral disturbance: Secondary | ICD-10-CM

## 2013-11-02 DIAGNOSIS — E785 Hyperlipidemia, unspecified: Secondary | ICD-10-CM | POA: Insufficient documentation

## 2013-11-02 NOTE — Progress Notes (Signed)
Patient ID: [name], male   DOB: 1945-09-11, 68 y.o.   MRN: [id number]           PROGRESS NOTE  DATE: 10/29/2013          FACILITY:  [facility]  LEVEL OF CARE: SNF (31)  Acute Visit  CHIEF COMPLAINT:  Manage chronic kidney disease, hyperlipidemia, and anemia of chronic kidney disease.         HISTORY OF PRESENT ILLNESS: I was requested by the staff to assess the patient regarding above problem(s):  CHRONIC KIDNEY DISEASE: The patient's chronic kidney disease is unstable.  Staff denies increasing lower extremity swelling or confusion. Last BUN and creatinine are:   On 10/24/2013:  BUN 25, creatinine 1.74.  In 04/2013:  Creatinine 1.48.  Patient is a poor historian.           HYPERLIPIDEMIA: No complications from the medications presently being used. Last fasting lipid panel showed :  On 10/24/2013:  Triglycerides 174, otherwise fasting lipid panel normal.         ANEMIA: The anemia has been stable. The patient denies fatigue, melena or hematochezia. No complications from the medications currently being used.  On 10/24/2013:  Hemoglobin 10.8, MCV 81.   PAST MEDICAL HISTORY : Reviewed.  No changes/see problem list  CURRENT MEDICATIONS: Reviewed per MAR/see medication list  REVIEW OF SYSTEMS:   Unobtainable.   Patient overall is a poor historian.        PHYSICAL EXAMINATION  VS: see VS section  GENERAL: no acute distress, moderately obese body habitus EYES: conjunctivae normal, sclerae normal, normal eye lids NECK: supple, trachea midline, no neck masses, no thyroid tenderness, no thyromegaly LYMPHATICS: no LAN in the neck, no supraclavicular LAN RESPIRATORY: breathing is even & unlabored, BS CTAB CARDIAC: RRR, no murmur,no extra heart sounds, 2+ bilateral lower extremity edema       GI: abdomen soft, normal BS, no masses, no tenderness, no hepatomegaly, no splenomegaly PSYCHIATRIC: the patient is alert, unable to assess orientation, affect & behavior  appropriate  ASSESSMENT/PLAN:  Chronic kidney disease.  Unstable problem.  Renal functions are worse.  Patient is on Lasix.  Recheck on 11/02/2013.    Anemia of chronic kidney disease.  Hemoglobin stable.    Hyperlipidemia.  Adequately controlled.    Renovascular hypertension.  Blood pressure elevated.  We will review a log.    CPT CODE: 44975          Yanitza Shvartsman Y Gresham Caetano, [place] [phone]

## 2013-11-03 DIAGNOSIS — E119 Type 2 diabetes mellitus without complications: Secondary | ICD-10-CM | POA: Insufficient documentation

## 2013-11-03 NOTE — Progress Notes (Signed)
Patient ID: [name], male   DOB: Feb 19, 1945, 68 y.o.   MRN: [id number]         PROGRESS NOTE  DATE: 10/31/2013  FACILITY: Nursing Home Location: [facility]  LEVEL OF CARE: SNF (31)  Routine Visit  CHIEF COMPLAINT:  Manage anemia of chronic kidney disease, diabetes mellitus, and dementia.    HISTORY OF PRESENT ILLNESS:  REASSESSMENT OF ONGOING PROBLEM(S):  DEMENTIA: The dementia remains stable and continues to function adequately in the current living environment with supervision.  The patient has had little changes in behavior. No complications noted from the medications presently being used.  Patient is a poor historian.    ANEMIA: The anemia has been stable. The staff deny fatigue, melena or hematochezia. No complications from the medications currently being used.  The anemia is secondary to chronic kidney disease.  On 05/09/2012, hemoglobin was 9.9, MCV 80.  On 04/24/2012, hemoglobin was 9.8, in 7/14 hemoglobin 8.6, MCV 80.  In 10/14 Hb 9.6, mcv 80, 12-14 hemoglobin 11.2, MCV 79, in 4-15 hemoglobin 10.8, MCV 79  DM:pt's DM remains stable.  Staff deny polyuria, polydipsia, polyphagia, changes in vision or hypoglycemic episodes.  No complications noted from the medication presently being used.  Last hemoglobin A1c is:  04/2012:  Hemoglobin A1C 6.7, in 7/14 hemoglobin A1c 6.6, in 10/14 HbA1c 6.6, in 4-15 hemoglobin A1c 6.4, in 7-15 hemoglobin A1c 6.4  PAST MEDICAL HISTORY : Reviewed.  No changes.  CURRENT MEDICATIONS: Reviewed per [facility]  REVIEW OF SYSTEMS:  Difficult to obtain due to dementia.    PHYSICAL EXAMINATION  VS: See vital sign section  GENERAL: no acute distress, normal body habitus EYES: normal sclerae, normal conjunctivae, no discharge NECK: supple, trachea midline, no neck masses, no thyroid tenderness, no thyromegaly LYMPHATICS: no cervical LAN, no supraclavicular LAN RESPIRATORY: breathing is even & unlabored, BS CTAB CARDIAC: RRR, no  murmur,no extra heart sounds EDEMA/VARICOSITIES:  +3 bilateral lower extremity edema  ARTERIAL: pedal pulses nonpalpable  GI: abdomen soft, normal BS, no masses, no tenderness, no hepatomegaly, no splenomegaly, has colostomy PSYCHIATRIC: the patient is alert & oriented to person, affect & behavior appropriate  LABS/RADIOLOGY: 4-15 hemoglobin 10.8, MCV 79 otherwise CBC normal, glucose 108, creatinine 1.48 otherwise CMP normal, triglycerides 158 otherwise fasting lipid panel normal 12-14 hemoglobin 11.2, MCV 79 otherwise CBC normal, creatinine 1.48 otherwise BMP normal  10/14 Hb 9.6, mcv 80 ow cbc nl, cr 1.51, alb 3.5 ow cmp nl, FLP nl  7/14 hemoglobin 8.6, MCV 80, platelets 573, WBC 5.4, glucose 140, BUN 42, creatinine 1.61 otherwise BMP normal 04/2012:  Hemoglobin 9.9, MCV 80, otherwise CBC normal.    Creatinine 1.62, otherwise CMP normal.    Fasting lipid panel normal.    ASSESSMENT/PLAN:  Diabetes mellitus with renal complications.  Well controlled.  Anemia of chronic kidney disease.  Check Hb.  Dementia.  Moderately advanced.   Renovascular hypertension. Uncontrolled.  Start hydralazine 15mg  tid.  Hyperlipidemia.  Well controlled. Check FLP  Insomnia.  Continue trazodone.    Vitamin B12 deficiency.  Continue supplementation.   Check cbc & cmp  CPT CODE: 41324  Dannell Gortney Y. Durwin Reges, [place] [phone]

## 2013-11-07 ENCOUNTER — Non-Acute Institutional Stay (SKILLED_NURSING_FACILITY): Payer: Medicare Other | Admitting: Internal Medicine

## 2013-11-07 DIAGNOSIS — N189 Chronic kidney disease, unspecified: Secondary | ICD-10-CM

## 2013-11-09 NOTE — Progress Notes (Signed)
Patient ID: [name], male   DOB: Dec 13, 1945, 68 y.o.   MRN: [id number]           PROGRESS NOTE  DATE: 11/07/2013         FACILITY:  [facility]  LEVEL OF CARE: SNF (31)  Acute Visit  CHIEF COMPLAINT:  Manage chronic kidney disease.       HISTORY OF PRESENT ILLNESS: I was requested by the staff to assess the patient regarding above problem(s):  CHRONIC KIDNEY DISEASE: The patient's chronic kidney disease remains stable.  Staff denies increasing lower extremity swelling or confusion. Last BUN and creatinine are:   On 11/02/2013:  BUN 23, creatinine 1.68.  On 10/24/2013:  Creatinine 1.74.  Patient is a poor historian due to dementia.    PAST MEDICAL HISTORY : Reviewed.  No changes/see problem list  CURRENT MEDICATIONS: Reviewed per MAR/see medication list  PHYSICAL EXAMINATION  VS: see VS section  GENERAL: no acute distress, moderately obese body habitus RESPIRATORY: breathing is even & unlabored, BS CTAB CARDIAC: RRR, no murmur,no extra heart sounds, +4 bilateral lower extremity edema       ASSESSMENT/PLAN:  Chronic kidney disease.  Renal functions improved.    CPT CODE: 23557          Jenessa Gillingham Y Yerik Zeringue, [place] [phone]

## 2014-03-31 ENCOUNTER — Encounter (HOSPITAL_COMMUNITY): Payer: Self-pay

## 2014-03-31 ENCOUNTER — Emergency Department (HOSPITAL_COMMUNITY)
Admission: EM | Admit: 2014-03-31 | Discharge: 2014-03-31 | Disposition: A | Discharge status: Home / Self Care | Payer: Medicare Other | Attending: Emergency Medicine | Admitting: Emergency Medicine

## 2014-03-31 DIAGNOSIS — Z9049 Acquired absence of other specified parts of digestive tract: Secondary | ICD-10-CM | POA: Diagnosis not present

## 2014-03-31 DIAGNOSIS — F0390 Unspecified dementia without behavioral disturbance: Secondary | ICD-10-CM | POA: Diagnosis not present

## 2014-03-31 DIAGNOSIS — H9190 Unspecified hearing loss, unspecified ear: Secondary | ICD-10-CM | POA: Insufficient documentation

## 2014-03-31 DIAGNOSIS — Z87448 Personal history of other diseases of urinary system: Secondary | ICD-10-CM | POA: Insufficient documentation

## 2014-03-31 DIAGNOSIS — Z794 Long term (current) use of insulin: Secondary | ICD-10-CM | POA: Diagnosis not present

## 2014-03-31 DIAGNOSIS — F22 Delusional disorders: Secondary | ICD-10-CM | POA: Insufficient documentation

## 2014-03-31 DIAGNOSIS — F329 Major depressive disorder, single episode, unspecified: Secondary | ICD-10-CM | POA: Insufficient documentation

## 2014-03-31 DIAGNOSIS — Z87440 Personal history of urinary (tract) infections: Secondary | ICD-10-CM | POA: Insufficient documentation

## 2014-03-31 DIAGNOSIS — D649 Anemia, unspecified: Secondary | ICD-10-CM | POA: Insufficient documentation

## 2014-03-31 DIAGNOSIS — Z79899 Other long term (current) drug therapy: Secondary | ICD-10-CM | POA: Insufficient documentation

## 2014-03-31 DIAGNOSIS — Z9181 History of falling: Secondary | ICD-10-CM | POA: Insufficient documentation

## 2014-03-31 DIAGNOSIS — K9423 Gastrostomy malfunction: Secondary | ICD-10-CM | POA: Diagnosis present

## 2014-03-31 DIAGNOSIS — E785 Hyperlipidemia, unspecified: Secondary | ICD-10-CM | POA: Diagnosis not present

## 2014-03-31 DIAGNOSIS — Z9889 Other specified postprocedural states: Secondary | ICD-10-CM | POA: Diagnosis not present

## 2014-03-31 DIAGNOSIS — E119 Type 2 diabetes mellitus without complications: Secondary | ICD-10-CM | POA: Insufficient documentation

## 2014-03-31 DIAGNOSIS — I10 Essential (primary) hypertension: Secondary | ICD-10-CM | POA: Diagnosis not present

## 2014-03-31 DIAGNOSIS — I509 Heart failure, unspecified: Secondary | ICD-10-CM | POA: Diagnosis not present

## 2014-03-31 DIAGNOSIS — Z7982 Long term (current) use of aspirin: Secondary | ICD-10-CM | POA: Insufficient documentation

## 2014-03-31 DIAGNOSIS — Z8673 Personal history of transient ischemic attack (TIA), and cerebral infarction without residual deficits: Secondary | ICD-10-CM | POA: Diagnosis not present

## 2014-03-31 NOTE — ED Notes (Signed)
Per PTAR, pt from NH, the port on the end of the pt's peg tube came off and was discovered by the Nurse at 0400 this morning and the pt was unable to receive meds and feeding. Pt has hx of delusions and not following commands. Pt is not in any acute distress.

## 2014-03-31 NOTE — ED Notes (Signed)
Called transport for patient 

## 2014-03-31 NOTE — Progress Notes (Signed)
Called to eval pt damaged G-tube. Originally placed in 2014  'pull-through' G-tube . Tubing has been cut/damaged near base of hub.  Tubing was cut at a clean section, and new hub installed. Tube flushed with 75mL saline without issue. Tube ready for use. Pt may be discharged back to SNF.  Ascencion Dike PA-C Interventional Radiology 03/31/2014 8:39 AM

## 2014-03-31 NOTE — ED Provider Notes (Signed)
CSN: [id number]     Arrival date & time 03/31/14  7517 History   First MD Initiated Contact with Patient 03/31/14 [phone]     Chief Complaint  Patient presents with  . GI Problem      Patient is a 69 y.o. male presenting with GI illness. The history is provided by the EMS personnel, the nursing home and the patient. The history is limited by the condition of the patient (Hx dementia).  GI Problem  Pt was seen at 0720. Per EMS and NH report: NH states they noticed the pt's PEG tube near the port end was "broken" at 0400 this morning PTA. Pt was unable to receive his meds or feedings due to the broken tubing. Pt was sent to the ED for replacement of his PEG tube. Pt himself denies any complaints.     Past Medical History  Diagnosis Date  . Hyponatremia   . Hypokalemia   . Anemia   . Diabetes mellitus without complication   . Hypertension   . UTI (lower urinary tract infection)   . Delusional disorder   . HOH (hard of hearing)   . Hyperlipidemia   . CHF (congestive heart failure)   . Renal insufficiency   . Stroke   . Dementia   . Depression   . Dysphagia   . Falls frequently    Past Surgical History  Procedure Laterality Date  . Colon surgery    . Colectomy    . Ileostomy    . Peg tube removal    . Cardiac catheterization    . Cholecystectomy    . Abdominal adhesion surgery      History  Substance Use Topics  . Smoking status: Never Smoker   . Smokeless tobacco: Not on file  . Alcohol Use: No    Review of Systems  Unable to perform ROS: Dementia      Allergies  Review of patient's allergies indicates no known allergies.  Home Medications   Prior to Admission medications   Medication Sig Start Date End Date Taking? Authorizing Provider  acetaminophen (TYLENOL) 500 MG tablet Take 500 mg by mouth every 6 (six) hours as needed for pain.   Yes Historical Provider, MD  Amino Acids-Protein Hydrolys (FEEDING SUPPLEMENT, PRO-STAT SUGAR FREE 64,) LIQD Place 30 mLs  into feeding tube 2 (two) times daily.   Yes Historical Provider, MD  amLODipine (NORVASC) 10 MG tablet Give 10 mg by tube daily.   Yes Historical Provider, MD  aspirin 81 MG chewable tablet Give 81 mg by tube daily.   Yes Historical Provider, MD  atorvastatin (LIPITOR) 20 MG tablet Give 20 mg by tube daily.   Yes Historical Provider, MD  cyanocobalamin (,VITAMIN B-12,) 1000 MCG/ML injection Inject 1,000 mcg into the muscle every 30 (thirty) days.   Yes Historical Provider, MD  docusate (COLACE) 50 MG/5ML liquid Place 200 mg into feeding tube daily.   Yes Historical Provider, MD  donepezil (ARICEPT) 10 MG tablet Give 10 mg by tube at bedtime.    Yes Historical Provider, MD  ferrous sulfate 220 (44 FE) MG/5ML solution Place 330 mg into feeding tube daily.   Yes Historical Provider, MD  furosemide (LASIX) 20 MG tablet Give 20 mg by tube daily.    Yes Historical Provider, MD  hydrALAZINE (APRESOLINE) 10 MG tablet Take 15 mg by mouth 4 (four) times daily.   Yes Historical Provider, MD  insulin detemir (LEVEMIR) 100 UNIT/ML injection Inject 5 Units into the skin  at bedtime.   Yes Historical Provider, MD  insulin lispro (HUMALOG) 100 UNIT/ML injection Inject 5 Units into the skin 3 (three) times daily as needed for high blood sugar.   Yes Historical Provider, MD  Multiple Vitamin (MULTIVITAMIN) LIQD Give 5 mLs by tube daily.   Yes Historical Provider, MD  potassium chloride (KCL) 2 mEq/mL SOLN oral liquid Take 10 mLs (20 mEq total) by mouth daily. Per tube 07/14/12  Yes Barton Dubois, MD  QUEtiapine (SEROQUEL) 25 MG tablet Give 25 mg by tube daily.    Yes Historical Provider, MD  sertraline (ZOLOFT) 50 MG tablet Give 150 mg by tube daily.    Yes Historical Provider, MD  traZODone (DESYREL) 50 MG tablet Give 25 mg by tube 2 (two) times daily.    Yes Historical Provider, MD  trolamine salicylate (ASPERCREME) 10 % cream Apply 1 application topically 2 (two) times daily. Applied to shoulder.   Yes Historical  Provider, MD  vitamin C (ASCORBIC ACID) 500 MG tablet Give 1,000 mg by tube 2 (two) times daily.    Yes Historical Provider, MD  Water For Irrigation, Sterile (FREE WATER) SOLN Place 120 mLs into feeding tube every 4 (four) hours. 07/14/12  Yes Barton Dubois, MD  aspirin EC 81 MG tablet Take 81 mg by mouth daily.    Historical Provider, MD  docusate sodium (COLACE) 100 MG capsule Take 200 mg by mouth daily.     Historical Provider, MD  fenofibrate (TRICOR) 48 MG tablet Take 48 mg by mouth daily.    Historical Provider, MD  ferrous sulfate 325 (65 FE) MG tablet Take 325 mg by mouth daily with breakfast.    Historical Provider, MD  Multiple Vitamin (MULTIVITAMIN WITH MINERALS) TABS Take 1 tablet by mouth daily.    Historical Provider, MD  niacin-simvastatin [facility]) 500-20 MG 24 hr tablet Take 1 tablet by mouth at bedtime.    Historical Provider, MD  Nutritional Supplements (FEEDING SUPPLEMENT, OSMOLITE 1.2 CAL,) LIQD Place 1,000 mLs into feeding tube continuous. Initiate Osmolite 1.2 @ 20 ml/hr via PEG and increase by 10 ml every 4 hours to goal rate of 70 ml/hr. At goal rate, tube feeding regimen will provide 2016 kcal, 93 grams of protein, and 1378 ml of H2O. Provide 120 ml free water flushes q 4 hours. 07/14/12   Barton Dubois, MD  tamsulosin (FLOMAX) 0.4 MG CAPS Take 0.4 mg by mouth daily.    Historical Provider, MD   BP 159/77 mmHg  Pulse 56  Temp(Src) 98.2 F (36.8 C) (Oral)  Resp 16  SpO2 100% Physical Exam  0725: Physical examination:  Nursing notes reviewed; Vital signs and O2 SAT reviewed;  Constitutional: Well developed, Well nourished, Well hydrated, In no acute distress; Head:  Normocephalic, atraumatic; Eyes: EOMI, PERRL, No scleral icterus; ENMT: Mouth and pharynx normal, Mucous membranes moist; Neck: Supple, Full range of motion, No lymphadenopathy; Cardiovascular: Regular rate and rhythm, No gallop; Respiratory: Breath sounds clear & equal bilaterally, No wheezes.  Speaking full  sentences with ease, Normal respiratory effort/excursion; Chest: Nontender, Movement normal; Abdomen: Soft, Nontender, Nondistended, Normal bowel sounds; Genitourinary: No CVA tenderness; Extremities: Pulses normal, +PEG tubing completely broken just proximal to port. No tenderness, No edema, No calf edema or asymmetry.; Neuro: Awake, alert, confused per hx dementia. Major CN grossly intact.  Speech clear. Moves all extremities on stretcher.; Skin: Color normal, Warm, Dry.    ED Course  Procedures     EKG Interpretation None  MDM  MDM Reviewed: previous chart, nursing note and vitals     [phone]:  IR staff has come to evaluate pt in the ED: they replaced pt's port at the bedside. Pt continues NAD, non-toxic appearing. Will d/c back to NH stable.   Francine Graven, DO 04/03/14 1351

## 2014-03-31 NOTE — ED Notes (Signed)
PTAR here to transport pt back to [place]. 

## 2014-03-31 NOTE — ED Notes (Signed)
Upon assessment with Dr Sharol Given, the end of the pt's peg tube is cut. Pt is not in any pain or distress.

## 2014-03-31 NOTE — Discharge Instructions (Signed)
°Emergency Department Resource Guide °1) Find a Doctor and Pay Out of Pocket °Although you won't have to find out who is covered by your insurance plan, it is a good idea to ask around and get recommendations. You will then need to call the office and see if the doctor you have chosen will accept you as a new patient and what types of options they offer for patients who are self-pay. Some doctors offer discounts or will set up payment plans for their patients who do not have insurance, but you will need to ask so you aren't surprised when you get to your appointment. ° °2) Contact Your Local Health Department °Not all health departments have doctors that can see patients for sick visits, but many do, so it is worth a call to see if yours does. If you don't know where your local health department is, you can check in your phone book. The CDC also has a tool to help you locate your state's health department, and many state websites also have listings of all of their local health departments. ° °3) Find a Walk-in Clinic °If your illness is not likely to be very severe or complicated, you may want to try a walk in clinic. These are popping up all over the country in pharmacies, drugstores, and shopping centers. They're usually staffed by nurse practitioners or physician assistants that have been trained to treat common illnesses and complaints. They're usually fairly quick and inexpensive. However, if you have serious medical issues or chronic medical problems, these are probably not your best option. ° °No Primary Care Doctor: °- Call [facility] at  832-8000 - they can help you locate a primary care doctor that  accepts your insurance, provides certain services, etc. °- Physician Referral Service- 1-800-533-3463 ° °Chronic Pain Problems: °Organization         Address  Phone   Notes  °[facility]  (336) [phone] Patients need to be referred by their primary care doctor.  ° °Medication  Assistance: °Organization         Address  Phone   Notes  °[place] [facility] [address] °[place], [place] [address] (336) 641-8030 --Must be a resident of [place] °-- Must have NO insurance coverage whatsoever (no Medicaid/ Medicare, etc.) °-- The pt. MUST have a primary care doctor that directs their care regularly and follows them in the community °  °[facility]  (866) 331-1348   °[employer]  (888) 892-1162   ° °Agencies that provide inexpensive medical care: °Organization         Address  Phone   Notes  °[facility]  (336) 832-8035   °[facility]    (336) 832-7272   °[facility] [address] °[place], [place] [address] (336) 832-4777   °[facility] [address], °[place] (336) 271-4999   °[facility]    (336) 373-0678   °[facility]    (336) 272-1050   °[facility] ° [address], [place] Phone:  (336) 832-4444, Fax:  (336) 832-4440 Hours of Operation:  9 am - 6 pm, M-F.  Also accepts Medicaid/Medicare and self-pay.  °[facility] ° [address], [place] Phone: (336) [phone], Fax: (336) [phone]. Hours of Operation:  8:30 am - 5:30 pm, M-F.  Also accepts Medicaid and self-pay.  °[facility] [place] [address], [place] Phone: (336) 878-6027   °[facility] [address], [place], [place] (336)723-1848, Ext. 123 Mondays & Thursdays: 7-9 AM.  First 15 patients are seen on a first come, first serve basis. °  ° °Medicaid-accepting [place] Providers: ° °Organization         Address  Phone   Notes  °[facility] [address], [place] (336) 641-2100 Also accepts self-pay patients.  °[facility] [address], [place] ° (336) 856-9996   °[facility] [address], [place]  (336) 288-8857   °[facility] [address] [place] [address], [place] (336) 299-7000   °Veita Bland [address], [place]  ° (336) 373-1557 Only accepts [place] Access Medicaid patients after they have their name applied to their card.  ° °Self-Pay (no insurance) in [place]: ° °Organization         Address  Phone   Notes  °Sickle Cell Patients, [facility] [address], [place] (336) 832-1970   °[facility] [address], [place] (336) 832-4400   °[facility] ° [address], [place] (336) 992-4800   °[facility]/Dr. Osei-Bonsu ° [address] [place] [address], [place] or [address], [place] (336) 841-8500 Phone number for both [place] and [place] locations is the same.  °[facility] [address], [place] (336) 299-0000   °[facility] [address] [place] [address], [place] or [address] (336) 852-7530 °(336) 878-2260   °[facility] [address], [place] (336) 350-1642, phone; (336) 294-5005, fax Sees patients 1st and 3rd Saturday of every month.  Must not qualify for public or private insurance (i.e. Medicaid, Medicare, [place] Health Choice, Veterans' Benefits) • Household income should be no more than 200% of the poverty level •The clinic cannot treat you if you are pregnant or think you are pregnant • Sexually transmitted diseases are not treated at the clinic.  ° ° °Dental Care: °Organization         Address  Phone  Notes  °[place] [facility] [address], [place] (336) 641-6152 Accepts children up to age 21 who are enrolled in Medicaid or [place] Health Choice; pregnant women with a Medicaid card; and children who have applied for Medicaid or [place] Health Choice, but were declined, whose parents can pay a reduced fee at time of service.  °[place]  [facility] [place]  [address], [place] (336) 641-7733 Accepts children up to age 21 who are enrolled in Medicaid or [place] Health Choice; pregnant women with a Medicaid card; and children who have applied for Medicaid or [place] Health Choice, but were declined, whose parents can pay a reduced fee at time of service.  °[facility] ° [address], [place] (336) 641-4533 Patients are seen by appointment only. Walk-ins are not accepted. [facility] will see patients 18 years of age and older. °Monday - Tuesday (8am-5pm) °Most Wednesdays (8:30-5pm) °$30 per visit, cash only  °[facility] ° [address], [place] (336) 641-4533 Patients are seen by appointment only. Walk-ins are not accepted. [facility] will see patients 18 years of age and older. °One   Wednesday Evening (Monthly: Volunteer Based).  $30 per visit, cash only  °[facility]  (919) 537-3737 for adults; Children under age 4, call Graduate Pediatric Dentistry at ([phone]) [phone]. Children aged 4-14, please call ([phone]) [phone] to request a pediatric application. ° Dental services are provided in all areas of dental care including fillings, crowns and bridges, complete and partial dentures, implants, gum treatment, root canals, and extractions. Preventive care is also provided. Treatment is provided to both adults and children. °Patients are selected via a lottery and there is often a waiting list. °  °[facility] [address], °[place] ° (336) 763-8833 [email or web address] °  °[facility] [address], [place], [place] (336)723-1848, Ext. 123 Second and Fourth Thursday of each month, opens at 6:30 AM; Clinic ends at 9 AM.  Patients are seen on a first-come first-served basis, and a limited number are seen during each clinic.  ° °[facility] ° [address], [place], [place] (336) 723-7904    Eligibility Requirements °You must have lived in [place], or [place] counties for at least the last three months. °  You cannot be eligible for state or federal sponsored healthcare insurance, including Veterans Administration, Medicaid, or Medicare. °  You generally cannot be eligible for healthcare insurance through your employer.  °  How to apply: °Eligibility screenings are held every Tuesday and Wednesday afternoon from 1:00 pm until 4:00 pm. You do not need an appointment for the interview!  °[facility] [address], [place], [place] 336-631-2330   °[facility]  336-342-8273   °[facility]  336-703-3100   °[facility]  336-570-6415   ° °Behavioral Health Resources in the Community: °Intensive Outpatient Programs °Organization         Address  Phone  Notes  °[facility] [address], [place], [place] 336-878-6098   °[facility] [address], [place], [place] 336-832-9800   °[facility] [address], [place], [place] ° 336-882-2125   °[facility] [address],  °[place], [place] 1-800-853-5163 or 336-641-4981   °Substance Abuse Resources °Organization         Address  Phone  Notes  °[facility]  336-882-2125   °[facility]  336-784-9470   °[facility]  336-285-9073   °[facility]  336-845-3988   °Residential & Outpatient Substance Abuse Program  1-800-659-3381   °Psychological Services °Organization         Address  Phone  Notes  °[facility]  336- 832-9600   °[facility]  336- 378-7881   °[facility] [address], [place] 1-800-853-5163 or 336-641-4981   ° °Mobile Crisis Teams °Organization         Address  Phone  Notes  °[facility], Mobile Crisis Care Unit  1-877-626-1772   °Assertive °[facility] ° [address]  [place], [place] 336-834-9664   °Sharon DeEsch [address] °[place] 336-554-5454   ° °Self-Help/Support Groups °Organization         Address  Phone             Notes  °[facility] - variety of support groups  336- 373-1402 Call for more information  °Narcotics Anonymous (NA), [facility] [address], °[place]  2 meetings at this location  ° °  Residential Treatment Programs Organization         Address  Phone  Notes  [facility] [address]    [place]  1-878-805-2865   [facility]  [address] [place] [address], [place], [place]   [facility] [place], [place] (815)782-1180 Admissions: 8am-3pm M-F  [facility] [place] [address]    [place], [place] 892-119-4174   [facility] [address] [place], [place], [place]   [facility] [address],  [place], [place]   [facility] - Intensive Outpatient [address], [place], [place]   [facility] [place]  [place], [place] 1-717-442-9267 or [phone]   [facility] [address], [place], [place] Accepts Medicaid  [facility] [place] [address]  [place] 1-9388278781 Substance Abuse/Addiction Treatment   [facility]         Address  Phone  Notes  [facility]  479-514-9302   Domenic Schwab, PhD [address] [place] [place], [place]   (281)076-6559 or 667-139-0803   [facility] [place], [place] [phone]   [facility] [address], [place], [place] 626-357-1554 Insurance/Medicaid/sponsorship through [facility] [address] [place]                                    [place], [place] [phone] [place] [address], [place] [phone]    Dr. Adele Schilder  820-665-3235   [facility] Dept. 1) 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 Take your usual prescriptions as previously directed.  Call your regular medical doctor tomorrow to schedule a follow up appointment within the next 2 days.  Return to the Emergency Department immediately sooner if worsening.

## 2014-08-08 ENCOUNTER — Encounter: Payer: Self-pay | Admitting: Gastroenterology

## 2014-09-14 ENCOUNTER — Other Ambulatory Visit: Payer: Self-pay

## 2014-09-20 ENCOUNTER — Inpatient Hospital Stay (HOSPITAL_COMMUNITY): Payer: Medicare Other

## 2014-09-20 ENCOUNTER — Emergency Department (HOSPITAL_COMMUNITY): Payer: Medicare Other

## 2014-09-20 ENCOUNTER — Inpatient Hospital Stay (HOSPITAL_COMMUNITY)
Admission: EM | Admit: 2014-09-20 | Discharge: 2014-09-23 | DRG: 291 | Disposition: A | Discharge status: Other Acute Inpatient Hospital | Payer: Medicare Other | Attending: Pulmonary Disease | Admitting: Pulmonary Disease

## 2014-09-20 ENCOUNTER — Encounter (HOSPITAL_COMMUNITY): Payer: Self-pay

## 2014-09-20 DIAGNOSIS — J9601 Acute respiratory failure with hypoxia: Secondary | ICD-10-CM | POA: Diagnosis present

## 2014-09-20 DIAGNOSIS — J189 Pneumonia, unspecified organism: Secondary | ICD-10-CM | POA: Diagnosis present

## 2014-09-20 DIAGNOSIS — Z85038 Personal history of other malignant neoplasm of large intestine: Secondary | ICD-10-CM

## 2014-09-20 DIAGNOSIS — Z79899 Other long term (current) drug therapy: Secondary | ICD-10-CM | POA: Diagnosis not present

## 2014-09-20 DIAGNOSIS — N179 Acute kidney failure, unspecified: Secondary | ICD-10-CM | POA: Diagnosis not present

## 2014-09-20 DIAGNOSIS — F0280 Dementia in other diseases classified elsewhere without behavioral disturbance: Secondary | ICD-10-CM | POA: Diagnosis present

## 2014-09-20 DIAGNOSIS — E872 Acidosis: Secondary | ICD-10-CM | POA: Diagnosis present

## 2014-09-20 DIAGNOSIS — Z933 Colostomy status: Secondary | ICD-10-CM | POA: Diagnosis not present

## 2014-09-20 DIAGNOSIS — E875 Hyperkalemia: Secondary | ICD-10-CM | POA: Diagnosis not present

## 2014-09-20 DIAGNOSIS — I5033 Acute on chronic diastolic (congestive) heart failure: Secondary | ICD-10-CM | POA: Diagnosis not present

## 2014-09-20 DIAGNOSIS — Z8673 Personal history of transient ischemic attack (TIA), and cerebral infarction without residual deficits: Secondary | ICD-10-CM

## 2014-09-20 DIAGNOSIS — R296 Repeated falls: Secondary | ICD-10-CM | POA: Diagnosis present

## 2014-09-20 DIAGNOSIS — N189 Chronic kidney disease, unspecified: Secondary | ICD-10-CM | POA: Diagnosis present

## 2014-09-20 DIAGNOSIS — I509 Heart failure, unspecified: Secondary | ICD-10-CM | POA: Diagnosis not present

## 2014-09-20 DIAGNOSIS — G309 Alzheimer's disease, unspecified: Secondary | ICD-10-CM | POA: Diagnosis present

## 2014-09-20 DIAGNOSIS — Z792 Long term (current) use of antibiotics: Secondary | ICD-10-CM | POA: Diagnosis not present

## 2014-09-20 DIAGNOSIS — I129 Hypertensive chronic kidney disease with stage 1 through stage 4 chronic kidney disease, or unspecified chronic kidney disease: Secondary | ICD-10-CM | POA: Diagnosis present

## 2014-09-20 DIAGNOSIS — I5032 Chronic diastolic (congestive) heart failure: Secondary | ICD-10-CM

## 2014-09-20 DIAGNOSIS — E1122 Type 2 diabetes mellitus with diabetic chronic kidney disease: Secondary | ICD-10-CM | POA: Diagnosis present

## 2014-09-20 DIAGNOSIS — Z931 Gastrostomy status: Secondary | ICD-10-CM | POA: Diagnosis not present

## 2014-09-20 DIAGNOSIS — N183 Chronic kidney disease, stage 3 unspecified: Secondary | ICD-10-CM | POA: Insufficient documentation

## 2014-09-20 DIAGNOSIS — R7989 Other specified abnormal findings of blood chemistry: Secondary | ICD-10-CM | POA: Diagnosis not present

## 2014-09-20 DIAGNOSIS — J441 Chronic obstructive pulmonary disease with (acute) exacerbation: Secondary | ICD-10-CM | POA: Diagnosis present

## 2014-09-20 DIAGNOSIS — R609 Edema, unspecified: Secondary | ICD-10-CM | POA: Diagnosis present

## 2014-09-20 DIAGNOSIS — E785 Hyperlipidemia, unspecified: Secondary | ICD-10-CM | POA: Diagnosis present

## 2014-09-20 DIAGNOSIS — D638 Anemia in other chronic diseases classified elsewhere: Secondary | ICD-10-CM | POA: Diagnosis present

## 2014-09-20 DIAGNOSIS — J45901 Unspecified asthma with (acute) exacerbation: Secondary | ICD-10-CM | POA: Diagnosis present

## 2014-09-20 DIAGNOSIS — F329 Major depressive disorder, single episode, unspecified: Secondary | ICD-10-CM | POA: Diagnosis present

## 2014-09-20 DIAGNOSIS — Z7982 Long term (current) use of aspirin: Secondary | ICD-10-CM

## 2014-09-20 DIAGNOSIS — I248 Other forms of acute ischemic heart disease: Secondary | ICD-10-CM | POA: Diagnosis present

## 2014-09-20 DIAGNOSIS — R0603 Acute respiratory distress: Secondary | ICD-10-CM

## 2014-09-20 DIAGNOSIS — Z794 Long term (current) use of insulin: Secondary | ICD-10-CM | POA: Diagnosis not present

## 2014-09-20 DIAGNOSIS — R778 Other specified abnormalities of plasma proteins: Secondary | ICD-10-CM

## 2014-09-20 DIAGNOSIS — I10 Essential (primary) hypertension: Secondary | ICD-10-CM

## 2014-09-20 DIAGNOSIS — J811 Chronic pulmonary edema: Secondary | ICD-10-CM

## 2014-09-20 HISTORY — DX: Alzheimer's disease, unspecified: G30.9

## 2014-09-20 HISTORY — DX: Malignant neoplasm of colon, unspecified: C18.9

## 2014-09-20 HISTORY — DX: Dementia in other diseases classified elsewhere, unspecified severity, without behavioral disturbance, psychotic disturbance, mood disturbance, and anxiety: F02.80

## 2014-09-20 LAB — CBC WITH DIFFERENTIAL/PLATELET
Basophils Absolute: 0 10*3/uL (ref 0.0–0.1)
Basophils Relative: 0 % (ref 0–1)
Eosinophils Absolute: 0.1 10*3/uL (ref 0.0–0.7)
Eosinophils Relative: 1 % (ref 0–5)
HEMATOCRIT: 31.2 % — AB (ref 39.0–52.0)
HEMOGLOBIN: 10.5 g/dL — AB (ref 13.0–17.0)
LYMPHS PCT: 7 % — AB (ref 12–46)
Lymphs Abs: 0.8 10*3/uL (ref 0.7–4.0)
MCH: 25.9 pg — ABNORMAL LOW (ref 26.0–34.0)
MCHC: 33.7 g/dL (ref 30.0–36.0)
MCV: 76.8 fL — AB (ref 78.0–100.0)
MONO ABS: 1.2 10*3/uL — AB (ref 0.1–1.0)
MONOS PCT: 9 % (ref 3–12)
NEUTROS ABS: 10.8 10*3/uL — AB (ref 1.7–7.7)
NEUTROS PCT: 84 % — AB (ref 43–77)
Platelets: 196 10*3/uL (ref 150–400)
RBC: 4.06 MIL/uL — ABNORMAL LOW (ref 4.22–5.81)
RDW: 15.9 % — AB (ref 11.5–15.5)
WBC: 12.9 10*3/uL — ABNORMAL HIGH (ref 4.0–10.5)

## 2014-09-20 LAB — COMPREHENSIVE METABOLIC PANEL
ALBUMIN: 2.7 g/dL — AB (ref 3.5–5.0)
ALK PHOS: 71 U/L (ref 38–126)
ALT: 43 U/L (ref 17–63)
ANION GAP: 11 (ref 5–15)
AST: 39 U/L (ref 15–41)
BUN: 62 mg/dL — ABNORMAL HIGH (ref 6–20)
CHLORIDE: 102 mmol/L (ref 101–111)
CO2: 17 mmol/L — AB (ref 22–32)
Calcium: 9.3 mg/dL (ref 8.9–10.3)
Creatinine, Ser: 3.74 mg/dL — ABNORMAL HIGH (ref 0.61–1.24)
GFR calc non Af Amer: 15 mL/min — ABNORMAL LOW (ref >60)
GFR, EST AFRICAN AMERICAN: 18 mL/min — AB (ref >60)
GLUCOSE: 163 mg/dL — AB (ref 65–99)
POTASSIUM: 5.8 mmol/L — AB (ref 3.5–5.1)
SODIUM: 130 mmol/L — AB (ref 135–145)
Total Bilirubin: 0.7 mg/dL (ref 0.3–1.2)
Total Protein: 7.7 g/dL (ref 6.5–8.1)

## 2014-09-20 LAB — URINALYSIS, ROUTINE W REFLEX MICROSCOPIC
BILIRUBIN URINE: NEGATIVE
GLUCOSE, UA: NEGATIVE mg/dL
HGB URINE DIPSTICK: NEGATIVE
Ketones, ur: NEGATIVE mg/dL
Nitrite: NEGATIVE
Protein, ur: 30 mg/dL — AB
SPECIFIC GRAVITY, URINE: 1.017 (ref 1.005–1.030)
UROBILINOGEN UA: 0.2 mg/dL (ref 0.0–1.0)
pH: 5 (ref 5.0–8.0)

## 2014-09-20 LAB — BASIC METABOLIC PANEL
Anion gap: 10 (ref 5–15)
BUN: 60 mg/dL — AB (ref 6–20)
CO2: 18 mmol/L — ABNORMAL LOW (ref 22–32)
CREATININE: 3.52 mg/dL — AB (ref 0.61–1.24)
Calcium: 8.7 mg/dL — ABNORMAL LOW (ref 8.9–10.3)
Chloride: 105 mmol/L (ref 101–111)
GFR, EST AFRICAN AMERICAN: 19 mL/min — AB (ref >60)
GFR, EST NON AFRICAN AMERICAN: 16 mL/min — AB (ref >60)
Glucose, Bld: 204 mg/dL — ABNORMAL HIGH (ref 65–99)
POTASSIUM: 4.6 mmol/L (ref 3.5–5.1)
SODIUM: 133 mmol/L — AB (ref 135–145)

## 2014-09-20 LAB — I-STAT ARTERIAL BLOOD GAS, ED
Acid-base deficit: 10 mmol/L — ABNORMAL HIGH (ref 0.0–2.0)
BICARBONATE: 16.4 meq/L — AB (ref 20.0–24.0)
O2 Saturation: 87 %
PO2 ART: 60 mmHg — AB (ref 80.0–100.0)
TCO2: 18 mmol/L (ref 0–100)
pCO2 arterial: 36.6 mmHg (ref 35.0–45.0)
pH, Arterial: 7.26 — ABNORMAL LOW (ref 7.350–7.450)

## 2014-09-20 LAB — GLUCOSE, CAPILLARY
GLUCOSE-CAPILLARY: 146 mg/dL — AB (ref 65–99)
GLUCOSE-CAPILLARY: 130 mg/dL — AB (ref 65–99)
Glucose-Capillary: 98 mg/dL (ref 65–99)

## 2014-09-20 LAB — I-STAT CG4 LACTIC ACID, ED
LACTIC ACID, VENOUS: 1.51 mmol/L (ref 0.5–2.0)
LACTIC ACID, VENOUS: 1.71 mmol/L (ref 0.5–2.0)

## 2014-09-20 LAB — STREP PNEUMONIAE URINARY ANTIGEN: STREP PNEUMO URINARY ANTIGEN: NEGATIVE

## 2014-09-20 LAB — LACTIC ACID, PLASMA
Lactic Acid, Venous: 1.5 mmol/L (ref 0.5–2.0)
Lactic Acid, Venous: 1.1 mmol/L (ref 0.5–2.0)

## 2014-09-20 LAB — PROCALCITONIN: PROCALCITONIN: 6.67 ng/mL

## 2014-09-20 LAB — URINE MICROSCOPIC-ADD ON

## 2014-09-20 LAB — TROPONIN I
TROPONIN I: 0.29 ng/mL — AB (ref <0.031)
Troponin I: 0.27 ng/mL — ABNORMAL HIGH (ref <0.031)
Troponin I: 0.29 ng/mL — ABNORMAL HIGH (ref <0.031)

## 2014-09-20 LAB — BRAIN NATRIURETIC PEPTIDE: B Natriuretic Peptide: 1077.8 pg/mL — ABNORMAL HIGH (ref 0.0–100.0)

## 2014-09-20 LAB — MRSA PCR SCREENING: MRSA BY PCR: POSITIVE — AB

## 2014-09-20 MED ORDER — PANTOPRAZOLE SODIUM 40 MG IV SOLR
40.0000 mg | INTRAVENOUS | Status: DC
  Administered 2014-09-20 – 2014-09-23 (×4): 40 mg via INTRAVENOUS
  Filled 2014-09-20 (×5): qty 40

## 2014-09-20 MED ORDER — CETYLPYRIDINIUM CHLORIDE 0.05 % MT LIQD
7.0000 mL | Freq: Two times a day (BID) | OROMUCOSAL | Status: DC
  Administered 2014-09-20 – 2014-09-22 (×6): 7 mL via OROMUCOSAL

## 2014-09-20 MED ORDER — VANCOMYCIN HCL IN DEXTROSE 1-5 GM/200ML-% IV SOLN
1000.0000 mg | Freq: Once | INTRAVENOUS | Status: DC
  Filled 2014-09-20: qty 200

## 2014-09-20 MED ORDER — VANCOMYCIN HCL 10 G IV SOLR
1500.0000 mg | INTRAVENOUS | Status: DC
  Administered 2014-09-22: 1500 mg via INTRAVENOUS
  Filled 2014-09-20 (×2): qty 1500

## 2014-09-20 MED ORDER — LEVOFLOXACIN IN D5W 750 MG/150ML IV SOLN
750.0000 mg | Freq: Once | INTRAVENOUS | Status: AC
  Administered 2014-09-20: 750 mg via INTRAVENOUS
  Filled 2014-09-20: qty 150

## 2014-09-20 MED ORDER — MUPIROCIN 2 % EX OINT
1.0000 "application " | TOPICAL_OINTMENT | Freq: Two times a day (BID) | CUTANEOUS | Status: DC
  Administered 2014-09-20 – 2014-09-23 (×7): 1 via NASAL
  Filled 2014-09-20 (×2): qty 22

## 2014-09-20 MED ORDER — HEPARIN SODIUM (PORCINE) 5000 UNIT/ML IJ SOLN: 5000.0000 [IU] | Freq: Three times a day (TID) | INTRAMUSCULAR | Status: DC

## 2014-09-20 MED ORDER — PANTOPRAZOLE SODIUM 40 MG IV SOLR: 40.0000 mg | INTRAVENOUS | Status: DC

## 2014-09-20 MED ORDER — SODIUM POLYSTYRENE SULFONATE 15 GM/60ML PO SUSP
30.0000 g | Freq: Once | ORAL | Status: AC
  Administered 2014-09-20: 30 g via ORAL
  Filled 2014-09-20: qty 120

## 2014-09-20 MED ORDER — CHLORHEXIDINE GLUCONATE 0.12 % MT SOLN
15.0000 mL | Freq: Two times a day (BID) | OROMUCOSAL | Status: DC
  Administered 2014-09-20 – 2014-09-22 (×6): 15 mL via OROMUCOSAL
  Filled 2014-09-20: qty 15

## 2014-09-20 MED ORDER — PERFLUTREN LIPID MICROSPHERE
1.0000 mL | INTRAVENOUS | Status: AC | PRN
  Administered 2014-09-20: 2 mL via INTRAVENOUS
  Filled 2014-09-20: qty 10

## 2014-09-20 MED ORDER — DEXTROSE 5 % IV SOLN: Freq: Once | INTRAVENOUS | Status: DC

## 2014-09-20 MED ORDER — HEPARIN SODIUM (PORCINE) 5000 UNIT/ML IJ SOLN
5000.0000 [IU] | Freq: Three times a day (TID) | INTRAMUSCULAR | Status: DC
  Administered 2014-09-20 – 2014-09-23 (×10): 5000 [IU] via SUBCUTANEOUS
  Filled 2014-09-20 (×11): qty 1

## 2014-09-20 MED ORDER — INSULIN ASPART 100 UNIT/ML ~~LOC~~ SOLN: 0.0000 [IU] | SUBCUTANEOUS | Status: DC

## 2014-09-20 MED ORDER — DEXTROSE 5 % IV SOLN
2.0000 g | INTRAVENOUS | Status: DC
  Administered 2014-09-20 – 2014-09-23 (×4): 2 g via INTRAVENOUS
  Filled 2014-09-20 (×4): qty 2

## 2014-09-20 MED ORDER — CHLORHEXIDINE GLUCONATE CLOTH 2 % EX PADS
6.0000 | MEDICATED_PAD | Freq: Every day | CUTANEOUS | Status: DC
  Administered 2014-09-21 – 2014-09-23 (×3): 6 via TOPICAL

## 2014-09-20 MED ORDER — FUROSEMIDE 10 MG/ML IJ SOLN
40.0000 mg | Freq: Two times a day (BID) | INTRAMUSCULAR | Status: AC
  Administered 2014-09-20 – 2014-09-21 (×3): 40 mg via INTRAVENOUS
  Filled 2014-09-20 (×3): qty 4

## 2014-09-20 MED ORDER — VANCOMYCIN HCL 10 G IV SOLR
2000.0000 mg | Freq: Once | INTRAVENOUS | Status: AC
  Administered 2014-09-20: 2000 mg via INTRAVENOUS
  Filled 2014-09-20: qty 2000

## 2014-09-20 MED ORDER — INSULIN ASPART 100 UNIT/ML ~~LOC~~ SOLN
0.0000 [IU] | SUBCUTANEOUS | Status: DC
  Administered 2014-09-20 – 2014-09-22 (×7): 1 [IU] via SUBCUTANEOUS
  Administered 2014-09-22: 2 [IU] via SUBCUTANEOUS
  Administered 2014-09-23 (×2): 1 [IU] via SUBCUTANEOUS
  Administered 2014-09-23: 2 [IU] via SUBCUTANEOUS
  Administered 2014-09-23: 1 [IU] via SUBCUTANEOUS

## 2014-09-20 MED ORDER — FUROSEMIDE 10 MG/ML IJ SOLN
40.0000 mg | Freq: Once | INTRAMUSCULAR | Status: AC
  Administered 2014-09-20: 40 mg via INTRAVENOUS
  Filled 2014-09-20: qty 4

## 2014-09-20 MED ORDER — DEXTROSE 50 % IV SOLN
1.0000 | Freq: Once | INTRAVENOUS | Status: AC
  Administered 2014-09-20: 50 mL via INTRAVENOUS
  Filled 2014-09-20: qty 50

## 2014-09-20 MED ORDER — SODIUM CHLORIDE 0.9 % IV BOLUS (SEPSIS)
1000.0000 mL | INTRAVENOUS | Status: AC
  Administered 2014-09-20: 1000 mL via INTRAVENOUS

## 2014-09-20 MED ORDER — INSULIN ASPART 100 UNIT/ML ~~LOC~~ SOLN
10.0000 [IU] | Freq: Once | SUBCUTANEOUS | Status: AC
  Administered 2014-09-20: 10 [IU] via INTRAVENOUS
  Filled 2014-09-20: qty 1

## 2014-09-20 MED ORDER — SODIUM CHLORIDE 0.9 % IV SOLN: 250.0000 mL | INTRAVENOUS | Status: DC | PRN

## 2014-09-20 MED ORDER — SODIUM CHLORIDE 0.9 % IV SOLN
INTRAVENOUS | Status: DC
  Filled 2014-09-20: qty 2.5

## 2014-09-20 MED ORDER — LEVOFLOXACIN IN D5W 500 MG/100ML IV SOLN
500.0000 mg | INTRAVENOUS | Status: DC
  Filled 2014-09-20: qty 100

## 2014-09-20 MED ORDER — PIPERACILLIN-TAZOBACTAM 3.375 G IVPB 30 MIN
3.3750 g | Freq: Three times a day (TID) | INTRAVENOUS | Status: DC
  Administered 2014-09-20: 3.375 g via INTRAVENOUS
  Filled 2014-09-20 (×3): qty 50

## 2014-09-20 NOTE — ED Provider Notes (Signed)
CSN: [id number]     Arrival date & time 09/20/14  2725 History   First MD Initiated Contact with Patient 09/20/14 [phone]     Chief Complaint  Patient presents with  . Shortness of Breath     (Consider location/radiation/quality/duration/timing/severity/associated sxs/prior Treatment) Patient is a 69 y.o. male presenting with shortness of breath. The history is provided by the patient. No language interpreter was used.  Shortness of Breath [name] is a 69 y.o male with a history of colon cancer, hyponatremia, hypokalemia, anemia, DM, HTN, delusional disorder, hyperlipidemia, CHF, CKD stage 3, stroke, dementia, who presents via EMS from [facility] for respiratory distress.  He was recently started on augmentin for pneumonia and today he woke up with his sats in the 70's on room air.  He was put on a non rebreather by EMS and his O2 went to the 80's.  On cpap his sats are in the low 90's.  He is unable to give me a history. Level 5 caveat secondary to dementia.   Past Medical History  Diagnosis Date  . Hyponatremia   . Hypokalemia   . Anemia   . Diabetes mellitus without complication   . Hypertension   . UTI (lower urinary tract infection)   . Delusional disorder   . HOH (hard of hearing)   . Hyperlipidemia   . CHF (congestive heart failure)   . Renal insufficiency   . Stroke   . Dementia   . Depression   . Dysphagia   . Falls frequently   . Colon cancer 2009    s/p Colectomy  . Alzheimer's dementia    Past Surgical History  Procedure Laterality Date  . Colon surgery    . Colectomy  09/06/2007  . Ileostomy    . Peg tube removal    . Cardiac catheterization    . Cholecystectomy    . Abdominal adhesion surgery     History reviewed. No pertinent family history. Social History  Substance Use Topics  . Smoking status: Never Smoker   . Smokeless tobacco: None  . Alcohol Use: No    Review of Systems  Unable to perform ROS: Dementia  Respiratory: Positive for shortness  of breath.       Allergies  Review of patient's allergies indicates no known allergies.  Home Medications   Prior to Admission medications   Medication Sig Start Date End Date Taking? Authorizing Provider  acetaminophen (TYLENOL) 500 MG tablet Take 500 mg by mouth every 6 (six) hours as needed for pain.   Yes Historical Provider, MD  Amino Acids-Protein Hydrolys (FEEDING SUPPLEMENT, PRO-STAT SUGAR FREE 64,) LIQD Place 30 mLs into feeding tube 2 (two) times daily.   Yes Historical Provider, MD  amLODipine (NORVASC) 10 MG tablet Give 10 mg by tube daily.   Yes Historical Provider, MD  amoxicillin-clavulanate (AUGMENTIN) 875-125 MG per tablet Give 1 tablet by tube 2 (two) times daily.   Yes Historical Provider, MD  aspirin 81 MG chewable tablet Give 81 mg by tube daily.   Yes Historical Provider, MD  atorvastatin (LIPITOR) 20 MG tablet Give 20 mg by tube daily.   Yes Historical Provider, MD  cyanocobalamin (,VITAMIN B-12,) 1000 MCG/ML injection Inject 1,000 mcg into the muscle every 30 (thirty) days.   Yes Historical Provider, MD  donepezil (ARICEPT) 10 MG tablet Give 10 mg by tube at bedtime.    Yes Historical Provider, MD  ferrous sulfate 220 (44 FE) MG/5ML solution Place 330 mg into feeding  tube daily.   Yes Historical Provider, MD  furosemide (LASIX) 20 MG tablet Give 20 mg by tube daily.    Yes Historical Provider, MD  hydrALAZINE (APRESOLINE) 10 MG tablet Take 15 mg by mouth 4 (four) times daily.   Yes Historical Provider, MD  insulin detemir (LEVEMIR) 100 UNIT/ML injection Inject 5 Units into the skin at bedtime.   Yes Historical Provider, MD  insulin lispro (HUMALOG) 100 UNIT/ML injection Inject 5 Units into the skin 3 (three) times daily as needed for high blood sugar.   Yes Historical Provider, MD  ipratropium-albuterol (DUONEB) 0.5-2.5 (3) MG/3ML SOLN Take 3 mLs by nebulization every 6 (six) hours as needed (for breathing).   Yes Historical Provider, MD  Multiple Vitamins-Minerals  (CERTAGEN PO) Give 1 tablet by tube daily.   Yes Historical Provider, MD  potassium chloride 20 MEQ/15ML (10%) SOLN Take 20 mEq by mouth daily.   Yes Historical Provider, MD  QUEtiapine (SEROQUEL) 25 MG tablet Give 25 mg by tube 2 (two) times daily.    Yes Historical Provider, MD  sennosides (SENOKOT) 8.8 MG/5ML syrup Place 15 mLs into feeding tube daily.   Yes Historical Provider, MD  sertraline (ZOLOFT) 100 MG tablet Take 150 mg by mouth daily.   Yes Historical Provider, MD  traZODone (DESYREL) 50 MG tablet Give 25 mg by tube 2 (two) times daily.    Yes Historical Provider, MD  vitamin C (ASCORBIC ACID) 500 MG tablet Give 1,000 mg by tube 2 (two) times daily.    Yes Historical Provider, MD  Nutritional Supplements (FEEDING SUPPLEMENT, OSMOLITE 1.2 CAL,) LIQD Place 1,000 mLs into feeding tube continuous. Initiate Osmolite 1.2 @ 20 ml/hr via PEG and increase by 10 ml every 4 hours to goal rate of 70 ml/hr. At goal rate, tube feeding regimen will provide 2016 kcal, 93 grams of protein, and 1378 ml of H2O. Provide 120 ml free water flushes q 4 hours. Patient not taking: Reported on 09/20/2014 07/14/12   Barton Dubois, MD  potassium chloride (KCL) 2 mEq/mL SOLN oral liquid Take 10 mLs (20 mEq total) by mouth daily. Per tube Patient not taking: Reported on 09/20/2014 07/14/12   Barton Dubois, MD  Water For Irrigation, Sterile (FREE WATER) SOLN Place 120 mLs into feeding tube every 4 (four) hours. Patient not taking: Reported on 09/20/2014 07/14/12   Barton Dubois, MD   BP 137/67 mmHg  Pulse 72  Temp(Src) 97.8 F (36.6 C) (Oral)  Resp 37  Ht 5\' 10"  (1.778 m)  Wt 240 lb 1.3 oz (108.9 kg)  BMI 34.45 kg/m2  SpO2 96% Physical Exam  Constitutional: He is oriented to person, place, and time. He appears well-developed and well-nourished.  Obese  HENT:  Head: Normocephalic and atraumatic.  Eyes: Conjunctivae are normal.  Neck: Normal range of motion. Neck supple.  Cardiovascular: Normal rate, regular rhythm and  normal heart sounds.   Pulmonary/Chest: Accessory muscle usage present. He is in respiratory distress. He has wheezes. He has rhonchi.  Respiratory distress. On Cpap with sat of 94%.  Abdominal: Soft. There is no tenderness.  Colostomy bag in place with brown stool.   Musculoskeletal:  Left foot: wrapped in dressing which was removed  He has bilateral peripheral edema and venous insufficiency.   Neurological: He is alert and oriented to person, place, and time.  Skin: Skin is warm and dry. He is not diaphoretic.  No skin tenting.   Nursing note and vitals reviewed.   ED Course  Procedures (including  critical care time) Labs Review Labs Reviewed  MRSA PCR SCREENING - Abnormal; Notable for the following:    MRSA by PCR POSITIVE (*)    All other components within normal limits  COMPREHENSIVE METABOLIC PANEL - Abnormal; Notable for the following:    Sodium 130 (*)    Potassium 5.8 (*)    CO2 17 (*)    Glucose, Bld 163 (*)    BUN 62 (*)    Creatinine, Ser 3.74 (*)    Albumin 2.7 (*)    GFR calc non Af Amer 15 (*)    GFR calc Af Amer 18 (*)    All other components within normal limits  CBC WITH DIFFERENTIAL/PLATELET - Abnormal; Notable for the following:    WBC 12.9 (*)    RBC 4.06 (*)    Hemoglobin 10.5 (*)    HCT 31.2 (*)    MCV 76.8 (*)    MCH 25.9 (*)    RDW 15.9 (*)    Neutrophils Relative % 84 (*)    Neutro Abs 10.8 (*)    Lymphocytes Relative 7 (*)    Monocytes Absolute 1.2 (*)    All other components within normal limits  URINALYSIS, ROUTINE W REFLEX MICROSCOPIC (NOT AT [facility]) - Abnormal; Notable for the following:    APPearance CLOUDY (*)    Protein, ur 30 (*)    Leukocytes, UA MODERATE (*)    All other components within normal limits  TROPONIN I - Abnormal; Notable for the following:    Troponin I 0.29 (*)    All other components within normal limits  BRAIN NATRIURETIC PEPTIDE - Abnormal; Notable for the following:    B Natriuretic Peptide 1077.8 (*)    All  other components within normal limits  URINE MICROSCOPIC-ADD ON - Abnormal; Notable for the following:    Bacteria, UA FEW (*)    Casts HYALINE CASTS (*)    All other components within normal limits  BASIC METABOLIC PANEL - Abnormal; Notable for the following:    Sodium 133 (*)    CO2 18 (*)    Glucose, Bld 204 (*)    BUN 60 (*)    Creatinine, Ser 3.52 (*)    Calcium 8.7 (*)    GFR calc non Af Amer 16 (*)    GFR calc Af Amer 19 (*)    All other components within normal limits  TROPONIN I - Abnormal; Notable for the following:    Troponin I 0.27 (*)    All other components within normal limits  TROPONIN I - Abnormal; Notable for the following:    Troponin I 0.29 (*)    All other components within normal limits  HEMOGLOBIN A1C - Abnormal; Notable for the following:    Hgb A1c MFr Bld 6.8 (*)    All other components within normal limits  GLUCOSE, CAPILLARY - Abnormal; Notable for the following:    Glucose-Capillary 146 (*)    All other components within normal limits  TROPONIN I - Abnormal; Notable for the following:    Troponin I 0.30 (*)    All other components within normal limits  GLUCOSE, CAPILLARY - Abnormal; Notable for the following:    Glucose-Capillary 130 (*)    All other components within normal limits  CBC WITH DIFFERENTIAL/PLATELET - Abnormal; Notable for the following:    WBC 10.6 (*)    RBC 3.69 (*)    Hemoglobin 9.7 (*)    HCT 28.6 (*)    MCV  77.5 (*)    RDW 15.8 (*)    Neutrophils Relative % 84 (*)    Neutro Abs 8.9 (*)    Lymphocytes Relative 4 (*)    Lymphs Abs 0.4 (*)    Monocytes Absolute 1.1 (*)    All other components within normal limits  RENAL FUNCTION PANEL - Abnormal; Notable for the following:    CO2 16 (*)    BUN 59 (*)    Creatinine, Ser 3.42 (*)    Calcium 8.4 (*)    Phosphorus 5.9 (*)    Albumin 2.4 (*)    GFR calc non Af Amer 17 (*)    GFR calc Af Amer 20 (*)    All other components within normal limits  BASIC METABOLIC PANEL -  Abnormal; Notable for the following:    CO2 20 (*)    Glucose, Bld 109 (*)    BUN 62 (*)    Creatinine, Ser 3.38 (*)    Calcium 8.6 (*)    GFR calc non Af Amer 17 (*)    GFR calc Af Amer 20 (*)    All other components within normal limits  GLUCOSE, CAPILLARY - Abnormal; Notable for the following:    Glucose-Capillary 106 (*)    All other components within normal limits  GLUCOSE, CAPILLARY - Abnormal; Notable for the following:    Glucose-Capillary 101 (*)    All other components within normal limits  I-STAT ARTERIAL BLOOD GAS, ED - Abnormal; Notable for the following:    pH, Arterial 7.260 (*)    pO2, Arterial 60.0 (*)    Bicarbonate 16.4 (*)    Acid-base deficit 10.0 (*)    All other components within normal limits  CULTURE, BLOOD (ROUTINE X 2)  CULTURE, BLOOD (ROUTINE X 2)  URINE CULTURE  RESPIRATORY VIRUS PANEL  PROCALCITONIN  STREP PNEUMONIAE URINARY ANTIGEN  LACTIC ACID, PLASMA  LACTIC ACID, PLASMA  MAGNESIUM  PROCALCITONIN  GLUCOSE, CAPILLARY  GLUCOSE, CAPILLARY  MAGNESIUM  LEGIONELLA ANTIGEN, URINE  I-STAT CG4 LACTIC ACID, ED  I-STAT CG4 LACTIC ACID, ED    Imaging Review US Renal  09/20/2014   CLINICAL DATA:  Acute on chronic renal failure.  EXAM: RENAL / URINARY TRACT ULTRASOUND COMPLETE  COMPARISON:  06/14/2012  FINDINGS: Right Kidney:  Length: 11.3 cm. Diffuse parenchymal atrophy with diffusely increased parenchymal echotexture suggesting medical renal disease. Mild scarring. Focal prominence in the upper mid pole probably representing prominent column of Bertin rather than cyst. No hydronephrosis.  Left Kidney:  Length: 11.2 cm. Diffuse parenchymal atrophy with increased parenchymal echotexture suggesting chronic medical renal disease. Mild scarring. No hydronephrosis.  Bladder:  Limited visualization of the bladder. Poor penetration. No discrete filling defect or wall thickening appreciated as visualized.  IMPRESSION: Diffuse parenchymal atrophy and increased  parenchymal echotexture in both kidneys consistent with chronic medical renal disease. No hydronephrosis.   Electronically Signed   By: Lucienne Capers M.D.   On: 09/20/2014 23:05   Dg Chest Portable 1 View  09/20/2014   CLINICAL DATA:  Short of breath  EXAM: PORTABLE CHEST - 1 VIEW  COMPARISON:  07/13/2012  FINDINGS: Cardiac enlargement. Diffuse bilateral airspace disease is symmetric and most consistent with pulmonary edema. No significant pleural effusion. Mild atelectasis in the bases.  IMPRESSION: Pulmonary edema   Electronically Signed   By: Franchot Gallo M.D.   On: 09/20/2014 07:50   I have personally reviewed and evaluated these images and lab results as part of my medical decision-making.  EKG Interpretation   Date/Time:  Friday September 20 2014 06:50:30 EDT Ventricular Rate:  57 PR Interval:    QRS Duration: 109 QT Interval:  437 QTC Calculation: 425 R Axis:   -67 Text Interpretation:  Junctional rhythm Left axis deviation Abnormal T,  probable ischemia, widespread Abnormal T waves inferiorly and laterally  most promonently Confirmed by HORTON  MD, Loma Sousa ([id number]) on 09/20/2014  7:33:25 AM     CRITICAL CARE Performed by: Ottie Glazier  Total critical care time: 35 Critical care time was exclusive of separately billable procedures and treating other patients. Critical care was necessary to treat or prevent imminent or life-threatening deterioration.  Critical care was time spent personally by me on the following activities: development of treatment plan with patient and/or surrogate as well as nursing, discussions with consultants, evaluation of patient's response to treatment, examination of patient, obtaining history from patient or surrogate, ordering and performing treatments and interventions, ordering and review of laboratory studies, ordering and review of radiographic studies, pulse oximetry and re-evaluation of patient's condition.  MDM   Final diagnoses:   Hyperkalemia  Acute congestive heart failure, unspecified congestive heart failure type  Elevated troponin  Acute renal failure superimposed on stage 3 chronic kidney disease  Respiratory distress  Patient came in from nursing facility for respiratory distress.  Previously treated with Augmentin for possible pneumonia a few days ago. He was found at 70% o2 this morning by staff.  En route with EMS he was put on bipap and was saturating in the low 80's with a RR in the mid 30.  Upon arrival to the ED he was put on CPAP and was 93% with a RR in the 20's.  Sepsis protocol was ordered and he was started on vancomycin and zosyn.  He is hyperkalemic and in CHF with BNP over 1000. He was started on insulin, D50, and kayexalate.  He also has a troponin of 0.29 which may be demand ischemia. The EKG is significantly changed from previous EKG. One bolus of fluids was originally started due to the patient's kidney function but was stopped after chest x-ray resulted which showed pulmonary edema. His lactic acid level is 1.71. ABG with pH of 7.2 and PO2 of 60.  Although he is in respiratory distress, he is stable at this time. Intubation is not necessary. 7:50 I spoke to critical care regarding need for an admission. They recommended additional bacterial coverage with Levaquin.  8:29 I consulted cardiology due to multiple comorbidities including CHF, elevated troponin, and EKG changes.    Ottie Glazier, PA-C 09/21/14 0881  Merryl Hacker, MD 09/21/14 [phone]

## 2014-09-20 NOTE — Progress Notes (Addendum)
ANTIBIOTIC CONSULT NOTE - INITIAL  Pharmacy Consult for levaquin Indication: rule out pneumonia  No Known Allergies  Patient Measurements: Height: 6\' 1"  (185.4 cm) Weight: 245 lb (111.131 kg) IBW/kg (Calculated) : 79.9 Adjusted Body Weight:   Vital Signs: Temp: 99.3 F (37.4 C) (09/09 0658) Temp Source: Rectal (09/09 0658) BP: 143/72 mmHg (09/09 0730) Pulse Rate: 54 (09/09 0744) Intake/Output from previous day:   Intake/Output from this shift: Total I/O In: -  Out: 250 [Urine:250]  Labs:  Recent Labs  09/20/14 0655  WBC 12.9*  HGB 10.5*  PLT 196  CREATININE 3.74*   Estimated Creatinine Clearance: 24.4 mL/min (by C-G formula based on Cr of 3.74). No results for input(s): VANCOTROUGH, VANCOPEAK, VANCORANDOM, GENTTROUGH, GENTPEAK, GENTRANDOM, TOBRATROUGH, TOBRAPEAK, TOBRARND, AMIKACINPEAK, AMIKACINTROU, AMIKACIN in the last 72 hours.   Microbiology: Recent Results (from the past 720 hour(s))  Blood Culture (routine x 2)     Status: None (Preliminary result)   Collection Time: 09/20/14  6:46 AM  Result Value Ref Range Status   Specimen Description BLOOD LEFT ARM  Final   Special Requests BOTTLES DRAWN AEROBIC AND ANAEROBIC 10CC  Final   Culture PENDING  Incomplete   Report Status PENDING  Incomplete  Blood Culture (routine x 2)     Status: None (Preliminary result)   Collection Time: 09/20/14  6:55 AM  Result Value Ref Range Status   Specimen Description BLOOD LEFT HAND  Final   Special Requests BOTTLES DRAWN AEROBIC AND ANAEROBIC 10CC  Final   Culture PENDING  Incomplete   Report Status PENDING  Incomplete    Medical History: Past Medical History  Diagnosis Date  . Hyponatremia   . Hypokalemia   . Anemia   . Diabetes mellitus without complication   . Hypertension   . UTI (lower urinary tract infection)   . Delusional disorder   . HOH (hard of hearing)   . Hyperlipidemia   . CHF (congestive heart failure)   . Renal insufficiency   . Stroke   .  Dementia   . Depression   . Dysphagia   . Falls frequently     Medications:  Anti-infectives    Start     Dose/Rate Route Frequency Ordered Stop   09/22/14 1000  levofloxacin (LEVAQUIN) IVPB 500 mg     500 mg 100 mL/hr over 60 Minutes Intravenous Every 48 hours 09/20/14 0837     09/20/14 0815  levofloxacin (LEVAQUIN) IVPB 750 mg     750 mg 100 mL/hr over 90 Minutes Intravenous  Once 09/20/14 0803     09/20/14 0700  vancomycin (VANCOCIN) IVPB 1000 mg/200 mL premix  Status:  Discontinued     1,000 mg 200 mL/hr over 60 Minutes Intravenous  Once 09/20/14 0646 09/20/14 0651   09/20/14 0700  piperacillin-tazobactam (ZOSYN) IVPB 3.375 g     3.375 g 100 mL/hr over 30 Minutes Intravenous 3 times per day 09/20/14 0646     09/20/14 0700  vancomycin (VANCOCIN) 2,000 mg in sodium chloride 0.9 % 500 mL IVPB     2,000 mg 250 mL/hr over 120 Minutes Intravenous  Once 09/20/14 0651       Assessment: 5 yom presented to the ED with respiratory distress. To start levaquin for possible pneumonia. He also received 1x doses of vancomycin + zosyn. Pt is afebrile and WBC is 12.9. Scr is elevated at 3.74. LA is 1.51.  Levaquin 9/9>> Zosyn x 1 9/9 Vanc x 1 9/9  Goal of Therapy:  Eradication  of infection  Plan:  - Levaquin 750mg  IV x 1 then 500mg  IV Q48H - F/u renal fxn, C&S, clinical status  Maeby Vankleeck, Rande Lawman 09/20/2014,8:37 AM  Addendum: Continue vancomycin and changing zosyn to ceftazidime.  Plan: - Vanc 1500mg  IV Q48H - Ceftazidime 2gm IV Q24H - F/u renal fxn, C&S, clinical status and trough at [facility], PharmD, BCPS Pager # [phone] 09/20/2014 9:41 AM

## 2014-09-20 NOTE — H&P (Signed)
PULMONARY / CRITICAL CARE MEDICINE   Name: [name] MRN: [id number] DOB: 1945/06/23    ADMISSION DATE:  09/20/2014  REFERRING MD :  EDP  CHIEF COMPLAINT:  SOB  INITIAL PRESENTATION: Hypoxia resp failure  STUDIES:  9/9 renal US  SIGNIFICANT EVENTS:    HISTORY OF PRESENT ILLNESS:   69 yo AAM, NH pt x 6 years secondary to strokes, advanced dementia, DM, CRI Base 1.44,currently 3.74), HTN, frequent uti's with chronic incontinence, who started on Augmentin 9/8 for presumed pna at SNF. He was found this am with O2 saturations of 70 % and transferred to [facility] ED. He is a full code, palliative care note from 2014 following bowel resection, colostomy and Peg PLACEMENT. Family at that time opted for full code. Currently on NIMVS with ABG 7.26/36/60 and was intially treated with IVF for presumed sepsis but was fluid overloaded and IVF was stopped and lasix was ordered. PCCM will admit to ICU and confer with family about code status. Continue abx and pan culture. Note he has worsening renal failure with K+ 5.8 with insulin/d50/kayexalate  PAST MEDICAL HISTORY :   has a past medical history of Hyponatremia; Hypokalemia; Anemia; Diabetes mellitus without complication; Hypertension; UTI (lower urinary tract infection); Delusional disorder; HOH (hard of hearing); Hyperlipidemia; CHF (congestive heart failure); Renal insufficiency; Stroke; Dementia; Depression; Dysphagia; and Falls frequently.  has past surgical history that includes Colon surgery; Colectomy; Ileostomy; PEG tube removal; Cardiac catheterization; Cholecystectomy; and Abdominal adhesion surgery. Prior to Admission medications   Medication Sig Start Date End Date Taking? Authorizing Provider  acetaminophen (TYLENOL) 500 MG tablet Take 500 mg by mouth every 6 (six) hours as needed for pain.   Yes Historical Provider, MD  Amino Acids-Protein Hydrolys (FEEDING SUPPLEMENT, PRO-STAT SUGAR FREE 64,) LIQD Place 30 mLs into feeding tube 2  (two) times daily.   Yes Historical Provider, MD  amLODipine (NORVASC) 10 MG tablet Give 10 mg by tube daily.   Yes Historical Provider, MD  amoxicillin-clavulanate (AUGMENTIN) 875-125 MG per tablet Give 1 tablet by tube 2 (two) times daily.   Yes Historical Provider, MD  aspirin 81 MG chewable tablet Give 81 mg by tube daily.   Yes Historical Provider, MD  atorvastatin (LIPITOR) 20 MG tablet Give 20 mg by tube daily.   Yes Historical Provider, MD  cyanocobalamin (,VITAMIN B-12,) 1000 MCG/ML injection Inject 1,000 mcg into the muscle every 30 (thirty) days.   Yes Historical Provider, MD  donepezil (ARICEPT) 10 MG tablet Give 10 mg by tube at bedtime.    Yes Historical Provider, MD  ferrous sulfate 220 (44 FE) MG/5ML solution Place 330 mg into feeding tube daily.   Yes Historical Provider, MD  furosemide (LASIX) 20 MG tablet Give 20 mg by tube daily.    Yes Historical Provider, MD  hydrALAZINE (APRESOLINE) 10 MG tablet Take 15 mg by mouth 4 (four) times daily.   Yes Historical Provider, MD  insulin detemir (LEVEMIR) 100 UNIT/ML injection Inject 5 Units into the skin at bedtime.   Yes Historical Provider, MD  insulin lispro (HUMALOG) 100 UNIT/ML injection Inject 5 Units into the skin 3 (three) times daily as needed for high blood sugar.   Yes Historical Provider, MD  ipratropium-albuterol (DUONEB) 0.5-2.5 (3) MG/3ML SOLN Take 3 mLs by nebulization every 6 (six) hours as needed (for breathing).   Yes Historical Provider, MD  Multiple Vitamins-Minerals (CERTAGEN PO) Give 1 tablet by tube daily.   Yes Historical Provider, MD  potassium chloride 20 MEQ/15ML (  10%) SOLN Take 20 mEq by mouth daily.   Yes Historical Provider, MD  QUEtiapine (SEROQUEL) 25 MG tablet Give 25 mg by tube 2 (two) times daily.    Yes Historical Provider, MD  sennosides (SENOKOT) 8.8 MG/5ML syrup Place 15 mLs into feeding tube daily.   Yes Historical Provider, MD  sertraline (ZOLOFT) 100 MG tablet Take 150 mg by mouth daily.   Yes  Historical Provider, MD  traZODone (DESYREL) 50 MG tablet Give 25 mg by tube 2 (two) times daily.    Yes Historical Provider, MD  vitamin C (ASCORBIC ACID) 500 MG tablet Give 1,000 mg by tube 2 (two) times daily.    Yes Historical Provider, MD  Nutritional Supplements (FEEDING SUPPLEMENT, OSMOLITE 1.2 CAL,) LIQD Place 1,000 mLs into feeding tube continuous. Initiate Osmolite 1.2 @ 20 ml/hr via PEG and increase by 10 ml every 4 hours to goal rate of 70 ml/hr. At goal rate, tube feeding regimen will provide 2016 kcal, 93 grams of protein, and 1378 ml of H2O. Provide 120 ml free water flushes q 4 hours. Patient not taking: Reported on 09/20/2014 07/14/12   Barton Dubois, MD  potassium chloride (KCL) 2 mEq/mL SOLN oral liquid Take 10 mLs (20 mEq total) by mouth daily. Per tube Patient not taking: Reported on 09/20/2014 07/14/12   Barton Dubois, MD  Water For Irrigation, Sterile (FREE WATER) SOLN Place 120 mLs into feeding tube every 4 (four) hours. Patient not taking: Reported on 09/20/2014 07/14/12   Barton Dubois, MD   No Known Allergies  FAMILY HISTORY:  has no family status information on file.  SOCIAL HISTORY:  reports that he has never smoked. He does not have any smokeless tobacco history on file. He reports that he does not drink alcohol or use illicit drugs.  REVIEW OF SYSTEMS:  NA  SUBJECTIVE:   VITAL SIGNS: Temp:  [99.3 F (37.4 C)] 99.3 F (37.4 C) (09/09 0658) Pulse Rate:  [54-59] 54 (09/09 0744) Resp:  [21-38] 21 (09/09 0744) BP: (143-153)/(72-74) 143/72 mmHg (09/09 0730) SpO2:  [94 %-95 %] 95 % (09/09 0744) Weight:  [245 lb (111.131 kg)] 245 lb (111.131 kg) (09/09 0644) HEMODYNAMICS:   VENTILATOR SETTINGS:   INTAKE / OUTPUT:  Intake/Output Summary (Last 24 hours) at 09/20/14 0832 Last data filed at 09/20/14 0744  Gross per 24 hour  Intake      0 ml  Output    250 ml  Net   -250 ml    PHYSICAL EXAMINATION: General:  EAAMNAD on NIMVS Neuro: Follows commands. Not orientated   HEENT:  Poor detention  Cardiovascular:  HSR ssb Lungs:  Decreased bs, basilar crackles, rr 33 Abdomen:  Colostomy/peg in place Musculoskeletal: intact Skin:  Lower ext venous stasis and +++ edema  LABS:  CBC  Recent Labs Lab 09/20/14 0655  WBC 12.9*  HGB 10.5*  HCT 31.2*  PLT 196   Coag's No results for input(s): APTT, INR in the last 168 hours. BMET  Recent Labs Lab 09/20/14 0655  NA 130*  K 5.8*  CL 102  CO2 17*  BUN 62*  CREATININE 3.74*  GLUCOSE 163*   Electrolytes  Recent Labs Lab 09/20/14 0655  CALCIUM 9.3   Sepsis Markers  Recent Labs Lab 09/20/14 0659  LATICACIDVEN 1.51   ABG  Recent Labs Lab 09/20/14 0652  PHART 7.260*  PCO2ART 36.6  PO2ART 60.0*   Liver Enzymes  Recent Labs Lab 09/20/14 0655  AST 39  ALT 43  ALKPHOS 71  BILITOT 0.7  ALBUMIN 2.7*   Cardiac Enzymes  Recent Labs Lab 09/20/14 0655  TROPONINI 0.29*   Glucose No results for input(s): GLUCAP in the last 168 hours.  Imaging Dg Chest Portable 1 View  09/20/2014   CLINICAL DATA:  Short of breath  EXAM: PORTABLE CHEST - 1 VIEW  COMPARISON:  07/13/2012  FINDINGS: Cardiac enlargement. Diffuse bilateral airspace disease is symmetric and most consistent with pulmonary edema. No significant pleural effusion. Mild atelectasis in the bases.  IMPRESSION: Pulmonary edema   Electronically Signed   By: Franchot Gallo M.D.   On: 09/20/2014 07:50     ASSESSMENT / PLAN:  PULMONARY OETT A: Hypoxic resp failure ? pna P:   NIMVS as tolerated May need intubation  CARDIOVASCULAR CVL A:  HTN  Presumed volume overload P:  Lasix x 1 with close following creatine Check trop, bnp, 12 lead  RENAL Lab Results  Component Value Date   CREATININE 3.74* 09/20/2014   CREATININE 1.44* 07/14/2012   CREATININE 1.46* 07/13/2012    A:   Acute on chronic renal failure P:   Avoid nephrotoxins Place foley Renal US for completeness   GASTROINTESTINAL A:   Colostomy G  tube P:   NPO use g tube PPI  HEMATOLOGIC A:  No acute issue P:    INFECTIOUS A: \ Suspected Pna P:   BCx2 9/9>> UC 9/9>> Sputum 9/9>> Abx:  9/9 vanc>> 9/9 Fortaz>> 9/9 Levaquin>>  ENDOCRINE A:   DM   P:   SSI  NEUROLOGIC A:   Advanced dementia Hx of strokes P:   RASS goal:1 Avoid sedation   FAMILY  - Updates: No family at bedside  - Inter-disciplinary family meet or Palliative Care meeting due by:  day 7    TODAY'S SUMMARY: 69 yo AAM, NH pt x 6 years secondary to strokes, advanced dementia, DM, CRI Base 1.44,currently 3.74), HTN, frequent uti's with chronic incontinence, who started on Augmentin 9/8 for presumed pna at SNF. He was found this am with O2 saturations of 70 % and transferred to [facility] ED. He is a full code, palliative care note from 2014 following bowel resection, colostomy and Peg PLACEMENT. Family at that time opted for full code. Currently on NIMVS with ABG 7.26/36/60 and was intially treated with IVF for presumed sepsis but was fluid overloaded and IVF was stopped and lasix was ordered. PCCM will admit to ICU and confer with family about code status. Continue abx and pan culture. Note he has worsening renal failure with K+ 5.8 with insulin/d50/kayexalate.   Richardson Landry Nga Rabon ACNP Maryanna Shape PCCM Pager [phone] till 3 pm If no answer page [phone] 09/20/2014, 9:03 AM

## 2014-09-20 NOTE — ED Notes (Signed)
Patient made aware of need of urine specimen; patient handed an urinal to attempt to provide an urine sample

## 2014-09-20 NOTE — Consult Note (Signed)
CARDIOLOGY CONSULT NOTE   Patient ID: [name] MRN: [id number], DOB/AGE: 1945-06-18   Admit date: 09/20/2014 Date of Consult: 09/20/2014 Reason for Consult: Congestive Heart Failure   Primary Physician: Cyndee Brightly, MD Primary Cardiologist: New - (Last seen by Dr. Terrence Dupont in 2009)  HPI: [name] is a 69 y.o. male with PMH of Alzheimer's Disease, Type II Diabetes Mellitus, HTN, HLD, Colon Cancer (s/p colectomy 2009, has PEG tube and colostomy), Strokes, CKD Stage 3 who presented to [facility] ED for respiratory distress with O2 saturation at 93% on CPAP upon arrival to the ED. According to EMS report, he initially had an O2 saturation in thr 70's this morning and was placed on a NRB which improved his stats to the 9's.  He was recently diagnosed with pneumonia (on Augmentin since 09/19/2014).    In the ED, BNP was elevated to 1077.8 and troponin was elevated at 0.29. Was receiving one bolus of fluids while in the ED but this was immediately stopped after CXR came back and showed pulmonary edema. CXR also showed cardiac enlargement and mild atelectasis in the bases. EKG also obtained which showed junctional rhythm with rate in 50's, left axis deviation, and inverted T-waves in inferior and lateral leads.  The patient is responsive to questions but is unable to give clear answers at this time and is also difficult to understand while on CPAP for respiratory support. He does nod yes to having trouble breathing. Denies any current chest pain.  Records show his last cardiac catheterization was in 06/2007 EF of 55-60%, LAD with 15-20% proximal and mid stenosis, left circumflex with 5-10% proximal stenosis, RCA with 20-25% mid stenosis.  His last Echocardiogram was in 07/2007 which showed normal left ventricular systolic function was normal, with EF of 55 to 60%. There were no left ventricular regional wall motion abnormalities and Left ventricular wall thickness was at the upper  limits of normal. Aortic valve thickness was mildly increased.There was a continuous turbulent flow noted in the lateral right atrium, which may represent a fistulous tract into the atrium. It appears no further cardiac work-up has been conducted since.  In reviewing medical records from his facility, he has been on ASA 81mg , Lasix 20mg  daily, Potassium supplementation of 63mEq daily, Lipitor 20mg  daily, Amlodipine 10mg  daily, Hydralazine 10mg  QID.  Currently resides at [facility] and has for the past 6 years. Unable to obtain additional updated personal medical history, family history, or social history due to being on CPAP for current respiratory distress and having Alzheimer's Dementia.   Problem List Past Medical History  Diagnosis Date  . Hyponatremia   . Hypokalemia   . Anemia   . Diabetes mellitus without complication   . Hypertension   . UTI (lower urinary tract infection)   . Delusional disorder   . HOH (hard of hearing)   . Hyperlipidemia   . CHF (congestive heart failure)   . Renal insufficiency   . Stroke   . Dementia   . Depression   . Dysphagia   . Falls frequently   . Colon cancer 2009    s/p Colectomy  . Alzheimer's dementia     Past Surgical History  Procedure Laterality Date  . Colon surgery    . Colectomy  09/06/2007  . Ileostomy    . Peg tube removal    . Cardiac catheterization    . Cholecystectomy    . Abdominal adhesion surgery  Allergies No Known Allergies    Inpatient Medications  . antiseptic oral rinse  7 mL Mouth Rinse q12n4p  . cefTAZidime (FORTAZ)  IV  2 g Intravenous Q24H  . chlorhexidine  15 mL Mouth Rinse BID  . heparin  5,000 Units Subcutaneous 3 times per day  . insulin aspart  0-9 Units Subcutaneous 6 times per day  . [START ON 09/22/2014] levofloxacin (LEVAQUIN) IV  500 mg Intravenous Q48H  . levofloxacin (LEVAQUIN) IV  750 mg Intravenous Once  . pantoprazole (PROTONIX) IV  40 mg Intravenous  Q24H  . [START ON 09/22/2014] vancomycin  1,500 mg Intravenous Q48H    Family History History reviewed. No pertinent family history.   Social History Social History   Social History  . Marital Status: Divorced    Spouse Name: N/A  . Number of Children: N/A  . Years of Education: N/A   Occupational History  . Not on file.   Social History Main Topics  . Smoking status: Never Smoker   . Smokeless tobacco: Not on file  . Alcohol Use: No  . Drug Use: No  . Sexual Activity: Not Currently   Other Topics Concern  . Not on file   Social History Narrative     Review of Systems: Unable to be obtained due to patient's current medical status.  Physical Exam  Blood pressure 153/128, pulse 72, temperature 98.5 F (36.9 C), temperature source Oral, resp. rate 30, height 5\' 10"  (1.778 m), weight 251 lb 12.3 oz (114.2 kg), SpO2 98 %.  General: Pleasant, African American male on CPAP. Psych: Able to follow commands.  Neuro: Alert and oriented X 3. Moves all extremities spontaneously. HEENT: Normal. Pupils equal and reactive to light.  Neck: Supple without bruits. JVD slightly elevated. Lungs:  Resp regular and unlabored, on CPAP, Crackles at bases bilaterally and wheezing noted on auscultation. Heart: RRR no s3, s4, or murmurs. Abdomen: Soft, non-tender, colostomy in place.  Extremities: No clubbing, cyanosis or edema. DP/PT/Radials 2+ and equal bilaterally. 1+ edema bilaterally.  Labs   Recent Labs  09/20/14 0655  TROPONINI 0.29*   Lab Results  Component Value Date   WBC 12.9* 09/20/2014   HGB 10.5* 09/20/2014   HCT 31.2* 09/20/2014   MCV 76.8* 09/20/2014   PLT 196 09/20/2014     Recent Labs Lab 09/20/14 0655  NA 130*  K 5.8*  CL 102  CO2 17*  BUN 62*  CREATININE 3.74*  CALCIUM 9.3  PROT 7.7  BILITOT 0.7  ALKPHOS 71  ALT 43  AST 39  GLUCOSE 163*   Radiology/Studies Dg Chest Portable 1 View: 09/20/2014   CLINICAL DATA:  Short of breath  EXAM: PORTABLE  CHEST - 1 VIEW  COMPARISON:  07/13/2012  FINDINGS: Cardiac enlargement. Diffuse bilateral airspace disease is symmetric and most consistent with pulmonary edema. No significant pleural effusion. Mild atelectasis in the bases.  IMPRESSION: Pulmonary edema   Electronically Signed   By: Franchot Gallo M.D.   On: 09/20/2014 07:50    ECHO:   Study Conclusions - Left ventricle: The cavity size was normal. Wall thickness was increased in a pattern of mild LVH. Systolic function was normal. The estimated ejection fraction was in the range of 55% to 60%. Wall motion was normal; there were no regional wall motion abnormalities. Doppler parameters are consistent with abnormal left ventricular relaxation (grade 1 diastolic dysfunction). The E/e&' ratio is >15, suggesting elevated LV filling pressure. - Aortic valve: Trileaflet. Sclerosis without stenosis. There  was no regurgitation. - Mitral valve: Mildly thickened leaflets . There was no significant regurgitation. - Left atrium: The atrium was at the upper limits of normal in size. - Right ventricle: The cavity size was mildly dilated. - Right atrium: The atrium was mildly dilated. Central venous pressure (est): 15 mm Hg (in the setting of positive pressure ventilation) - Atrial septum: No defect or patent foramen ovale was identified. - Tricuspid valve: There was moderate regurgitation. - Pulmonary arteries: PA peak pressure: 55 mm Hg (S) + RAP. - Inferior vena cava: The vessel was dilated. The respirophasic diameter changes were blunted (< 50%), consistent with elevated central venous pressure.  Impressions: - LVEF 55-60%, mild LVH, diastolic dysfunction with elevated LV filling pressure, mild RAE, moderate TR with RVSP of 55 mmHG + RAP, dilated IVC but in the setting of PPV.  ECG: Junctional rhythm with rate in 50's. Left axis deviation. Inverted T-waves in inferior and lateral leads.   ASSESSMENT AND  PLAN  1. Acute on Chronic Diastolic Heart Failure - BNP elevated to 1077.8 on adission. CXR showing pulmonary edema, cardiac enlargement and mild atelectasis. - was on Lasix 20mg  daily PTA. - last echocardiogram in our record was in 07/2007 which showed EF of 55 to 60% but left ventricular wall thickness was at the upper limits of normal. - Echo on 09/20/2014 showed LVEF 55-60%, mild LVH, diastolic dysfunction with elevated LV filling pressure, mild RAE, moderate TR with RVSP of 55 mmHG +RAP, dilated IVC but in the setting of PPV and Grade 1 Diastolic Dysfunction. - Has received one dose of Lasix 40mg  IV thus far. Net Output -1.0 L.   2. Elevated Troponin - elevated to 0.29 in the ED. Likely represents demand ischemia. Cyclic troponins are pending. - last cath in 2009 showing LAD with 15-20% proximal and mid stenosis, left circumflex with 5-10% proximal stenosis, RCA with 20-25% mid stenosis. - EKG showed Junctional rhythm with inverted T-waves in inferior and lateral leads. However, inverted T-waves were noted on previous EKG in 2014. - patient remains without chest pain at this time.  3. Acute on Chronic Renal Failure - baseline of 1.44. Elevated to 3.74 on admission. - will attempt to avoid nephrotoxic medications  4. HTN - BP has been 107/70 - 181/71 while admitted. - add back home medical regimen as tolerated.  5. HLD - continue statin therapy  6. Hyperkalemia - Potassium elevated to 5.8 on admission. - per admitting team  7. Hypoxic Respiratory Failure - thought to be secondary to pneumonia (recently diagnosed on 09/19/2014). - Currently on Vanc, Ceftazidie, and Levaquin - per admitting team  8. Alzheimer's Disease - patient able to provided very limited history.  9. Type 2 DM - per admitting team  Otherwise, per admitting team.   Signed, Erma Heritage, PA-C 09/20/2014, 2:39 PM Pager: [phone]

## 2014-09-20 NOTE — Progress Notes (Signed)
Pt returned to BIPAP @ 1955. Upon arrival pt was SOB, increased WOB and Sats decreased to 92%. BIPAP 12/6, 60% FiO2.

## 2014-09-20 NOTE — Progress Notes (Signed)
Utilization Review Completed.Donne Anon T9/09/2014

## 2014-09-20 NOTE — ED Notes (Signed)
Pt here from [facility] for resp distress, recently started augmentin for pna and today woke up sats in 70's on ra, on nrb increased to 80's and on cpap on arrival sats 93 %. Pt is a renal failure pt, cancer patient with colostomy. Advanced dementia

## 2014-09-20 NOTE — ED Provider Notes (Signed)
Medical screening examination/treatment/procedure(s) were conducted as a shared visit with non-physician practitioner(s) and myself.  I personally evaluated the patient during the encounter.   EKG Interpretation   Date/Time:  Friday September 20 2014 06:50:30 EDT Ventricular Rate:  104 PR Interval:    QRS Duration: 109 QT Interval:  437 QTC Calculation: 425 R Axis:   -67 Text Interpretation:  Junctional rhythm Left axis deviation Abnormal T,  probable ischemia, widespread Abnormal T waves inferiorly and laterally  most promonently Confirmed by Mykenzie Ebanks  MD, Loma Sousa ([id number]) on 09/20/2014  7:33:25 AM        This is a 69 year old male with a history of dementia diabetes, renal insufficiency, hypertension, diabetes, urinary tract infections who presented in respiratory distress. Per report, patient was diagnosed with pneumonia and started on Augmentin yesterday. He was noted to have oxygen saturations in the 70s by EMS. He was placed on CPAP. With improvement of his oxygenation. Patient also with a history of heart failure. He is unable to provide history secondary to mental status and acuity of condition. Denies any chest pain at this time. On physical exam is chronically ill-appearing. On CPAP. Fair air movement bilaterally.  1+ BLE edema.  Reviewed recent labwork at facility.  Crt ~4.0, Potassium 5.7.  New Deep twave inversions anteriorly.  Sepsis w/u initiated. Given Vanc and ZOsyn.   INitial abg with repiratory acidosis.  Patient tolerating BiPAP well.  Patient with WBC of 12, Crt 3.8 and K of 5.8.  Given insulin, glucose, kayexalate.  On exogenous K.  CXR with pulmonary edema and BNP >1000 with Trop of 0.29.  Suspect PNA in addition to acute heart failure.  Patient's fluids stopped.  Posey Pronto, [place] discussed with critical care who will evaluate.  Cardiology to consult as well.  CRITICAL CARE Performed by: Merryl Hacker   Total critical care time: 45 min  Critical care time was exclusive of  separately billable procedures and treating other patients.  Critical care was necessary to treat or prevent imminent or life-threatening deterioration.  Critical care was time spent personally by me on the following activities: development of treatment plan with patient and/or surrogate as well as nursing, discussions with consultants, evaluation of patient's response to treatment, examination of patient, obtaining history from patient or surrogate, ordering and performing treatments and interventions, ordering and review of laboratory studies, ordering and review of radiographic studies, pulse oximetry and re-evaluation of patient's condition.  DIagnosis:  Acute respiratory failure HCAP Acute on Chronic Heart Failure  Results for orders placed or performed during the hospital encounter of 09/20/14  Blood Culture (routine x 2)  Result Value Ref Range   Specimen Description BLOOD LEFT ARM    Special Requests BOTTLES DRAWN AEROBIC AND ANAEROBIC 10CC    Culture PENDING    Report Status PENDING   Blood Culture (routine x 2)  Result Value Ref Range   Specimen Description BLOOD LEFT HAND    Special Requests BOTTLES DRAWN AEROBIC AND ANAEROBIC 10CC    Culture PENDING    Report Status PENDING   Comprehensive metabolic panel  Result Value Ref Range   Sodium 130 (L) 135 - 145 mmol/L   Potassium 5.8 (H) 3.5 - 5.1 mmol/L   Chloride 102 101 - 111 mmol/L   CO2 17 (L) 22 - 32 mmol/L   Glucose, Bld 163 (H) 65 - 99 mg/dL   BUN 62 (H) 6 - 20 mg/dL   Creatinine, Ser 3.74 (H) 0.61 - 1.24 mg/dL   Calcium 9.3  8.9 - 10.3 mg/dL   Total Protein 7.7 6.5 - 8.1 g/dL   Albumin 2.7 (L) 3.5 - 5.0 g/dL   AST 39 15 - 41 U/L   ALT 43 17 - 63 U/L   Alkaline Phosphatase 71 38 - 126 U/L   Total Bilirubin 0.7 0.3 - 1.2 mg/dL   GFR calc non Af Amer 15 (L) >60 mL/min   GFR calc Af Amer 18 (L) >60 mL/min   Anion gap 11 5 - 15  CBC WITH DIFFERENTIAL  Result Value Ref Range   WBC 12.9 (H) 4.0 - 10.5 K/uL   RBC  4.06 (L) 4.22 - 5.81 MIL/uL   Hemoglobin 10.5 (L) 13.0 - 17.0 g/dL   HCT 31.2 (L) 39.0 - 52.0 %   MCV 76.8 (L) 78.0 - 100.0 fL   MCH 25.9 (L) 26.0 - 34.0 pg   MCHC 33.7 30.0 - 36.0 g/dL   RDW 15.9 (H) 11.5 - 15.5 %   Platelets 196 150 - 400 K/uL   Neutrophils Relative % 84 (H) 43 - 77 %   Neutro Abs 10.8 (H) 1.7 - 7.7 K/uL   Lymphocytes Relative 7 (L) 12 - 46 %   Lymphs Abs 0.8 0.7 - 4.0 K/uL   Monocytes Relative 9 3 - 12 %   Monocytes Absolute 1.2 (H) 0.1 - 1.0 K/uL   Eosinophils Relative 1 0 - 5 %   Eosinophils Absolute 0.1 0.0 - 0.7 K/uL   Basophils Relative 0 0 - 1 %   Basophils Absolute 0.0 0.0 - 0.1 K/uL  Urinalysis, Routine w reflex microscopic (not at [facility])  Result Value Ref Range   Color, Urine YELLOW YELLOW   APPearance CLOUDY (A) CLEAR   Specific Gravity, Urine 1.017 1.005 - 1.030   pH 5.0 5.0 - 8.0   Glucose, UA NEGATIVE NEGATIVE mg/dL   Hgb urine dipstick NEGATIVE NEGATIVE   Bilirubin Urine NEGATIVE NEGATIVE   Ketones, ur NEGATIVE NEGATIVE mg/dL   Protein, ur 30 (A) NEGATIVE mg/dL   Urobilinogen, UA 0.2 0.0 - 1.0 mg/dL   Nitrite NEGATIVE NEGATIVE   Leukocytes, UA MODERATE (A) NEGATIVE  Troponin I  Result Value Ref Range   Troponin I 0.29 (H) <0.031 ng/mL  Brain natriuretic peptide  Result Value Ref Range   B Natriuretic Peptide 1077.8 (H) 0.0 - 100.0 pg/mL  Urine microscopic-add on  Result Value Ref Range   Squamous Epithelial / LPF RARE RARE   WBC, UA TOO NUMEROUS TO COUNT <3 WBC/hpf   RBC / HPF 0-2 <3 RBC/hpf   Bacteria, UA FEW (A) RARE   Casts HYALINE CASTS (A) NEGATIVE  I-Stat CG4 Lactic Acid, ED  (not at  [facility])  Result Value Ref Range   Lactic Acid, Venous 1.51 0.5 - 2.0 mmol/L  I-Stat Arterial Blood Gas, ED - (order at [facility] and [facility] only)  Result Value Ref Range   pH, Arterial 7.260 (L) 7.350 - 7.450   pCO2 arterial 36.6 35.0 - 45.0 mmHg   pO2, Arterial 60.0 (L) 80.0 - 100.0 mmHg   Bicarbonate 16.4 (L) 20.0 - 24.0 mEq/L   TCO2 18 0 - 100 mmol/L    O2 Saturation 87.0 %   Acid-base deficit 10.0 (H) 0.0 - 2.0 mmol/L   Patient temperature 98.6 F    Collection site RADIAL, ALLEN'S TEST ACCEPTABLE    Drawn by RT    Sample type ARTERIAL    Dg Chest Portable 1 View  09/20/2014   CLINICAL DATA:  Short of breath  EXAM: PORTABLE CHEST - 1 VIEW  COMPARISON:  07/13/2012  FINDINGS: Cardiac enlargement. Diffuse bilateral airspace disease is symmetric and most consistent with pulmonary edema. No significant pleural effusion. Mild atelectasis in the bases.  IMPRESSION: Pulmonary edema   Electronically Signed   By: Franchot Gallo M.D.   On: 09/20/2014 07:50      Merryl Hacker, MD 09/20/14 [phone]

## 2014-09-20 NOTE — ED Notes (Signed)
MD at bedside. 

## 2014-09-20 NOTE — Progress Notes (Signed)
  Echocardiogram 2D Echocardiogram has been performed.  Darlina Sicilian M 09/20/2014, 3:32 PM

## 2014-09-21 LAB — URINALYSIS, ROUTINE W REFLEX MICROSCOPIC
Bilirubin Urine: NEGATIVE
Glucose, UA: NEGATIVE mg/dL
Ketones, ur: NEGATIVE mg/dL
Nitrite: NEGATIVE
PROTEIN: NEGATIVE mg/dL
Specific Gravity, Urine: 1.008 (ref 1.005–1.030)
UROBILINOGEN UA: 0.2 mg/dL (ref 0.0–1.0)
pH: 5 (ref 5.0–8.0)

## 2014-09-21 LAB — RENAL FUNCTION PANEL
ALBUMIN: 2.4 g/dL — AB (ref 3.5–5.0)
ANION GAP: 15 (ref 5–15)
BUN: 59 mg/dL — ABNORMAL HIGH (ref 6–20)
CALCIUM: 8.4 mg/dL — AB (ref 8.9–10.3)
CO2: 16 mmol/L — ABNORMAL LOW (ref 22–32)
CREATININE: 3.42 mg/dL — AB (ref 0.61–1.24)
Chloride: 109 mmol/L (ref 101–111)
GFR calc non Af Amer: 17 mL/min — ABNORMAL LOW (ref >60)
GFR, EST AFRICAN AMERICAN: 20 mL/min — AB (ref >60)
Glucose, Bld: 98 mg/dL (ref 65–99)
PHOSPHORUS: 5.9 mg/dL — AB (ref 2.5–4.6)
Potassium: 3.8 mmol/L (ref 3.5–5.1)
SODIUM: 140 mmol/L (ref 135–145)

## 2014-09-21 LAB — GLUCOSE, CAPILLARY
GLUCOSE-CAPILLARY: 106 mg/dL — AB (ref 65–99)
Glucose-Capillary: 101 mg/dL — ABNORMAL HIGH (ref 65–99)
Glucose-Capillary: 107 mg/dL — ABNORMAL HIGH (ref 65–99)
Glucose-Capillary: 123 mg/dL — ABNORMAL HIGH (ref 65–99)
Glucose-Capillary: 139 mg/dL — ABNORMAL HIGH (ref 65–99)
Glucose-Capillary: 94 mg/dL (ref 65–99)

## 2014-09-21 LAB — BASIC METABOLIC PANEL
Anion gap: 11 (ref 5–15)
BUN: 62 mg/dL — AB (ref 6–20)
CALCIUM: 8.6 mg/dL — AB (ref 8.9–10.3)
CO2: 20 mmol/L — AB (ref 22–32)
CREATININE: 3.38 mg/dL — AB (ref 0.61–1.24)
Chloride: 108 mmol/L (ref 101–111)
GFR calc non Af Amer: 17 mL/min — ABNORMAL LOW (ref >60)
GFR, EST AFRICAN AMERICAN: 20 mL/min — AB (ref >60)
Glucose, Bld: 109 mg/dL — ABNORMAL HIGH (ref 65–99)
Potassium: 3.7 mmol/L (ref 3.5–5.1)
SODIUM: 139 mmol/L (ref 135–145)

## 2014-09-21 LAB — CBC WITH DIFFERENTIAL/PLATELET
BASOS ABS: 0 10*3/uL (ref 0.0–0.1)
Basophils Relative: 0 % (ref 0–1)
Eosinophils Absolute: 0.1 10*3/uL (ref 0.0–0.7)
Eosinophils Relative: 1 % (ref 0–5)
HEMATOCRIT: 28.6 % — AB (ref 39.0–52.0)
HEMOGLOBIN: 9.7 g/dL — AB (ref 13.0–17.0)
LYMPHS PCT: 4 % — AB (ref 12–46)
Lymphs Abs: 0.4 10*3/uL — ABNORMAL LOW (ref 0.7–4.0)
MCH: 26.3 pg (ref 26.0–34.0)
MCHC: 33.9 g/dL (ref 30.0–36.0)
MCV: 77.5 fL — AB (ref 78.0–100.0)
Monocytes Absolute: 1.1 10*3/uL — ABNORMAL HIGH (ref 0.1–1.0)
Monocytes Relative: 11 % (ref 3–12)
NEUTROS ABS: 8.9 10*3/uL — AB (ref 1.7–7.7)
NEUTROS PCT: 84 % — AB (ref 43–77)
Platelets: 187 10*3/uL (ref 150–400)
RBC: 3.69 MIL/uL — AB (ref 4.22–5.81)
RDW: 15.8 % — ABNORMAL HIGH (ref 11.5–15.5)
WBC: 10.6 10*3/uL — AB (ref 4.0–10.5)

## 2014-09-21 LAB — URINE MICROSCOPIC-ADD ON

## 2014-09-21 LAB — URINE CULTURE

## 2014-09-21 LAB — HEMOGLOBIN A1C
HEMOGLOBIN A1C: 6.8 % — AB (ref 4.8–5.6)
Mean Plasma Glucose: 148 mg/dL

## 2014-09-21 LAB — TROPONIN I: TROPONIN I: 0.3 ng/mL — AB (ref <0.031)

## 2014-09-21 LAB — MAGNESIUM
MAGNESIUM: 2 mg/dL (ref 1.7–2.4)
Magnesium: 1.8 mg/dL (ref 1.7–2.4)

## 2014-09-21 LAB — PROCALCITONIN: PROCALCITONIN: 6.13 ng/mL

## 2014-09-21 NOTE — Progress Notes (Signed)
Pt removed from BIPAP. Placed on NRB. Vitals: HR: 75, RR: 28, Sats: 99%(NRB) Will titrate O2. Pt resting. No SOB or distress. BIPAP at bedside.

## 2014-09-21 NOTE — Progress Notes (Signed)
PULMONARY / CRITICAL CARE MEDICINE   Name: [name] MRN: [id number] DOB: 1945-03-24    ADMISSION DATE:  09/20/2014  REFERRING MD :  EDP  CHIEF COMPLAINT:  SOB  INITIAL PRESENTATION: 69 y/o M with PMH of dementia admitted on 9/9 with acute hypoxic respiratory failure - CHF vs PNA.   STUDIES:  9/09 Renal US >> findings consistent with medial renal disease, no hydronephrosis   SIGNIFICANT EVENTS: 9/09  Admit with acute hypoxic respiratory failure, concern for CHF vs PNA    SUBJECTIVE:   VITAL SIGNS: Temp:  [97.8 F (36.6 C)-99.7 F (37.6 C)] 99.6 F (37.6 C) (09/10 1148) Pulse Rate:  [66-123] 75 (09/10 1220) Resp:  [20-39] 37 (09/10 1220) BP: (118-171)/(55-91) 139/55 mmHg (09/10 1220) SpO2:  [94 %-100 %] 100 % (09/10 1220) FiO2 (%):  [40 %-100 %] 60 % (09/10 1220) Weight:  [240 lb 1.3 oz (108.9 kg)] 240 lb 1.3 oz (108.9 kg) (09/10 0500)   HEMODYNAMICS:     VENTILATOR SETTINGS: Vent Mode:  [-]  FiO2 (%):  [40 %-100 %] 60 %   INTAKE / OUTPUT:  Intake/Output Summary (Last 24 hours) at 09/21/14 1313 Last data filed at 09/21/14 1200  Gross per 24 hour  Intake    250 ml  Output   5600 ml  Net  -5350 ml    PHYSICAL EXAMINATION: General:  EAAMNAD on NIMVS Neuro: Follows commands. Not orientated  HEENT:  Poor detention  Cardiovascular:  HSR ssb Lungs:  Decreased bs, basilar crackles, rr 33 Abdomen:  Colostomy/peg in place Musculoskeletal: intact Skin:  Lower ext venous stasis and +++ edema  LABS:  CBC  Recent Labs Lab 09/20/14 0655 09/21/14 0250  WBC 12.9* 10.6*  HGB 10.5* 9.7*  HCT 31.2* 28.6*  PLT 196 187   Coag's No results for input(s): APTT, INR in the last 168 hours.   BMET  Recent Labs Lab 09/20/14 1500 09/21/14 0250 09/21/14 0500  NA 133* 140 139  K 4.6 3.8 3.7  CL 105 109 108  CO2 18* 16* 20*  BUN 60* 59* 62*  CREATININE 3.52* 3.42* 3.38*  GLUCOSE 204* 98 109*   Electrolytes  Recent Labs Lab 09/20/14 1500  09/21/14 0250 09/21/14 0500  CALCIUM 8.7* 8.4* 8.6*  MG  --  1.8 2.0  PHOS  --  5.9*  --    Sepsis Markers  Recent Labs Lab 09/20/14 0955 09/20/14 1011 09/20/14 1500 09/20/14 2022 09/21/14 0250  LATICACIDVEN  --  1.71 1.5 1.1  --   PROCALCITON 6.67  --   --   --  6.13   ABG  Recent Labs Lab 09/20/14 0652  PHART 7.260*  PCO2ART 36.6  PO2ART 60.0*   Liver Enzymes  Recent Labs Lab 09/20/14 0655 09/21/14 0250  AST 39  --   ALT 43  --   ALKPHOS 71  --   BILITOT 0.7  --   ALBUMIN 2.7* 2.4*   Cardiac Enzymes  Recent Labs Lab 09/20/14 1500 09/20/14 2021 09/21/14 0250  TROPONINI 0.27* 0.29* 0.30*   Glucose  Recent Labs Lab 09/20/14 1532 09/20/14 1916 09/20/14 2339 09/21/14 0401 09/21/14 0802 09/21/14 1149  GLUCAP 130* 98 94 106* 101* 107*    Imaging US Renal  09/20/2014   CLINICAL DATA:  Acute on chronic renal failure.  EXAM: RENAL / URINARY TRACT ULTRASOUND COMPLETE  COMPARISON:  06/14/2012  FINDINGS: Right Kidney:  Length: 11.3 cm. Diffuse parenchymal atrophy with diffusely increased parenchymal echotexture suggesting medical renal disease.  Mild scarring. Focal prominence in the upper mid pole probably representing prominent column of Bertin rather than cyst. No hydronephrosis.  Left Kidney:  Length: 11.2 cm. Diffuse parenchymal atrophy with increased parenchymal echotexture suggesting chronic medical renal disease. Mild scarring. No hydronephrosis.  Bladder:  Limited visualization of the bladder. Poor penetration. No discrete filling defect or wall thickening appreciated as visualized.  IMPRESSION: Diffuse parenchymal atrophy and increased parenchymal echotexture in both kidneys consistent with chronic medical renal disease. No hydronephrosis.   Electronically Signed   By: Lucienne Capers M.D.   On: 09/20/2014 23:05     ASSESSMENT / PLAN:  PULMONARY OETT A: Acute Hypoxic Respiratory Failure - in setting of diffuse bilateral infiltrate, PNA vs CHF   P:   NIMV as needed for increased work of breathing  Intermittent CXR  Pulmonary hygiene: IS, mobilize  BP control  Lasix as below   CARDIOVASCULAR CVL A:  ? CHF exacerbation - hx of, diffuse bilateral infiltrates HTN  R/O MI P:  Lasix 40 BID x 3 doses (initiated on 9/10) Trend troponin, BNP Tele monitoring  Ensure adequate BP / rate control   RENAL A:   Acute on Chronic Renal Failure P:   Avoid nephrotoxins Foley with strict I/O's  Renal US consistent with medical renal disease, no hydronephrosis Lasix as above Trend BMP  Replace electrolytes as indicated   GASTROINTESTINAL A:   Hx Colon CA s/p colectomy with colostomy & G-Tube P:   NPO  G-tube for TF / meds PPI  HEMATOLOGIC A:  Anemia of chronic disease  P:  Trend CBC Heparin for DVT prophylaxis   INFECTIOUS A:  Diffuse bilateral infiltrates - edema vs CHF, r/o infectious etiology. On Augmentin (9/8) prior to admit SNF Resident  P:   BCx2 9/9 >> UC 9/9 >> multiple species present RVP 9/9 >>   Abx:  9/9 vanc >> 9/9 Fortaz >> 9/9 Levaquin >>  Trend PCT for abx duration   ENDOCRINE A:   DM   P:   SSI  NEUROLOGIC A:   Advanced Dementia Hx of strokes P:   RASS goal:0 Avoid sedation   FAMILY  - Updates: No family at bedside  - Inter-disciplinary family meet or Palliative Care meeting due by:  day [place], NP-C [facility] Pgr: [phone] or if no answer [phone] 09/21/2014, 1:13 PM

## 2014-09-21 NOTE — Progress Notes (Signed)
Attempted to wean pt to venturi mask.  Pt did not tolerate. Pt SPO2 dropped to 80 quickly with good wave form.

## 2014-09-21 NOTE — Progress Notes (Signed)
Subjective:  Bipap. . Not very talkative. A bit better  Objective:  Vital Signs in the last 24 hours: Temp:  [97.8 F (36.6 C)-99.7 F (37.6 C)] 99.6 F (37.6 C) (09/10 1148) Pulse Rate:  [66-123] 75 (09/10 1220) Resp:  [20-39] 37 (09/10 1220) BP: (118-171)/(55-91) 139/55 mmHg (09/10 1220) SpO2:  [94 %-100 %] 100 % (09/10 1220) FiO2 (%):  [40 %-100 %] 60 % (09/10 1220) Weight:  [240 lb 1.3 oz (108.9 kg)] 240 lb 1.3 oz (108.9 kg) (09/10 0500)  Intake/Output from previous day: 09/09 0701 - 09/10 0700 In: 200 [IV Piggyback:200] Out: 9798 [Urine:3000; Stool:1750]   Physical Exam: General: Ill appearing, Bipap O2. in no acute distress. Head:  Normocephalic and atraumatic. Lungs: Mild crackles bases B. Heart: Normal S1 and S2.  No murmur, rubs or gallops.  Abdomen: soft, non-tender, positive bowel sounds. Extremities: No clubbing or cyanosis. 1+ LE B edema. Neurologic: Alert and oriented x 3.    Lab Results:  Recent Labs  09/20/14 0655 09/21/14 0250  WBC 12.9* 10.6*  HGB 10.5* 9.7*  PLT 196 187    Recent Labs  09/21/14 0250 09/21/14 0500  NA 140 139  K 3.8 3.7  CL 109 108  CO2 16* 20*  GLUCOSE 98 109*  BUN 59* 62*  CREATININE 3.42* 3.38*    Recent Labs  09/20/14 2021 09/21/14 0250  TROPONINI 0.29* 0.30*   Hepatic Function Panel  Recent Labs  09/20/14 0655 09/21/14 0250  PROT 7.7  --   ALBUMIN 2.7* 2.4*  AST 39  --   ALT 43  --   ALKPHOS 71  --   BILITOT 0.7  --    No results for input(s): CHOL in the last 72 hours. No results for input(s): PROTIME in the last 72 hours.  Imaging: US Renal  09/20/2014   CLINICAL DATA:  Acute on chronic renal failure.  EXAM: RENAL / URINARY TRACT ULTRASOUND COMPLETE  COMPARISON:  06/14/2012  FINDINGS: Right Kidney:  Length: 11.3 cm. Diffuse parenchymal atrophy with diffusely increased parenchymal echotexture suggesting medical renal disease. Mild scarring. Focal prominence in the upper mid pole probably  representing prominent column of Bertin rather than cyst. No hydronephrosis.  Left Kidney:  Length: 11.2 cm. Diffuse parenchymal atrophy with increased parenchymal echotexture suggesting chronic medical renal disease. Mild scarring. No hydronephrosis.  Bladder:  Limited visualization of the bladder. Poor penetration. No discrete filling defect or wall thickening appreciated as visualized.  IMPRESSION: Diffuse parenchymal atrophy and increased parenchymal echotexture in both kidneys consistent with chronic medical renal disease. No hydronephrosis.   Electronically Signed   By: Lucienne Capers M.D.   On: 09/20/2014 23:05   Dg Chest Portable 1 View  09/20/2014   CLINICAL DATA:  Short of breath  EXAM: PORTABLE CHEST - 1 VIEW  COMPARISON:  07/13/2012  FINDINGS: Cardiac enlargement. Diffuse bilateral airspace disease is symmetric and most consistent with pulmonary edema. No significant pleural effusion. Mild atelectasis in the bases.  IMPRESSION: Pulmonary edema   Electronically Signed   By: Franchot Gallo M.D.   On: 09/20/2014 07:50   Personally viewed.   Telemetry: No adverse rhythms Personally viewed.   EKG:  Junctional rhythm with rate in 50's. Left axis deviation. Inverted T-waves in inferior and lateral leads.  Cardiac Studies:   ECHO Study Conclusions - Left ventricle: The cavity size was normal. Wall thickness was increased in a pattern of mild LVH. Systolic function was normal. The estimated ejection fraction was in  the range of 55% to 60%. Wall motion was normal; there were no regional wall motion abnormalities. Doppler parameters are consistent with abnormal left ventricular relaxation (grade 1 diastolic dysfunction). The E/e&' ratio is >15, suggesting elevated LV filling pressure. - Aortic valve: Trileaflet. Sclerosis without stenosis. There was no regurgitation. - Mitral valve: Mildly thickened leaflets . There was no significant regurgitation. - Left atrium: The atrium  was at the upper limits of normal in size. - Right ventricle: The cavity size was mildly dilated. - Right atrium: The atrium was mildly dilated. Central venous pressure (est): 15 mm Hg (in the setting of positive pressure ventilation) - Atrial septum: No defect or patent foramen ovale was identified. - Tricuspid valve: There was moderate regurgitation. - Pulmonary arteries: PA peak pressure: 55 mm Hg (S) + RAP. - Inferior vena cava: The vessel was dilated. The respirophasic diameter changes were blunted (< 50%), consistent with elevated central venous pressure.   Impressions: - LVEF 55-60%, mild LVH, diastolic dysfunction with elevated LV filling pressure, mild RAE, moderate TR with RVSP of 55 mmHG + RAP, dilated IVC but in the setting of PPV.  Scheduled Meds: . antiseptic oral rinse  7 mL Mouth Rinse q12n4p  . cefTAZidime (FORTAZ)  IV  2 g Intravenous Q24H  . chlorhexidine  15 mL Mouth Rinse BID  . Chlorhexidine Gluconate Cloth  6 each Topical Q0600  . furosemide  40 mg Intravenous BID  . heparin  5,000 Units Subcutaneous 3 times per day  . insulin aspart  0-9 Units Subcutaneous 6 times per day  . [START ON 09/22/2014] levofloxacin (LEVAQUIN) IV  500 mg Intravenous Q48H  . mupirocin ointment  1 application Nasal BID  . pantoprazole (PROTONIX) IV  40 mg Intravenous Q24H  . [START ON 09/22/2014] vancomycin  1,500 mg Intravenous Q48H   Continuous Infusions:  PRN Meds:.sodium chloride   Assessment/Plan:  Active Problems:   Alzheimer's disease   Chronic kidney disease   Hyperlipidemia   Acute on chronic diastolic congestive heart failure   Elevated troponin   Essential hypertension   Acute renal failure superimposed on stage 3 chronic kidney disease   Hyperkalemia   Congestive heart disease  69 year old with the following issues:  1. Acute on Chronic Diastolic Heart Failure - BNP elevated to 1077.8 on admission. CXR showing pulmonary edema, cardiac enlargement  and mild atelectasis. - was on Lasix 20mg  daily PTA. - last echocardiogram in our record was in 07/2007 which showed EF of 55 to 60% but left ventricular wall thickness was at the upper limits of normal. - Echo on 09/20/2014 showed LVEF 55-60%, mild LVH, diastolic dysfunction with elevated LV filling pressure, mild RAE, moderate TR with RVSP of 55 mmHG +RAP, dilated IVC but in the setting of PPV and Grade 1 Diastolic Dysfunction. - Has received Lasix 40mg  IV  Net Output -6.0 L. Excellent response.  - BUN increased mildly from yesterday. Still on BiPap - Lets continue IV lasix for now. Watch renal fnx closely.      2. Elevated Troponin - elevated to 0.29-0.3. Likely represents demand ischemia. Cyclic troponins. - last cath in 2009 showing LAD with 15-20% proximal and mid stenosis, left circumflex with 5-10% proximal stenosis, RCA with 20-25% mid stenosis. - EKG showed Junctional rhythm with inverted T-waves in inferior and lateral leads. However, inverted T-waves were noted on previous EKG in 2014. - now NSR on Tele.  - patient remains without chest pain at this time  3. Acute on  Chronic Renal Failure - baseline of 1.44. Elevated to 3.74 on admission. - will attempt to avoid nephrotoxic medications - continue lasix IV. Mild improvement in creat. Now 3.38  4. HTN - BP has been 107/70 - 181/71 while admitted. - add back home medical regimen as tolerated. - continue diuresis   5. HLD - continue statin therapy  6. Hyperkalemia - Potassium elevated to 5.8 on admission. - per admitting team - now 3.7  7. Hypoxic Respiratory Failure - thought to be secondary to pneumonia (recently diagnosed on 09/19/2014). - Currently on Vanc, Ceftazidie, and Levaquin - per admitting team  8. Alzheimer's Disease - patient able to provided very limited history.  9. Type 2 DM - per admitting team  SKAINS, [place] 09/21/2014, 1:06 PM

## 2014-09-22 ENCOUNTER — Inpatient Hospital Stay (HOSPITAL_COMMUNITY): Payer: Medicare Other

## 2014-09-22 DIAGNOSIS — N189 Chronic kidney disease, unspecified: Secondary | ICD-10-CM

## 2014-09-22 LAB — BASIC METABOLIC PANEL
Anion gap: 13 (ref 5–15)
BUN: 68 mg/dL — AB (ref 6–20)
CHLORIDE: 109 mmol/L (ref 101–111)
CO2: 24 mmol/L (ref 22–32)
Calcium: 8.8 mg/dL — ABNORMAL LOW (ref 8.9–10.3)
Creatinine, Ser: 3.25 mg/dL — ABNORMAL HIGH (ref 0.61–1.24)
GFR calc Af Amer: 21 mL/min — ABNORMAL LOW (ref >60)
GFR, EST NON AFRICAN AMERICAN: 18 mL/min — AB (ref >60)
GLUCOSE: 128 mg/dL — AB (ref 65–99)
POTASSIUM: 3.7 mmol/L (ref 3.5–5.1)
Sodium: 146 mmol/L — ABNORMAL HIGH (ref 135–145)

## 2014-09-22 LAB — CBC
HEMATOCRIT: 30 % — AB (ref 39.0–52.0)
Hemoglobin: 10.5 g/dL — ABNORMAL LOW (ref 13.0–17.0)
MCH: 26.4 pg (ref 26.0–34.0)
MCHC: 35 g/dL (ref 30.0–36.0)
MCV: 75.4 fL — AB (ref 78.0–100.0)
Platelets: 222 10*3/uL (ref 150–400)
RBC: 3.98 MIL/uL — ABNORMAL LOW (ref 4.22–5.81)
RDW: 15.1 % (ref 11.5–15.5)
WBC: 8.4 10*3/uL (ref 4.0–10.5)

## 2014-09-22 LAB — URINE CULTURE: Culture: NO GROWTH

## 2014-09-22 LAB — GLUCOSE, CAPILLARY
GLUCOSE-CAPILLARY: 117 mg/dL — AB (ref 65–99)
GLUCOSE-CAPILLARY: 135 mg/dL — AB (ref 65–99)
GLUCOSE-CAPILLARY: 120 mg/dL — AB (ref 65–99)
GLUCOSE-CAPILLARY: 153 mg/dL — AB (ref 65–99)
Glucose-Capillary: 113 mg/dL — ABNORMAL HIGH (ref 65–99)
Glucose-Capillary: 137 mg/dL — ABNORMAL HIGH (ref 65–99)

## 2014-09-22 LAB — PROCALCITONIN: Procalcitonin: 3.91 ng/mL

## 2014-09-22 LAB — BRAIN NATRIURETIC PEPTIDE: B NATRIURETIC PEPTIDE 5: 589.5 pg/mL — AB (ref 0.0–100.0)

## 2014-09-22 MED ORDER — FUROSEMIDE 10 MG/ML IJ SOLN
40.0000 mg | Freq: Every day | INTRAMUSCULAR | Status: DC
  Administered 2014-09-22 – 2014-09-23 (×2): 40 mg via INTRAVENOUS
  Filled 2014-09-22 (×2): qty 4

## 2014-09-22 MED ORDER — ASPIRIN 81 MG PO CHEW
81.0000 mg | CHEWABLE_TABLET | Freq: Every day | ORAL | Status: DC
  Administered 2014-09-22 – 2014-09-23 (×2): 81 mg
  Filled 2014-09-22 (×2): qty 1

## 2014-09-22 MED ORDER — SERTRALINE HCL 50 MG PO TABS
150.0000 mg | ORAL_TABLET | Freq: Every day | ORAL | Status: DC
  Administered 2014-09-22 – 2014-09-23 (×2): 150 mg
  Filled 2014-09-22 (×2): qty 1

## 2014-09-22 MED ORDER — DONEPEZIL HCL 10 MG PO TABS
10.0000 mg | ORAL_TABLET | Freq: Every day | ORAL | Status: DC
Start: 2014-09-22 — End: 2014-09-23
  Administered 2014-09-22: 10 mg
  Filled 2014-09-22 (×2): qty 1

## 2014-09-22 MED ORDER — QUETIAPINE FUMARATE 25 MG PO TABS
25.0000 mg | ORAL_TABLET | Freq: Every day | ORAL | Status: DC
  Administered 2014-09-22: 25 mg
  Filled 2014-09-22 (×2): qty 1

## 2014-09-22 MED ORDER — VITAL HIGH PROTEIN PO LIQD
1000.0000 mL | ORAL | Status: DC
  Administered 2014-09-22 – 2014-09-23 (×2): 1000 mL
  Filled 2014-09-22 (×3): qty 1000

## 2014-09-22 NOTE — Progress Notes (Signed)
PULMONARY / CRITICAL CARE MEDICINE   Name: [name] MRN: [id number] DOB: [birth year]    ADMISSION DATE:  09/20/2014  REFERRING MD :  EDP  CHIEF COMPLAINT:  SOB  INITIAL PRESENTATION: 69 y/o M with PMH of dementia admitted on 9/9 with acute hypoxic respiratory failure - CHF vs PNA.   STUDIES:  9/09 Renal US >> findings consistent with medial renal disease, no hydronephrosis   SIGNIFICANT EVENTS: 9/09  Admit with acute hypoxic respiratory failure, concern for CHF vs PNA    SUBJECTIVE: RT reports pt did not tolerated bipap removal late PM / overnight.  Neg 6810ml since admit.  Remains on bipap 12/6, pulling Vt 400's  VITAL SIGNS: Temp:  [98.4 F (36.9 C)-99.6 F (37.6 C)] 98.5 F (36.9 C) (09/11 0755) Pulse Rate:  [36-134] 61 (09/11 0800) Resp:  [18-37] 26 (09/11 0800) BP: (120-176)/(44-88) 139/61 mmHg (09/11 0800) SpO2:  [96 %-100 %] 100 % (09/11 0800) FiO2 (%):  [40 %-70 %] 50 % (09/11 0400) Weight:  [231 lb 14.8 oz (105.2 kg)] 231 lb 14.8 oz (105.2 kg) (09/11 0500)   HEMODYNAMICS:     VENTILATOR SETTINGS: Vent Mode:  [-]  FiO2 (%):  [40 %-70 %] 50 %   INTAKE / OUTPUT:  Intake/Output Summary (Last 24 hours) at 09/22/14 0902 Last data filed at 09/22/14 1829  Gross per 24 hour  Intake    745 ml  Output   2645 ml  Net  -1900 ml    PHYSICAL EXAMINATION: General:  Elderly male in NAD on BiPAP  Neuro: Follows commands. Not orientated  HEENT:  Poor detention  Cardiovascular:  s1s2 rrr, no m/r/g Lungs: even/non-labored, lungs bilaterally diminished  Abdomen:  Colostomy/peg in place Musculoskeletal: intact Skin:  Lower ext venous stasis and 1+ edema  LABS:  CBC  Recent Labs Lab 09/20/14 0655 09/21/14 0250 09/22/14 0221  WBC 12.9* 10.6* 8.4  HGB 10.5* 9.7* 10.5*  HCT 31.2* 28.6* 30.0*  PLT 196 187 222   Coag's No results for input(s): APTT, INR in the last 168 hours.   BMET  Recent Labs Lab 09/21/14 0250 09/21/14 0500 09/22/14 0221  NA  140 139 146*  K 3.8 3.7 3.7  CL 109 108 109  CO2 16* 20* 24  BUN 59* 62* 68*  CREATININE 3.42* 3.38* 3.25*  GLUCOSE 98 109* 128*   Electrolytes  Recent Labs Lab 09/21/14 0250 09/21/14 0500 09/22/14 0221  CALCIUM 8.4* 8.6* 8.8*  MG 1.8 2.0  --   PHOS 5.9*  --   --    Sepsis Markers  Recent Labs Lab 09/20/14 0955 09/20/14 1011 09/20/14 1500 09/20/14 2022 09/21/14 0250 09/22/14 0221  LATICACIDVEN  --  1.71 1.5 1.1  --   --   PROCALCITON 6.67  --   --   --  6.13 3.91   ABG  Recent Labs Lab 09/20/14 0652  PHART 7.260*  PCO2ART 36.6  PO2ART 60.0*   Liver Enzymes  Recent Labs Lab 09/20/14 0655 09/21/14 0250  AST 39  --   ALT 43  --   ALKPHOS 71  --   BILITOT 0.7  --   ALBUMIN 2.7* 2.4*   Cardiac Enzymes  Recent Labs Lab 09/20/14 1500 09/20/14 2021 09/21/14 0250  TROPONINI 0.27* 0.29* 0.30*   Glucose  Recent Labs Lab 09/21/14 0802 09/21/14 1149 09/21/14 1605 09/21/14 1945 09/22/14 0002 09/22/14 0757  GLUCAP 101* 107* 123* 139* 113* 135*    Imaging No results found.   ASSESSMENT /  PLAN:  PULMONARY OETT A: Acute Hypoxic Respiratory Failure - in setting of diffuse bilateral infiltrate, PNA vs CHF  P:   NIMV as needed for increased work of breathing, allow for break 9/11 Intermittent CXR  Pulmonary hygiene: IS, mobilize  BP control  Lasix as below   CARDIOVASCULAR CVL A:  ? CHF exacerbation - hx of, diffuse bilateral infiltrates HTN  R/O MI - negative troponin  P:  Lasix 40 BID x 3 doses (initiated on 9/10), then 40 daily for gentle diuresis  Monitor sr cr trend closely Trend BNP  Tele monitoring  Ensure adequate BP / rate control  ASA   RENAL A:   Acute on Chronic Renal Failure P:   Foley with strict I/O's  Renal US consistent with medical renal disease, no hydronephrosis Lasix as above Trend BMP  Replace electrolytes as indicated   GASTROINTESTINAL A:   Hx Colon CA s/p colectomy with colostomy & G-Tube P:    NPO  G-tube for TF / meds Begin TF 9/11 via PEG PPI PEG care BID & PRN   HEMATOLOGIC A:  Anemia of chronic disease  P:  Trend CBC Heparin for DVT prophylaxis   INFECTIOUS A:  Diffuse bilateral infiltrates - edema vs CHF, r/o infectious etiology. On Augmentin (9/8) prior to admit SNF Resident  P:   BCx2 9/9 >> UC 9/9 >> multiple species present UC 9/10 >>  RVP 9/9 >>   Abx:  9/9 vanc >>  9/9 Fortaz >>  9/9 Levaquin >> 9/11  Trend PCT for abx duration  Consider narrow abx further 9/12  ENDOCRINE A:   DM   P:   SSI  NEUROLOGIC A:   Advanced Dementia Hx of strokes P:   RASS goal:0 Avoid sedation Resume zoloft, aricept, reduced dose seroquel 9/11   FAMILY  - Updates: No family at bedside  - Inter-disciplinary family meet or Palliative Care meeting due by:  day [place], NP-C [facility] Pgr: [phone] or if no answer [phone] 09/22/2014, 9:02 AM

## 2014-09-22 NOTE — Progress Notes (Signed)
Subjective:  Did not like BiPap. Now on FM o2. Not very talkative. Appears comfortable  Objective:  Vital Signs in the last 24 hours: Temp:  [98.4 F (36.9 C)-99.6 F (37.6 C)] 98.5 F (36.9 C) (09/11 0755) Pulse Rate:  [36-134] 58 (09/11 1000) Resp:  [18-37] 29 (09/11 1000) BP: (120-176)/(44-88) 149/77 mmHg (09/11 1000) SpO2:  [97 %-100 %] 100 % (09/11 1000) FiO2 (%):  [40 %-60 %] 50 % (09/11 0400) Weight:  [231 lb 14.8 oz (105.2 kg)] 231 lb 14.8 oz (105.2 kg) (09/11 0500)  Intake/Output from previous day: 09/10 0701 - 09/11 0700 In: 260 [I.V.:210; IV Piggyback:50] Out: 2745 [Urine:2370; Stool:375]   Physical Exam: General: FM O2. in no acute distress. Head:  Normocephalic and atraumatic. Lungs: Mild crackles bases B. Heart: Normal S1 and S2.  No murmur, rubs or gallops.  Abdomen: soft, non-tender, positive bowel sounds. Extremities: No clubbing or cyanosis. 1+ LE B edema. Neurologic: Alert     Lab Results:  Recent Labs  09/21/14 0250 09/22/14 0221  WBC 10.6* 8.4  HGB 9.7* 10.5*  PLT 187 222    Recent Labs  09/21/14 0500 09/22/14 0221  NA 139 146*  K 3.7 3.7  CL 108 109  CO2 20* 24  GLUCOSE 109* 128*  BUN 62* 68*  CREATININE 3.38* 3.25*    Recent Labs  09/20/14 2021 09/21/14 0250  TROPONINI 0.29* 0.30*   Hepatic Function Panel  Recent Labs  09/20/14 0655 09/21/14 0250  PROT 7.7  --   ALBUMIN 2.7* 2.4*  AST 39  --   ALT 43  --   ALKPHOS 71  --   BILITOT 0.7  --    No results for input(s): CHOL in the last 72 hours. No results for input(s): PROTIME in the last 72 hours.  Imaging: US Renal  09/20/2014   CLINICAL DATA:  Acute on chronic renal failure.  EXAM: RENAL / URINARY TRACT ULTRASOUND COMPLETE  COMPARISON:  06/14/2012  FINDINGS: Right Kidney:  Length: 11.3 cm. Diffuse parenchymal atrophy with diffusely increased parenchymal echotexture suggesting medical renal disease. Mild scarring. Focal prominence in the upper mid pole  probably representing prominent column of Bertin rather than cyst. No hydronephrosis.  Left Kidney:  Length: 11.2 cm. Diffuse parenchymal atrophy with increased parenchymal echotexture suggesting chronic medical renal disease. Mild scarring. No hydronephrosis.  Bladder:  Limited visualization of the bladder. Poor penetration. No discrete filling defect or wall thickening appreciated as visualized.  IMPRESSION: Diffuse parenchymal atrophy and increased parenchymal echotexture in both kidneys consistent with chronic medical renal disease. No hydronephrosis.   Electronically Signed   By: Lucienne Capers M.D.   On: 09/20/2014 23:05   Dg Chest Port 1 View  09/22/2014   CLINICAL DATA:  69 year old male with a history of pulmonary edema  EXAM: PORTABLE CHEST - 1 VIEW  COMPARISON:  09/20/2014, 07/13/2012  FINDINGS: Cardiomediastinal silhouette unchanged. Atherosclerotic calcifications aortic arch.  Improving bilateral airspace opacities.  No pneumothorax.  No large pleural effusion.  IMPRESSION: Improving bilateral airspace opacities.  Signed,  Dulcy Fanny. Earleen Newport, DO  [facility]  [facility]   Electronically Signed   By: Corrie Mckusick D.O.   On: 09/22/2014 09:15   Personally viewed.   Telemetry: No adverse rhythms Personally viewed.   EKG:  Junctional rhythm with rate in 50's. Left axis deviation. Inverted T-waves in inferior and lateral leads.  Cardiac Studies:   ECHO Study Conclusions - Left ventricle: The cavity size  was normal. Wall thickness was increased in a pattern of mild LVH. Systolic function was normal. The estimated ejection fraction was in the range of 55% to 60%. Wall motion was normal; there were no regional wall motion abnormalities. Doppler parameters are consistent with abnormal left ventricular relaxation (grade 1 diastolic dysfunction). The E/e&' ratio is >15, suggesting elevated LV filling pressure. - Aortic valve:  Trileaflet. Sclerosis without stenosis. There was no regurgitation. - Mitral valve: Mildly thickened leaflets . There was no significant regurgitation. - Left atrium: The atrium was at the upper limits of normal in size. - Right ventricle: The cavity size was mildly dilated. - Right atrium: The atrium was mildly dilated. Central venous pressure (est): 15 mm Hg (in the setting of positive pressure ventilation) - Atrial septum: No defect or patent foramen ovale was identified. - Tricuspid valve: There was moderate regurgitation. - Pulmonary arteries: PA peak pressure: 55 mm Hg (S) + RAP. - Inferior vena cava: The vessel was dilated. The respirophasic diameter changes were blunted (< 50%), consistent with elevated central venous pressure.   Impressions: - LVEF 55-60%, mild LVH, diastolic dysfunction with elevated LV filling pressure, mild RAE, moderate TR with RVSP of 55 mmHG + RAP, dilated IVC but in the setting of PPV.  Scheduled Meds: . antiseptic oral rinse  7 mL Mouth Rinse q12n4p  . aspirin  81 mg Per Tube Daily  . cefTAZidime (FORTAZ)  IV  2 g Intravenous Q24H  . chlorhexidine  15 mL Mouth Rinse BID  . Chlorhexidine Gluconate Cloth  6 each Topical Q0600  . donepezil  10 mg Per Tube QHS  . feeding supplement (VITAL HIGH PROTEIN)  1,000 mL Per Tube Q24H  . furosemide  40 mg Intravenous Daily  . heparin  5,000 Units Subcutaneous 3 times per day  . insulin aspart  0-9 Units Subcutaneous 6 times per day  . mupirocin ointment  1 application Nasal BID  . pantoprazole (PROTONIX) IV  40 mg Intravenous Q24H  . QUEtiapine  25 mg Per Tube QHS  . sertraline  150 mg Per Tube Daily  . vancomycin  1,500 mg Intravenous Q48H   Continuous Infusions:  PRN Meds:.sodium chloride   Assessment/Plan:  Active Problems:   Alzheimer's disease   Chronic kidney disease   Hyperlipidemia   Acute on chronic diastolic congestive heart failure   Elevated troponin   Essential  hypertension   Acute renal failure superimposed on stage 3 chronic kidney disease   Hyperkalemia   Congestive heart disease  69 year old with the following issues:  1. Acute on Chronic Diastolic Heart Failure - BNP elevated to 1077.8 on admission. CXR showing pulmonary edema, cardiac enlargement and mild atelectasis. - was on Lasix 20mg  daily PTA. - last echocardiogram in our record was in 07/2007 which showed EF of 55 to 60% but left ventricular wall thickness was at the upper limits of normal. - Echo on 09/20/2014 showed LVEF 55-60%, mild LVH, diastolic dysfunction with elevated LV filling pressure, mild RAE, moderate TR with RVSP of 55 mmHG +RAP, dilated IVC but in the setting of PPV and Grade 1 Diastolic Dysfunction. -  Lasix  IV  Net Output -7.0 L. Excellent response.  - BUN increased mildly from yesterday.  - Lets continue IV lasix for now, one more day, then change to PO via PEG. Watch renal fnx closely.      2. Elevated Troponin - elevated to 0.29-0.3. Likely represents demand ischemia. Cyclic troponins. - last cath in 2009  showing LAD with 15-20% proximal and mid stenosis, left circumflex with 5-10% proximal stenosis, RCA with 20-25% mid stenosis. - EKG showed Junctional rhythm with inverted T-waves in inferior and lateral leads. However, inverted T-waves were noted on previous EKG in 2014. - now NSR on Tele. Atrial bigem - patient remains without chest pain at this time  3. Acute on Chronic Renal Failure - baseline of 1.44. Elevated to 3.74 on admission. - will attempt to avoid nephrotoxic medications - continue lasix IV. Mild improvement in creat. Now 3.38  4. HTN - BP has been 107/70 - 181/71 while admitted. - add back home medical regimen as tolerated. - continue diuresis   5. HLD - continue statin therapy  6. Hyperkalemia - Potassium elevated to 5.8 on admission. - per admitting team - now 3.7  7. Hypoxic Respiratory Failure - thought to be secondary to  pneumonia (recently diagnosed on 09/19/2014). - Currently on Vanc, Ceftazidie, and Levaquin - per admitting team  8. Alzheimer's Disease - patient able to provided very limited history.  9. Type 2 DM - per admitting team  Zakyah Yanes, Elta [place] 09/22/2014, 10:46 AM

## 2014-09-22 NOTE — Progress Notes (Signed)
Pt is currently not wearing BIPAP. Wearing V/M 35%. Vitals: HR: 69, RR: 26, Sats: 98%. Pt is comfortable: no SOB, WOB normal. RT will monitor.

## 2014-09-23 ENCOUNTER — Inpatient Hospital Stay
Admission: AD | Admit: 2014-09-23 | Discharge: 2014-10-11 | Disposition: A | Discharge status: Skilled Nursing Facility | Payer: Medicare Other | Source: Ambulatory Visit | Attending: Internal Medicine | Admitting: Internal Medicine

## 2014-09-23 ENCOUNTER — Other Ambulatory Visit (HOSPITAL_COMMUNITY): Payer: Medicare Other

## 2014-09-23 DIAGNOSIS — J189 Pneumonia, unspecified organism: Secondary | ICD-10-CM

## 2014-09-23 LAB — GLUCOSE, CAPILLARY
GLUCOSE-CAPILLARY: 142 mg/dL — AB (ref 65–99)
Glucose-Capillary: 123 mg/dL — ABNORMAL HIGH (ref 65–99)
Glucose-Capillary: 155 mg/dL — ABNORMAL HIGH (ref 65–99)
Glucose-Capillary: 145 mg/dL — ABNORMAL HIGH (ref 65–99)
Glucose-Capillary: 135 mg/dL — ABNORMAL HIGH (ref 65–99)

## 2014-09-23 LAB — BASIC METABOLIC PANEL
ANION GAP: 12 (ref 5–15)
BUN: 85 mg/dL — ABNORMAL HIGH (ref 6–20)
CHLORIDE: 112 mmol/L — AB (ref 101–111)
CO2: 25 mmol/L (ref 22–32)
Calcium: 9 mg/dL (ref 8.9–10.3)
Creatinine, Ser: 2.85 mg/dL — ABNORMAL HIGH (ref 0.61–1.24)
GFR calc non Af Amer: 21 mL/min — ABNORMAL LOW (ref >60)
GFR, EST AFRICAN AMERICAN: 24 mL/min — AB (ref >60)
Glucose, Bld: 147 mg/dL — ABNORMAL HIGH (ref 65–99)
POTASSIUM: 3.1 mmol/L — AB (ref 3.5–5.1)
SODIUM: 149 mmol/L — AB (ref 135–145)

## 2014-09-23 LAB — LEGIONELLA ANTIGEN, URINE

## 2014-09-23 LAB — TROPONIN I: TROPONIN I: 0.11 ng/mL — AB (ref <0.031)

## 2014-09-23 LAB — CK TOTAL AND CKMB (NOT AT ARMC)
CK, MB: 6.1 ng/mL — ABNORMAL HIGH (ref 0.5–5.0)
Relative Index: INVALID (ref 0.0–2.5)
Total CK: 76 U/L (ref 49–397)

## 2014-09-23 MED ORDER — VANCOMYCIN HCL 10 G IV SOLR: 1500.0000 mg | INTRAVENOUS | Status: DC

## 2014-09-23 MED ORDER — MUPIROCIN 2 % EX OINT: 1.0000 "application " | TOPICAL_OINTMENT | Freq: Two times a day (BID) | CUTANEOUS | Status: AC

## 2014-09-23 MED ORDER — QUETIAPINE FUMARATE 25 MG PO TABS: 25.0000 mg | ORAL_TABLET | Freq: Every day | ORAL | Status: DC

## 2014-09-23 MED ORDER — PANTOPRAZOLE SODIUM 40 MG IV SOLR: 40.0000 mg | INTRAVENOUS | Status: DC

## 2014-09-23 MED ORDER — VITAL HIGH PROTEIN PO LIQD: ORAL | Status: DC

## 2014-09-23 MED ORDER — CETYLPYRIDINIUM CHLORIDE 0.05 % MT LIQD: 7.0000 mL | Freq: Two times a day (BID) | OROMUCOSAL | Status: DC

## 2014-09-23 MED ORDER — CETYLPYRIDINIUM CHLORIDE 0.05 % MT LIQD
7.0000 mL | Freq: Two times a day (BID) | OROMUCOSAL | Status: DC
  Administered 2014-09-23: 7 mL via OROMUCOSAL

## 2014-09-23 MED ORDER — CHLORHEXIDINE GLUCONATE CLOTH 2 % EX PADS: 6.0000 | MEDICATED_PAD | Freq: Every day | CUTANEOUS | Status: AC

## 2014-09-23 MED ORDER — INSULIN ASPART 100 UNIT/ML ~~LOC~~ SOLN: 0.0000 [IU] | SUBCUTANEOUS | Status: DC

## 2014-09-23 MED ORDER — HEPARIN SODIUM (PORCINE) 5000 UNIT/ML IJ SOLN: 5000.0000 [IU] | Freq: Three times a day (TID) | INTRAMUSCULAR | Status: DC

## 2014-09-23 MED ORDER — FUROSEMIDE 20 MG PO TABS: 40.0000 mg | ORAL_TABLET | Freq: Every day | ORAL | Status: DC

## 2014-09-23 MED ORDER — DEXTROSE 5 % IV SOLN: 2.0000 g | INTRAVENOUS | Status: DC

## 2014-09-23 MED ORDER — IOHEXOL 300 MG/ML  SOLN
50.0000 mL | Freq: Once | INTRAMUSCULAR | Status: DC | PRN
  Administered 2014-09-23: 50 mL via ORAL

## 2014-09-23 NOTE — Progress Notes (Signed)
ANTIBIOTIC CONSULT NOTE - FOLLOW UP  Pharmacy Consult for Vancomycin/Ceftazidime  Indication: rule out pneumonia  No Known Allergies  Patient Measurements: Height: 5\' 10"  (177.8 cm) Weight: 234 lb 9.1 oz (106.4 kg) IBW/kg (Calculated) : 73   Vital Signs: Temp: 97.9 F (36.6 C) (09/12 1151) Temp Source: Oral (09/12 1151) BP: 127/53 mmHg (09/12 1300) Pulse Rate: 58 (09/12 1300) Intake/Output from previous day: 09/11 0701 - 09/12 0700 In: 1480 [I.V.:20; NG/GT:760; IV Piggyback:550] Out: 2825 [Urine:2000; Stool:825] Intake/Output from this shift: Total I/O In: 360 [Other:30; NG/GT:280; IV Piggyback:50] Out: 1125 [Urine:1125]  Labs:  Recent Labs  09/21/14 0250 09/21/14 0500 09/22/14 0221  WBC 10.6*  --  8.4  HGB 9.7*  --  10.5*  PLT 187  --  222  CREATININE 3.42* 3.38* 3.25*   Estimated Creatinine Clearance: 26.2 mL/min (by C-G formula based on Cr of 3.25). No results for input(s): VANCOTROUGH, VANCOPEAK, VANCORANDOM, GENTTROUGH, GENTPEAK, GENTRANDOM, TOBRATROUGH, TOBRAPEAK, TOBRARND, AMIKACINPEAK, AMIKACINTROU, AMIKACIN in the last 72 hours.   Microbiology: Recent Results (from the past 720 hour(s))  Blood Culture (routine x 2)     Status: None (Preliminary result)   Collection Time: 09/20/14  6:46 AM  Result Value Ref Range Status   Specimen Description BLOOD LEFT ARM  Final   Special Requests BOTTLES DRAWN AEROBIC AND ANAEROBIC 10CC  Final   Culture NO GROWTH 3 DAYS  Final   Report Status PENDING  Incomplete  Blood Culture (routine x 2)     Status: None (Preliminary result)   Collection Time: 09/20/14  6:55 AM  Result Value Ref Range Status   Specimen Description BLOOD LEFT HAND  Final   Special Requests BOTTLES DRAWN AEROBIC AND ANAEROBIC 10CC  Final   Culture NO GROWTH 3 DAYS  Final   Report Status PENDING  Incomplete  Urine culture     Status: None   Collection Time: 09/20/14  7:49 AM  Result Value Ref Range Status   Specimen Description URINE, RANDOM   Final   Special Requests NONE  Final   Culture MULTIPLE SPECIES PRESENT, SUGGEST RECOLLECTION  Final   Report Status 09/21/2014 FINAL  Final  MRSA PCR Screening     Status: Abnormal   Collection Time: 09/20/14 11:40 AM  Result Value Ref Range Status   MRSA by PCR POSITIVE (A) NEGATIVE Final    Comment:        The GeneXpert MRSA Assay (FDA approved for NASAL specimens only), is one component of a comprehensive MRSA colonization surveillance program. It is not intended to diagnose MRSA infection nor to guide or monitor treatment for MRSA infections. RESULT CALLED TO, READ BACK BY AND VERIFIED WITH: Junious Silk RN 14:40 09/20/14 ([email or web address])   Culture, Urine     Status: None   Collection Time: 09/21/14  6:35 PM  Result Value Ref Range Status   Specimen Description URINE, CATHETERIZED  Final   Special Requests NONE  Final   Culture NO GROWTH 1 DAY  Final   Report Status 09/22/2014 FINAL  Final    Medical History: Past Medical History  Diagnosis Date  . Hyponatremia   . Hypokalemia   . Anemia   . Diabetes mellitus without complication   . Hypertension   . UTI (lower urinary tract infection)   . Delusional disorder   . HOH (hard of hearing)   . Hyperlipidemia   . CHF (congestive heart failure)   . Renal insufficiency   . Stroke   . Dementia   .  Depression   . Dysphagia   . Falls frequently   . Colon cancer 2009    s/p Colectomy  . Alzheimer's dementia     Medications:  Anti-infectives    Start     Dose/Rate Route Frequency Ordered Stop   09/22/14 1000  levofloxacin (LEVAQUIN) IVPB 500 mg  Status:  Discontinued     500 mg 100 mL/hr over 60 Minutes Intravenous Every 48 hours 09/20/14 0837 09/22/14 0912   09/22/14 0800  vancomycin (VANCOCIN) 1,500 mg in sodium chloride 0.9 % 500 mL IVPB     1,500 mg 250 mL/hr over 120 Minutes Intravenous Every 48 hours 09/20/14 0940 09/27/14 2359   09/20/14 1000  cefTAZidime (FORTAZ) 2 g in dextrose 5 % 50 mL IVPB     2 g 100 mL/hr  over 30 Minutes Intravenous Every 24 hours 09/20/14 0940 09/27/14 2359   09/20/14 0815  levofloxacin (LEVAQUIN) IVPB 750 mg     750 mg 100 mL/hr over 90 Minutes Intravenous  Once 09/20/14 0803 09/20/14 1459   09/20/14 0700  vancomycin (VANCOCIN) IVPB 1000 mg/200 mL premix  Status:  Discontinued     1,000 mg 200 mL/hr over 60 Minutes Intravenous  Once 09/20/14 0646 09/20/14 0651   09/20/14 0700  piperacillin-tazobactam (ZOSYN) IVPB 3.375 g  Status:  Discontinued     3.375 g 100 mL/hr over 30 Minutes Intravenous 3 times per day 09/20/14 0646 09/20/14 1149   09/20/14 0700  vancomycin (VANCOCIN) 2,000 mg in sodium chloride 0.9 % 500 mL IVPB     2,000 mg 250 mL/hr over 120 Minutes Intravenous  Once 09/20/14 0651 09/20/14 0943     Assessment: 45 yom presented to the ED with respiratory distress. Currently on D#4 of IV Vancomycin and ceftazidime for possible HCAP. Pt is afebrile and WBC has normalized. Scr was elevated at 3.25 on 9/11. LA is 1.51.  LVanc 9/9 >>(9/16) Ursula Alert 9/9 >>(9/16) LVQ 9/9 >> 9/11 Augmentin PTA  9/9 Strep pneumo Ag - negative 9/9 MRSA PCR - positive 9/9 RVP -  9/9 BCx x2 - NGTD 9/9 UCx - negative  Goal of Therapy:  Eradication of infection Vancomycin trough 15-20 mcg/mL    Plan: - Vanc 1500mg  IV Q48H - Ceftazidime 2gm IV Q24H - Given 7 days of total therapy per CCM  - F/u renal fxn, C&S, clinical status and trough at [place], PharmD., BCPS Clinical Pharmacist Pager [phone]

## 2014-09-23 NOTE — Discharge Summary (Signed)
Physician Discharge Summary  Patient ID: [name] MRN: [id number] DOB/AGE: 05-14-1945 69 y.o.  Admit date: 09/20/2014 Discharge date: 09/23/2014    Discharge Diagnoses:  Acute Hypoxic Respiratory Failure  Diffuse Bilateral Infiltrates - CHF / PNA (HCAP) CHF Exacerbation  HTN  Acute on Chronic Renal Failure Hx Colon Cancer s/p Colectomy with Colostomy & G-Tube  Anemia of Chronic Disease  Diabetes Mellitus  Advanced Dementia  Hx of CVA                                                                      DISCHARGE PLAN BY DIAGNOSIS     Acute Hypoxic Respiratory Failure - in setting of diffuse bilateral infiltrate, PNA vs CHF   Discharge Plan: Wean O2 as tolerated for saturations > 92% Intermittent CXR  Pulmonary hygiene: IS, mobilize  BP control  Lasix as below    CHF exacerbation - hx of, diffuse bilateral infiltrates HTN  R/O MI - negative troponin   Discharge Plan:  Continue lasix 40 mg qd, change to PT 9/12 Monitor sr cr trend closely Trend BNP  Tele monitoring  Ensure adequate BP / rate control  ASA  Resume hydralazine, hold norvasc.  Resume home medications as tolerated.     Acute on Chronic Renal Failure  Discharge Plan:  Foley with strict I/O's  Renal US consistent with medical renal disease, no hydronephrosis Lasix as above Trend BMP  Replace electrolytes as indicated    Hx Colon CA s/p colectomy with colostomy & G-Tube  Discharge Plan: NPO  G-tube for TF / meds TF via PEG @ 40 ml/hr PPI  PEG care BID & PRN    Anemia of chronic disease   Discharge Plan: Trend CBC Heparin for DVT prophylaxis    Diffuse bilateral infiltrates - edema vs CHF, r/o infectious etiology. On Augmentin (9/8) prior to admit SNF Resident   Discharge Plan:  Abx:  9/9 vanc >> (complete 7 days, anticipate d/c after 9/15 dose) >>  9/9 Tressie Ellis >>  (complete 7 days, anticipate d/c after 9/15 dose) >> 9/9 Levaquin >> 9/11  Pct is  improving. Continue to monitor.  Give total 7 days therapy. Follow up cultures as above   DM   Discharge Plan:  SSI Hold home insulin  Advanced Dementia Hx of strokes  Discharge Plan:  Avoid sedation as able  Resume zoloft, aricept, reduced dose seroquel 9/11                    DISCHARGE SUMMARY   [name] is a 69 y.o. y/o male, SNF Resident, with a PMH of DM II, HTN, frequent UTI's / incontinence, Hard of Hearing, CKD (baseline sr cr 1.44), CVA, Dementia, Depression, Dysphagia s/p PEG, Colon Cancer s/p Colectomy and CHF who presented to [facility] on [facility] with reports of hypoxemia at SNF.   The patient resides at [facility] and was noted to have saturations in the 70's on RA. EMS was activated and patient was placed on Bipap.  ER work up noted the patient to have an elevated BNP (>1000), troponin of 0.29, lactic acid 1.71, K 5.8 and serum cr of 3.74 (up from baseline).  The patient was started on augmentin 9/8 for  PNA at the SNF.  CXR evaluation showed bilateral pulmonary infiltrates concerning for edema vs PNA.  He was initially treated for sepsis with volume resuscitation but was stopped when CXR showed volume overload.    The patient was admitted to ICU for bipap support.  He was treated with empiric antibiotics for possible PNA.  He was cycled on/off NIMV.  Cardiology evaluated the patient and felt the patient had decompensated diastolic CHF.   Troponin was trended and elevation felt to be related to demand ischemia.  Pan cultures were assessed and negative to date.  Antibiotics were narrowed as cultures returned.  Diuresis was increased during ICU stay with negative balance (-8.9L since admit).  Renal US demonstrated medical renal disease and no evidence of hydronephrosis.  CXR cleared and respiratory symptoms improved with diuresis.  The patient was felt to have ongoing PT and respiratory needs (potential need for bipap) and was evaluated for LTAC  transfer.  He was medically cleared for discharge 9/12 with plans as above.               STUDIES:  9/09 Renal US >> findings consistent with medial renal disease, no hydronephrosis   SIGNIFICANT EVENTS: 9/09 Admit with acute hypoxic respiratory failure, concern for CHF vs PNA.   MICRO DATA  BCx2 9/9 >> UC 9/9 >> multiple species present UC 9/10 >> neg  RVP 9/9 >>   ANTIBIOTICS 9/9 Vanco >>  9/9 Fortaz >>  9/9 Levaquin >> 9/11  CONSULTS Cardiology    Discharge Exam: General: Elderly male, confused, no distress Neuro: No focal deficits HEENT: Moist mucus membranes Cardiovascular: S1, S2, RRR Lungs: Clear antr Abdomen: Colostomy/peg in place Musculoskeletal: intact Skin: 1+ Edema.  Filed Vitals:   09/23/14 1151 09/23/14 1200 09/23/14 1300 09/23/14 1540  BP:  140/56 127/53   Pulse:  58 58   Temp: 97.9 F (36.6 C)   97.6 F (36.4 C)  TempSrc: Oral   Oral  Resp:  19 27   Height:      Weight:      SpO2:  100% 97%      Discharge Labs  BMET  Recent Labs Lab 09/20/14 0655 09/20/14 1500 09/21/14 0250 09/21/14 0500 09/22/14 0221  NA 130* 133* 140 139 146*  K 5.8* 4.6 3.8 3.7 3.7  CL 102 105 109 108 109  CO2 17* 18* 16* 20* 24  GLUCOSE 163* 204* 98 109* 128*  BUN 62* 60* 59* 62* 68*  CREATININE 3.74* 3.52* 3.42* 3.38* 3.25*  CALCIUM 9.3 8.7* 8.4* 8.6* 8.8*  MG  --   --  1.8 2.0  --   PHOS  --   --  5.9*  --   --    CBC  Recent Labs Lab 09/20/14 0655 09/21/14 0250 09/22/14 0221  HGB 10.5* 9.7* 10.5*  HCT 31.2* 28.6* 30.0*  WBC 12.9* 10.6* 8.4  PLT 196 187 222        Discharge Instructions    (HEART FAILURE PATIENTS) Call MD:  Anytime you have any of the following symptoms: 1) 3 pound weight gain in 24 hours or 5 pounds in 1 week 2) shortness of breath, with or without a dry hacking cough 3) swelling in the hands, feet or stomach 4) if you have to sleep on extra pillows at night in order to breathe.    Complete by:  As directed       Activity as tolerated - No restrictions    Complete by:  As  directed      Call MD for:  difficulty breathing, headache or visual disturbances    Complete by:  As directed      Call MD for:  extreme fatigue    Complete by:  As directed      Call MD for:  hives    Complete by:  As directed      Call MD for:  persistant dizziness or light-headedness    Complete by:  As directed      Call MD for:  persistant nausea and vomiting    Complete by:  As directed      Call MD for:  redness, tenderness, or signs of infection (pain, swelling, redness, odor or green/yellow discharge around incision site)    Complete by:  As directed      Call MD for:  severe uncontrolled pain    Complete by:  As directed      Call MD for:  temperature >100.4    Complete by:  As directed               Medication List    STOP taking these medications        acetaminophen 500 MG tablet  Commonly known as:  TYLENOL     amLODipine 10 MG tablet  Commonly known as:  NORVASC     amoxicillin-clavulanate 875-125 MG per tablet  Commonly known as:  AUGMENTIN     feeding supplement (PRO-STAT SUGAR FREE 64) Liqd     free water Soln     insulin detemir 100 UNIT/ML injection  Commonly known as:  LEVEMIR     insulin lispro 100 UNIT/ML injection  Commonly known as:  HUMALOG     ipratropium-albuterol 0.5-2.5 (3) MG/3ML Soln  Commonly known as:  DUONEB     potassium chloride 2 mEq/mL Soln oral liquid  Commonly known as:  KCl     potassium chloride 20 MEQ/15ML (10%) Soln     traZODone 50 MG tablet  Commonly known as:  DESYREL      TAKE these medications        antiseptic oral rinse 0.05 % Liqd solution  Commonly known as:  CPC / CETYLPYRIDINIUM CHLORIDE 0.05%  7 mLs by Mouth Rinse route 2 (two) times daily.     aspirin 81 MG chewable tablet  Give 81 mg by tube daily.     atorvastatin 20 MG tablet  Commonly known as:  LIPITOR  Give 20 mg by tube daily.     cefTAZidime 2 g in dextrose 5 % 50 mL   Inject 2 g into the vein daily.     CERTAGEN PO  Give 1 tablet by tube daily.     Chlorhexidine Gluconate Cloth 2 % Pads  Apply 6 each topically daily at 6 (six) AM.     cyanocobalamin 1000 MCG/ML injection  Commonly known as:  (VITAMIN B-12)  Inject 1,000 mcg into the muscle every 30 (thirty) days.     donepezil 10 MG tablet  Commonly known as:  ARICEPT  Give 10 mg by tube at bedtime.     feeding supplement (VITAL HIGH PROTEIN) Liqd liquid  TF at 40 ml/hr     ferrous sulfate 220 (44 FE) MG/5ML solution  Place 330 mg into feeding tube daily.     furosemide 20 MG tablet  Commonly known as:  LASIX  Place 2 tablets (40 mg total) into feeding tube daily.     heparin 5000 UNIT/ML injection  Inject 1 mL (5,000 Units total) into the skin every 8 (eight) hours.     hydrALAZINE 10 MG tablet  Commonly known as:  APRESOLINE  Take 15 mg by mouth 4 (four) times daily.     insulin aspart 100 UNIT/ML injection  Commonly known as:  novoLOG  Inject 0-9 Units into the skin every 4 (four) hours. CBG < 70: implement hypoglycemia protocol CBG 70 - 120: 0 units CBG 121 - 150: 1 unit CBG 151 - 200: 2 units CBG 201 - 250: 3 units CBG 251 - 300: 5 units CBG 301 - 350: 7 units CBG 351 - 400: 9 units CBG > 400: call MD and obtain STAT lab verification     mupirocin ointment 2 %  Commonly known as:  BACTROBAN  Place 1 application into the nose 2 (two) times daily.     pantoprazole 40 MG injection  Commonly known as:  PROTONIX  Inject 40 mg into the vein daily.     QUEtiapine 25 MG tablet  Commonly known as:  SEROQUEL  Place 1 tablet (25 mg total) into feeding tube at bedtime.     sennosides 8.8 MG/5ML syrup  Commonly known as:  SENOKOT  Place 15 mLs into feeding tube daily.     sertraline 100 MG tablet  Commonly known as:  ZOLOFT  Take 150 mg by mouth daily.     vancomycin 1,500 mg in sodium chloride 0.9 % 500 mL  Inject 1,500 mg into the vein every other day.     vitamin C 500 MG  tablet  Commonly known as:  ASCORBIC ACID  Give 1,000 mg by tube 2 (two) times daily.          Disposition: [place]   Discharged Condition: [name] has met maximum benefit of inpatient care and is medically stable and cleared for discharge.  Patient is pending follow up as above.      Time spent on disposition:  Greater than 35 minutes.   Signed: Noe Gens, NP-C [facility] Pgr: [phone] Office: [phone]

## 2014-09-23 NOTE — Progress Notes (Signed)
Patient Name: ALOYSIUS [name] Date of Encounter: 09/23/2014  Active Problems:   Alzheimer's disease   Chronic kidney disease   Hyperlipidemia   Acute on chronic diastolic congestive heart failure   Elevated troponin   Essential hypertension   Acute renal failure superimposed on stage 3 chronic kidney disease   Hyperkalemia   Congestive heart disease     Primary Cardiologist: [facility] - Dr. Debara Pickett Patient Profile: 69 y.o. male with PMH of Alzheimer's Disease, Chronic Diastolic CHF, Type II Diabetes Mellitus, HTN, HLD, Colon Cancer (s/p colectomy 2009, has PEG tube and colostomy), strokes, and CKD Stage 3 admitted on 09/20/2014 for acute hypoxic respiratory failure. Cardiology consulted for helping manage Diastolic CHF.  SUBJECTIVE: Talking more now that he is off BiPAP. Still hard to understand what he is verbally saying. Denies any chest pain or shortness of breath at this time.  OBJECTIVE Filed Vitals:   09/23/14 0900 09/23/14 1000 09/23/14 1100 09/23/14 1151  BP: 119/52 127/56 131/54   Pulse: 49 58 48   Temp:    97.9 F (36.6 C)  TempSrc:    Oral  Resp: 20 28 30    Height:      Weight:      SpO2: 100% 99% 100%     Intake/Output Summary (Last 24 hours) at 09/23/14 1230 Last data filed at 09/23/14 1100  Gross per 24 hour  Intake   1120 ml  Output   2175 ml  Net  -1055 ml   Filed Weights   09/21/14 0500 09/22/14 0500 09/23/14 0414  Weight: 240 lb 1.3 oz (108.9 kg) 231 lb 14.8 oz (105.2 kg) 234 lb 9.1 oz (106.4 kg)    PHYSICAL EXAM General: African American male in no acute distress. Currently on Nasal Cannula.  Head: Normocephalic, atraumatic.  Neck: Supple without bruits, JVD not elevated Lungs:  Resp regular and unlabored, Rales at bases bilaterally. Heart: RRR, S1, S2, no S3, S4, or murmur; no rub. Abdomen: Soft, non-tender, non-distended with normoactive bowel sounds. Colostomy in place. Extremities: No clubbing or cyanosis, trace edema. Distal pedal pulses are  2+ bilaterally. Neuro: Alert and oriented X 3. Moves all extremities spontaneously. Psych: Normal affect.   LABS: CBC: Recent Labs  09/21/14 0250 09/22/14 0221  WBC 10.6* 8.4  NEUTROABS 8.9*  --   HGB 9.7* 10.5*  HCT 28.6* 30.0*  MCV 77.5* 75.4*  PLT 187 956   Basic Metabolic Panel: Recent Labs  09/21/14 0250 09/21/14 0500 09/22/14 0221  NA 140 139 146*  K 3.8 3.7 3.7  CL 109 108 109  CO2 16* 20* 24  GLUCOSE 98 109* 128*  BUN 59* 62* 68*  CREATININE 3.42* 3.38* 3.25*  CALCIUM 8.4* 8.6* 8.8*  MG 1.8 2.0  --   PHOS 5.9*  --   --    Liver Function Tests: Recent Labs  09/21/14 0250  ALBUMIN 2.4*   Cardiac Enzymes: Recent Labs  09/20/14 1500 09/20/14 2021 09/21/14 0250  TROPONINI 0.27* 0.29* 0.30*   No results for input(s): TROPIPOC in the last 72 hours. BNP:  B NATRIURETIC PEPTIDE  Date/Time Value Ref Range Status  09/22/2014 02:21 AM 589.5* 0.0 - 100.0 pg/mL Final  09/20/2014 06:55 AM 1077.8* 0.0 - 100.0 pg/mL Final    TELE:  Sinus rhythm with rate in 60's. Frequent PVC's.       ECHO: 09/20/2014 Study Conclusions - Left ventricle: The cavity size was normal. Wall thickness was increased in a pattern of mild LVH. Systolic  function was normal. The estimated ejection fraction was in the range of 55% to 60%. Wall motion was normal; there were no regional wall motion abnormalities. Doppler parameters are consistent with abnormal left ventricular relaxation (grade 1 diastolic dysfunction). The E/e&' ratio is >15, suggesting elevated LV filling pressure. - Aortic valve: Trileaflet. Sclerosis without stenosis. There was no regurgitation. - Mitral valve: Mildly thickened leaflets . There was no significant regurgitation. - Left atrium: The atrium was at the upper limits of normal in size. - Right ventricle: The cavity size was mildly dilated. - Right atrium: The atrium was mildly dilated. Central venous pressure (est): 15 mm Hg (in the  setting of positive pressure ventilation) - Atrial septum: No defect or patent foramen ovale was identified. - Tricuspid valve: There was moderate regurgitation. - Pulmonary arteries: PA peak pressure: 55 mm Hg (S) + RAP. - Inferior vena cava: The vessel was dilated. The respirophasic diameter changes were blunted (< 50%), consistent with elevated central venous pressure.  Impressions: - LVEF 55-60%, mild LVH, diastolic dysfunction with elevated LV filling pressure, mild RAE, moderate TR with RVSP of 55 mmHG + RAP, dilated IVC but in the setting of PPV.   Radiology/Studies: Dg Chest Port 1 View: 09/22/2014   CLINICAL DATA:  69 year old male with a history of pulmonary edema  EXAM: PORTABLE CHEST - 1 VIEW  COMPARISON:  09/20/2014, 07/13/2012  FINDINGS: Cardiomediastinal silhouette unchanged. Atherosclerotic calcifications aortic arch.  Improving bilateral airspace opacities.  No pneumothorax.  No large pleural effusion.  IMPRESSION: Improving bilateral airspace opacities.  Signed,  Dulcy Fanny. Earleen Newport, DO  [facility]  [facility]   Electronically Signed   By: Corrie Mckusick D.O.   On: 09/22/2014 09:15     Current Medications:  . antiseptic oral rinse  7 mL Mouth Rinse BID  . aspirin  81 mg Per Tube Daily  . cefTAZidime (FORTAZ)  IV  2 g Intravenous Q24H  . Chlorhexidine Gluconate Cloth  6 each Topical Q0600  . donepezil  10 mg Per Tube QHS  . feeding supplement (VITAL HIGH PROTEIN)  1,000 mL Per Tube Q24H  . furosemide  40 mg Intravenous Daily  . heparin  5,000 Units Subcutaneous 3 times per day  . insulin aspart  0-9 Units Subcutaneous 6 times per day  . mupirocin ointment  1 application Nasal BID  . pantoprazole (PROTONIX) IV  40 mg Intravenous Q24H  . QUEtiapine  25 mg Per Tube QHS  . sertraline  150 mg Per Tube Daily  . vancomycin  1,500 mg Intravenous Q48H      ASSESSMENT AND PLAN: 1. Acute on Chronic Diastolic Heart  Failure - BNP elevated to 1077.8 on adission. CXR showing pulmonary edema, cardiac enlargement and mild atelectasis. - was on Lasix 20mg  daily PTA. - last echocardiogram in our record was in 07/2007 which showed EF of 55 to 60% but left ventricular wall thickness was at the upper limits of normal. - Echo on 09/20/2014 showed LVEF 55-60%, mild LVH, diastolic dysfunction with elevated LV filling pressure, mild RAE, moderate TR with RVSP of 55 mmHG +RAP, dilated IVC but in the setting of PPV and Grade 1 Diastolic Dysfunction. - Net -8.9L since admission. Continue Lasix 40mg  IV daily. Patient is responding well to the medication with output of -2.8L yesterday.  2. Elevated Troponin - elevated to 0.29 in the ED. Likely represents demand ischemia. Cyclic troponins are pending. - last cath in 2009 showing LAD with 15-20% proximal and  mid stenosis, left circumflex with 5-10% proximal stenosis, RCA with 20-25% mid stenosis. - patient remains without chest pain or anginal equivalents at this time.  3. Acute on Chronic Renal Failure - baseline of 1.44. Elevated to 3.74 on admission. 3.25 on 09/22/2014. - will attempt to avoid nephrotoxic medications  4. HTN - BP has been 109/52 - 140/66 in the past 24 hours. - add back home medical regimen as tolerated.  5. HLD - continue statin therapy  6. Hyperkalemia - Potassium elevated to 5.8 on admission. - currently resolved.  7. Hypoxic Respiratory Failure - thought to be secondary to pneumonia (recently diagnosed on 09/19/2014). - Currently on Vanc and Ceftazidie - per admitting team  8. Alzheimer's Disease - patient able to provided very limited history.  9. Type 2 DM - per admitting team  Otherwise, per admitting team.  Signed, Erma Heritage , PA-C 12:30 PM 09/23/2014 Pager: [phone]

## 2014-09-23 NOTE — Progress Notes (Signed)
Report called to [facility] nurse. Patient is alert, Bp 140/61, HR 61, RR 23.

## 2014-09-23 NOTE — Progress Notes (Signed)
Initial Nutrition Assessment  DOCUMENTATION CODES:   Obesity unspecified  INTERVENTION:    Continue TF via PEG, recommend change TF to Vital AF 1.2 at 70 ml/h to provide 2016 kcals, 126 gm protein, 1362 ml free water daily.  NUTRITION DIAGNOSIS:   Inadequate oral intake related to inability to eat as evidenced by NPO status.  GOAL:   Patient will meet greater than or equal to 90% of their needs  MONITOR:   TF tolerance, Labs, Weight trends  REASON FOR ASSESSMENT:   Other (Comment) (TF)    ASSESSMENT:   69 y/o M with PMH of dementia admitted on 9/9 with acute hypoxic respiratory failure - CHF vs PNA.   Labs reviewed: sodium, BUN, creatinine elevated.  Discussed patient in ICU rounds today. Patient is a SNF resident. Unsure of usual TF regimen. TF was initiated on 9/11 via PEG; currently receiving Vital High Protein via PEG at 40 ml/h (960 ml/day) to provide 960 kcals, 84 gm protein, 803 ml free water daily.   Diet Order:  Diet NPO time specified  Skin:  Reviewed, no issues  Last BM:  9/12 (colostomy)  Height:   Ht Readings from Last 1 Encounters:  09/20/14 5\' 10"  (1.778 m)    Weight:   Wt Readings from Last 1 Encounters:  09/23/14 234 lb 9.1 oz (106.4 kg)    Ideal Body Weight:  75.5 kg  BMI:  Body mass index is 33.66 kg/(m^2).  Estimated Nutritional Needs:   Kcal:  1900-2100  Protein:  105-120 gm  Fluid:  2 L  EDUCATION NEEDS:   No education needs identified at this time  Molli Barrows, [place], [place], [place] Pager [phone] After Hours Pager [phone]

## 2014-09-23 NOTE — Care Management Note (Signed)
Case Management Note  Patient Details  Name: ADEKUNLE ROHRBACH MRN: [id number] Date of Birth: Nov 19, 1945  Subjective/Objective:       From SNF - [place].  May be Lt[facility] candidate.  Talked with brother, ok with Ltach option and then to SNF.  Would prefer [facility].  [facility] referral for [facility] placed.  Updated physician.               Action/Plan:   Expected Discharge Date:                  Expected Discharge Plan:  Long Term Acute Care (LTAC)  In-House Referral:     Discharge planning Services  CM Consult  Post Acute Care Choice:    Choice offered to:     DME Arranged:    DME Agency:     HH Arranged:    HH Agency:     Status of Service:  In process, will continue to follow  Medicare Important Message Given:    Date Medicare IM Given:    Medicare IM give by:    Date Additional Medicare IM Given:    Additional Medicare Important Message give by:     If discussed at [place] of Stay Meetings, dates discussed:    Additional Comments:  Vergie Living, RN 09/23/2014, 1:15 PM

## 2014-09-23 NOTE — Progress Notes (Signed)
PULMONARY / CRITICAL CARE MEDICINE   Name: [name] MRN: [id number] DOB: 1945/09/06    ADMISSION DATE:  09/20/2014  REFERRING MD :  EDP  CHIEF COMPLAINT:  SOB  INITIAL PRESENTATION: 69 y/o M with PMH of dementia admitted on 9/9 with acute hypoxic respiratory failure - CHF vs PNA.   STUDIES:  9/09 Renal US >> findings consistent with medial renal disease, no hydronephrosis   SIGNIFICANT EVENTS: 9/09  Admit with acute hypoxic respiratory failure, concern for CHF vs PNA.    SUBJECTIVE: Off Bipap for 24 hrs. Stable on RA.  VITAL SIGNS: Temp:  [97.9 F (36.6 C)-98.8 F (37.1 C)] 97.9 F (36.6 C) (09/12 1151) Pulse Rate:  [45-101] 58 (09/12 1200) Resp:  [15-34] 19 (09/12 1200) BP: (109-140)/(52-66) 140/56 mmHg (09/12 1200) SpO2:  [93 %-100 %] 100 % (09/12 1200) FiO2 (%):  [30 %-50 %] 30 % (09/12 0400) Weight:  [234 lb 9.1 oz (106.4 kg)] 234 lb 9.1 oz (106.4 kg) (09/12 0414)   HEMODYNAMICS:     VENTILATOR SETTINGS: Vent Mode:  [-]  FiO2 (%):  [30 %-50 %] 30 %   INTAKE / OUTPUT:  Intake/Output Summary (Last 24 hours) at 09/23/14 1343 Last data filed at 09/23/14 1200  Gross per 24 hour  Intake   1090 ml  Output   2400 ml  Net  -1310 ml    PHYSICAL EXAMINATION: General:  Elderly male, confused, no distress Neuro: No focal deficits HEENT:  Moist mucus membranes Cardiovascular:  S1, S2, RRR Lungs: Clear antr Abdomen:  Colostomy/peg in place Musculoskeletal: intact Skin: 1+ Edema.  LABS:  CBC  Recent Labs Lab 09/20/14 0655 09/21/14 0250 09/22/14 0221  WBC 12.9* 10.6* 8.4  HGB 10.5* 9.7* 10.5*  HCT 31.2* 28.6* 30.0*  PLT 196 187 222   Coag's No results for input(s): APTT, INR in the last 168 hours.   BMET  Recent Labs Lab 09/21/14 0250 09/21/14 0500 09/22/14 0221  NA 140 139 146*  K 3.8 3.7 3.7  CL 109 108 109  CO2 16* 20* 24  BUN 59* 62* 68*  CREATININE 3.42* 3.38* 3.25*  GLUCOSE 98 109* 128*   Electrolytes  Recent Labs Lab  09/21/14 0250 09/21/14 0500 09/22/14 0221  CALCIUM 8.4* 8.6* 8.8*  MG 1.8 2.0  --   PHOS 5.9*  --   --    Sepsis Markers  Recent Labs Lab 09/20/14 0955 09/20/14 1011 09/20/14 1500 09/20/14 2022 09/21/14 0250 09/22/14 0221  LATICACIDVEN  --  1.71 1.5 1.1  --   --   PROCALCITON 6.67  --   --   --  6.13 3.91   ABG  Recent Labs Lab 09/20/14 0652  PHART 7.260*  PCO2ART 36.6  PO2ART 60.0*   Liver Enzymes  Recent Labs Lab 09/20/14 0655 09/21/14 0250  AST 39  --   ALT 43  --   ALKPHOS 71  --   BILITOT 0.7  --   ALBUMIN 2.7* 2.4*   Cardiac Enzymes  Recent Labs Lab 09/20/14 1500 09/20/14 2021 09/21/14 0250  TROPONINI 0.27* 0.29* 0.30*   Glucose  Recent Labs Lab 09/22/14 1608 09/22/14 2025 09/23/14 0028 09/23/14 0406 09/23/14 0741 09/23/14 1150  GLUCAP 153* 137* 142* 123* 155* 145*    Imaging No results found.   ASSESSMENT / PLAN:  PULMONARY OETT A: Acute Hypoxic Respiratory Failure - in setting of diffuse bilateral infiltrate, PNA vs CHF  P:   Improving O2 requirements. Wean down as tolerated. Intermittent  CXR  Pulmonary hygiene: IS, mobilize  BP control  Lasix as below   CARDIOVASCULAR CVL A:  ? CHF exacerbation - hx of, diffuse bilateral infiltrates HTN  R/O MI - negative troponin  P:  Continue lasix 40 mg qd. Monitor sr cr trend closely Trend BNP  Tele monitoring  Ensure adequate BP / rate control  ASA   RENAL A:   Acute on Chronic Renal Failure P:   Foley with strict I/O's  Renal US consistent with medical renal disease, no hydronephrosis Lasix as above Trend BMP  Replace electrolytes as indicated   GASTROINTESTINAL A:   Hx Colon CA s/p colectomy with colostomy & G-Tube P:   NPO  G-tube for TF / meds Begin TF 9/11 via PEG PPI PEG care BID & PRN   HEMATOLOGIC A:  Anemia of chronic disease  P:  Trend CBC Heparin for DVT prophylaxis   INFECTIOUS A:  Diffuse bilateral infiltrates - edema vs CHF, r/o  infectious etiology. On Augmentin (9/8) prior to admit SNF Resident  P:   BCx2 9/9 >> UC 9/9 >> multiple species present UC 9/10 >>  RVP 9/9 >>   Abx:  9/9 vanc >>  9/9 Fortaz >>  9/9 Levaquin >> 9/11  Pct is improving. Continue to monitor.  Will give total 7 days therapy.  ENDOCRINE A:   DM   P:   SSI  NEUROLOGIC A:   Advanced Dementia Hx of strokes P:   RASS goal:0 Avoid sedation Resume zoloft, aricept, reduced dose seroquel 9/11  FAMILY  - Updates: No family at bedside - Inter-disciplinary family meet or Palliative Care meeting due by:  day 7  Marshell Garfinkel MD [facility] Pager [phone] If no answer or after 3pm call: [phone] 09/23/2014, 1:43 PM

## 2014-09-24 LAB — COMPREHENSIVE METABOLIC PANEL
ALBUMIN: 2.7 g/dL — AB (ref 3.5–5.0)
ALT: 50 U/L (ref 17–63)
AST: 47 U/L — AB (ref 15–41)
Alkaline Phosphatase: 107 U/L (ref 38–126)
Anion gap: 12 (ref 5–15)
BUN: 89 mg/dL — AB (ref 6–20)
CHLORIDE: 116 mmol/L — AB (ref 101–111)
CO2: 24 mmol/L (ref 22–32)
CREATININE: 2.69 mg/dL — AB (ref 0.61–1.24)
Calcium: 9.6 mg/dL (ref 8.9–10.3)
GFR calc Af Amer: 26 mL/min — ABNORMAL LOW (ref >60)
GFR, EST NON AFRICAN AMERICAN: 23 mL/min — AB (ref >60)
GLUCOSE: 146 mg/dL — AB (ref 65–99)
Potassium: 3.9 mmol/L (ref 3.5–5.1)
Sodium: 152 mmol/L — ABNORMAL HIGH (ref 135–145)
Total Bilirubin: 0.9 mg/dL (ref 0.3–1.2)
Total Protein: 8.2 g/dL — ABNORMAL HIGH (ref 6.5–8.1)

## 2014-09-24 LAB — CBC WITH DIFFERENTIAL/PLATELET
BASOS ABS: 0 10*3/uL (ref 0.0–0.1)
BASOS PCT: 0 % (ref 0–1)
EOS PCT: 1 % (ref 0–5)
Eosinophils Absolute: 0.1 10*3/uL (ref 0.0–0.7)
HEMATOCRIT: 35 % — AB (ref 39.0–52.0)
Hemoglobin: 11.7 g/dL — ABNORMAL LOW (ref 13.0–17.0)
LYMPHS PCT: 5 % — AB (ref 12–46)
Lymphs Abs: 0.4 10*3/uL — ABNORMAL LOW (ref 0.7–4.0)
MCH: 25.9 pg — ABNORMAL LOW (ref 26.0–34.0)
MCHC: 33.4 g/dL (ref 30.0–36.0)
MCV: 77.6 fL — AB (ref 78.0–100.0)
Monocytes Absolute: 1.1 10*3/uL — ABNORMAL HIGH (ref 0.1–1.0)
Monocytes Relative: 14 % — ABNORMAL HIGH (ref 3–12)
NEUTROS ABS: 6.4 10*3/uL (ref 1.7–7.7)
Neutrophils Relative %: 80 % — ABNORMAL HIGH (ref 43–77)
PLATELETS: 271 10*3/uL (ref 150–400)
RBC: 4.51 MIL/uL (ref 4.22–5.81)
RDW: 15.5 % (ref 11.5–15.5)
WBC: 8 10*3/uL (ref 4.0–10.5)

## 2014-09-24 LAB — MAGNESIUM: Magnesium: 2.7 mg/dL — ABNORMAL HIGH (ref 1.7–2.4)

## 2014-09-24 LAB — RESPIRATORY VIRUS PANEL
ADENOVIRUS: NEGATIVE
INFLUENZA A: NEGATIVE
Influenza B: NEGATIVE
Metapneumovirus: NEGATIVE
PARAINFLUENZA 1 A: NEGATIVE
PARAINFLUENZA 2 A: NEGATIVE
Parainfluenza 3: NEGATIVE
RESPIRATORY SYNCYTIAL VIRUS B: NEGATIVE
Respiratory Syncytial Virus A: NEGATIVE
Rhinovirus: NEGATIVE

## 2014-09-24 LAB — PHOSPHORUS: Phosphorus: 4.2 mg/dL (ref 2.5–4.6)

## 2014-09-24 LAB — VANCOMYCIN, TROUGH: Vancomycin Tr: 18 ug/mL (ref 10.0–20.0)

## 2014-09-24 LAB — BRAIN NATRIURETIC PEPTIDE: B Natriuretic Peptide: 160.5 pg/mL — ABNORMAL HIGH (ref 0.0–100.0)

## 2014-09-24 NOTE — Care Management Note (Signed)
Case Management Note  Patient Details  Name: RUFUS BESKE MRN: [id number] Date of Birth: Jun 30, 1945  Subjective/Objective:       [facility] offered bed.  Updated Physician, plan for discharge to [facility] today.  Discussed with both sister [place] and brother [name] and they both were agreeable to [facility] tx today.              Action/Plan:   Expected Discharge Date:                  Expected Discharge Plan:  Long Term Acute Care (LTAC)  In-House Referral:     Discharge planning Services  CM Consult  Post Acute Care Choice:    Choice offered to:     DME Arranged:    DME Agency:     HH Arranged:    Advance Agency:     Status of Service:  Completed, signed off  Medicare Important Message Given:    Date Medicare IM Given:    Medicare IM give by:    Date Additional Medicare IM Given:    Additional Medicare Important Message give by:     If discussed at [place] of Stay Meetings, dates discussed:    Additional Comments:  Vergie Living, RN 09/24/2014, 7:38 AM

## 2014-09-25 LAB — BASIC METABOLIC PANEL
Anion gap: 11 (ref 5–15)
BUN: 92 mg/dL — ABNORMAL HIGH (ref 6–20)
CHLORIDE: 116 mmol/L — AB (ref 101–111)
CO2: 24 mmol/L (ref 22–32)
Calcium: 9.5 mg/dL (ref 8.9–10.3)
Creatinine, Ser: 2.35 mg/dL — ABNORMAL HIGH (ref 0.61–1.24)
GFR calc non Af Amer: 27 mL/min — ABNORMAL LOW (ref >60)
GFR, EST AFRICAN AMERICAN: 31 mL/min — AB (ref >60)
Glucose, Bld: 126 mg/dL — ABNORMAL HIGH (ref 65–99)
POTASSIUM: 3.8 mmol/L (ref 3.5–5.1)
SODIUM: 151 mmol/L — AB (ref 135–145)

## 2014-09-25 LAB — PROCALCITONIN: Procalcitonin: 1.04 ng/mL

## 2014-09-25 LAB — CULTURE, BLOOD (ROUTINE X 2)
CULTURE: NO GROWTH
CULTURE: NO GROWTH

## 2014-09-26 LAB — CBC WITH DIFFERENTIAL/PLATELET
Basophils Absolute: 0.1 10*3/uL (ref 0.0–0.1)
Basophils Relative: 1 %
EOS ABS: 0.1 10*3/uL (ref 0.0–0.7)
EOS PCT: 1 %
HCT: 35.1 % — ABNORMAL LOW (ref 39.0–52.0)
HEMOGLOBIN: 11.1 g/dL — AB (ref 13.0–17.0)
LYMPHS ABS: 0.8 10*3/uL (ref 0.7–4.0)
LYMPHS PCT: 10 %
MCH: 25.1 pg — AB (ref 26.0–34.0)
MCHC: 31.6 g/dL (ref 30.0–36.0)
MCV: 79.2 fL (ref 78.0–100.0)
MONOS PCT: 9 %
Monocytes Absolute: 0.7 10*3/uL (ref 0.1–1.0)
NEUTROS PCT: 79 %
Neutro Abs: 5.8 10*3/uL (ref 1.7–7.7)
Platelets: 299 10*3/uL (ref 150–400)
RBC: 4.43 MIL/uL (ref 4.22–5.81)
RDW: 16 % — ABNORMAL HIGH (ref 11.5–15.5)
WBC: 7.4 10*3/uL (ref 4.0–10.5)

## 2014-09-26 LAB — BASIC METABOLIC PANEL
Anion gap: 9 (ref 5–15)
BUN: 94 mg/dL — AB (ref 6–20)
CHLORIDE: 118 mmol/L — AB (ref 101–111)
CO2: 25 mmol/L (ref 22–32)
CREATININE: 2.41 mg/dL — AB (ref 0.61–1.24)
Calcium: 9.5 mg/dL (ref 8.9–10.3)
GFR calc Af Amer: 30 mL/min — ABNORMAL LOW (ref >60)
GFR calc non Af Amer: 26 mL/min — ABNORMAL LOW (ref >60)
GLUCOSE: 150 mg/dL — AB (ref 65–99)
POTASSIUM: 4.2 mmol/L (ref 3.5–5.1)
SODIUM: 152 mmol/L — AB (ref 135–145)

## 2014-09-27 LAB — RENAL FUNCTION PANEL
ANION GAP: 11 (ref 5–15)
Albumin: 2.7 g/dL — ABNORMAL LOW (ref 3.5–5.0)
BUN: 95 mg/dL — ABNORMAL HIGH (ref 6–20)
CALCIUM: 9.3 mg/dL (ref 8.9–10.3)
CHLORIDE: 117 mmol/L — AB (ref 101–111)
CO2: 21 mmol/L — AB (ref 22–32)
CREATININE: 2.24 mg/dL — AB (ref 0.61–1.24)
GFR calc Af Amer: 33 mL/min — ABNORMAL LOW (ref >60)
GFR calc non Af Amer: 28 mL/min — ABNORMAL LOW (ref >60)
GLUCOSE: 138 mg/dL — AB (ref 65–99)
Phosphorus: 4.9 mg/dL — ABNORMAL HIGH (ref 2.5–4.6)
Potassium: 6.5 mmol/L (ref 3.5–5.1)
SODIUM: 149 mmol/L — AB (ref 135–145)

## 2014-09-27 LAB — PROCALCITONIN: PROCALCITONIN: 0.61 ng/mL

## 2014-09-27 LAB — POTASSIUM: Potassium: 4.4 mmol/L (ref 3.5–5.1)

## 2014-09-28 LAB — RENAL FUNCTION PANEL
ANION GAP: 10 (ref 5–15)
Albumin: 2.9 g/dL — ABNORMAL LOW (ref 3.5–5.0)
BUN: 96 mg/dL — ABNORMAL HIGH (ref 6–20)
CO2: 24 mmol/L (ref 22–32)
Calcium: 10 mg/dL (ref 8.9–10.3)
Chloride: 118 mmol/L — ABNORMAL HIGH (ref 101–111)
Creatinine, Ser: 2.3 mg/dL — ABNORMAL HIGH (ref 0.61–1.24)
GFR calc Af Amer: 32 mL/min — ABNORMAL LOW (ref >60)
GFR calc non Af Amer: 27 mL/min — ABNORMAL LOW (ref >60)
GLUCOSE: 162 mg/dL — AB (ref 65–99)
PHOSPHORUS: 5.8 mg/dL — AB (ref 2.5–4.6)
POTASSIUM: 4.9 mmol/L (ref 3.5–5.1)
Sodium: 152 mmol/L — ABNORMAL HIGH (ref 135–145)

## 2014-09-30 LAB — RENAL FUNCTION PANEL
ALBUMIN: 3.1 g/dL — AB (ref 3.5–5.0)
Anion gap: 11 (ref 5–15)
BUN: 102 mg/dL — AB (ref 6–20)
CALCIUM: 10.4 mg/dL — AB (ref 8.9–10.3)
CO2: 24 mmol/L (ref 22–32)
Chloride: 120 mmol/L — ABNORMAL HIGH (ref 101–111)
Creatinine, Ser: 2.23 mg/dL — ABNORMAL HIGH (ref 0.61–1.24)
GFR calc Af Amer: 33 mL/min — ABNORMAL LOW (ref >60)
GFR, EST NON AFRICAN AMERICAN: 28 mL/min — AB (ref >60)
Glucose, Bld: 171 mg/dL — ABNORMAL HIGH (ref 65–99)
PHOSPHORUS: 6.5 mg/dL — AB (ref 2.5–4.6)
POTASSIUM: 5.5 mmol/L — AB (ref 3.5–5.1)
SODIUM: 155 mmol/L — AB (ref 135–145)

## 2014-09-30 LAB — CBC
HEMATOCRIT: 41.6 % (ref 39.0–52.0)
HEMOGLOBIN: 13.6 g/dL (ref 13.0–17.0)
MCH: 26 pg (ref 26.0–34.0)
MCHC: 32.7 g/dL (ref 30.0–36.0)
MCV: 79.4 fL (ref 78.0–100.0)
Platelets: 278 10*3/uL (ref 150–400)
RBC: 5.24 MIL/uL (ref 4.22–5.81)
RDW: 16.7 % — AB (ref 11.5–15.5)
WBC: 7.5 10*3/uL (ref 4.0–10.5)

## 2014-10-01 LAB — RENAL FUNCTION PANEL
ALBUMIN: 3.1 g/dL — AB (ref 3.5–5.0)
Anion gap: 10 (ref 5–15)
BUN: 102 mg/dL — AB (ref 6–20)
CALCIUM: 9.7 mg/dL (ref 8.9–10.3)
CO2: 22 mmol/L (ref 22–32)
CREATININE: 2.24 mg/dL — AB (ref 0.61–1.24)
Chloride: 119 mmol/L — ABNORMAL HIGH (ref 101–111)
GFR calc Af Amer: 33 mL/min — ABNORMAL LOW (ref >60)
GFR, EST NON AFRICAN AMERICAN: 28 mL/min — AB (ref >60)
GLUCOSE: 168 mg/dL — AB (ref 65–99)
PHOSPHORUS: 5.8 mg/dL — AB (ref 2.5–4.6)
POTASSIUM: 5.5 mmol/L — AB (ref 3.5–5.1)
SODIUM: 151 mmol/L — AB (ref 135–145)

## 2014-10-02 LAB — POTASSIUM: Potassium: 4.9 mmol/L (ref 3.5–5.1)

## 2014-10-03 LAB — RENAL FUNCTION PANEL
ANION GAP: 10 (ref 5–15)
Albumin: 3 g/dL — ABNORMAL LOW (ref 3.5–5.0)
BUN: 81 mg/dL — ABNORMAL HIGH (ref 6–20)
CHLORIDE: 113 mmol/L — AB (ref 101–111)
CO2: 21 mmol/L — AB (ref 22–32)
Calcium: 9.4 mg/dL (ref 8.9–10.3)
Creatinine, Ser: 2.01 mg/dL — ABNORMAL HIGH (ref 0.61–1.24)
GFR, EST AFRICAN AMERICAN: 37 mL/min — AB (ref >60)
GFR, EST NON AFRICAN AMERICAN: 32 mL/min — AB (ref >60)
Glucose, Bld: 160 mg/dL — ABNORMAL HIGH (ref 65–99)
POTASSIUM: 4.5 mmol/L (ref 3.5–5.1)
Phosphorus: 5.1 mg/dL — ABNORMAL HIGH (ref 2.5–4.6)
Sodium: 144 mmol/L (ref 135–145)

## 2014-10-05 LAB — CBC WITH DIFFERENTIAL/PLATELET
BASOS PCT: 1 %
Basophils Absolute: 0.1 10*3/uL (ref 0.0–0.1)
EOS ABS: 0.1 10*3/uL (ref 0.0–0.7)
Eosinophils Relative: 1 %
HCT: 35.7 % — ABNORMAL LOW (ref 39.0–52.0)
HEMOGLOBIN: 11.5 g/dL — AB (ref 13.0–17.0)
LYMPHS ABS: 0.7 10*3/uL (ref 0.7–4.0)
LYMPHS PCT: 11 %
MCH: 25.6 pg — AB (ref 26.0–34.0)
MCHC: 32.2 g/dL (ref 30.0–36.0)
MCV: 79.5 fL (ref 78.0–100.0)
MONO ABS: 0.8 10*3/uL (ref 0.1–1.0)
Monocytes Relative: 13 %
NEUTROS ABS: 4.6 10*3/uL (ref 1.7–7.7)
Neutrophils Relative %: 74 %
Platelets: 235 10*3/uL (ref 150–400)
RBC: 4.49 MIL/uL (ref 4.22–5.81)
RDW: 16.5 % — AB (ref 11.5–15.5)
WBC: 6.3 10*3/uL (ref 4.0–10.5)

## 2014-10-05 LAB — RENAL FUNCTION PANEL
Albumin: 2.9 g/dL — ABNORMAL LOW (ref 3.5–5.0)
Anion gap: 9 (ref 5–15)
BUN: 79 mg/dL — ABNORMAL HIGH (ref 6–20)
CHLORIDE: 114 mmol/L — AB (ref 101–111)
CO2: 22 mmol/L (ref 22–32)
CREATININE: 2.04 mg/dL — AB (ref 0.61–1.24)
Calcium: 9.1 mg/dL (ref 8.9–10.3)
GFR calc non Af Amer: 32 mL/min — ABNORMAL LOW (ref >60)
GFR, EST AFRICAN AMERICAN: 37 mL/min — AB (ref >60)
Glucose, Bld: 131 mg/dL — ABNORMAL HIGH (ref 65–99)
Phosphorus: 5 mg/dL — ABNORMAL HIGH (ref 2.5–4.6)
Potassium: 5.4 mmol/L — ABNORMAL HIGH (ref 3.5–5.1)
Sodium: 145 mmol/L (ref 135–145)

## 2014-10-05 LAB — MAGNESIUM: MAGNESIUM: 2.7 mg/dL — AB (ref 1.7–2.4)

## 2014-10-06 LAB — POTASSIUM: POTASSIUM: 4.6 mmol/L (ref 3.5–5.1)

## 2014-10-08 LAB — RENAL FUNCTION PANEL
Albumin: 2.9 g/dL — ABNORMAL LOW (ref 3.5–5.0)
Anion gap: 8 (ref 5–15)
BUN: 69 mg/dL — AB (ref 6–20)
CHLORIDE: 112 mmol/L — AB (ref 101–111)
CO2: 24 mmol/L (ref 22–32)
CREATININE: 1.92 mg/dL — AB (ref 0.61–1.24)
Calcium: 9.4 mg/dL (ref 8.9–10.3)
GFR calc Af Amer: 39 mL/min — ABNORMAL LOW (ref >60)
GFR calc non Af Amer: 34 mL/min — ABNORMAL LOW (ref >60)
GLUCOSE: 145 mg/dL — AB (ref 65–99)
POTASSIUM: 5.2 mmol/L — AB (ref 3.5–5.1)
Phosphorus: 4.7 mg/dL — ABNORMAL HIGH (ref 2.5–4.6)
Sodium: 144 mmol/L (ref 135–145)

## 2014-10-09 LAB — POTASSIUM
POTASSIUM: 5.9 mmol/L — AB (ref 3.5–5.1)
Potassium: 5.3 mmol/L — ABNORMAL HIGH (ref 3.5–5.1)

## 2014-10-10 LAB — RENAL FUNCTION PANEL
ALBUMIN: 3 g/dL — AB (ref 3.5–5.0)
ANION GAP: 6 (ref 5–15)
BUN: 67 mg/dL — AB (ref 6–20)
CO2: 26 mmol/L (ref 22–32)
Calcium: 9.7 mg/dL (ref 8.9–10.3)
Chloride: 115 mmol/L — ABNORMAL HIGH (ref 101–111)
Creatinine, Ser: 1.91 mg/dL — ABNORMAL HIGH (ref 0.61–1.24)
GFR, EST AFRICAN AMERICAN: 40 mL/min — AB (ref >60)
GFR, EST NON AFRICAN AMERICAN: 34 mL/min — AB (ref >60)
GLUCOSE: 119 mg/dL — AB (ref 65–99)
POTASSIUM: 5.4 mmol/L — AB (ref 3.5–5.1)
Phosphorus: 5.8 mg/dL — ABNORMAL HIGH (ref 2.5–4.6)
SODIUM: 147 mmol/L — AB (ref 135–145)

## 2014-10-11 LAB — RENAL FUNCTION PANEL
Albumin: 3.2 g/dL — ABNORMAL LOW (ref 3.5–5.0)
Anion gap: 9 (ref 5–15)
BUN: 74 mg/dL — AB (ref 6–20)
CALCIUM: 9.7 mg/dL (ref 8.9–10.3)
CHLORIDE: 113 mmol/L — AB (ref 101–111)
CO2: 24 mmol/L (ref 22–32)
CREATININE: 2 mg/dL — AB (ref 0.61–1.24)
GFR calc Af Amer: 37 mL/min — ABNORMAL LOW (ref >60)
GFR calc non Af Amer: 32 mL/min — ABNORMAL LOW (ref >60)
GLUCOSE: 160 mg/dL — AB (ref 65–99)
Phosphorus: 5.6 mg/dL — ABNORMAL HIGH (ref 2.5–4.6)
Potassium: 5.1 mmol/L (ref 3.5–5.1)
SODIUM: 146 mmol/L — AB (ref 135–145)

## 2014-10-31 ENCOUNTER — Observation Stay (HOSPITAL_COMMUNITY)
Admission: EM | Admit: 2014-10-31 | Discharge: 2014-11-01 | Disposition: A | Discharge status: Home / Self Care | Payer: No Typology Code available for payment source | Attending: Internal Medicine | Admitting: Internal Medicine

## 2014-10-31 ENCOUNTER — Encounter (HOSPITAL_COMMUNITY): Payer: Self-pay | Admitting: Emergency Medicine

## 2014-10-31 DIAGNOSIS — Z85038 Personal history of other malignant neoplasm of large intestine: Secondary | ICD-10-CM | POA: Diagnosis not present

## 2014-10-31 DIAGNOSIS — N390 Urinary tract infection, site not specified: Secondary | ICD-10-CM | POA: Insufficient documentation

## 2014-10-31 DIAGNOSIS — Z79899 Other long term (current) drug therapy: Secondary | ICD-10-CM | POA: Diagnosis not present

## 2014-10-31 DIAGNOSIS — Z794 Long term (current) use of insulin: Secondary | ICD-10-CM | POA: Insufficient documentation

## 2014-10-31 DIAGNOSIS — N189 Chronic kidney disease, unspecified: Secondary | ICD-10-CM | POA: Diagnosis present

## 2014-10-31 DIAGNOSIS — I10 Essential (primary) hypertension: Secondary | ICD-10-CM | POA: Diagnosis not present

## 2014-10-31 DIAGNOSIS — E119 Type 2 diabetes mellitus without complications: Secondary | ICD-10-CM | POA: Insufficient documentation

## 2014-10-31 DIAGNOSIS — D649 Anemia, unspecified: Secondary | ICD-10-CM | POA: Diagnosis not present

## 2014-10-31 DIAGNOSIS — I509 Heart failure, unspecified: Secondary | ICD-10-CM | POA: Diagnosis not present

## 2014-10-31 DIAGNOSIS — E785 Hyperlipidemia, unspecified: Secondary | ICD-10-CM | POA: Insufficient documentation

## 2014-10-31 DIAGNOSIS — N289 Disorder of kidney and ureter, unspecified: Secondary | ICD-10-CM | POA: Insufficient documentation

## 2014-10-31 DIAGNOSIS — F0280 Dementia in other diseases classified elsewhere without behavioral disturbance: Secondary | ICD-10-CM | POA: Insufficient documentation

## 2014-10-31 DIAGNOSIS — G309 Alzheimer's disease, unspecified: Secondary | ICD-10-CM | POA: Insufficient documentation

## 2014-10-31 DIAGNOSIS — F22 Delusional disorders: Secondary | ICD-10-CM | POA: Insufficient documentation

## 2014-10-31 DIAGNOSIS — Z7982 Long term (current) use of aspirin: Secondary | ICD-10-CM | POA: Insufficient documentation

## 2014-10-31 DIAGNOSIS — F329 Major depressive disorder, single episode, unspecified: Secondary | ICD-10-CM | POA: Diagnosis not present

## 2014-10-31 DIAGNOSIS — E876 Hypokalemia: Secondary | ICD-10-CM | POA: Diagnosis not present

## 2014-10-31 DIAGNOSIS — E871 Hypo-osmolality and hyponatremia: Secondary | ICD-10-CM | POA: Diagnosis not present

## 2014-10-31 DIAGNOSIS — Z8673 Personal history of transient ischemic attack (TIA), and cerebral infarction without residual deficits: Secondary | ICD-10-CM | POA: Diagnosis not present

## 2014-10-31 DIAGNOSIS — R001 Bradycardia, unspecified: Secondary | ICD-10-CM | POA: Diagnosis not present

## 2014-10-31 DIAGNOSIS — H9190 Unspecified hearing loss, unspecified ear: Secondary | ICD-10-CM | POA: Diagnosis not present

## 2014-10-31 LAB — I-STAT TROPONIN, ED: TROPONIN I, POC: 0.03 ng/mL (ref 0.00–0.08)

## 2014-10-31 LAB — CBC
HCT: 27 % — ABNORMAL LOW (ref 39.0–52.0)
HEMOGLOBIN: 9.3 g/dL — AB (ref 13.0–17.0)
MCH: 26.1 pg (ref 26.0–34.0)
MCHC: 34.4 g/dL (ref 30.0–36.0)
MCV: 75.8 fL — ABNORMAL LOW (ref 78.0–100.0)
Platelets: 150 10*3/uL (ref 150–400)
RBC: 3.56 MIL/uL — ABNORMAL LOW (ref 4.22–5.81)
RDW: 15.3 % (ref 11.5–15.5)
WBC: 4.7 10*3/uL (ref 4.0–10.5)

## 2014-10-31 LAB — I-STAT CHEM 8, ED
BUN: 63 mg/dL — ABNORMAL HIGH (ref 6–20)
CREATININE: 2.8 mg/dL — AB (ref 0.61–1.24)
Calcium, Ion: 1.28 mmol/L (ref 1.13–1.30)
Chloride: 99 mmol/L — ABNORMAL LOW (ref 101–111)
GLUCOSE: 133 mg/dL — AB (ref 65–99)
HEMATOCRIT: 30 % — AB (ref 39.0–52.0)
HEMOGLOBIN: 10.2 g/dL — AB (ref 13.0–17.0)
Potassium: 5.3 mmol/L — ABNORMAL HIGH (ref 3.5–5.1)
Sodium: 124 mmol/L — ABNORMAL LOW (ref 135–145)
TCO2: 16 mmol/L (ref 0–100)

## 2014-10-31 LAB — GLUCOSE, CAPILLARY: Glucose-Capillary: 126 mg/dL — ABNORMAL HIGH (ref 65–99)

## 2014-10-31 MED ORDER — SODIUM CHLORIDE 0.9 % IV SOLN
INTRAVENOUS | Status: DC
  Administered 2014-10-31 – 2014-11-01 (×2): via INTRAVENOUS

## 2014-10-31 NOTE — ED Provider Notes (Addendum)
CSN: [id number]     Arrival date & time 10/31/14  2004 History   First MD Initiated Contact with Patient 10/31/14 2009     Chief Complaint  Patient presents with  . Bradycardia    Level V caveat dementia history is obtained from records coming patient and from EMS (Consider location/radiation/quality/duration/timing/severity/associated sxs/prior Treatment) HPI Patient noted to be bradycardic at nursing home. Patient asymptomatic. Denies pain anywhere. His sister accompanies him and states that he looks at baseline. Past Medical History  Diagnosis Date  . Hyponatremia   . Hypokalemia   . Anemia   . Diabetes mellitus without complication ([place])   . Hypertension   . UTI (lower urinary tract infection)   . Delusional disorder ([place])   . HOH (hard of hearing)   . Hyperlipidemia   . CHF (congestive heart failure) ([place])   . Renal insufficiency   . Stroke ([place])   . Dementia   . Depression   . Dysphagia   . Falls frequently   . Colon cancer ([place]) 2009    s/p Colectomy  . Alzheimer's dementia    Past Surgical History  Procedure Laterality Date  . Colon surgery    . Colectomy  09/06/2007  . Ileostomy    . Peg tube removal    . Cardiac catheterization    . Cholecystectomy    . Abdominal adhesion surgery     No family history on file. Social History  Substance Use Topics  . Smoking status: Never Smoker   . Smokeless tobacco: None  . Alcohol Use: No    Review of Systems  Unable to perform ROS: Dementia  HENT: Positive for hearing loss.        Chronically hard of hearing      Allergies  Review of patient's allergies indicates no known allergies.  Home Medications   Prior to Admission medications   Medication Sig Start Date End Date Taking? Authorizing Provider  amLODipine (NORVASC) 10 MG tablet Take 10 mg by mouth daily.   Yes Historical Provider, MD  aspirin 81 MG chewable tablet Give 81 mg by tube daily.   Yes Historical Provider, MD  atorvastatin (LIPITOR) 20  MG tablet Give 20 mg by tube daily.   Yes Historical Provider, MD  cloNIDine (CATAPRES) 0.2 MG tablet Take 0.2 mg by mouth See admin instructions. Take one tablet by mouth once a week. Take on Saturdays per [facility]   Yes Historical Provider, MD  donepezil (ARICEPT) 10 MG tablet Give 10 mg by tube at bedtime.    Yes Historical Provider, MD  ferrous sulfate 220 (44 FE) MG/5ML solution Place 330 mg into feeding tube daily.   Yes Historical Provider, MD  hydrALAZINE (APRESOLINE) 50 MG tablet Take 50 mg by mouth 4 (four) times daily - after meals and at bedtime.   Yes Historical Provider, MD  Multiple Vitamins-Minerals (CERTAGEN PO) Give 1 tablet by tube daily.   Yes Historical Provider, MD  antiseptic oral rinse (CPC / CETYLPYRIDINIUM CHLORIDE 0.05%) 0.05 % LIQD solution 7 mLs by Mouth Rinse route 2 (two) times daily. 09/23/14   Donita Brooks, NP  cefTAZidime 2 g in dextrose 5 % 50 mL Inject 2 g into the vein daily. 09/23/14   Donita Brooks, NP  cyanocobalamin (,VITAMIN B-12,) 1000 MCG/ML injection Inject 1,000 mcg into the muscle every 30 (thirty) days.    Historical Provider, MD  furosemide (LASIX) 20 MG tablet Place 2 tablets (40 mg total) into feeding tube daily. 09/23/14  Donita Brooks, NP  heparin 5000 UNIT/ML injection Inject 1 mL (5,000 Units total) into the skin every 8 (eight) hours. 09/23/14   Donita Brooks, NP  hydrALAZINE (APRESOLINE) 10 MG tablet Take 15 mg by mouth 4 (four) times daily.    Historical Provider, MD  insulin aspart (NOVOLOG) 100 UNIT/ML injection Inject 0-9 Units into the skin every 4 (four) hours. CBG < 70: implement hypoglycemia protocol CBG 70 - 120: 0 units CBG 121 - 150: 1 unit CBG 151 - 200: 2 units CBG 201 - 250: 3 units CBG 251 - 300: 5 units CBG 301 - 350: 7 units CBG 351 - 400: 9 units CBG > 400: call MD and obtain STAT lab verification 09/23/14   Donita Brooks, NP  Nutritional Supplements (FEEDING SUPPLEMENT, VITAL HIGH PROTEIN,) LIQD liquid TF at 40 ml/hr  09/23/14   Donita Brooks, NP  pantoprazole (PROTONIX) 40 MG injection Inject 40 mg into the vein daily. 09/23/14   Donita Brooks, NP  QUEtiapine (SEROQUEL) 25 MG tablet Place 1 tablet (25 mg total) into feeding tube at bedtime. 09/23/14   Donita Brooks, NP  sennosides (SENOKOT) 8.8 MG/5ML syrup Place 15 mLs into feeding tube daily.    Historical Provider, MD  sertraline (ZOLOFT) 100 MG tablet Take 150 mg by mouth daily.    Historical Provider, MD  vancomycin 1,500 mg in sodium chloride 0.9 % 500 mL Inject 1,500 mg into the vein every other day. 09/23/14   Donita Brooks, NP  vitamin C (ASCORBIC ACID) 500 MG tablet Give 1,000 mg by tube 2 (two) times daily.     Historical Provider, MD   BP 113/57 mmHg  Pulse 42  Temp(Src) 97.5 F (36.4 C) (Oral)  Resp 16  Ht 5\' 9"  (1.753 m)  Wt 220 lb (99.791 kg)  BMI 32.47 kg/m2 Physical Exam  Constitutional:  Chronically ill-appearing  HENT:  Head: Normocephalic and atraumatic.  Eyes: Conjunctivae are normal. Pupils are equal, round, and reactive to light.  Neck: Neck supple. No tracheal deviation present. No thyromegaly present.  Cardiovascular: Regular rhythm.   No murmur heard. Bradycardic  Pulmonary/Chest: Effort normal and breath sounds normal.  Abdominal: Soft. Bowel sounds are normal. He exhibits no distension. There is no tenderness.  Musculoskeletal: Normal range of motion. He exhibits no edema or tenderness.  Neurological: He is alert. Coordination normal.  Skin: Skin is warm and dry. No rash noted.  Psychiatric: He has a normal mood and affect.  Nursing note and vitals reviewed.   ED Course  Procedures (including critical care time) Labs Review Labs Reviewed  CBC  I-STAT CHEM 8, ED    Imaging Review No results found. I have personally reviewed and evaluated these images and lab results as part of my medical decision-making.   EKG Interpretation None     ED ECG REPORT   Date: 10/31/2014  Rate: 35  Rhythm: Junctional  bradycardia  QRS Axis: normal  Intervals: normal  ST/T Wave abnormalities: nonspecific T wave changes  Conduction Disutrbances:nonspecific intraventricular conduction delay  Narrative Interpretation:   Old EKG Reviewed: Inferolateral ischemic changes seen on 09/20/2014 have resolved  I have personally reviewed the EKG tracing and agree with the computerized printout as noted.Marland Kitchen Results for orders placed or performed during the hospital encounter of 10/31/14  CBC  Result Value Ref Range   WBC 4.7 4.0 - 10.5 K/uL   RBC 3.56 (L) 4.22 - 5.81 MIL/uL   Hemoglobin 9.3 (L) 13.0 -  17.0 g/dL   HCT 27.0 (L) 39.0 - 52.0 %   MCV 75.8 (L) 78.0 - 100.0 fL   MCH 26.1 26.0 - 34.0 pg   MCHC 34.4 30.0 - 36.0 g/dL   RDW 15.3 11.5 - 15.5 %   Platelets 150 150 - 400 K/uL  I-Stat Chem 8, ED  Result Value Ref Range   Sodium 124 (L) 135 - 145 mmol/L   Potassium 5.3 (H) 3.5 - 5.1 mmol/L   Chloride 99 (L) 101 - 111 mmol/L   BUN 63 (H) 6 - 20 mg/dL   Creatinine, Ser 2.80 (H) 0.61 - 1.24 mg/dL   Glucose, Bld 133 (H) 65 - 99 mg/dL   Calcium, Ion 1.28 1.13 - 1.30 mmol/L   TCO2 16 0 - 100 mmol/L   Hemoglobin 10.2 (L) 13.0 - 17.0 g/dL   HCT 30.0 (L) 39.0 - 52.0 %  I-stat troponin, ED  Result Value Ref Range   Troponin i, poc 0.03 0.00 - 0.08 ng/mL   Comment 3           No results found.  MDM  I had it at length discussion with the patient's sister and next of kin. He is presently full code. She would want a pacemaker patient needed one. I spoke with Dr. Olevia Bowens who will arrange for inpatient stay. Normal saline intravenous fluids ordered. Patient does not require emergency pacing as he was bradycardic in prior tracing from September 2016 and is asymptomatic.Pt likely dehydrated with worsening renal function  Final diagnoses:  None  Dx #1 bradycardia #2 hyponatremia #3 acute on chronic kidney injury      Orlie Dakin, MD 10/31/14 2138 Addendum I spoke with Dr.Tilley from cardiology service. In  light of patient's bradycardia being chronic. Hospitalist physician can consult the cardiologist service and they will see pt during this hospitalization at hospitalist's request request  Orlie Dakin, MD 10/31/14 2155

## 2014-10-31 NOTE — ED Notes (Signed)
Family at bedside. 

## 2014-10-31 NOTE — ED Notes (Signed)
MD at bedside. 

## 2014-10-31 NOTE — ED Notes (Signed)
Pt in EMS from [facility] living facility. They said pt HR been in 40's all day, normal is 60's. Pt has no complaints, hx dementia A/OX2 which is pt baseline.

## 2014-10-31 NOTE — H&P (Signed)
[facility] History and Physical  JAMISEN DUERSON MWU:[id number] DOB: August 05, 1945 DOA: 10/31/2014  Referring physician: Orlie Dakin, MD PCP: No PCP Per Patient   Chief Complaint: Bradycardia.  HPI: [name] is a 69 y.o. male with below extensive past medical history who was brought to the ED via EMS, referred by from [facility] living facility for evaluation of bradycardia. He is oriented to name and knows he is in a hospital, but he is otherwise disoriented and unable to provide further details. History is taken from records and ED medical staff.  When seen the patient was in no acute distress and denied any complaints.  Review of Systems:  Unable to a pain due to the patient's mental status.  Past Medical History  Diagnosis Date  . Hyponatremia   . Hypokalemia   . Anemia   . Diabetes mellitus without complication ([place])   . Hypertension   . UTI (lower urinary tract infection)   . Delusional disorder ([place])   . HOH (hard of hearing)   . Hyperlipidemia   . CHF (congestive heart failure) ([place])   . Renal insufficiency   . Stroke ([place])   . Dementia   . Depression   . Dysphagia   . Falls frequently   . Colon cancer ([place]) 2009    s/p Colectomy  . Alzheimer's dementia    Past Surgical History  Procedure Laterality Date  . Colon surgery    . Colectomy  09/06/2007  . Ileostomy    . Peg tube removal    . Cardiac catheterization    . Cholecystectomy    . Abdominal adhesion surgery     Social History:  reports that he has never smoked. He does not have any smokeless tobacco history on file. He reports that he does not drink alcohol or use illicit drugs.  No Known Allergies  No family history on file.  Unable to obtain due to patient's mental status.  Prior to Admission medications   Medication Sig Start Date End Date Taking? Authorizing Provider  Amino Acids-Protein Hydrolys (FEEDING SUPPLEMENT, PRO-STAT SUGAR FREE 64,) LIQD Take 30 mLs by mouth 2  (two) times daily.   Yes Historical Provider, MD  amLODipine (NORVASC) 10 MG tablet Take 10 mg by mouth daily.   Yes Historical Provider, MD  aspirin 81 MG chewable tablet Give 81 mg by tube daily.   Yes Historical Provider, MD  atorvastatin (LIPITOR) 20 MG tablet Give 20 mg by tube daily.   Yes Historical Provider, MD  cloNIDine (CATAPRES) 0.2 MG tablet Take 0.2 mg by mouth See admin instructions. Take one tablet by mouth once a week. Take on Saturdays per [facility]   Yes Historical Provider, MD  cyanocobalamin (,VITAMIN B-12,) 1000 MCG/ML injection Inject 1,000 mcg into the muscle every 30 (thirty) days.   Yes Historical Provider, MD  donepezil (ARICEPT) 10 MG tablet Give 10 mg by tube at bedtime.    Yes Historical Provider, MD  ferrous sulfate 220 (44 FE) MG/5ML solution Place 330 mg into feeding tube daily.   Yes Historical Provider, MD  hydrALAZINE (APRESOLINE) 50 MG tablet Take 50 mg by mouth 4 (four) times daily - after meals and at bedtime.   Yes Historical Provider, MD  insulin glargine (LANTUS) 100 UNIT/ML injection Inject 20 Units into the skin at bedtime.   Yes Historical Provider, MD  insulin lispro (HUMALOG) 100 UNIT/ML injection Inject 2-12 Units into the skin 4 (four) times daily. MAR has Insulin given 4  times daily with meals. 201-250=2units, 251-300units=4units, 301-350=6units, 351-400=8units, 401-450=10units, >451=12 units   Yes Historical Provider, MD  Multiple Vitamin (MULTIVITAMIN) LIQD Take 5 mLs by mouth daily.   Yes Historical Provider, MD  pantoprazole (PROTONIX) 40 MG injection Inject 40 mg into the vein daily. 09/23/14  Yes Donita Brooks, NP  QUEtiapine (SEROQUEL) 25 MG tablet Take 25 mg by mouth.   Yes Historical Provider, MD  sertraline (ZOLOFT) 100 MG tablet Take 150 mg by mouth daily.   Yes Historical Provider, MD  tamsulosin (FLOMAX) 0.4 MG CAPS capsule Take 0.4 mg by mouth.   Yes Historical Provider, MD  traZODone (DESYREL) 50 MG tablet Take 25 mg by mouth 2 (two) times  daily.   Yes Historical Provider, MD  vitamin C (ASCORBIC ACID) 500 MG tablet Give 1,000 mg by tube 2 (two) times daily.    Yes Historical Provider, MD  antiseptic oral rinse (CPC / CETYLPYRIDINIUM CHLORIDE 0.05%) 0.05 % LIQD solution 7 mLs by Mouth Rinse route 2 (two) times daily. Patient not taking: Reported on 10/31/2014 09/23/14   Donita Brooks, NP  cefTAZidime 2 g in dextrose 5 % 50 mL Inject 2 g into the vein daily. Patient not taking: Reported on 10/31/2014 09/23/14   Donita Brooks, NP  furosemide (LASIX) 20 MG tablet Place 2 tablets (40 mg total) into feeding tube daily. Patient not taking: Reported on 10/31/2014 09/23/14   Donita Brooks, NP  heparin 5000 UNIT/ML injection Inject 1 mL (5,000 Units total) into the skin every 8 (eight) hours. Patient not taking: Reported on 10/31/2014 09/23/14   Donita Brooks, NP  insulin aspart (NOVOLOG) 100 UNIT/ML injection Inject 0-9 Units into the skin every 4 (four) hours. CBG < 70: implement hypoglycemia protocol CBG 70 - 120: 0 units CBG 121 - 150: 1 unit CBG 151 - 200: 2 units CBG 201 - 250: 3 units CBG 251 - 300: 5 units CBG 301 - 350: 7 units CBG 351 - 400: 9 units CBG > 400: call MD and obtain STAT lab verification Patient not taking: Reported on 10/31/2014 09/23/14   Donita Brooks, NP  Nutritional Supplements (FEEDING SUPPLEMENT, VITAL HIGH PROTEIN,) LIQD liquid TF at 40 ml/hr Patient not taking: Reported on 10/31/2014 09/23/14   Donita Brooks, NP  QUEtiapine (SEROQUEL) 25 MG tablet Place 1 tablet (25 mg total) into feeding tube at bedtime. Patient not taking: Reported on 10/31/2014 09/23/14   Donita Brooks, NP  vancomycin 1,500 mg in sodium chloride 0.9 % 500 mL Inject 1,500 mg into the vein every other day. Patient not taking: Reported on 10/31/2014 09/23/14   Donita Brooks, NP   Physical Exam: Filed Vitals:   10/31/14 2015 10/31/14 2045 10/31/14 2115  BP: 113/57 125/54 131/54  Pulse: 42 40 34  Temp: 97.5 F (36.4 C)    TempSrc:  Oral    Resp: 16 17 13   Height: 5\' 9"  (1.753 m)    Weight: 99.791 kg (220 lb)    SpO2:  100% 100%    Wt Readings from Last 3 Encounters:  10/31/14 99.791 kg (220 lb)  09/23/14 106.4 kg (234 lb 9.1 oz)  11/07/13 111.131 kg (245 lb)    General:  Appears calm and comfortable Eyes: PERRL, normal lids, irises & conjunctiva ENT: Moderate difficulty hearing, lips & tongue Neck: no LAD, masses or thyromegaly Cardiovascular: Bradycardic at 40 bpm. 1+ LE edema, lower extremity lymphedema. Telemetry: Bradycardic at 40 bpm Respiratory: CTA bilaterally, no w/r/r.  Normal respiratory effort. Abdomen: Positive surgical scar, ileostomy in place with light brown coloured output, soft, no guarding or rebound tenderness. Skin: Positive for scalp and facial seborrheic dermatitis. Hyperpigmentation and thickening on lower extremities. Musculoskeletal: grossly normal tone BUE/BLE Psychiatric: grossly normal mood and affect, speech fluent and appropriate Neurologic: grossly non-focal.          Labs on Admission:  Basic Metabolic Panel:  Recent Labs Lab 10/31/14 2043  NA 124*  K 5.3*  CL 99*  GLUCOSE 133*  BUN 63*  CREATININE 2.80*   CBC:  Recent Labs Lab 10/31/14 2043 10/31/14 2044  WBC  --  4.7  HGB 10.2* 9.3*  HCT 30.0* 27.0*  MCV  --  75.8*  PLT  --  150    BNP (last 3 results)  Recent Labs  09/20/14 0655 09/22/14 0221 09/24/14 0806  BNP 1077.8* 589.5* 160.5*    Echocardiogram: 09/20/2014 ------------------------------------------------------------------- LV EF: 55% -  60%  ------------------------------------------------------------------- Indications:   CHF - 428.0.  ------------------------------------------------------------------- History:  Risk factors: Hypertension. Dyslipidemia.  ------------------------------------------------------------------- Study Conclusions  - Left ventricle: The cavity size was normal. Wall thickness was increased in a  pattern of mild LVH. Systolic function was normal. The estimated ejection fraction was in the range of 55% to 60%. Wall motion was normal; there were no regional wall motion abnormalities. Doppler parameters are consistent with abnormal left ventricular relaxation (grade 1 diastolic dysfunction). The E/e&' ratio is >15, suggesting elevated LV filling pressure. - Aortic valve: Trileaflet. Sclerosis without stenosis. There was no regurgitation. - Mitral valve: Mildly thickened leaflets . There was no significant regurgitation. - Left atrium: The atrium was at the upper limits of normal in size. - Right ventricle: The cavity size was mildly dilated. - Right atrium: The atrium was mildly dilated. Central venous pressure (est): 15 mm Hg (in the setting of positive pressure ventilation) - Atrial septum: No defect or patent foramen ovale was identified. - Tricuspid valve: There was moderate regurgitation. - Pulmonary arteries: PA peak pressure: 55 mm Hg (S) + RAP. - Inferior vena cava: The vessel was dilated. The respirophasic diameter changes were blunted (< 50%), consistent with elevated central venous pressure.  Impressions:  - LVEF 55-60%, mild LVH, diastolic dysfunction with elevated LV filling pressure, mild RAE, moderate TR with RVSP of 55 mmHG + RAP, dilated IVC but in the setting of PPV.   EKG: Independently reviewed. Ordered.   Assessment/Plan Principal Problem:   Bradycardia Admit for telemetry monitoring. Twelve-lead EKG ordered and still pending. Serial troponin levels. Discontinue clonidine. Discontinue Seroquel since it is related to bradycardia particularly in elderly patients. Cardiology has been consulted by the emergency department.  Active Problems:    Hyponatremia The patient was previously on furosemide, but is not currently taking. This could also be related to increase output from ileostomy.      Alzheimer's disease Continue  Aricept and supportive care. Use benzodiazepines as needed in case of restlessness.    Type 2 diabetes mellitus (HCC) Continue slow acting insulin. Regular insulin sliding scale with CBG monitoring.    Chronic kidney disease Monitor input and output. Monitor electrolytes, BUN and creatinine.    Hyperkalemia Continue IV hydration and follow-up level in the morning.    Hyperlipidemia Continue atorvastatin. Monitor LFTs periodically.    Essential hypertension Continue amlodipine and hydralazine. Hold clonidine due to bradycardia. Monitor blood pressure.     Cardiology was consulted by the emergency department.  Code Status: Full code. DVT Prophylaxis: Lovenox SQ. Family  Communication:  Disposition Plan: Admit to telemetry for evaluation by cardiology.  Time spent: Over 70 minutes were spent during the process of this admission.  Reubin Milan [facility] Pager [phone].

## 2014-11-01 DIAGNOSIS — N184 Chronic kidney disease, stage 4 (severe): Secondary | ICD-10-CM

## 2014-11-01 DIAGNOSIS — R001 Bradycardia, unspecified: Secondary | ICD-10-CM | POA: Diagnosis not present

## 2014-11-01 DIAGNOSIS — F0280 Dementia in other diseases classified elsewhere without behavioral disturbance: Secondary | ICD-10-CM

## 2014-11-01 DIAGNOSIS — G308 Other Alzheimer's disease: Secondary | ICD-10-CM

## 2014-11-01 DIAGNOSIS — I10 Essential (primary) hypertension: Secondary | ICD-10-CM

## 2014-11-01 DIAGNOSIS — E785 Hyperlipidemia, unspecified: Secondary | ICD-10-CM

## 2014-11-01 DIAGNOSIS — E871 Hypo-osmolality and hyponatremia: Secondary | ICD-10-CM

## 2014-11-01 LAB — COMPREHENSIVE METABOLIC PANEL
ALK PHOS: 60 U/L (ref 38–126)
ALT: 15 U/L — AB (ref 17–63)
AST: 18 U/L (ref 15–41)
Albumin: 2.9 g/dL — ABNORMAL LOW (ref 3.5–5.0)
Anion gap: 10 (ref 5–15)
BUN: 59 mg/dL — AB (ref 6–20)
CALCIUM: 8.8 mg/dL — AB (ref 8.9–10.3)
CHLORIDE: 101 mmol/L (ref 101–111)
CO2: 15 mmol/L — AB (ref 22–32)
CREATININE: 2.3 mg/dL — AB (ref 0.61–1.24)
GFR, EST AFRICAN AMERICAN: 32 mL/min — AB (ref >60)
GFR, EST NON AFRICAN AMERICAN: 27 mL/min — AB (ref >60)
Glucose, Bld: 113 mg/dL — ABNORMAL HIGH (ref 65–99)
Potassium: 4.7 mmol/L (ref 3.5–5.1)
Sodium: 126 mmol/L — ABNORMAL LOW (ref 135–145)
Total Bilirubin: 0.5 mg/dL (ref 0.3–1.2)
Total Protein: 6.8 g/dL (ref 6.5–8.1)

## 2014-11-01 LAB — CBC
HCT: 24.6 % — ABNORMAL LOW (ref 39.0–52.0)
HCT: 26.1 % — ABNORMAL LOW (ref 39.0–52.0)
Hemoglobin: 8.2 g/dL — ABNORMAL LOW (ref 13.0–17.0)
Hemoglobin: 8.6 g/dL — ABNORMAL LOW (ref 13.0–17.0)
MCH: 25.3 pg — AB (ref 26.0–34.0)
MCH: 25.1 pg — AB (ref 26.0–34.0)
MCHC: 33.3 g/dL (ref 30.0–36.0)
MCHC: 33 g/dL (ref 30.0–36.0)
MCV: 75.9 fL — AB (ref 78.0–100.0)
MCV: 76.1 fL — AB (ref 78.0–100.0)
PLATELETS: 155 10*3/uL (ref 150–400)
PLATELETS: 169 10*3/uL (ref 150–400)
RBC: 3.24 MIL/uL — ABNORMAL LOW (ref 4.22–5.81)
RBC: 3.43 MIL/uL — AB (ref 4.22–5.81)
RDW: 15.7 % — ABNORMAL HIGH (ref 11.5–15.5)
RDW: 15.5 % (ref 11.5–15.5)
WBC: 4.3 10*3/uL (ref 4.0–10.5)
WBC: 3.7 10*3/uL — AB (ref 4.0–10.5)

## 2014-11-01 LAB — TROPONIN I: Troponin I: <0.03 ng/mL (ref <0.031)

## 2014-11-01 LAB — CREATININE, SERUM
CREATININE: 2.52 mg/dL — AB (ref 0.61–1.24)
GFR calc Af Amer: 28 mL/min — ABNORMAL LOW (ref >60)
GFR calc non Af Amer: 24 mL/min — ABNORMAL LOW (ref >60)

## 2014-11-01 LAB — GLUCOSE, CAPILLARY
GLUCOSE-CAPILLARY: 148 mg/dL — AB (ref 65–99)
Glucose-Capillary: 123 mg/dL — ABNORMAL HIGH (ref 65–99)
Glucose-Capillary: 106 mg/dL — ABNORMAL HIGH (ref 65–99)

## 2014-11-01 LAB — TSH: TSH: 2.433 u[IU]/mL (ref 0.350–4.500)

## 2014-11-01 LAB — PHOSPHORUS: PHOSPHORUS: 5.4 mg/dL — AB (ref 2.5–4.6)

## 2014-11-01 LAB — MAGNESIUM: MAGNESIUM: 2 mg/dL (ref 1.7–2.4)

## 2014-11-01 LAB — MRSA PCR SCREENING: MRSA by PCR: NEGATIVE

## 2014-11-01 MED ORDER — ASPIRIN EC 81 MG PO TBEC
81.0000 mg | DELAYED_RELEASE_TABLET | Freq: Every day | ORAL | Status: DC
  Administered 2014-11-01: 81 mg via ORAL
  Filled 2014-11-01: qty 1

## 2014-11-01 MED ORDER — SERTRALINE HCL 50 MG PO TABS
150.0000 mg | ORAL_TABLET | Freq: Every day | ORAL | Status: DC
  Administered 2014-11-01: 150 mg via ORAL
  Filled 2014-11-01 (×2): qty 1

## 2014-11-01 MED ORDER — ENOXAPARIN SODIUM 30 MG/0.3ML ~~LOC~~ SOLN
30.0000 mg | SUBCUTANEOUS | Status: DC
  Administered 2014-11-01: 30 mg via SUBCUTANEOUS
  Filled 2014-11-01: qty 0.3

## 2014-11-01 MED ORDER — PRO-STAT SUGAR FREE PO LIQD
30.0000 mL | Freq: Two times a day (BID) | ORAL | Status: DC
  Administered 2014-11-01: 30 mL via ORAL
  Filled 2014-11-01: qty 30

## 2014-11-01 MED ORDER — INSULIN ASPART 100 UNIT/ML ~~LOC~~ SOLN
0.0000 [IU] | Freq: Three times a day (TID) | SUBCUTANEOUS | Status: DC
  Administered 2014-11-01: 1 [IU] via SUBCUTANEOUS

## 2014-11-01 MED ORDER — SERTRALINE HCL 25 MG PO TABS: 75.0000 mg | ORAL_TABLET | Freq: Every day | ORAL | Status: DC

## 2014-11-01 MED ORDER — ADULT MULTIVITAMIN LIQUID CH
5.0000 mL | Freq: Every day | ORAL | Status: DC
Start: 2014-11-01 — End: 2014-11-01
  Administered 2014-11-01: 5 mL via ORAL
  Filled 2014-11-01: qty 5

## 2014-11-01 MED ORDER — ATORVASTATIN CALCIUM 20 MG PO TABS
20.0000 mg | ORAL_TABLET | Freq: Every day | ORAL | Status: DC
Start: 2014-11-01 — End: 2014-11-01
  Administered 2014-11-01: 20 mg via ORAL
  Filled 2014-11-01: qty 1

## 2014-11-01 MED ORDER — AMLODIPINE BESYLATE 10 MG PO TABS
10.0000 mg | ORAL_TABLET | Freq: Every day | ORAL | Status: DC
  Administered 2014-11-01: 10 mg via ORAL
  Filled 2014-11-01: qty 1

## 2014-11-01 MED ORDER — VITAMIN C 500 MG PO TABS
1000.0000 mg | ORAL_TABLET | Freq: Two times a day (BID) | ORAL | Status: DC
  Administered 2014-11-01: 1000 mg via ORAL
  Filled 2014-11-01: qty 2

## 2014-11-01 MED ORDER — HYDRALAZINE HCL 50 MG PO TABS: 50.0000 mg | ORAL_TABLET | Freq: Three times a day (TID) | ORAL | Status: DC

## 2014-11-01 MED ORDER — TRAZODONE HCL 50 MG PO TABS
25.0000 mg | ORAL_TABLET | Freq: Two times a day (BID) | ORAL | Status: DC
  Administered 2014-11-01: 25 mg via ORAL
  Filled 2014-11-01: qty 1

## 2014-11-01 MED ORDER — SODIUM CHLORIDE 0.9 % IJ SOLN
3.0000 mL | Freq: Two times a day (BID) | INTRAMUSCULAR | Status: DC
  Administered 2014-11-01: 3 mL via INTRAVENOUS

## 2014-11-01 MED ORDER — CYANOCOBALAMIN 1000 MCG/ML IJ SOLN: 1000.0000 ug | INTRAMUSCULAR | Status: DC

## 2014-11-01 MED ORDER — ENOXAPARIN SODIUM 40 MG/0.4ML ~~LOC~~ SOLN: 40.0000 mg | SUBCUTANEOUS | Status: DC

## 2014-11-01 MED ORDER — PANTOPRAZOLE SODIUM 40 MG IV SOLR
40.0000 mg | INTRAVENOUS | Status: DC
  Administered 2014-11-01: 40 mg via INTRAVENOUS
  Filled 2014-11-01: qty 40

## 2014-11-01 MED ORDER — FERROUS SULFATE 300 (60 FE) MG/5ML PO SYRP
300.0000 mg | ORAL_SOLUTION | Freq: Every day | ORAL | Status: DC
  Administered 2014-11-01: 300 mg
  Filled 2014-11-01: qty 5

## 2014-11-01 MED ORDER — INSULIN GLARGINE 100 UNIT/ML ~~LOC~~ SOLN
20.0000 [IU] | Freq: Every day | SUBCUTANEOUS | Status: DC
  Administered 2014-11-01: 20 [IU] via SUBCUTANEOUS
  Filled 2014-11-01 (×2): qty 0.2

## 2014-11-01 MED ORDER — TAMSULOSIN HCL 0.4 MG PO CAPS: 0.4000 mg | ORAL_CAPSULE | Freq: Every day | ORAL | Status: DC

## 2014-11-01 MED ORDER — DONEPEZIL HCL 10 MG PO TABS: 10.0000 mg | ORAL_TABLET | Freq: Every day | ORAL | Status: DC

## 2014-11-01 NOTE — Consult Note (Addendum)
WOC wound consult note Reason for Consult: Consult requested for ostomy assistance.  Pt is confused and is unable to state if he is independent with pouching activities prior to admission.  He will need total assistance with pouch application and emptying while in the hospital.  He does not have any supplies with his belongings. Ostomy has been in place to RLQ since 2009, according to the EMR. Current pouch intact with good seal.  Mod amt tan liquid stool in pouch, stoma red and viable when visualized through pouch.  He is wearing a one piece appliance with a clamp; we do not carry this brand in the [place].  Supplies ordered to bedside for staff nurse use with a similar flexible one piece pouch. Please re-consult if further assistance is needed.  Thank-you,  Julien Girt MSN, [place], [place], [place], [place]

## 2014-11-01 NOTE — Progress Notes (Signed)
Pt has been discharged back to [facility]. RN given report. [name] has been notified

## 2014-11-01 NOTE — Progress Notes (Signed)
Initial Nutrition Assessment  DOCUMENTATION CODES:   Obesity unspecified  INTERVENTION:    If TF to be resumed in the hospital, recommend Osmolite 1.2 via PEG, 360 ml QID with Prostat 30 ml TID to provide 2028 kcals, 125 gm protein, 1181 ml free water daily.  NUTRITION DIAGNOSIS:   Inadequate oral intake related to inability to eat, dysphagia as evidenced by NPO status.  GOAL:   Patient will meet greater than or equal to 90% of their needs  MONITOR:   TF tolerance, Labs, Weight trends, I & O's  REASON FOR ASSESSMENT:   Malnutrition Screening Tool    ASSESSMENT:   69 y.o. male with below extensive past medical history who was brought to the ED via EMS, referred by from [facility] living facility for evaluation of bradycardia.   Labs reviewed: sodium low, phosphorus elevated.  Per discussion with RN, patient has a PEG tube, looks old. Per nursing facility records, patient was receiving bolus TF with Isosource HN formula PTA. Plans for d/c back to SNF today, no plans to resume TF until returned to SNF. Unsure if patient is able to take PO's with hx of dysphagia. Unable to complete Nutrition-Focused physical exam at this time.   Diet Order:  Diet NPO time specified  Skin:  Wound (see comment) (abdominal incision; diabetic ulcer to left toe)  Last BM:  10/21  Height:   Ht Readings from Last 1 Encounters:  10/31/14 5\' 9"  (1.753 m)    Weight:   Wt Readings from Last 1 Encounters:  10/31/14 226 lb 11.2 oz (102.83 kg)    Ideal Body Weight:  72.7 kg  BMI:  Body mass index is 33.46 kg/(m^2).  Estimated Nutritional Needs:   Kcal:  1900-2100  Protein:  120-130 gm  Fluid:  >/= 2 L  EDUCATION NEEDS:   No education needs identified at this time  Molli Barrows, [place], [place], [place] Pager [phone] After Hours Pager [phone]

## 2014-11-01 NOTE — Consult Note (Signed)
Patient ID: [name] MRN: [id number], DOB/AGE: October 31, 1945   Admit date: 10/31/2014   Primary Physician: No PCP Per Patient Primary Cardiologist: Dr. Debara Pickett  Pt. Profile:   69 y.o. male nursing home resident with a PMH of Alzheimer's Disease, Chronic Diastolic CHF, Type II Diabetes Mellitus, HTN, HLD, Colon Cancer (s/p colectomy 2009, has PEG tube and colostomy), strokes, and CKD Stage 3, admitted for bradycardia.   Problem List  Past Medical History  Diagnosis Date  . Hyponatremia   . Hypokalemia   . Anemia   . Diabetes mellitus without complication ([place])   . Hypertension   . UTI (lower urinary tract infection)   . Delusional disorder ([place])   . HOH (hard of hearing)   . Hyperlipidemia   . CHF (congestive heart failure) ([place])   . Renal insufficiency   . Stroke ([place])   . Dementia   . Depression   . Dysphagia   . Falls frequently   . Colon cancer ([place]) 2009    s/p Colectomy  . Alzheimer's dementia     Past Surgical History  Procedure Laterality Date  . Colon surgery    . Colectomy  09/06/2007  . Ileostomy    . Peg tube removal    . Cardiac catheterization    . Cholecystectomy    . Abdominal adhesion surgery       Allergies  No Known Allergies  HPI  Note: Patient with Alzheimer's. Due to mental status, unable to obtain history from the patient. Majority of history below was obtained by chart review.   69 y.o. male with PMH of Alzheimer's Disease, Chronic Diastolic CHF, Type II Diabetes Mellitus, HTN, HLD, Colon Cancer (s/p colectomy 2009, has PEG tube and colostomy), strokes, and CKD Stage 3. He resides at a nursing facility. In 2009, he underwent a LHC showing LAD with 15-20% proximal and mid stenosis, left circumflex with 5-10% proximal stenosis, RCA with 20-25% mid stenosis.  He was recently admitted on 09/20/14 for acute hypoxic respiratory failure, in the setting of HCAP and acute diastolic CHF. Echo 9/9 showed normal LVEF of 55-60% with mild LVH  and grade 1 diastolic dysfunction. He responded well to IV lasix and antibiotics. He also had slight troponin elevation ~0.29-0.30. However this was felt to be secondary to demand ischemia., thus no w/u was pursued. From a cardiac standpoint, he was discharged on 40 mg of PO lasix daily, ASA and hydralazine for BP. His Norvasc was held.   He presented back to [facility] on the night of 10/31/14 with bradycardia. Per EMS report, patient had a HR in the 40s at SNF (baseline HR ~60s). His EKG on arrival shows a rate of 39 bpm. The computer read rhythm as atrial fibrillation, but there is ?P wave noted in lead 2 and rhythm looks regular. Telemetry shows sinus brady with a HR in the upper 50s. Lowest HR on telemetry was 49 bpm. No pauses or blocks. No atrial fibrillation. K is normal at 4.7. Mg normal at 2.0. TSH also normal. Troponin negative x 1. F/u enzymes pending.   He was admitted by IM. Both his clonidine and Seroquel are on hold given potential side effects of bradycardia.  HR improved in the upper 50s.   Home Medications  Prior to Admission medications   Medication Sig Start Date End Date Taking? Authorizing Provider  Amino Acids-Protein Hydrolys (FEEDING SUPPLEMENT, PRO-STAT SUGAR FREE 64,) LIQD Take 30 mLs by mouth 2 (two) times daily.   Yes Historical  Provider, MD  amLODipine (NORVASC) 10 MG tablet Take 10 mg by mouth daily.   Yes Historical Provider, MD  aspirin 81 MG chewable tablet Give 81 mg by tube daily.   Yes Historical Provider, MD  atorvastatin (LIPITOR) 20 MG tablet Give 20 mg by tube daily.   Yes Historical Provider, MD  cloNIDine (CATAPRES) 0.2 MG tablet Take 0.2 mg by mouth See admin instructions. Take one tablet by mouth once a week. Take on Saturdays per [facility]   Yes Historical Provider, MD  cyanocobalamin (,VITAMIN B-12,) 1000 MCG/ML injection Inject 1,000 mcg into the muscle every 30 (thirty) days.   Yes Historical Provider, MD  donepezil (ARICEPT) 10 MG tablet Give 10 mg by tube at  bedtime.    Yes Historical Provider, MD  ferrous sulfate 220 (44 FE) MG/5ML solution Place 330 mg into feeding tube daily.   Yes Historical Provider, MD  hydrALAZINE (APRESOLINE) 50 MG tablet Take 50 mg by mouth 4 (four) times daily - after meals and at bedtime.   Yes Historical Provider, MD  insulin glargine (LANTUS) 100 UNIT/ML injection Inject 20 Units into the skin at bedtime.   Yes Historical Provider, MD  insulin lispro (HUMALOG) 100 UNIT/ML injection Inject 2-12 Units into the skin 4 (four) times daily. MAR has Insulin given 4 times daily with meals. 201-250=2units, 251-300units=4units, 301-350=6units, 351-400=8units, 401-450=10units, >451=12 units   Yes Historical Provider, MD  Multiple Vitamin (MULTIVITAMIN) LIQD Take 5 mLs by mouth daily.   Yes Historical Provider, MD  pantoprazole (PROTONIX) 40 MG injection Inject 40 mg into the vein daily. 09/23/14  Yes Donita Brooks, NP  QUEtiapine (SEROQUEL) 25 MG tablet Take 25 mg by mouth.   Yes Historical Provider, MD  sertraline (ZOLOFT) 100 MG tablet Take 150 mg by mouth daily.   Yes Historical Provider, MD  tamsulosin (FLOMAX) 0.4 MG CAPS capsule Take 0.4 mg by mouth.   Yes Historical Provider, MD  traZODone (DESYREL) 50 MG tablet Take 25 mg by mouth 2 (two) times daily.   Yes Historical Provider, MD  vitamin C (ASCORBIC ACID) 500 MG tablet Give 1,000 mg by tube 2 (two) times daily.    Yes Historical Provider, MD  antiseptic oral rinse (CPC / CETYLPYRIDINIUM CHLORIDE 0.05%) 0.05 % LIQD solution 7 mLs by Mouth Rinse route 2 (two) times daily. Patient not taking: Reported on 10/31/2014 09/23/14   Donita Brooks, NP  cefTAZidime 2 g in dextrose 5 % 50 mL Inject 2 g into the vein daily. Patient not taking: Reported on 10/31/2014 09/23/14   Donita Brooks, NP  furosemide (LASIX) 20 MG tablet Place 2 tablets (40 mg total) into feeding tube daily. Patient not taking: Reported on 10/31/2014 09/23/14   Donita Brooks, NP  heparin 5000 UNIT/ML injection  Inject 1 mL (5,000 Units total) into the skin every 8 (eight) hours. Patient not taking: Reported on 10/31/2014 09/23/14   Donita Brooks, NP  insulin aspart (NOVOLOG) 100 UNIT/ML injection Inject 0-9 Units into the skin every 4 (four) hours. CBG < 70: implement hypoglycemia protocol CBG 70 - 120: 0 units CBG 121 - 150: 1 unit CBG 151 - 200: 2 units CBG 201 - 250: 3 units CBG 251 - 300: 5 units CBG 301 - 350: 7 units CBG 351 - 400: 9 units CBG > 400: call MD and obtain STAT lab verification Patient not taking: Reported on 10/31/2014 09/23/14   Donita Brooks, NP  Nutritional Supplements (FEEDING SUPPLEMENT, VITAL HIGH PROTEIN,) LIQD  liquid TF at 40 ml/hr Patient not taking: Reported on 10/31/2014 09/23/14   Donita Brooks, NP  QUEtiapine (SEROQUEL) 25 MG tablet Place 1 tablet (25 mg total) into feeding tube at bedtime. Patient not taking: Reported on 10/31/2014 09/23/14   Donita Brooks, NP  vancomycin 1,500 mg in sodium chloride 0.9 % 500 mL Inject 1,500 mg into the vein every other day. Patient not taking: Reported on 10/31/2014 09/23/14   Donita Brooks, NP   . amLODipine  10 mg Oral Daily  . aspirin EC  81 mg Oral Daily  . atorvastatin  20 mg Oral Daily  . [START ON 11/25/2014] cyanocobalamin  1,000 mcg Intramuscular Q30 days  . donepezil  10 mg Oral QHS  . enoxaparin (LOVENOX) injection  30 mg Subcutaneous Q24H  . feeding supplement (PRO-STAT SUGAR FREE 64)  30 mL Oral BID  . ferrous sulfate  300 mg Per Tube Daily  . hydrALAZINE  50 mg Oral TID PC & HS  . insulin aspart  0-9 Units Subcutaneous TID WC  . insulin glargine  20 Units Subcutaneous QHS  . multivitamin  5 mL Oral Daily  . pantoprazole  40 mg Intravenous Q24H  . sertraline  150 mg Oral Daily  . sodium chloride  3 mL Intravenous Q12H  . traZODone  25 mg Oral BID  . vitamin C  1,000 mg Oral BID   . sodium chloride Stopped (11/01/14 0851)   Family History  No family history on file.  Social History  Social History     Social History  . Marital Status: Single    Spouse Name: N/A  . Number of Children: N/A  . Years of Education: N/A   Occupational History  . Not on file.   Social History Main Topics  . Smoking status: Never Smoker   . Smokeless tobacco: Not on file  . Alcohol Use: No  . Drug Use: No  . Sexual Activity: Not Currently   Other Topics Concern  . Not on file   Social History Narrative     Review of Systems General:  No chills, fever, night sweats or weight changes.  Cardiovascular:  No chest pain, dyspnea on exertion, edema, orthopnea, palpitations, paroxysmal nocturnal dyspnea. Dermatological: No rash, lesions/masses Respiratory: No cough, dyspnea Urologic: No hematuria, dysuria Abdominal:   No nausea, vomiting, diarrhea, bright red blood per rectum, melena, or hematemesis Neurologic:  No visual changes, wkns, changes in mental status. All other systems reviewed and are otherwise negative except as noted above.  Physical Exam  Blood pressure 125/55, pulse 52, temperature 97.5 F (36.4 C), temperature source Oral, resp. rate 16, height 5\' 9"  (1.753 m), weight 226 lb 11.2 oz (102.83 kg), SpO2 100 %.  General: Pleasant, NAD, demented Psych: Smiling Neuro: Alert, not oriented to place or time. Moves all extremities spontaneously. HEENT: Normal  Neck: Supple without bruits or JVD. Lungs:  Resp regular and unlabored, CTA. Heart: RRR no s3, s4, or murmurs. Abdomen: Soft, non-tender, non-distended, BS + x 4. Ileostomy RLQ and PEG tube. Extremities: bilateral stasis dermatitis of LE. DP/PT/Radials 2+ and equal bilaterally.  Labs  Troponin [facility] of Care Test)  Recent Labs  10/31/14 2041  TROPIPOC 0.03    Recent Labs  11/01/14 0320  TROPONINI <0.03   Lab Results  Component Value Date   WBC 3.7* 11/01/2014   HGB 8.6* 11/01/2014   HCT 26.1* 11/01/2014   MCV 76.1* 11/01/2014   PLT 169 11/01/2014    Recent  Labs Lab 11/01/14 0555  NA 126*  K 4.7  CL 101   CO2 15*  BUN 59*  CREATININE 2.30*  CALCIUM 8.8*  PROT 6.8  BILITOT 0.5  ALKPHOS 60  ALT 15*  AST 18  GLUCOSE 113*   Lab Results  Component Value Date   CHOL  10/02/2007    102        ATP III CLASSIFICATION:  <200     mg/dL   Desirable  200-239  mg/dL   Borderline High  >=240    mg/dL   High   HDL 44 08/10/2007   LDLCALC  08/10/2007    55        Total Cholesterol/HDL:CHD Risk Coronary Heart Disease Risk Table                     Men   Women  1/2 Average Risk   3.4   3.3   TRIG 177* 10/02/2007   No results found for: DDIMER   Radiology/Studies  No results found.  ECG  Sinus bradycardia 39 bpm Telemetry. Sinus bradycardia. HR 49- upper 50s.   ------------------------------------------------------------------- 09/20/14 ECHOStudy Conclusions  - Left ventricle: The cavity size was normal. Wall thickness was increased in a pattern of mild LVH. Systolic function was normal. The estimated ejection fraction was in the range of 55% to 60%. Wall motion was normal; there were no regional wall motion abnormalities. Doppler parameters are consistent with abnormal left ventricular relaxation (grade 1 diastolic dysfunction). The E/e&' ratio is >15, suggesting elevated LV filling pressure. - Aortic valve: Trileaflet. Sclerosis without stenosis. There was no regurgitation. - Mitral valve: Mildly thickened leaflets . There was no significant regurgitation. - Left atrium: The atrium was at the upper limits of normal in size. - Right ventricle: The cavity size was mildly dilated. - Right atrium: The atrium was mildly dilated. Central venous pressure (est): 15 mm Hg (in the setting of positive pressure ventilation) - Atrial septum: No defect or patent foramen ovale was identified. - Tricuspid valve: There was moderate regurgitation. - Pulmonary arteries: PA peak pressure: 55 mm Hg (S) + RAP. - Inferior vena cava: The vessel was dilated. The  respirophasic diameter changes were blunted (< 50%), consistent with elevated central venous pressure.  Impressions:  - LVEF 55-60%, mild LVH, diastolic dysfunction with elevated LV filling pressure, mild RAE, moderate TR with RVSP of 55 mmHG + RAP, dilated IVC but in the setting of PPV.  ASSESSMENT AND PLAN  Principal Problem:   Bradycardia Active Problems:   Alzheimer's disease   Type 2 diabetes mellitus (HCC)   Chronic kidney disease   Hypopotassemia   Hyperlipidemia   Essential hypertension   Hyponatremia  1. Sinus bradycardia: unable to assess patient symptoms given his baseline mental status. However he does not appear to be in any distress. EKG and telemetry has shown sinus bradycardia. No pauses or blocks. K, Mg, and TSH all WNL. His HR has improved from the 30s/40s to the upper 50s after discontinuation of clonidine and Seroquel. He is not on any other AVN blocking agents. Recommended continuing to hold clonidine to avoid recurrent severe bradycardia. His bradycardia is likely medication induced. In addition, he would not be a candidate for LHC given his baseline mental status, thus would not persue any aggressive w/u.    Signed, Lyda Jester, PA-C 11/01/2014, 9:42 AM    Patient seen and examined. Agree with assessment and plan. Mr. Wilford Merryfield is a 69 year old African-American male  was history of diabetes mellitus, hypertension, Alzheimer's disease, chronic kidney disease, colon cancer, status post colectomy with subsequent ileostomy and PEG tube who was admitted last evening with bradycardia.  A previous echo Doppler study has shown normal systolic function with grade 1 diastolic dysfunction and abnormal tissue Doppler suggesting increased left atrial pressure.  He also was found to have moderate pulmonary hypertension.  The patient was not on any beta blocker therapy or any significant AV nodal blocking drugs.  Since coming into the hospital, his clonidine  as well as Seroquel were held.  His resting pulse is now 59 bpm, and he remains asymptomatic.  Cardiac enzymes are negative.  Laboratory is notable for microcytic anemia, hyponatremia, and borderline stage IV chronic kidney disease.  His blood pressure is currently stable on amlodipine 10 mg and hydralazine 50 mg 3 times a day.  His bradycardia has resolved and at this point, no further inpatient cardiac evaluation is necessary. Consider outpatient cardiac monitoring if recurrent bradycardia develops.   Troy Sine, MD, [facility] 11/01/2014 11:21 AM

## 2014-11-01 NOTE — Discharge Summary (Signed)
Physician Discharge Summary  [name]:[id number] DOB: 08-31-1945 DOA: 10/31/2014  PCP: No PCP Per Patient  Admit date: 10/31/2014 Discharge date: 11/01/2014  Time spent: > 30 minutes  Recommendations for Outpatient Follow-up:  1. Follow up with SNF MD in 1-2 weeks 2. Repeat BMP in 3 days  3. Hold Clonidine and Seroquel on discharge due to bradycardia  Discharge Diagnoses:  Principal Problem:   Bradycardia Active Problems:   Alzheimer's disease   Type 2 diabetes mellitus ([place])   Chronic kidney disease   Hypopotassemia   Hyperlipidemia   Essential hypertension   Hyponatremia  Discharge Condition: stable  Diet recommendation: via PEG tube  Filed Weights   10/31/14 2015 10/31/14 2331  Weight: 99.791 kg (220 lb) 102.83 kg (226 lb 11.2 oz)   History of present illness:  [name] is a 69 y.o. male with below extensive past medical history who was brought to the ED via EMS, referred by from [facility] living facility for evaluation of bradycardia. He is oriented to name and knows he is in a hospital, but he is otherwise disoriented and unable to provide further details. History is taken from records and ED medical staff. When seen the patient was in no acute distress and denied any complaints.  Hospital Course:  Bradycardia - difficult to day whether this was symptomatic given underlying mental status. He was admitted to telemetry and his Clonidine and Seroquel were discontinued. Cardiology was consulted and have followed patient while hospitalized. His bradycardia improved and no further inpatient workup is recommended by cardiology at this point. He has returned to baseline and will be discharged in stable condition.  Hyponatremia - the patient was previously on furosemide, but is not currently taking. This is likely due to dehydration, is improving, repeat BMP in 3 days. Continue tube feeds, please decrease free water flushes by 10%. Will decrease his Zoloft  to half, consider discontinuing as it can contribute Alzheimer's disease - continue Aricept and supportive care. Type 2 diabetes mellitus ([place]) - continue insulin Chronic kidney disease - renal function returning to baseline with hydration.  Hyperkalemia - improved Hyperlipidemia - Continue atorvastatin. Monitor LFTs periodically. Essential hypertension - Continue amlodipine and hydralazine.  Procedures:  None    Consultations:  Cardiology   Discharge Exam: Filed Vitals:   10/31/14 2215 10/31/14 2245 10/31/14 2331 11/01/14 0500  BP: 113/58 121/53 147/59 125/55  Pulse: 40 40 40 52  Temp:   98.2 F (36.8 C) 97.5 F (36.4 C)  TempSrc:   Axillary Oral  Resp: 19 14 16    Height:   5\' 9"  (1.753 m)   Weight:   102.83 kg (226 lb 11.2 oz)   SpO2: 100% 100% 100% 100%   General: NAD Cardiovascular: RRR Respiratory: CTA biL  Discharge Instructions    Medication List    STOP taking these medications        cefTAZidime 2 g in dextrose 5 % 50 mL     cloNIDine 0.2 MG tablet  Commonly known as:  CATAPRES     feeding supplement (VITAL HIGH PROTEIN) Liqd liquid     furosemide 20 MG tablet  Commonly known as:  LASIX     heparin 5000 UNIT/ML injection     insulin aspart 100 UNIT/ML injection  Commonly known as:  novoLOG     QUEtiapine 25 MG tablet  Commonly known as:  SEROQUEL     SEROQUEL 25 MG tablet  Generic drug:  QUEtiapine  vancomycin 1,500 mg in sodium chloride 0.9 % 500 mL      TAKE these medications        amLODipine 10 MG tablet  Commonly known as:  NORVASC  Take 10 mg by mouth daily.     antiseptic oral rinse 0.05 % Liqd solution  Commonly known as:  CPC / CETYLPYRIDINIUM CHLORIDE 0.05%  7 mLs by Mouth Rinse route 2 (two) times daily.     aspirin 81 MG chewable tablet  Give 81 mg by tube daily.     atorvastatin 20 MG tablet  Commonly known as:  LIPITOR  Give 20 mg by tube daily.     cyanocobalamin 1000 MCG/ML injection  Commonly known as:   (VITAMIN B-12)  Inject 1,000 mcg into the muscle every 30 (thirty) days.     donepezil 10 MG tablet  Commonly known as:  ARICEPT  Give 10 mg by tube at bedtime.     feeding supplement (PRO-STAT SUGAR FREE 64) Liqd  Take 30 mLs by mouth 2 (two) times daily.     ferrous sulfate 220 (44 FE) MG/5ML solution  Place 330 mg into feeding tube daily.     hydrALAZINE 50 MG tablet  Commonly known as:  APRESOLINE  Take 50 mg by mouth 4 (four) times daily - after meals and at bedtime.     insulin glargine 100 UNIT/ML injection  Commonly known as:  LANTUS  Inject 20 Units into the skin at bedtime.     insulin lispro 100 UNIT/ML injection  Commonly known as:  HUMALOG  Inject 2-12 Units into the skin 4 (four) times daily. MAR has Insulin given 4 times daily with meals. 201-250=2units, 251-300units=4units, 301-350=6units, 351-400=8units, 401-450=10units, >451=12 units     multivitamin Liqd  Take 5 mLs by mouth daily.     pantoprazole 40 MG injection  Commonly known as:  PROTONIX  Inject 40 mg into the vein daily.     sertraline 25 MG tablet  Commonly known as:  ZOLOFT  Take 3 tablets (75 mg total) by mouth daily.     tamsulosin 0.4 MG Caps capsule  Commonly known as:  FLOMAX  Take 0.4 mg by mouth.     traZODone 50 MG tablet  Commonly known as:  DESYREL  Take 25 mg by mouth 2 (two) times daily.     vitamin C 500 MG tablet  Commonly known as:  ASCORBIC ACID  Give 1,000 mg by tube 2 (two) times daily.           Follow-up Information    Follow up with SNF MD In 1 week.      The results of significant diagnostics from this hospitalization (including imaging, microbiology, ancillary and laboratory) are listed below for reference.    Significant Diagnostic Studies: No results found.  Microbiology: Recent Results (from the past 240 hour(s))  MRSA PCR Screening     Status: None   Collection Time: 10/31/14 11:25 PM  Result Value Ref Range Status   MRSA by PCR NEGATIVE NEGATIVE  Final    Comment:        The GeneXpert MRSA Assay (FDA approved for NASAL specimens only), is one component of a comprehensive MRSA colonization surveillance program. It is not intended to diagnose MRSA infection nor to guide or monitor treatment for MRSA infections.     Labs: Basic Metabolic Panel:  Recent Labs Lab 10/31/14 2043 11/01/14 0022 11/01/14 0320 11/01/14 0555  NA 124*  --   --  126*  K 5.3*  --   --  4.7  CL 99*  --   --  101  CO2  --   --   --  15*  GLUCOSE 133*  --   --  113*  BUN 63*  --   --  59*  CREATININE 2.80*  --  2.52* 2.30*  CALCIUM  --   --   --  8.8*  MG  --  2.0  --   --   PHOS  --   --   --  5.4*   Liver Function Tests:  Recent Labs Lab 11/01/14 0555  AST 18  ALT 15*  ALKPHOS 60  BILITOT 0.5  PROT 6.8  ALBUMIN 2.9*   CBC:  Recent Labs Lab 10/31/14 2043 10/31/14 2044 11/01/14 0320 11/01/14 0555  WBC  --  4.7 4.3 3.7*  HGB 10.2* 9.3* 8.2* 8.6*  HCT 30.0* 27.0* 24.6* 26.1*  MCV  --  75.8* 75.9* 76.1*  PLT  --  150 155 169   Cardiac Enzymes:  Recent Labs Lab 11/01/14 0320 11/01/14 0948  TROPONINI <0.03 <0.03   BNP: BNP (last 3 results)  Recent Labs  09/20/14 0655 09/22/14 0221 09/24/14 0806  BNP 1077.8* 589.5* 160.5*   CBG:  Recent Labs Lab 10/31/14 2328 11/01/14 0210 11/01/14 0712 11/01/14 1120  GLUCAP 126* 148* 123* 106*    Signed:  Bekka Qian  [facility] 11/01/2014, 12:24 PM

## 2014-11-01 NOTE — Clinical Social Work Note (Signed)
Clinical Social Work Assessment  Patient Details  Name: [name] MRN: [id number] Date of Birth: [birth year]  Date of referral:  11/01/14               Reason for consult:  Facility Placement, Discharge Planning                Permission sought to share information with:  Family Supports, Customer service manager Permission granted to share information::  No (Patient disoriented)  Name::     Spoke with Mr. Rayann Heman (patient's brother)  Agency::  [facility]  Relationship::     Contact Information:     Housing/Transportation Living arrangements for the past 2 months:  [place] of Information:  Other (Comment Required) (Brother) Patient Interpreter Needed:  None Criminal Activity/Legal Involvement Pertinent to Current Situation/Hospitalization:  No - Comment as needed Significant Relationships:  Siblings Lives with:  Facility Resident Do you feel safe going back to the place where you live?  Yes Need for family participation in patient care:  Yes (Comment)  Care giving concerns:  No concerns reported by family.   Social Worker assessment / plan:  CSW attempted to reach Apolonio Schneiders (first listed contact). CSW able to reach the patient's brother Mr. Allred. The brother will relay information back to [name]. The brother states that the patient is a long term resident of [facility] and plans for the patient to return at discharge. CSW explained role and process of discharging patient. CSW will assist as appropriate.   Employment status:  Disabled (Comment on whether or not currently receiving Disability) Insurance information:  Medicare PT Recommendations:  [place] / Referral to community resources:  [place]  Patient/Family's Response to care:  Family appears happy with the care the patient has received.   Patient/Family's Understanding of and Emotional Response to Diagnosis, Current Treatment, and  Prognosis:  Patients family would like updates regarding patients plan of care and his current condition. CSW provided unit number to the brother and asked that he contact the patient's RN for updates. Family understands that the patient will need to return to [facility] at [place].   Emotional Assessment Appearance:  Appears stated age Attitude/Demeanor/Rapport:  Unable to Assess Affect (typically observed):  Unable to Assess Orientation:  Oriented to Self Alcohol / Substance use:  Never Used Psych involvement (Current and /or in the community):  No (Comment)  Discharge Needs  Concerns to be addressed:  Discharge Planning Concerns Readmission within the last 30 days:  No Current discharge risk:  Chronically ill, Physical Impairment, Cognitively Impaired Barriers to Discharge:  Continued Medical Work up   [employer] MSW, [place], [place], [phone]

## 2014-11-01 NOTE — Clinical Social Work Note (Signed)
Per MD patient ready to DC back to [facility], patient/family [name], multiple attempts made to reach [name]), and facility notified of patient's DC. RN given number for report. DC packet on patient's chart. Ambulance transport requested for patient. CSW signing off at this time.   Liz Beach MSW, [place], [place], [phone]

## 2015-01-02 ENCOUNTER — Inpatient Hospital Stay (HOSPITAL_COMMUNITY)
Admission: EM | Admit: 2015-01-02 | Discharge: 2015-01-07 | DRG: 682 | Disposition: A | Discharge status: Skilled Nursing Facility | Payer: Medicare Other | Attending: Internal Medicine | Admitting: Internal Medicine

## 2015-01-02 ENCOUNTER — Encounter (HOSPITAL_COMMUNITY): Payer: Self-pay | Admitting: Emergency Medicine

## 2015-01-02 ENCOUNTER — Emergency Department (HOSPITAL_COMMUNITY): Payer: Medicare Other

## 2015-01-02 ENCOUNTER — Inpatient Hospital Stay (HOSPITAL_COMMUNITY): Payer: Medicare Other

## 2015-01-02 DIAGNOSIS — R7989 Other specified abnormal findings of blood chemistry: Secondary | ICD-10-CM | POA: Diagnosis not present

## 2015-01-02 DIAGNOSIS — E785 Hyperlipidemia, unspecified: Secondary | ICD-10-CM | POA: Diagnosis present

## 2015-01-02 DIAGNOSIS — N184 Chronic kidney disease, stage 4 (severe): Secondary | ICD-10-CM | POA: Diagnosis present

## 2015-01-02 DIAGNOSIS — G301 Alzheimer's disease with late onset: Secondary | ICD-10-CM | POA: Diagnosis not present

## 2015-01-02 DIAGNOSIS — J189 Pneumonia, unspecified organism: Secondary | ICD-10-CM

## 2015-01-02 DIAGNOSIS — H9190 Unspecified hearing loss, unspecified ear: Secondary | ICD-10-CM | POA: Diagnosis present

## 2015-01-02 DIAGNOSIS — F0280 Dementia in other diseases classified elsewhere without behavioral disturbance: Secondary | ICD-10-CM | POA: Diagnosis not present

## 2015-01-02 DIAGNOSIS — N183 Chronic kidney disease, stage 3 unspecified: Secondary | ICD-10-CM | POA: Diagnosis present

## 2015-01-02 DIAGNOSIS — G309 Alzheimer's disease, unspecified: Secondary | ICD-10-CM | POA: Diagnosis present

## 2015-01-02 DIAGNOSIS — Z85038 Personal history of other malignant neoplasm of large intestine: Secondary | ICD-10-CM

## 2015-01-02 DIAGNOSIS — I10 Essential (primary) hypertension: Secondary | ICD-10-CM | POA: Diagnosis present

## 2015-01-02 DIAGNOSIS — I5033 Acute on chronic diastolic (congestive) heart failure: Secondary | ICD-10-CM | POA: Diagnosis present

## 2015-01-02 DIAGNOSIS — F0281 Dementia in other diseases classified elsewhere with behavioral disturbance: Secondary | ICD-10-CM | POA: Diagnosis present

## 2015-01-02 DIAGNOSIS — R748 Abnormal levels of other serum enzymes: Secondary | ICD-10-CM | POA: Diagnosis present

## 2015-01-02 DIAGNOSIS — Z8673 Personal history of transient ischemic attack (TIA), and cerebral infarction without residual deficits: Secondary | ICD-10-CM | POA: Diagnosis not present

## 2015-01-02 DIAGNOSIS — Z515 Encounter for palliative care: Secondary | ICD-10-CM | POA: Diagnosis present

## 2015-01-02 DIAGNOSIS — I509 Heart failure, unspecified: Secondary | ICD-10-CM

## 2015-01-02 DIAGNOSIS — D649 Anemia, unspecified: Secondary | ICD-10-CM | POA: Diagnosis present

## 2015-01-02 DIAGNOSIS — Z7982 Long term (current) use of aspirin: Secondary | ICD-10-CM | POA: Diagnosis not present

## 2015-01-02 DIAGNOSIS — E119 Type 2 diabetes mellitus without complications: Secondary | ICD-10-CM

## 2015-01-02 DIAGNOSIS — E1122 Type 2 diabetes mellitus with diabetic chronic kidney disease: Secondary | ICD-10-CM | POA: Diagnosis present

## 2015-01-02 DIAGNOSIS — N179 Acute kidney failure, unspecified: Secondary | ICD-10-CM | POA: Diagnosis present

## 2015-01-02 DIAGNOSIS — Z9049 Acquired absence of other specified parts of digestive tract: Secondary | ICD-10-CM | POA: Diagnosis not present

## 2015-01-02 DIAGNOSIS — E876 Hypokalemia: Secondary | ICD-10-CM | POA: Diagnosis present

## 2015-01-02 DIAGNOSIS — R531 Weakness: Secondary | ICD-10-CM | POA: Diagnosis not present

## 2015-01-02 DIAGNOSIS — R1312 Dysphagia, oropharyngeal phase: Secondary | ICD-10-CM | POA: Diagnosis present

## 2015-01-02 DIAGNOSIS — Z931 Gastrostomy status: Secondary | ICD-10-CM

## 2015-01-02 DIAGNOSIS — Z79899 Other long term (current) drug therapy: Secondary | ICD-10-CM | POA: Diagnosis not present

## 2015-01-02 DIAGNOSIS — E872 Acidosis: Secondary | ICD-10-CM | POA: Diagnosis present

## 2015-01-02 DIAGNOSIS — Z932 Ileostomy status: Secondary | ICD-10-CM | POA: Diagnosis not present

## 2015-01-02 DIAGNOSIS — Z7189 Other specified counseling: Secondary | ICD-10-CM | POA: Diagnosis not present

## 2015-01-02 DIAGNOSIS — R627 Adult failure to thrive: Secondary | ICD-10-CM | POA: Diagnosis present

## 2015-01-02 DIAGNOSIS — I5032 Chronic diastolic (congestive) heart failure: Secondary | ICD-10-CM | POA: Diagnosis present

## 2015-01-02 DIAGNOSIS — E870 Hyperosmolality and hypernatremia: Secondary | ICD-10-CM | POA: Diagnosis present

## 2015-01-02 DIAGNOSIS — R609 Edema, unspecified: Secondary | ICD-10-CM | POA: Diagnosis present

## 2015-01-02 DIAGNOSIS — R778 Other specified abnormalities of plasma proteins: Secondary | ICD-10-CM

## 2015-01-02 DIAGNOSIS — N189 Chronic kidney disease, unspecified: Secondary | ICD-10-CM

## 2015-01-02 HISTORY — DX: Unspecified atrial fibrillation: I48.91

## 2015-01-02 LAB — URINALYSIS, ROUTINE W REFLEX MICROSCOPIC
Bilirubin Urine: NEGATIVE
Glucose, UA: NEGATIVE mg/dL
HGB URINE DIPSTICK: NEGATIVE
Ketones, ur: NEGATIVE mg/dL
NITRITE: NEGATIVE
Protein, ur: NEGATIVE mg/dL
Specific Gravity, Urine: 1.013 (ref 1.005–1.030)
pH: 5 (ref 5.0–8.0)

## 2015-01-02 LAB — CBC WITH DIFFERENTIAL/PLATELET
BASOS ABS: 0 10*3/uL (ref 0.0–0.1)
BASOS PCT: 0 %
EOS ABS: 0.1 10*3/uL (ref 0.0–0.7)
EOS PCT: 0 %
HCT: 28.6 % — ABNORMAL LOW (ref 39.0–52.0)
Hemoglobin: 9.2 g/dL — ABNORMAL LOW (ref 13.0–17.0)
Lymphocytes Relative: 4 %
Lymphs Abs: 0.6 10*3/uL — ABNORMAL LOW (ref 0.7–4.0)
MCH: 26 pg (ref 26.0–34.0)
MCHC: 32.2 g/dL (ref 30.0–36.0)
MCV: 80.8 fL (ref 78.0–100.0)
Monocytes Absolute: 1.7 10*3/uL — ABNORMAL HIGH (ref 0.1–1.0)
Monocytes Relative: 13 %
NEUTROS PCT: 83 %
Neutro Abs: 11.1 10*3/uL — ABNORMAL HIGH (ref 1.7–7.7)
PLATELETS: 208 10*3/uL (ref 150–400)
RBC: 3.54 MIL/uL — AB (ref 4.22–5.81)
RDW: 17.2 % — ABNORMAL HIGH (ref 11.5–15.5)
WBC: 13.3 10*3/uL — AB (ref 4.0–10.5)

## 2015-01-02 LAB — MRSA PCR SCREENING: MRSA by PCR: NEGATIVE

## 2015-01-02 LAB — TROPONIN I
TROPONIN I: 0.23 ng/mL — AB (ref <0.031)
TROPONIN I: 0.22 ng/mL — AB (ref <0.031)

## 2015-01-02 LAB — COMPREHENSIVE METABOLIC PANEL
ALK PHOS: 173 U/L — AB (ref 38–126)
ALT: 88 U/L — ABNORMAL HIGH (ref 17–63)
ANION GAP: 16 — AB (ref 5–15)
AST: 61 U/L — ABNORMAL HIGH (ref 15–41)
Albumin: 3.6 g/dL (ref 3.5–5.0)
BILIRUBIN TOTAL: 0.7 mg/dL (ref 0.3–1.2)
BUN: 104 mg/dL — ABNORMAL HIGH (ref 6–20)
CALCIUM: 9.9 mg/dL (ref 8.9–10.3)
CO2: 18 mmol/L — ABNORMAL LOW (ref 22–32)
Chloride: 113 mmol/L — ABNORMAL HIGH (ref 101–111)
Creatinine, Ser: 4.22 mg/dL — ABNORMAL HIGH (ref 0.61–1.24)
GFR calc non Af Amer: 13 mL/min — ABNORMAL LOW (ref >60)
GFR, EST AFRICAN AMERICAN: 15 mL/min — AB (ref >60)
Glucose, Bld: 153 mg/dL — ABNORMAL HIGH (ref 65–99)
POTASSIUM: 4.7 mmol/L (ref 3.5–5.1)
SODIUM: 147 mmol/L — AB (ref 135–145)
TOTAL PROTEIN: 7.6 g/dL (ref 6.5–8.1)

## 2015-01-02 LAB — BRAIN NATRIURETIC PEPTIDE: B Natriuretic Peptide: 1544.1 pg/mL — ABNORMAL HIGH (ref 0.0–100.0)

## 2015-01-02 LAB — GLUCOSE, CAPILLARY
GLUCOSE-CAPILLARY: 108 mg/dL — AB (ref 65–99)
Glucose-Capillary: 120 mg/dL — ABNORMAL HIGH (ref 65–99)

## 2015-01-02 LAB — URINE MICROSCOPIC-ADD ON

## 2015-01-02 MED ORDER — INSULIN ASPART 100 UNIT/ML ~~LOC~~ SOLN
0.0000 [IU] | Freq: Three times a day (TID) | SUBCUTANEOUS | Status: DC
  Administered 2015-01-03: 2 [IU] via SUBCUTANEOUS
  Administered 2015-01-03 – 2015-01-04 (×4): 1 [IU] via SUBCUTANEOUS
  Administered 2015-01-05 – 2015-01-07 (×6): 2 [IU] via SUBCUTANEOUS
  Administered 2015-01-07: 1 [IU] via SUBCUTANEOUS

## 2015-01-02 MED ORDER — SODIUM CHLORIDE 0.9 % IJ SOLN
3.0000 mL | Freq: Two times a day (BID) | INTRAMUSCULAR | Status: DC
  Administered 2015-01-02 – 2015-01-06 (×6): 3 mL via INTRAVENOUS

## 2015-01-02 MED ORDER — DONEPEZIL HCL 10 MG PO TABS
10.0000 mg | ORAL_TABLET | Freq: Every day | ORAL | Status: DC
  Administered 2015-01-02: 10 mg via ORAL
  Filled 2015-01-02: qty 1
  Filled 2015-01-02: qty 2
  Filled 2015-01-02: qty 1

## 2015-01-02 MED ORDER — HEPARIN SODIUM (PORCINE) 5000 UNIT/ML IJ SOLN
5000.0000 [IU] | Freq: Three times a day (TID) | INTRAMUSCULAR | Status: DC
  Administered 2015-01-02 – 2015-01-07 (×15): 5000 [IU] via SUBCUTANEOUS
  Filled 2015-01-02 (×15): qty 1

## 2015-01-02 MED ORDER — HYDRALAZINE HCL 50 MG PO TABS
50.0000 mg | ORAL_TABLET | Freq: Three times a day (TID) | ORAL | Status: DC
  Administered 2015-01-02 – 2015-01-07 (×20): 50 mg via ORAL
  Filled 2015-01-02 (×20): qty 1

## 2015-01-02 MED ORDER — AMLODIPINE BESYLATE 10 MG PO TABS
10.0000 mg | ORAL_TABLET | Freq: Every day | ORAL | Status: DC
  Administered 2015-01-03 – 2015-01-07 (×5): 10 mg via ORAL
  Filled 2015-01-02 (×5): qty 1

## 2015-01-02 MED ORDER — FUROSEMIDE 10 MG/ML IJ SOLN
80.0000 mg | Freq: Three times a day (TID) | INTRAMUSCULAR | Status: DC
  Administered 2015-01-02 – 2015-01-03 (×3): 80 mg via INTRAVENOUS
  Filled 2015-01-02 (×3): qty 8

## 2015-01-02 MED ORDER — SERTRALINE HCL 50 MG PO TABS
150.0000 mg | ORAL_TABLET | Freq: Every day | ORAL | Status: DC
  Administered 2015-01-03 – 2015-01-07 (×5): 150 mg
  Filled 2015-01-02 (×5): qty 1

## 2015-01-02 MED ORDER — INSULIN GLARGINE 100 UNIT/ML ~~LOC~~ SOLN
20.0000 [IU] | Freq: Every day | SUBCUTANEOUS | Status: DC
  Administered 2015-01-02 – 2015-01-06 (×5): 20 [IU] via SUBCUTANEOUS
  Filled 2015-01-02 (×6): qty 0.2

## 2015-01-02 MED ORDER — INSULIN ASPART 100 UNIT/ML ~~LOC~~ SOLN: 0.0000 [IU] | Freq: Every day | SUBCUTANEOUS | Status: DC

## 2015-01-02 MED ORDER — FUROSEMIDE 10 MG/ML IJ SOLN
80.0000 mg | Freq: Once | INTRAMUSCULAR | Status: AC
  Administered 2015-01-02: 80 mg via INTRAVENOUS
  Filled 2015-01-02: qty 8

## 2015-01-02 MED ORDER — TAMSULOSIN HCL 0.4 MG PO CAPS
0.4000 mg | ORAL_CAPSULE | Freq: Every day | ORAL | Status: DC
  Administered 2015-01-03 – 2015-01-07 (×5): 0.4 mg via ORAL
  Filled 2015-01-02 (×5): qty 1

## 2015-01-02 MED ORDER — ATORVASTATIN CALCIUM 20 MG PO TABS
20.0000 mg | ORAL_TABLET | Freq: Every day | ORAL | Status: DC
  Administered 2015-01-03 – 2015-01-07 (×5): 20 mg via ORAL
  Filled 2015-01-02: qty 1
  Filled 2015-01-02 (×2): qty 2
  Filled 2015-01-02: qty 1
  Filled 2015-01-02: qty 2
  Filled 2015-01-02 (×3): qty 1
  Filled 2015-01-02 (×2): qty 2

## 2015-01-02 MED ORDER — ASPIRIN 81 MG PO CHEW
81.0000 mg | CHEWABLE_TABLET | Freq: Every day | ORAL | Status: DC
  Administered 2015-01-03 – 2015-01-07 (×5): 81 mg via ORAL
  Filled 2015-01-02 (×5): qty 1

## 2015-01-02 NOTE — ED Notes (Signed)
MD at bedside. 

## 2015-01-02 NOTE — ED Notes (Signed)
Pt can go to floor at 15:15, [place] 

## 2015-01-02 NOTE — ED Notes (Signed)
Pt BIB EMS from [facility]; pt was diagnosed with pneumonia yesterday; sister visited him at [facility] today and requested him to be seen at the ER because of lack of energy; no changes in mental status were reported; pt has hx of dementia and dysphagia; pt is alert and calm in triage; pt is alert to self only.

## 2015-01-02 NOTE — ED Notes (Signed)
Pt to XRAY

## 2015-01-02 NOTE — ED Notes (Signed)
Patient transported to X-ray 

## 2015-01-02 NOTE — H&P (Addendum)
[facility] History and Physical  AUBERY DOUTHAT ATF:[id number] DOB: 1945-09-21 DOA: 01/02/2015  Referring physician: Emergency Department PCP: Wenda Low, MD  Specialists:   Chief Complaint: Fatigue  HPI: [name] is a 69 y.o. male with a hx of DM2, htn, diastolic CHF stage 3 CKD who presented from facility with increased fatigue and lethargy. Family also noticed increased swelling and subsequently brought pt to the ED. In the Ed, pt was noted to have Cr of 4.22 with BUN of 104 (baseline Cr around 2.5). Pt also noted to have BNP of 1544 with trop of 0.23. CXR was notable for findings suggestive of pulm edema with B pleural effusions Pt was given '80mg'$  IV lasix in the ED, still awaiting response.   Hospitalist consulted for consideration for admission.  Review of Systems:  Review of Systems  Unable to perform ROS: dementia     Past Medical History  Diagnosis Date  . Hyponatremia   . Hypokalemia   . Anemia   . Diabetes mellitus without complication ([place])   . Hypertension   . UTI (lower urinary tract infection)   . Delusional disorder ([place])   . HOH (hard of hearing)   . Hyperlipidemia   . CHF (congestive heart failure) ([place])   . Renal insufficiency   . Stroke ([place])   . Dementia   . Depression   . Dysphagia   . Falls frequently   . Colon cancer ([place]) 2009    s/p Colectomy  . Alzheimer's dementia    Past Surgical History  Procedure Laterality Date  . Colon surgery    . Colectomy  09/06/2007  . Ileostomy    . Peg tube removal    . Cardiac catheterization    . Cholecystectomy    . Abdominal adhesion surgery     Social History:  reports that he has never smoked. He does not have any smokeless tobacco history on file. He reports that he does not drink alcohol or use illicit drugs.  where does patient live--home, ALF, SNF? and with whom if at home?  Can patient participate in ADLs?  No Known Allergies  No family history on file. unable to obtain -  demented  Prior to Admission medications   Medication Sig Start Date End Date Taking? Authorizing Provider  Amino Acids-Protein Hydrolys (FEEDING SUPPLEMENT, PRO-STAT SUGAR FREE 64,) LIQD Take 30 mLs by mouth 2 (two) times daily.    Historical Provider, MD  amLODipine (NORVASC) 10 MG tablet Take 10 mg by mouth daily.    Historical Provider, MD  antiseptic oral rinse (CPC / CETYLPYRIDINIUM CHLORIDE 0.05%) 0.05 % LIQD solution 7 mLs by Mouth Rinse route 2 (two) times daily. Patient not taking: Reported on 10/31/2014 09/23/14   Donita Brooks, NP  aspirin 81 MG chewable tablet Give 81 mg by tube daily.    Historical Provider, MD  atorvastatin (LIPITOR) 20 MG tablet Give 20 mg by tube daily.    Historical Provider, MD  cyanocobalamin (,VITAMIN B-12,) 1000 MCG/ML injection Inject 1,000 mcg into the muscle every 30 (thirty) days.    Historical Provider, MD  donepezil (ARICEPT) 10 MG tablet Give 10 mg by tube at bedtime.     Historical Provider, MD  ferrous sulfate 220 (44 FE) MG/5ML solution Place 330 mg into feeding tube daily.    Historical Provider, MD  hydrALAZINE (APRESOLINE) 50 MG tablet Take 50 mg by mouth 4 (four) times daily - after meals and at bedtime.    Historical Provider, MD  insulin glargine (LANTUS) 100 UNIT/ML injection Inject 20 Units into the skin at bedtime.    Historical Provider, MD  insulin lispro (HUMALOG) 100 UNIT/ML injection Inject 2-12 Units into the skin 4 (four) times daily. MAR has Insulin given 4 times daily with meals. 201-250=2units, 251-300units=4units, 301-350=6units, 351-400=8units, 401-450=10units, >451=12 units    Historical Provider, MD  Multiple Vitamin (MULTIVITAMIN) LIQD Take 5 mLs by mouth daily.    Historical Provider, MD  pantoprazole (PROTONIX) 40 MG injection Inject 40 mg into the vein daily. 09/23/14   Donita Brooks, NP  sertraline (ZOLOFT) 25 MG tablet Take 3 tablets (75 mg total) by mouth daily. 11/01/14   Costin Karlyne Greenspan, MD  tamsulosin (FLOMAX) 0.4  MG CAPS capsule Take 0.4 mg by mouth.    Historical Provider, MD  traZODone (DESYREL) 50 MG tablet Take 25 mg by mouth 2 (two) times daily.    Historical Provider, MD  vitamin C (ASCORBIC ACID) 500 MG tablet Give 1,000 mg by tube 2 (two) times daily.     Historical Provider, MD   Physical Exam: Filed Vitals:   01/02/15 1110 01/02/15 1200 01/02/15 1230 01/02/15 1400  BP: 131/61   142/69  Pulse: 72 81  66  Temp: 98 F (36.7 C)     TempSrc: Axillary     Resp: 20   19  SpO2: 92% 93% 95% 99%     General:  Awake, in nad  Eyes: PERRLB  ENT: membranes moist, dentition fair  Neck: trachea midline, neck supple  Cardiovascular: regular, s1, s2  Respiratory: normal resp effort, no wheezing, crackles throughout  Abdomen: soft, obese, nondistended  Skin: normal skin turgor, no abnormal skin lesions seen  Musculoskeletal: perfused, no clubbing, 2+ pitting LE edema  Psychiatric: unable to fully assess, pt pleasantly confused  Neurologic: cn2-12 grossly intact, strength/sensation intact  Labs on Admission:  Basic Metabolic Panel:  Recent Labs Lab 01/02/15 1227  NA 147*  K 4.7  CL 113*  CO2 18*  GLUCOSE 153*  BUN 104*  CREATININE 4.22*  CALCIUM 9.9   Liver Function Tests:  Recent Labs Lab 01/02/15 1227  AST 61*  ALT 88*  ALKPHOS 173*  BILITOT 0.7  PROT 7.6  ALBUMIN 3.6   No results for input(s): LIPASE, AMYLASE in the last 168 hours. No results for input(s): AMMONIA in the last 168 hours. CBC:  Recent Labs Lab 01/02/15 1227  WBC 13.3*  NEUTROABS 11.1*  HGB 9.2*  HCT 28.6*  MCV 80.8  PLT 208   Cardiac Enzymes:  Recent Labs Lab 01/02/15 1227  TROPONINI 0.23*    BNP (last 3 results)  Recent Labs  09/22/14 0221 09/24/14 0806 01/02/15 1227  BNP 589.5* 160.5* 1544.1*    ProBNP (last 3 results) No results for input(s): PROBNP in the last 8760 hours.  CBG: No results for input(s): GLUCAP in the last 168 hours.  Radiological Exams on  Admission: Dg Chest 2 View  01/02/2015  CLINICAL DATA:  Weakness. The patient was reportedly diagnosed with pneumonia yesterday. EXAM: CHEST  2 VIEW COMPARISON:  Single view of the chest 09/23/2014 and single view of the chest 07/13/2012. FINDINGS: Lung volumes are low. There are small bilateral pleural effusions. Indistinctness of the pulmonary vasculature is identified. No focal airspace disease is seen. Aortic atherosclerosis is noted. IMPRESSION: Appearance of the chest most compatible with congestive heart failure with associated small bilateral pleural effusions. Electronically Signed   By: Inge Rise M.D.   On: 01/02/2015 13:05  EKG: Independently reviewed. NSR  Assessment/Plan Principal Problem:   Acute renal failure superimposed on stage 3 chronic kidney disease (HCC) Active Problems:   Alzheimer's disease   Type 2 diabetes mellitus (HCC)   Edema   Acute on chronic diastolic congestive heart failure (HCC)   Elevated troponin   Essential hypertension   Congestive heart disease ([place])   1. Acute on CKD3 1. Baseline Cr around 2.5. Presenting Cr 4.22 with BUN of 104 2. Pt has evidence of metabolic acidosis 3. As pt is grossly volume overloaded, agree with continued lasix for now 4. Will consult Nephrology for assistance. Met with family, who is agreeable for HD if needed 5. Avoid nephrotoxic agents 6. Admit to med-tele 2. Acute on Chronic diastolic CHF 1. Evidence of gross volume overload 2. Cont lasix per above 3. Will monitor i/o 3. DM2 1. Cont on SSI coverage 2. Cont to monitor 4. Alzheimer's dementia 1. Stable 5. Elevated trop 1. Mild trop leak , likely secondary to worsened renal failure 2. Doubt ACS 3. Will cycle troponin 6. HTN 1. BP stable 2. Cont monitor 7. DVT prophylaxis 1. Heparin subQ  UPDATE: Discussed case with Nephrology. Recs for indwelling foley cath and for '80mg'$  IV lasix TID. Have ordered these.  Code Status: Full Family Communication:  Pt in room, family at bedside Disposition Plan: Admit to [place], [place] [facility] Pager [phone]  If 7PM-7AM, please contact night-coverage [email or web address] Password [facility] 01/02/2015, 2:33 PM

## 2015-01-02 NOTE — ED Notes (Signed)
Hospitalist at bedside 

## 2015-01-02 NOTE — ED Notes (Signed)
Bed: [id number] Expected date: 01/02/15 Expected time: 10:48 AM Means of arrival:  Comments: Weakness

## 2015-01-02 NOTE — ED Provider Notes (Signed)
CSN: [id number]     Arrival date & time 01/02/15  1101 History   First MD Initiated Contact with Patient 01/02/15 1128     Chief Complaint  Patient presents with  . Fatigue     (Consider location/radiation/quality/duration/timing/severity/associated sxs/prior Treatment) HPI She presents from her nursing facility with concern of fatigue. Per report the patient was recently diagnosed with pneumonia, started on antibiotics. Current symptoms, the patient is more fatigued than usual today. Patient has a history of dementia, level V caveat. The patient is smiling, states that he feels fine on exam.  Past Medical History  Diagnosis Date  . Hyponatremia   . Hypokalemia   . Anemia   . Diabetes mellitus without complication ([place])   . Hypertension   . UTI (lower urinary tract infection)   . Delusional disorder ([place])   . HOH (hard of hearing)   . Hyperlipidemia   . CHF (congestive heart failure) ([place])   . Renal insufficiency   . Stroke ([place])   . Dementia   . Depression   . Dysphagia   . Falls frequently   . Colon cancer ([place]) 2009    s/p Colectomy  . Alzheimer's dementia    Past Surgical History  Procedure Laterality Date  . Colon surgery    . Colectomy  09/06/2007  . Ileostomy    . Peg tube removal    . Cardiac catheterization    . Cholecystectomy    . Abdominal adhesion surgery     No family history on file. Social History  Substance Use Topics  . Smoking status: Never Smoker   . Smokeless tobacco: None  . Alcohol Use: No    Review of Systems  Unable to perform ROS: Dementia      Allergies  Review of patient's allergies indicates no known allergies.  Home Medications   Prior to Admission medications   Medication Sig Start Date End Date Taking? Authorizing Provider  Amino Acids-Protein Hydrolys (FEEDING SUPPLEMENT, PRO-STAT SUGAR FREE 64,) LIQD Take 30 mLs by mouth 2 (two) times daily.    Historical Provider, MD  amLODipine (NORVASC) 10 MG tablet Take 10  mg by mouth daily.    Historical Provider, MD  antiseptic oral rinse (CPC / CETYLPYRIDINIUM CHLORIDE 0.05%) 0.05 % LIQD solution 7 mLs by Mouth Rinse route 2 (two) times daily. Patient not taking: Reported on 10/31/2014 09/23/14   Donita Brooks, NP  aspirin 81 MG chewable tablet Give 81 mg by tube daily.    Historical Provider, MD  atorvastatin (LIPITOR) 20 MG tablet Give 20 mg by tube daily.    Historical Provider, MD  cyanocobalamin (,VITAMIN B-12,) 1000 MCG/ML injection Inject 1,000 mcg into the muscle every 30 (thirty) days.    Historical Provider, MD  donepezil (ARICEPT) 10 MG tablet Give 10 mg by tube at bedtime.     Historical Provider, MD  ferrous sulfate 220 (44 FE) MG/5ML solution Place 330 mg into feeding tube daily.    Historical Provider, MD  hydrALAZINE (APRESOLINE) 50 MG tablet Take 50 mg by mouth 4 (four) times daily - after meals and at bedtime.    Historical Provider, MD  insulin glargine (LANTUS) 100 UNIT/ML injection Inject 20 Units into the skin at bedtime.    Historical Provider, MD  insulin lispro (HUMALOG) 100 UNIT/ML injection Inject 2-12 Units into the skin 4 (four) times daily. MAR has Insulin given 4 times daily with meals. 201-250=2units, 251-300units=4units, 301-350=6units, 351-400=8units, 401-450=10units, >451=12 units    Historical Provider, MD  Multiple Vitamin (MULTIVITAMIN) LIQD Take 5 mLs by mouth daily.    Historical Provider, MD  pantoprazole (PROTONIX) 40 MG injection Inject 40 mg into the vein daily. 09/23/14   Donita Brooks, NP  sertraline (ZOLOFT) 25 MG tablet Take 3 tablets (75 mg total) by mouth daily. 11/01/14   Costin Karlyne Greenspan, MD  tamsulosin (FLOMAX) 0.4 MG CAPS capsule Take 0.4 mg by mouth.    Historical Provider, MD  traZODone (DESYREL) 50 MG tablet Take 25 mg by mouth 2 (two) times daily.    Historical Provider, MD  vitamin C (ASCORBIC ACID) 500 MG tablet Give 1,000 mg by tube 2 (two) times daily.     Historical Provider, MD   BP 131/61 mmHg   Pulse 81  Temp(Src) 98 F (36.7 C) (Axillary)  Resp 20  SpO2 95% Physical Exam  Constitutional: He appears well-developed. No distress.  HENT:  Head: Normocephalic and atraumatic.  Eyes: Conjunctivae and EOM are normal.  Cardiovascular: Normal rate and regular rhythm.   Pulmonary/Chest: No stridor. Tachypnea noted. He has decreased breath sounds. He has wheezes.  Abdominal: He exhibits no distension. There is no tenderness.    Musculoskeletal: He exhibits no edema.  Diffuse bilateral lower extremity edema  Neurological: He is alert.  Skin: Skin is warm and dry.  Psychiatric: He is withdrawn. Cognition and memory are impaired.  Nursing note and vitals reviewed.   ED Course  Procedures (including critical care time) Labs Review Labs Reviewed  CBC WITH DIFFERENTIAL/PLATELET - Abnormal; Notable for the following:    WBC 13.3 (*)    RBC 3.54 (*)    Hemoglobin 9.2 (*)    HCT 28.6 (*)    RDW 17.2 (*)    Neutro Abs 11.1 (*)    Lymphs Abs 0.6 (*)    Monocytes Absolute 1.7 (*)    All other components within normal limits  COMPREHENSIVE METABOLIC PANEL  BRAIN NATRIURETIC PEPTIDE  TROPONIN I  URINALYSIS, ROUTINE W REFLEX MICROSCOPIC (NOT AT [facility])    Imaging Review Dg Chest 2 View  01/02/2015  CLINICAL DATA:  Weakness. The patient was reportedly diagnosed with pneumonia yesterday. EXAM: CHEST  2 VIEW COMPARISON:  Single view of the chest 09/23/2014 and single view of the chest 07/13/2012. FINDINGS: Lung volumes are low. There are small bilateral pleural effusions. Indistinctness of the pulmonary vasculature is identified. No focal airspace disease is seen. Aortic atherosclerosis is noted. IMPRESSION: Appearance of the chest most compatible with congestive heart failure with associated small bilateral pleural effusions. Electronically Signed   By: Inge Rise M.D.   On: 01/02/2015 13:05   I have personally reviewed and evaluated these images and lab results as part of my  medical decision-making.   EKG Interpretation   Date/Time:  Thursday January 02 2015 14:16:49 EST Ventricular Rate:  100 PR Interval:    QRS Duration: 116 QT Interval:  381 QTC Calculation: 491 R Axis:   -37 Text Interpretation:  Atrial fibrillation Nonspecific IVCD with LAD  Anteroseptal infarct, old Nonspecific T abnormalities, lateral leads  Atrial fibrillation Non-specific intra-ventricular conduction delay T wave  abnormality Abnormal ekg Confirmed by Carmin Muskrat  MD ([id number]) on  01/02/2015 2:22:05 PM     Pulse oximetry 95% 2 L nasal cannula borderline  1:45 PM Patient in no distress. Patient's sister is now here. She describes the patient having increased fatigue from baseline, is concerned about possible heart failure exacerbation.   Labs notable for elevated BNP, 1544, as well as acute  kidney injury, elevated troponin.  Patient is taking aspirin. Given his diminished cognitive state fall risk, g-tube and colostomy, as well as the high suspicion for demand ischemia, w no overt ST changes on ECG, additional antiplatelet / anticoagulant  meds not indicated.   I discussed all findings with the patient's sister.  I discussed the patient's case with our hospital ist colleague.  Update: Patient remains HD similar, w no new complaints.  HR remains SR, w no sustained tachycardia.  MDM  Patient with multiple medical issues, including heart failure, dementia, presents with dyspnea, family report of fatigue. Patient is demented, cannot describe history of present illness. He is grossly fluid overloaded, and labs are notable for heart failure exacerbation, complicated by acute kidney injury. Patient also has elevated troponin. Given the complex medical issues, the patient required admission, after initiation of additional Lasix.  CRITICAL CARE Performed by: Carmin Muskrat Total critical care time: 35 minutes Critical care time was exclusive of separately billable  procedures and treating other patients. Critical care was necessary to treat or prevent imminent or life-threatening deterioration. Critical care was time spent personally by me on the following activities: development of treatment plan with patient and/or surrogate as well as nursing, discussions with consultants, evaluation of patient's response to treatment, examination of patient, obtaining history from patient or surrogate, ordering and performing treatments and interventions, ordering and review of laboratory studies, ordering and review of radiographic studies, pulse oximetry and re-evaluation of patient's condition.    Carmin Muskrat, MD 01/02/15 1535

## 2015-01-03 ENCOUNTER — Inpatient Hospital Stay (HOSPITAL_COMMUNITY): Payer: Medicare Other

## 2015-01-03 DIAGNOSIS — F0280 Dementia in other diseases classified elsewhere without behavioral disturbance: Secondary | ICD-10-CM

## 2015-01-03 DIAGNOSIS — G309 Alzheimer's disease, unspecified: Secondary | ICD-10-CM

## 2015-01-03 LAB — BASIC METABOLIC PANEL
Anion gap: 13 (ref 5–15)
BUN: 105 mg/dL — AB (ref 6–20)
CO2: 24 mmol/L (ref 22–32)
CREATININE: 4.05 mg/dL — AB (ref 0.61–1.24)
Calcium: 10 mg/dL (ref 8.9–10.3)
Chloride: 112 mmol/L — ABNORMAL HIGH (ref 101–111)
GFR calc Af Amer: 16 mL/min — ABNORMAL LOW (ref >60)
GFR, EST NON AFRICAN AMERICAN: 14 mL/min — AB (ref >60)
GLUCOSE: 83 mg/dL (ref 65–99)
POTASSIUM: 3.4 mmol/L — AB (ref 3.5–5.1)
SODIUM: 149 mmol/L — AB (ref 135–145)

## 2015-01-03 LAB — COMPREHENSIVE METABOLIC PANEL
ALK PHOS: 72 U/L (ref 38–126)
ALT: 13 U/L — AB (ref 17–63)
AST: 15 U/L (ref 15–41)
Albumin: 3.1 g/dL — ABNORMAL LOW (ref 3.5–5.0)
Anion gap: 5 (ref 5–15)
BUN: 11 mg/dL (ref 6–20)
CHLORIDE: 109 mmol/L (ref 101–111)
CO2: 28 mmol/L (ref 22–32)
Calcium: 9.5 mg/dL (ref 8.9–10.3)
Creatinine, Ser: 0.89 mg/dL (ref 0.61–1.24)
GLUCOSE: 121 mg/dL — AB (ref 65–99)
POTASSIUM: 4.2 mmol/L (ref 3.5–5.1)
Sodium: 142 mmol/L (ref 135–145)
TOTAL PROTEIN: 5.8 g/dL — AB (ref 6.5–8.1)
Total Bilirubin: 0.7 mg/dL (ref 0.3–1.2)

## 2015-01-03 LAB — IRON AND TIBC
Iron: 22 ug/dL — ABNORMAL LOW (ref 45–182)
SATURATION RATIOS: 9 % — AB (ref 17.9–39.5)
TIBC: 248 ug/dL — ABNORMAL LOW (ref 250–450)
UIBC: 226 ug/dL

## 2015-01-03 LAB — GLUCOSE, CAPILLARY
GLUCOSE-CAPILLARY: 76 mg/dL (ref 65–99)
GLUCOSE-CAPILLARY: 125 mg/dL — AB (ref 65–99)
GLUCOSE-CAPILLARY: 149 mg/dL — AB (ref 65–99)
Glucose-Capillary: 169 mg/dL — ABNORMAL HIGH (ref 65–99)

## 2015-01-03 LAB — PROTEIN, URINE, 24 HOUR
Collection Interval-UPROT: 24 hours
Protein, 24H Urine: 404 mg/d — ABNORMAL HIGH (ref 50–100)
Protein, Urine: 8 mg/dL
URINE TOTAL VOLUME-UPROT: 5050 mL

## 2015-01-03 LAB — CBC
HEMATOCRIT: 27.6 % — AB (ref 39.0–52.0)
HEMOGLOBIN: 9 g/dL — AB (ref 13.0–17.0)
MCH: 26.2 pg (ref 26.0–34.0)
MCHC: 32.6 g/dL (ref 30.0–36.0)
MCV: 80.2 fL (ref 78.0–100.0)
Platelets: 211 10*3/uL (ref 150–400)
RBC: 3.44 MIL/uL — ABNORMAL LOW (ref 4.22–5.81)
RDW: 17.3 % — AB (ref 11.5–15.5)
WBC: 9.3 10*3/uL (ref 4.0–10.5)

## 2015-01-03 LAB — TROPONIN I
Troponin I: 0.18 ng/mL — ABNORMAL HIGH (ref <0.031)
Troponin I: 0.18 ng/mL — ABNORMAL HIGH (ref <0.031)

## 2015-01-03 LAB — HEPATITIS PANEL, ACUTE
HEP A IGM: NEGATIVE
HEP B C IGM: NEGATIVE
HEP B S AG: NEGATIVE

## 2015-01-03 LAB — HIV ANTIBODY (ROUTINE TESTING W REFLEX): HIV SCREEN 4TH GENERATION: NONREACTIVE

## 2015-01-03 MED ORDER — JEVITY 1.2 CAL PO LIQD: 1000.0000 mL | ORAL | Status: DC

## 2015-01-03 MED ORDER — PRO-STAT SUGAR FREE PO LIQD
30.0000 mL | Freq: Every day | ORAL | Status: DC
  Administered 2015-01-03 – 2015-01-07 (×5): 30 mL
  Filled 2015-01-03 (×6): qty 30

## 2015-01-03 MED ORDER — FREE WATER
50.0000 mL | Freq: Three times a day (TID) | Status: DC
  Administered 2015-01-03 – 2015-01-04 (×3): 50 mL

## 2015-01-03 MED ORDER — DEXTROSE 5 % IV SOLN
INTRAVENOUS | Status: DC
  Administered 2015-01-03: 125 mL via INTRAVENOUS
  Administered 2015-01-03 – 2015-01-04 (×2): via INTRAVENOUS

## 2015-01-03 MED ORDER — OSMOLITE 1.5 CAL PO LIQD
1000.0000 mL | ORAL | Status: DC
  Administered 2015-01-03 – 2015-01-06 (×4): 1000 mL
  Filled 2015-01-03 (×6): qty 1000

## 2015-01-03 MED ORDER — OSMOLITE 1.2 CAL PO LIQD
1000.0000 mL | ORAL | Status: DC
  Filled 2015-01-03: qty 1000

## 2015-01-03 MED ORDER — VITAMINS A & D EX OINT
TOPICAL_OINTMENT | CUTANEOUS | Status: AC
  Administered 2015-01-03: 5
  Filled 2015-01-03: qty 5

## 2015-01-03 NOTE — NC FL2 (Signed)
[place] MEDICAID FL2 LEVEL OF CARE SCREENING TOOL     IDENTIFICATION  Patient Name: [name] Birthdate: 1945-05-10 Sex: male Admission Date (Current Location): 01/02/2015  [place] and [place] Number:  Kathleen Argue  ([id number]) Facility and Address:  [facility],  [place] [address], [place]      Provider Number: [id number]  Attending Physician Name and Address:  Nita Sells, MD  Relative Name and Phone Number:       Current Level of Care: SNF Recommended Level of Care: [place] Prior Approval Number:    Date Approved/Denied:   PASRR Number:  ([id number])  Discharge Plan: SNF    Current Diagnoses: Patient Active Problem List   Diagnosis Date Noted  . Acute kidney injury ([place]) 01/02/2015  . ARF (acute renal failure) ([place]) 01/02/2015  . Bradycardia 10/31/2014  . Hyponatremia 10/31/2014  . Acute on chronic diastolic congestive heart failure ([place]) 09/20/2014  . Elevated troponin 09/20/2014  . Essential hypertension 09/20/2014  . Acute renal failure superimposed on stage 3 chronic kidney disease ([place])   . Hyperkalemia   . Congestive heart disease ([place])   . Diabetes ([place]) 11/03/2013  . Hyperlipidemia 11/02/2013  . Edema 08/09/2013  . Hypopotassemia 06/28/2013  . Chronic kidney disease 08/14/2012  . Other dysphagia 07/12/2012  . Palliative care encounter 07/12/2012  . Acute renal failure ([place]) 07/03/2012  . UTI (lower urinary tract infection) 07/03/2012  . Cellulitis and abscess of leg 07/03/2012  . Anemia in chronic renal disease 06/01/2012  . Type 2 diabetes mellitus ([place]) 06/01/2012  . Benign renovascular hypertension 06/01/2012  . FISTULA, INTESTINE 02/23/2010  . ADENOCARCINOMA, COLON, HX OF 04/23/2009  . DM 11/18/2008  . Alzheimer's disease 11/18/2008  . NONSPECIFIC ABN FINDING RAD & OTH EXAM GI TRACT 11/18/2008    Orientation RESPIRATION BLADDER Height & Weight    Self  O2 (4L) Indwelling catheter 5'  7" (170.2 cm) 230 lbs.  BEHAVIORAL SYMPTOMS/MOOD NEUROLOGICAL BOWEL NUTRITION STATUS      Colostomy Diet (soft)  AMBULATORY STATUS COMMUNICATION OF NEEDS Skin   Extensive Assist Verbally  (Wound/Incision(OpenorDehisced)12/22/16AbdomenLeft;Lower)                       Personal Care Assistance Level of Assistance  Bathing, Feeding, Dressing Bathing Assistance: Maximum assistance Feeding assistance: Maximum assistance Dressing Assistance: Maximum assistance     Functional Limitations Info             SPECIAL CARE FACTORS FREQUENCY                       Contractures      Additional Factors Info  Code Status, Allergies Code Status Info:  (Fullcode) Allergies Info:  (NKDA)           Current Medications (01/03/2015):  This is the current hospital active medication list Current Facility-Administered Medications  Medication Dose Route Frequency Provider Last Rate Last Dose  . amLODipine (NORVASC) tablet 10 mg  10 mg Oral Daily Donne Hazel, MD   10 mg at 01/03/15 1023  . aspirin chewable tablet 81 mg  81 mg Oral Daily Donne Hazel, MD   81 mg at 01/03/15 1023  . atorvastatin (LIPITOR) tablet 20 mg  20 mg Oral Daily Donne Hazel, MD   20 mg at 01/03/15 1023  . donepezil (ARICEPT) tablet 10 mg  10 mg Oral QHS Donne Hazel, MD   10 mg at 01/02/15 2147  .  feeding supplement (OSMOLITE 1.2 CAL) liquid 1,000 mL  1,000 mL Per Tube Q24H Maricela Bo Ost[place], RD      . furosemide (LASIX) injection 80 mg  80 mg Intravenous Q8H Donne Hazel, MD   80 mg at 01/03/15 0804  . heparin injection 5,000 Units  5,000 Units Subcutaneous 3 times per day Donne Hazel, MD   5,000 Units at 01/03/15 [phone]  . hydrALAZINE (APRESOLINE) tablet 50 mg  50 mg Oral TID PC & HS Donne Hazel, MD   50 mg at 01/03/15 0804  . insulin aspart (novoLOG) injection 0-5 Units  0-5 Units Subcutaneous QHS Donne Hazel, MD   0 Units at 01/02/15 2200  . insulin aspart (novoLOG) injection 0-9  Units  0-9 Units Subcutaneous TID WC Donne Hazel, MD   0 Units at 01/02/15 1700  . insulin glargine (LANTUS) injection 20 Units  20 Units Subcutaneous QHS Donne Hazel, MD   20 Units at 01/02/15 2147  . sertraline (ZOLOFT) tablet 150 mg  150 mg Per Tube Daily Donne Hazel, MD   150 mg at 01/03/15 1022  . sodium chloride 0.9 % injection 3 mL  3 mL Intravenous Q12H Donne Hazel, MD   3 mL at 01/03/15 1023  . tamsulosin (FLOMAX) capsule 0.4 mg  0.4 mg Oral Daily Donne Hazel, MD   0.4 mg at 01/03/15 1023     Discharge Medications: Please see discharge summary for a list of discharge medications.  Relevant Imaging Results:  Relevant Lab Results:   Additional Information  (SSN: [id number])  Standley Brooking, LCSW

## 2015-01-03 NOTE — Progress Notes (Signed)
[name] [id number] DOB: Aug 17, 1945 DOA: 01/02/2015 PCP: Wenda Low, MD  Brief narrative: 69 y/o ? 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 Cannot tell me where he has thinks he is in [place] Does not know the year nor time.    Objective    Interim History:   Telemetry: sinus rhythm   Objective: Filed Vitals:   01/03/15 0527 01/03/15 1225 01/03/15 1231 01/03/15 1335  BP: 123/60   139/56  Pulse: 72   117  Temp: 98.4 F (36.9 C)   98.1 F (36.7 C)  TempSrc: Oral   Oral  Resp:    20  Height:      Weight:      SpO2: 100% 100% 94% 95%    Intake/Output Summary (Last 24 hours) at 01/03/15 1547 Last data filed at 01/03/15 1212  Gross per 24 hour  Intake     95 ml  Output   4200 ml  Net  -4105 ml    Exam:  General: EOMI NCAT Cardiovascular: S1-S2 no murmur rub or gallop Respiratory: increased lung sounds posteriorly with some crackles Abdomen: soft nontender nondistended no rebound Skingrade 1 lower extremity edema Neuro intact but confused able to move all 4 limbs on command  Data Reviewed: Basic Metabolic Panel:  Recent Labs Lab 01/02/15 1227 01/03/15 0120 01/03/15 1125  NA 147* 142 149*  K 4.7 4.2 3.4*  CL 113* 109 112*  CO2 18* 28 24  GLUCOSE 153* 121* 83  BUN 104* 11 105*  CREATININE 4.22* 0.89 4.05*  CALCIUM 9.9 9.5 10.0   Liver  Function Tests:  Recent Labs Lab 01/02/15 1227 01/03/15 0120  AST 61* 15  ALT 88* 13*  ALKPHOS 173* 72  BILITOT 0.7 0.7  PROT 7.6 5.8*  ALBUMIN 3.6 3.1*   No results for input(s): LIPASE, AMYLASE in the last 168 hours. No results for input(s): AMMONIA in the last 168 hours. CBC:  Recent Labs Lab 01/02/15 1227 01/03/15 0120  WBC 13.3* 9.3  NEUTROABS 11.1*  --   HGB 9.2* 9.0*  HCT 28.6* 27.6*  MCV 80.8 80.2  PLT 208 211   Cardiac Enzymes:  Recent Labs Lab 01/02/15 1227 01/02/15 1820 01/03/15 0120 01/03/15 0802  TROPONINI 0.23* 0.22* 0.18* 0.18*   BNP: Invalid input(s): POCBNP CBG:  Recent Labs Lab 01/02/15 1638 01/02/15 2129 01/03/15 0756 01/03/15 1204  GLUCAP 108* 120* 76 125*    Recent Results (from the past 240 hour(s))  MRSA PCR Screening     Status: None   Collection Time: 01/02/15  4:55 PM  Result Value Ref Range Status   MRSA by PCR NEGATIVE NEGATIVE Final    Comment:        The GeneXpert MRSA Assay (FDA approved for NASAL specimens only), is one component of a comprehensive MRSA colonization surveillance program. It is not intended to diagnose MRSA infection nor to guide or monitor treatment for MRSA infections.  Studies:              All Imaging reviewed and is as per above notation   Scheduled Meds: . amLODipine  10 mg Oral Daily  . aspirin  81 mg Oral Daily  . atorvastatin  20 mg Oral Daily  . donepezil  10 mg Oral QHS  . feeding supplement (PRO-STAT SUGAR FREE 64)  30 mL Per Tube Daily  . free water  50 mL Per Tube 3 times per day  . heparin  5,000 Units Subcutaneous 3 times per day  . hydrALAZINE  50 mg Oral TID PC & HS  . insulin aspart  0-5 Units Subcutaneous QHS  . insulin aspart  0-9 Units Subcutaneous TID WC  . insulin glargine  20 Units Subcutaneous QHS  . sertraline  150 mg Per Tube Daily  . sodium chloride  3 mL Intravenous Q12H  . tamsulosin  0.4 mg Oral Daily   Continuous Infusions: . dextrose 125 mL  (01/03/15 1416)  . feeding supplement (OSMOLITE 1.5 CAL) 1,000 mL (01/03/15 1218)     Assessment/Plan:      1. Acute on CKD3 with hypernatremia 1. Baseline Cr around 2.5. Presenting Cr 4.22 with BUN of 104 2. Pt has evidence of metabolic acidosis 3. appreciate Nephrology assistance-poor overall candidate for dialysis 4. Monitor renal function and for resolution however if not consider palliative care inputin the next 24-48 hours 5. nephrology changed him over to dextrose 5% 125 cc per hour 2. Acute on Chronic diastolic CHF 1. Evidence of gross volume overload 2. Lasix discontinued 3. get desats screen and monitor as may need Lasix in the next 1-2 days dependent on  respiratory status 3. DM2 1. Cont on SSI coverage 2. blood sugars ranging between 76 and 125 3. Continue Lantus 20 and sliding scale coverage with 3 units with meals 4. Alzheimer's dementia 1. profound, reasonable to discontinue Aricept as would not make any specific change. Continue sertraline 150 dailyfor behavioral disturbances  5. Elevated troponin unlikely to be acute coronary syndrome 1. Mild trop leak , likely secondary to worsened renal failure 2. troponin trend is flat 6. HTN 1. BP stable on amlodipine 10, hydralazine 3 times a day   7.   History of colon cancer status post colectomy and ileostomy201 1. continue PEG feeds and have reinitiated them 12/23 8.   DVT prophylaxis     Heparin subQ  Discussed with family member who felt that patient would benefit from dialysis. She mentioned when I spoke to her that when they ask him his opinion of medical care he nods and acquiesces to whatever they seem to suggest. I tried to explain to them that this does not mean that he is consenting per se and they can see to it that the patient probably does not understand what is going on around him. Note that patient has been seen by nephrology and they recommend no aggressive measures from a dialysis perspective. If  patient does not improve over the next 24-48 hours, would consider palliative care to see the patient and delineate goals of care    Verneita Griffes, MD  [facility] Pager [phone] 01/03/2015, 3:47 PM    LOS: 1 day

## 2015-01-03 NOTE — Clinical Social Work Note (Signed)
Clinical Social Work Assessment  Patient Details  Name: [name] MRN: [id number] Date of Birth: January 09, 1946  Date of referral:  01/03/15               Reason for consult:  Facility Placement                Permission sought to share information with:  Family Supports, Customer service manager Permission granted to share information::     Name::     daughters- [name] and Scientist, research (life sciences)::  SNF  Relationship::     Contact Information:     Housing/Transportation Living arrangements for the past 2 months:  [place] of Information:  Adult Children Patient Interpreter Needed:  None Criminal Activity/Legal Involvement Pertinent to Current Situation/Hospitalization:  No - Comment as needed Significant Relationships:  Adult Children, Warehouse manager Lives with:  Facility Resident Do you feel safe going back to the place where you live?  Yes Need for family participation in patient care:  Yes (Comment)  Care giving concerns:  CSW spoke with patients sister, Festus Holts, who confirms plans for patient to return to SNF bed at [facility] [employer].    Social Worker assessment / plan:  CSW spoke with sister, Ella,as patient was not arousable and confirmed plans for return to [facility] once medically ready.   Employment status:  Retired Forensic scientist:  Medicare PT Recommendations:  [place] / Referral to community resources:  [place]  Patient/Family's Response to care:  Good- sister inquiring about swelling and requested an update from MD- Dr Verlon Au advised.  Patient/Family's Understanding of and Emotional Response to Diagnosis, Current Treatment, and Prognosis:  Family understands and agrees to plans for treatment and for return to SNF once stable.   Emotional Assessment Appearance:  Developmentally appropriate Attitude/Demeanor/Rapport:  Unable to Assess Affect (typically observed):  Unable to  Assess Orientation:    Alcohol / Substance use:    Psych involvement (Current and /or in the community):  No (Comment)  Discharge Needs  Concerns to be addressed:    Readmission within the last 30 days:  No Current discharge risk:  None Barriers to Discharge:  No Barriers Identified   Ludwig Clarks, LCSW 01/03/2015, 1:52 PM

## 2015-01-03 NOTE — Consult Note (Signed)
WOC ostomy consult note Stoma type/location: RLQ, end ileostomy from 2009 Stomal assessment/size: pouch intact   Peristomal assessment:  Pouch intact Treatment options for stomal/peristomal skin: NA Output liquid brown Ostomy pouching:2pc. In place, intact  Education provided: patient is dependent in care of ostomy, lives in [place].  Supplies ordered for staff to change pouch as needed, would recommend 2x wk pouch changes.  Discussed POC with patient and bedside nurse.  Re consult if needed, will not follow at this time. Thanks  Haim Hansson [place], [place] [phone])

## 2015-01-03 NOTE — Progress Notes (Addendum)
Initial Nutrition Assessment  DOCUMENTATION CODES:   Obesity unspecified  INTERVENTION:  - Will order Osmolite 1.5 @ 45 mL/hr with 30 mL Prostat once/day, and 50 mL free water TID which will provide 1720 kcal, 83 grams of protein, and 923 mL free water.  - RD will continue to monitor for needs  NUTRITION DIAGNOSIS:   Inadequate oral intake related to lethargy/confusion as evidenced by meal completion < 25%.  GOAL:   Patient will meet greater than or equal to 90% of their needs  MONITOR:   PO intake, TF tolerance, Weight trends, Labs, Skin, I & O's  REASON FOR ASSESSMENT:   Consult Enteral/tube feeding initiation and management  ASSESSMENT:   69 y.o. male with a hx of DM2, htn, diastolic CHF stage 3 CKD who presented from facility with increased fatigue and lethargy. Family also noticed increased swelling and subsequently brought pt to the ED. In the Ed, pt was noted to have Cr of 4.22 with BUN of 104 (baseline Cr around 2.5). Pt also noted to have BNP of 1544 with trop of 0.23. CXR was notable for findings suggestive of pulm edema with B pleural effusions Pt was given 80mg  IV lasix in the ED, still awaiting response.   Pt seen for consult. BMI indicates obesity. Pt ate 5% of dinner last night, per chart review, and RD visualized breakfast tray with a few bites taken. Pt with hx of dementia and unable to provide information. No family or visitors present at time time.   Spoke with RN who reports that she spoke with Surveyor, quantity from the facility and pt was eating poorly PTA and was receiving Isosource 1.5 @ 100 mL/hr from 8PM-6AM (10 hours) with 30 mL Prostat BID. This regimen was providing 1700 kcal, 98 grams of protein, and 764 mL free water. This TF formula not available in hospital formulary so will order comparable TF formula as outlined above.    RN reports that pt pockets food but that he drinks fluids well. Physical assessment shows no muscle or fat wasting, mild  edema. Notes indicate pt with hx of stage 3 CKD and fluid overload on admission. Chart review indicates 4 lb weight gain in the past 2 months; will continue to monitor weight trends during hospitalization.   Not meeting needs at this time. Medications reviewed; Lasix 80 mg every 8 hours. Labs reviewed; CBGs: 76-120 mg/dL.    ADDENDUM: had not previously added Prostat to home TF regimen calculation.    Diet Order:  DIET SOFT Room service appropriate?: Yes; Fluid consistency:: Thin  Skin:  Wound (see comment) (Incision with no documented location)  Last BM:  12/22  Height:   Ht Readings from Last 1 Encounters:  01/02/15 5\' 7"  (1.702 m)    Weight:   Wt Readings from Last 1 Encounters:  01/02/15 230 lb 6.1 oz (104.5 kg)    Ideal Body Weight:  67.27 kg (kg)  BMI:  Body mass index is 36.07 kg/(m^2).  Estimated Nutritional Needs:   Kcal:  1600-1800  Protein:  70-80 grams  Fluid:  1.3-1.5 L/day  EDUCATION NEEDS:   No education needs identified at this time     Jarome Matin, RD, LDN Inpatient Clinical Dietitian Pager # [phone] After hours/weekend pager # [phone]

## 2015-01-03 NOTE — Consult Note (Signed)
Referring Provider: No ref. provider found Primary Care Physician:  Wenda Low, MD Primary Nephrologist:    Reason for Consultation:   Acute Kidney Injury  Chronic renal failure  Hypokalemia and hypernatremia   HPI:  This is a delightful man that is a resident of [facility] facility and has been there for about 4 years. He has a baseline creatinine of about 2.5 and GRF about 30 -35 He was noted to have increase in lower extremity swelling for several months although he has been ambulating with a cane and has been drinking fluids . He receives tube feeding, and  there has been no reports of diarrhea  He presented with increased fatigue , having not left his bed in several days  On presentation to the ER it was found that he had a creatinine of 4   Renal U/S no obstruction  Good response to diuretics   No NSAIDS  Past Medical History  Diagnosis Date  . Hyponatremia   . Hypokalemia   . Anemia   . Diabetes mellitus without complication ([place])   . Hypertension   . UTI (lower urinary tract infection)   . Delusional disorder ([place])   . HOH (hard of hearing)   . Hyperlipidemia   . CHF (congestive heart failure) ([place])   . Renal insufficiency   . Stroke ([place])   . Dementia   . Depression   . Dysphagia   . Falls frequently   . Colon cancer ([place]) 2009    s/p Colectomy  . Alzheimer's dementia   . A-fib [facility])     Past Surgical History  Procedure Laterality Date  . Colon surgery    . Colectomy  09/06/2007  . Ileostomy    . Peg tube removal    . Cardiac catheterization    . Cholecystectomy    . Abdominal adhesion surgery      Prior to Admission medications   Medication Sig Start Date End Date Taking? Authorizing Provider  Amino Acids-Protein Hydrolys (FEEDING SUPPLEMENT, PRO-STAT SUGAR FREE 64,) LIQD Take 30 mLs by mouth 2 (two) times daily.   Yes Historical Provider, MD  amLODipine (NORVASC) 10 MG tablet Give 10 mg by tube daily.    Yes Historical Provider, MD   aspirin 81 MG chewable tablet Give 81 mg by tube daily.   Yes Historical Provider, MD  atorvastatin (LIPITOR) 20 MG tablet Give 20 mg by tube daily.   Yes Historical Provider, MD  cyanocobalamin (,VITAMIN B-12,) 1000 MCG/ML injection Inject 1,000 mcg into the muscle every 30 (thirty) days.   Yes Historical Provider, MD  donepezil (ARICEPT) 10 MG tablet Give 10 mg by tube at bedtime.    Yes Historical Provider, MD  ferrous sulfate 220 (44 FE) MG/5ML solution Place 330 mg into feeding tube daily.   Yes Historical Provider, MD  furosemide (LASIX) 40 MG tablet Give 40 mg by tube daily. Furosemide 40 BID for three days and then take 1 tablet daily starting on 01/04/15   Yes Historical Provider, MD  hydrALAZINE (APRESOLINE) 50 MG tablet Give 50 mg by tube 4 (four) times daily - after meals and at bedtime.    Yes Historical Provider, MD  insulin glargine (LANTUS) 100 UNIT/ML injection Inject 20 Units into the skin at bedtime.   Yes Historical Provider, MD  insulin lispro (HUMALOG) 100 UNIT/ML injection Inject 2-12 Units into the skin 4 (four) times daily. MAR has Insulin given 4 times daily with meals. 201-250=2units, 251-300units=4units, 301-350=6units, 351-400=8units, 401-450=10units, >451=12 units  Yes Historical Provider, MD  moxifloxacin (AVELOX) 400 MG tablet Take 400 mg by mouth daily at 8 pm.   Yes Historical Provider, MD  Multiple Vitamin (MULTIVITAMIN) LIQD 5 mLs by PEG Tube route daily.    Yes Historical Provider, MD  pantoprazole (PROTONIX) 40 MG injection Inject 40 mg into the vein daily. 09/23/14  Yes Donita Brooks, NP  sertraline (ZOLOFT) 100 MG tablet Place 150 mg into feeding tube daily.   Yes Historical Provider, MD  tamsulosin (FLOMAX) 0.4 MG CAPS capsule Take 0.4 mg by mouth.   Yes Historical Provider, MD  traZODone (DESYREL) 50 MG tablet 25 mg by Feeding Tube route 2 (two) times daily.    Yes Historical Provider, MD  vitamin C (ASCORBIC ACID) 500 MG tablet Give 1,000 mg by tube 2  (two) times daily.    Yes Historical Provider, MD  antiseptic oral rinse (CPC / CETYLPYRIDINIUM CHLORIDE 0.05%) 0.05 % LIQD solution 7 mLs by Mouth Rinse route 2 (two) times daily. Patient not taking: Reported on 10/31/2014 09/23/14   Donita Brooks, NP  sertraline (ZOLOFT) 25 MG tablet Take 3 tablets (75 mg total) by mouth daily. Patient not taking: Reported on 01/02/2015 11/01/14   Caren Griffins, MD    Current Facility-Administered Medications  Medication Dose Route Frequency Provider Last Rate Last Dose  . amLODipine (NORVASC) tablet 10 mg  10 mg Oral Daily Donne Hazel, MD   10 mg at 01/03/15 1023  . aspirin chewable tablet 81 mg  81 mg Oral Daily Donne Hazel, MD   81 mg at 01/03/15 1023  . atorvastatin (LIPITOR) tablet 20 mg  20 mg Oral Daily Donne Hazel, MD   20 mg at 01/03/15 1023  . dextrose 5 % solution   Intravenous Continuous Edrick Oh, MD      . donepezil (ARICEPT) tablet 10 mg  10 mg Oral QHS Donne Hazel, MD   10 mg at 01/02/15 2147  . feeding supplement (OSMOLITE 1.5 CAL) liquid 1,000 mL  1,000 mL Per Tube Continuous Rosezetta Schlatter, RD 45 mL/hr at 01/03/15 1218 1,000 mL at 01/03/15 1218  . feeding supplement (PRO-STAT SUGAR FREE 64) liquid 30 mL  30 mL Per Tube Daily Maricela Bo Ost[place], RD      . free water 50 mL  50 mL Per Tube 3 times per day Rosezetta Schlatter, RD      . heparin injection 5,000 Units  5,000 Units Subcutaneous 3 times per day Donne Hazel, MD   5,000 Units at 01/03/15 [phone]  . hydrALAZINE (APRESOLINE) tablet 50 mg  50 mg Oral TID PC & HS Donne Hazel, MD   50 mg at 01/03/15 0804  . insulin aspart (novoLOG) injection 0-5 Units  0-5 Units Subcutaneous QHS Donne Hazel, MD   0 Units at 01/02/15 2200  . insulin aspart (novoLOG) injection 0-9 Units  0-9 Units Subcutaneous TID WC Donne Hazel, MD   1 Units at 01/03/15 1235  . insulin glargine (LANTUS) injection 20 Units  20 Units Subcutaneous QHS Donne Hazel, MD   20 Units at 01/02/15 2147  .  sertraline (ZOLOFT) tablet 150 mg  150 mg Per Tube Daily Donne Hazel, MD   150 mg at 01/03/15 1022  . sodium chloride 0.9 % injection 3 mL  3 mL Intravenous Q12H Donne Hazel, MD   3 mL at 01/03/15 1023  . tamsulosin (FLOMAX) capsule 0.4 mg  0.4 mg  Oral Daily Donne Hazel, MD   0.4 mg at 01/03/15 1023    Allergies as of 01/02/2015  . (No Known Allergies)    No family history on file.  Social History   Social History  . Marital Status: Single    Spouse Name: N/A  . Number of Children: N/A  . Years of Education: N/A   Occupational History  . Not on file.   Social History Main Topics  . Smoking status: Never Smoker   . Smokeless tobacco: Not on file  . Alcohol Use: No  . Drug Use: No  . Sexual Activity: Not Currently   Other Topics Concern  . Not on file   Social History Narrative    Review of Systems: Unable to obtain  Physical Exam: Vital signs in last 24 hours: Temp:  [98.2 F (36.8 C)-98.4 F (36.9 C)] 98.4 F (36.9 C) (12/23 0527) Pulse Rate:  [66-87] 72 (12/23 0527) Resp:  [18-26] 18 (12/23 0022) BP: (123-154)/(52-70) 123/60 mmHg (12/23 0527) SpO2:  [94 %-100 %] 94 % (12/23 1231) Weight:  [104.5 kg (230 lb 6.1 oz)] 104.5 kg (230 lb 6.1 oz) (12/22 1900) Last BM Date: 01/03/15 (colostomy) General:   Ill appearing  Head:  Normocephalic and atraumatic. Eyes:  Sclera clear, no icterus.   Conjunctiva pale Ears:  Normal auditory acuity. Nose:  No deformity, discharge,  or lesions. Mouth:  No deformity or lesions, dentition normal. Neck:  Supple; no masses or thyromegaly. JVP not elevated Lungs:   Diminished air entry  Heart:   Regular but distant heart sounds  Abdomen: Distended abdomen  Msk:  Symmetrical without gross deformities. Normal posture. Pulses:  No carotid, renal, femoral bruits. DP and PT symmetrical and equal Extremities Gross edema  Neurologic:  Alert Deaf  .  Intake/Output from previous day: 12/22 0701 - 12/23 0700 In: 70  [P.O.:70] Out: 2750 [Urine:2650; Stool:100] Intake/Output this shift: Total I/O In: -  Out: 1450 [Urine:1450]  Lab Results:  Recent Labs  01/02/15 1227 01/03/15 0120  WBC 13.3* 9.3  HGB 9.2* 9.0*  HCT 28.6* 27.6*  PLT 208 211   BMET  Recent Labs  01/02/15 1227 01/03/15 0120 01/03/15 1125  NA 147* 142 149*  K 4.7 4.2 3.4*  CL 113* 109 112*  CO2 18* 28 24  GLUCOSE 153* 121* 83  BUN 104* 11 105*  CREATININE 4.22* 0.89 4.05*  CALCIUM 9.9 9.5 10.0   LFT  Recent Labs  01/03/15 0120  PROT 5.8*  ALBUMIN 3.1*  AST 15  ALT 13*  ALKPHOS 72  BILITOT 0.7   PT/INR No results for input(s): LABPROT, INR in the last 72 hours. Hepatitis Panel  Recent Labs  01/02/15 1820  HEPBSAG Negative  HCVAB <0.1  HEPAIGM Negative  HEPBIGM Negative    Studies/Results: Dg Chest 2 View  01/02/2015  CLINICAL DATA:  Weakness. The patient was reportedly diagnosed with pneumonia yesterday. EXAM: CHEST  2 VIEW COMPARISON:  Single view of the chest 09/23/2014 and single view of the chest 07/13/2012. FINDINGS: Lung volumes are low. There are small bilateral pleural effusions. Indistinctness of the pulmonary vasculature is identified. No focal airspace disease is seen. Aortic atherosclerosis is noted. IMPRESSION: Appearance of the chest most compatible with congestive heart failure with associated small bilateral pleural effusions. Electronically Signed   By: Inge Rise M.D.   On: 01/02/2015 13:05   US Renal  01/02/2015  CLINICAL DATA:  Acute renal failure EXAM: RENAL / URINARY TRACT ULTRASOUND COMPLETE  COMPARISON:  09/20/2014 FINDINGS: Right Kidney: Length: 12.0 cm. Cortical thinning. Mildly increased echotexture. No mass or hydronephrosis. Left Kidney: Length: 12.0 cm. Mild cortical thinning. Slight increased echotexture. No mass or hydronephrosis Bladder: Appears normal for degree of bladder distention. IMPRESSION: Slight increased echotexture and cortical thinning bilaterally  suggesting chronic medical renal disease, similar to prior study. No acute findings. Electronically Signed   By: Rolm Baptise M.D.   On: 01/02/2015 16:50   Dg Chest Port 1 View  01/03/2015  CLINICAL DATA:  69 year old male with weakness and shortness of breath. Reportedly diagnosed with pneumonia recently. Initial encounter. EXAM: PORTABLE CHEST 1 VIEW COMPARISON:  01/02/2015 and earlier. FINDINGS: Portable AP semi upright view at 1140 hours. Mildly lower lung volumes. Stable cardiomegaly and mediastinal contours. Continued increased interstitial opacity in both lungs. Small pleural effusions more apparent yesterday have not progressed. No pneumothorax. No consolidation. IMPRESSION: Stable radiographic appearance of the chest since yesterday with top differential considerations of pulmonary interstitial edema versus atypical/viral respiratory infection. Small pleural effusions. Electronically Signed   By: Genevie Ann M.D.   On: 01/03/2015 11:58    Assessment/Plan:  Chronic renal disease  Stage 3/4           Serial creatinine , Is and Os  And discussed end of life issues with brother           Not candidate for long term outpatient dialysis in my opinion   Acute Kidney Injury            Serial Creatinine  Is and Os             Foley placed            Urinalysis Pending            No nephrotoxins although no hemodynamic changes   Anemia             Iron stores             ESA if needed  Bones             PTH   Hypertension              Volume excess  Lower extremity edema  Hypernatremia                Will stop lasix                Replete free water    LOS: 1 Annais Crafts W @TODAY @1 :29 PM

## 2015-01-04 DIAGNOSIS — F0281 Dementia in other diseases classified elsewhere with behavioral disturbance: Secondary | ICD-10-CM

## 2015-01-04 DIAGNOSIS — N183 Chronic kidney disease, stage 3 (moderate): Secondary | ICD-10-CM

## 2015-01-04 DIAGNOSIS — N179 Acute kidney failure, unspecified: Principal | ICD-10-CM

## 2015-01-04 DIAGNOSIS — R7989 Other specified abnormal findings of blood chemistry: Secondary | ICD-10-CM

## 2015-01-04 DIAGNOSIS — I5033 Acute on chronic diastolic (congestive) heart failure: Secondary | ICD-10-CM

## 2015-01-04 DIAGNOSIS — G301 Alzheimer's disease with late onset: Secondary | ICD-10-CM

## 2015-01-04 LAB — COMPREHENSIVE METABOLIC PANEL
ALT: 70 U/L — ABNORMAL HIGH (ref 17–63)
AST: 40 U/L (ref 15–41)
Albumin: 3 g/dL — ABNORMAL LOW (ref 3.5–5.0)
Alkaline Phosphatase: 106 U/L (ref 38–126)
Anion gap: 16 — ABNORMAL HIGH (ref 5–15)
BUN: 111 mg/dL — ABNORMAL HIGH (ref 6–20)
CHLORIDE: 103 mmol/L (ref 101–111)
CO2: 24 mmol/L (ref 22–32)
Calcium: 8.8 mg/dL — ABNORMAL LOW (ref 8.9–10.3)
Creatinine, Ser: 4.21 mg/dL — ABNORMAL HIGH (ref 0.61–1.24)
GFR, EST AFRICAN AMERICAN: 15 mL/min — AB (ref >60)
GFR, EST NON AFRICAN AMERICAN: 13 mL/min — AB (ref >60)
Glucose, Bld: 191 mg/dL — ABNORMAL HIGH (ref 65–99)
POTASSIUM: 2.8 mmol/L — AB (ref 3.5–5.1)
SODIUM: 143 mmol/L (ref 135–145)
Total Bilirubin: 0.7 mg/dL (ref 0.3–1.2)
Total Protein: 6.8 g/dL (ref 6.5–8.1)

## 2015-01-04 LAB — GLUCOSE, CAPILLARY
GLUCOSE-CAPILLARY: 126 mg/dL — AB (ref 65–99)
GLUCOSE-CAPILLARY: 130 mg/dL — AB (ref 65–99)
GLUCOSE-CAPILLARY: 140 mg/dL — AB (ref 65–99)
GLUCOSE-CAPILLARY: 152 mg/dL — AB (ref 65–99)
GLUCOSE-CAPILLARY: 177 mg/dL — AB (ref 65–99)
Glucose-Capillary: 150 mg/dL — ABNORMAL HIGH (ref 65–99)

## 2015-01-04 LAB — PARATHYROID HORMONE, INTACT (NO CA): PTH: 34 pg/mL (ref 15–65)

## 2015-01-04 MED ORDER — POTASSIUM CHLORIDE CRYS ER 20 MEQ PO TBCR
40.0000 meq | EXTENDED_RELEASE_TABLET | Freq: Two times a day (BID) | ORAL | Status: AC
  Administered 2015-01-04 (×2): 40 meq via ORAL
  Filled 2015-01-04 (×2): qty 2

## 2015-01-04 MED ORDER — FREE WATER
400.0000 mL | Freq: Three times a day (TID) | Status: DC
  Administered 2015-01-04 – 2015-01-06 (×6): 400 mL

## 2015-01-04 MED ORDER — SODIUM CHLORIDE 0.9 % IV SOLN
125.0000 mg | Freq: Once | INTRAVENOUS | Status: AC
  Administered 2015-01-04: 125 mg via INTRAVENOUS
  Filled 2015-01-04: qty 10

## 2015-01-04 NOTE — Progress Notes (Signed)
[facility] ROUNDING NOTE   Subjective:   Interval History: Looks a little better today and has diminished LE edema  Objective:  Vital signs in last 24 hours:  Temp:  [97.6 F (36.4 C)-98.1 F (36.7 C)] 97.6 F (36.4 C) (12/24 0359) Pulse Rate:  [70-117] 70 (12/24 0359) Resp:  [20] 20 (12/24 0359) BP: (133-139)/(56-61) 133/57 mmHg (12/24 0359) SpO2:  [93 %-100 %] 96 % (12/24 0359) Weight:  [101.4 kg (223 lb 8.7 oz)] 101.4 kg (223 lb 8.7 oz) (12/24 0359)  Weight change: -3.1 kg (-6 lb 13.4 oz) Filed Weights   01/02/15 1900 01/04/15 0359  Weight: 104.5 kg (230 lb 6.1 oz) 101.4 kg (223 lb 8.7 oz)    Intake/Output: I/O last 3 completed shifts: In: 968.2 [P.O.:75; I.V.:591.7; NG/GT:301.5] Out: 4450 [Urine:4350; Stool:100]   Intake/Output this shift:     CVS- RRR RS- CTA  diminshed at bases ABD- BS present soft non-distended  PEG   And colostomy  EXT-  Trace edema   Basic Metabolic Panel:  Recent Labs Lab 01/02/15 1227 01/03/15 0120 01/03/15 1125 01/04/15 0542  NA 147* 142 149* 143  K 4.7 4.2 3.4* 2.8*  CL 113* 109 112* 103  CO2 18* 28 24 24   GLUCOSE 153* 121* 83 191*  BUN 104* 11 105* 111*  CREATININE 4.22* 0.89 4.05* 4.21*  CALCIUM 9.9 9.5 10.0 8.8*    Liver Function Tests:  Recent Labs Lab 01/02/15 1227 01/03/15 0120 01/04/15 0542  AST 61* 15 40  ALT 88* 13* 70*  ALKPHOS 173* 72 106  BILITOT 0.7 0.7 0.7  PROT 7.6 5.8* 6.8  ALBUMIN 3.6 3.1* 3.0*   No results for input(s): LIPASE, AMYLASE in the last 168 hours. No results for input(s): AMMONIA in the last 168 hours.  CBC:  Recent Labs Lab 01/02/15 1227 01/03/15 0120  WBC 13.3* 9.3  NEUTROABS 11.1*  --   HGB 9.2* 9.0*  HCT 28.6* 27.6*  MCV 80.8 80.2  PLT 208 211    Cardiac Enzymes:  Recent Labs Lab 01/02/15 1227 01/02/15 1820 01/03/15 0120 01/03/15 0802  TROPONINI 0.23* 0.22* 0.18* 0.18*    BNP: Invalid input(s): POCBNP  CBG:  Recent Labs Lab  01/03/15 1649 01/03/15 2022 01/04/15 0003 01/04/15 0352 01/04/15 0754  GLUCAP 169* 149* 152* 177* 150*    Microbiology: Results for orders placed or performed during the hospital encounter of 01/02/15  MRSA PCR Screening     Status: None   Collection Time: 01/02/15  4:55 PM  Result Value Ref Range Status   MRSA by PCR NEGATIVE NEGATIVE Final    Comment:        The GeneXpert MRSA Assay (FDA approved for NASAL specimens only), is one component of a comprehensive MRSA colonization surveillance program. It is not intended to diagnose MRSA infection nor to guide or monitor treatment for MRSA infections.     Coagulation Studies: No results for input(s): LABPROT, INR in the last 72 hours.  Urinalysis:  Recent Labs  01/02/15 1710  COLORURINE YELLOW  LABSPEC 1.013  PHURINE 5.0  GLUCOSEU NEGATIVE  HGBUR NEGATIVE  BILIRUBINUR NEGATIVE  KETONESUR NEGATIVE  PROTEINUR NEGATIVE  NITRITE NEGATIVE  LEUKOCYTESUR SMALL*      Imaging: Dg Chest 2 View  01/02/2015  CLINICAL DATA:  Weakness. The patient was reportedly diagnosed with pneumonia yesterday. EXAM: CHEST  2 VIEW COMPARISON:  Single view of the chest 09/23/2014 and single view of the chest 07/13/2012. FINDINGS: Lung volumes are low. There are  small bilateral pleural effusions. Indistinctness of the pulmonary vasculature is identified. No focal airspace disease is seen. Aortic atherosclerosis is noted. IMPRESSION: Appearance of the chest most compatible with congestive heart failure with associated small bilateral pleural effusions. Electronically Signed   By: Inge Rise M.D.   On: 01/02/2015 13:05   US Renal  01/02/2015  CLINICAL DATA:  Acute renal failure EXAM: RENAL / URINARY TRACT ULTRASOUND COMPLETE COMPARISON:  09/20/2014 FINDINGS: Right Kidney: Length: 12.0 cm. Cortical thinning. Mildly increased echotexture. No mass or hydronephrosis. Left Kidney: Length: 12.0 cm. Mild cortical thinning. Slight increased  echotexture. No mass or hydronephrosis Bladder: Appears normal for degree of bladder distention. IMPRESSION: Slight increased echotexture and cortical thinning bilaterally suggesting chronic medical renal disease, similar to prior study. No acute findings. Electronically Signed   By: Rolm Baptise M.D.   On: 01/02/2015 16:50   Dg Chest Port 1 View  01/03/2015  CLINICAL DATA:  69 year old male with weakness and shortness of breath. Reportedly diagnosed with pneumonia recently. Initial encounter. EXAM: PORTABLE CHEST 1 VIEW COMPARISON:  01/02/2015 and earlier. FINDINGS: Portable AP semi upright view at 1140 hours. Mildly lower lung volumes. Stable cardiomegaly and mediastinal contours. Continued increased interstitial opacity in both lungs. Small pleural effusions more apparent yesterday have not progressed. No pneumothorax. No consolidation. IMPRESSION: Stable radiographic appearance of the chest since yesterday with top differential considerations of pulmonary interstitial edema versus atypical/viral respiratory infection. Small pleural effusions. Electronically Signed   By: Genevie Ann M.D.   On: 01/03/2015 11:58     Medications:   . dextrose 125 mL/hr at 01/04/15 0915  . feeding supplement (OSMOLITE 1.5 CAL) 1,000 mL (01/03/15 1218)   . amLODipine  10 mg Oral Daily  . aspirin  81 mg Oral Daily  . atorvastatin  20 mg Oral Daily  . feeding supplement (PRO-STAT SUGAR FREE 64)  30 mL Per Tube Daily  . free water  50 mL Per Tube 3 times per day  . heparin  5,000 Units Subcutaneous 3 times per day  . hydrALAZINE  50 mg Oral TID PC & HS  . insulin aspart  0-5 Units Subcutaneous QHS  . insulin aspart  0-9 Units Subcutaneous TID WC  . insulin glargine  20 Units Subcutaneous QHS  . sertraline  150 mg Per Tube Daily  . sodium chloride  3 mL Intravenous Q12H  . tamsulosin  0.4 mg Oral Daily     Assessment/ Plan:   Chronic renal disease Stage 3/4  Serial creatinine , Is and Os And  discussed end of life issues with brother  Not candidate for long term outpatient dialysis in my opinion  Acute Kidney Injury  Serial Creatinine Is and Os   Foley placed  Urinalysis Pending  No nephrotoxins although no hemodynamic changes   Anemia  Iron stores  Low  Will replete  ESA if needed  Bones  PTH 34  Hypertension  Volume excess Lower extremity edema improved  Hypernatremia  Will stop lasix   Replete free water      LOS: 2 Sharlyn Odonnel W @TODAY @9 :25 AM

## 2015-01-04 NOTE — Plan of Care (Signed)
Problem: Pain Managment: Goal: General experience of comfort will improve Outcome: Progressing Pt will  be able to verbalize if he is in pain.

## 2015-01-04 NOTE — Progress Notes (Signed)
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 Acute on CKD3 with hypernatremia -Baseline Cr around 2.5. Presenting Cr 4.22 with BUN of 104 -was volume overloaded on admission, Renal consulted, IV lasix given then developed hypernatremia and hence started on d5W and Lasix stopped -4.3L negative -Per Renal not a good HD candidate -Replace K -will consult Palliative this admission  2. Acute on Chronic diastolic CHF -see above, EF is normal , grade 1 diastolic dysfunction  3. DM2       -stable, continue Lantus 20 and sliding scale coverage with 3 units with meals  4. Alzheimer's dementia -Continue sertraline 150 daily   5. Elevated troponin       -due to demand likely from volume overload, worsened by renal failure -non ACS pattern, troponin trend is flat -continue ASA/statin  6. HTN -continue amlodipine 10, hydralazine 3 times a day   7. History of colon cancer status post colectomy and ileostomy    8.    DYsphagia          -continue PEG feeds, also on Diet per SNF, will ask SLP to eval  DVT prophylaxis- Heparin subQ  Full Code Family: none at bedside, will contact sister today Dispo: SNF when stable   Past medical history-As per Problem list Chart reviewed as below-   Consultants:  nephrology  Procedures:  renal ultrasound  Antibiotics:  None   Subjective   No distress, unable to communicate with me, no events overnight   Objective    Interim History:   Telemetry: sinus rhythm   Objective: Filed Vitals:   01/03/15 1231 01/03/15 1335 01/03/15 2022 01/04/15 0359  BP:  139/56 138/61 133/57  Pulse:  117 71 70    Temp:  98.1 F (36.7 C) 97.6 F (36.4 C) 97.6 F (36.4 C)  TempSrc:  Oral Oral Oral  Resp:  20 20 20   Height:      Weight:    101.4 kg (223 lb 8.7 oz)  SpO2: 94% 95% 93% 96%    Intake/Output Summary (Last 24 hours) at 01/04/15 0946 Last data filed at 01/03/15 2027  Gross per 24 hour  Intake 893.17 ml  Output   2100 ml  Net -1206.83 ml    Exam:  General: Alert, awake, mumbles, no comprehensible words Cardiovascular: S1-S2 no murmur rub or gallop Respiratory: CTAB Abdomen: soft nontender nondistended no rebound, Peg tube and colostomy noted S[place]rade 1 lower extremity edema, chronic leg changes Neuro intact but confused able to move all 4 limbs on command  Data Reviewed: Basic Metabolic Panel:  Recent Labs Lab 01/02/15 1227 01/03/15 0120 01/03/15 1125 01/04/15 0542  NA 147* 142 149* 143  K 4.7 4.2 3.4* 2.8*  CL 113* 109 112* 103  CO2 18* 28 24 24   GLUCOSE 153* 121* 83 191*  BUN 104* 11 105* 111*  CREATININE 4.22* 0.89 4.05* 4.21*  CALCIUM 9.9 9.5 10.0 8.8*   Liver Function Tests:  Recent Labs Lab 01/02/15 1227 01/03/15 0120 01/04/15 0542  AST 61* 15 40  ALT 88* 13* 70*  ALKPHOS 173* 72 106  BILITOT 0.7 0.7 0.7  PROT 7.6 5.8* 6.8  ALBUMIN 3.6 3.1* 3.0*   No results for input(s):  LIPASE, AMYLASE in the last 168 hours. No results for input(s): AMMONIA in the last 168 hours. CBC:  Recent Labs Lab 01/02/15 1227 01/03/15 0120  WBC 13.3* 9.3  NEUTROABS 11.1*  --   HGB 9.2* 9.0*  HCT 28.6* 27.6*  MCV 80.8 80.2  PLT 208 211   Cardiac Enzymes:  Recent Labs Lab 01/02/15 1227 01/02/15 1820 01/03/15 0120 01/03/15 0802  TROPONINI 0.23* 0.22* 0.18* 0.18*   BNP: Invalid input(s): POCBNP CBG:  Recent Labs Lab 01/03/15 1649 01/03/15 2022 01/04/15 0003 01/04/15 0352 01/04/15 0754  GLUCAP 169* 149* 152* 177* 150*    Recent Results (from the past 240 hour(s))  MRSA PCR Screening     Status: None   Collection Time: 01/02/15  4:55 PM   Result Value Ref Range Status   MRSA by PCR NEGATIVE NEGATIVE Final    Comment:        The GeneXpert MRSA Assay (FDA approved for NASAL specimens only), is one component of a comprehensive MRSA colonization surveillance program. It is not intended to diagnose MRSA infection nor to guide or monitor treatment for MRSA infections.      Studies:              All Imaging reviewed and is as per above notation   Scheduled Meds: . amLODipine  10 mg Oral Daily  . aspirin  81 mg Oral Daily  . atorvastatin  20 mg Oral Daily  . feeding supplement (PRO-STAT SUGAR FREE 64)  30 mL Per Tube Daily  . ferric gluconate (FERRLECIT/NULECIT) IV  125 mg Intravenous Once  . free water  50 mL Per Tube 3 times per day  . heparin  5,000 Units Subcutaneous 3 times per day  . hydrALAZINE  50 mg Oral TID PC & HS  . insulin aspart  0-5 Units Subcutaneous QHS  . insulin aspart  0-9 Units Subcutaneous TID WC  . insulin glargine  20 Units Subcutaneous QHS  . potassium chloride  40 mEq Oral BID  . sertraline  150 mg Per Tube Daily  . sodium chloride  3 mL Intravenous Q12H  . tamsulosin  0.4 mg Oral Daily   Continuous Infusions: . feeding supplement (OSMOLITE 1.5 CAL) 1,000 mL (01/03/15 1218)     Assessment/Plan:      7. Acute on CKD3 with hypernatremia 1. Baseline Cr around 2.5. Presenting Cr 4.22 with BUN of 104 2. Pt has evidence of metabolic acidosis 3. appreciate Nephrology assistance-poor overall candidate for dialysis 4. Monitor renal function and for resolution however if not consider palliative care inputin the next 24-48 hours 5. nephrology changed him over to dextrose 5% 125 cc per hour 8. Acute on Chronic diastolic CHF 1. Evidence of gross volume overload 2. Lasix discontinued 3. get desats screen and monitor as may need Lasix in the next 1-2 days dependent on  respiratory status 9. DM2 1. Cont on SSI coverage 2. blood sugars ranging between 76 and 125 3. Continue Lantus 20 and  sliding scale coverage with 3 units with meals 10. Alzheimer's dementia 1. profound, reasonable to discontinue Aricept as would not make any specific change. Continue sertraline 150 dailyfor behavioral disturbances  11. Elevated troponin unlikely to be acute coronary syndrome 1. Mild trop leak , likely secondary to worsened renal failure 2. troponin trend is flat 12. HTN 1. BP stable on amlodipine 10, hydralazine 3 times a day   7.   History of colon cancer status post colectomy and ileostomy201 1. continue PEG  feeds and have reinitiated them 12/23 8.   DVT prophylaxis     Heparin subQ   01/04/2015, 9:46 AM    LOS: 2 days

## 2015-01-04 NOTE — Evaluation (Signed)
Clinical/Bedside Swallow Evaluation Patient Details  Name: [name] MRN: [id number] Date of Birth: 03-23-1945  Today's Date: 01/04/2015 Time: SLP Start Time (ACUTE ONLY): 1445 SLP Stop Time (ACUTE ONLY): 1504 SLP Time Calculation (min) (ACUTE ONLY): 19 min  Past Medical History:  Past Medical History  Diagnosis Date  . Hyponatremia   . Hypokalemia   . Anemia   . Diabetes mellitus without complication ([place])   . Hypertension   . UTI (lower urinary tract infection)   . Delusional disorder ([place])   . HOH (hard of hearing)   . Hyperlipidemia   . CHF (congestive heart failure) ([place])   . Renal insufficiency   . Stroke ([place])   . Dementia   . Depression   . Dysphagia   . Falls frequently   . Colon cancer ([place]) 2009    s/p Colectomy  . Alzheimer's dementia   . A-fib [facility])    Past Surgical History:  Past Surgical History  Procedure Laterality Date  . Colon surgery    . Colectomy  09/06/2007  . Ileostomy    . Peg tube removal    . Cardiac catheterization    . Cholecystectomy    . 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 Admitted with progressive fatigue and volume overload. Being fed by PEG tube with plan to start PO. Sister reprots that pt does eat and drink, though he stopped recently when he became sick. Last MBS on 07/08/12 reports oral dysphagia, delayed swallow, stasis with solids. No aspiration seen though did have a history of silent aspiration. He was recommended to consume liquid diet. Sister reports pt drink thin liquids and eats solids - bacon.    Assessment / Plan / Recommendation Clinical Impression  Pt demonstrates subtle signs of dysphagia, though function likely improved since last MBS. No overt signs of aspiration seen with any consistency though  subjectively, laryngeal elevation seemed slightly weak and swallow delayed. Questionable wet vocal quality after several bites of puree concerning for stasis. Pt hard of hearing and hearing aids at home, max verbal and visual cues effective for pt follow commands. Pt was unable to masticate soft fruit and food was removed from his mouth, in contrast to sister's report that pt was eating bacon. Recommend pt consume a full liquid diet with ongoing PEG tube feeds with f/u on Monday and consideration of MBS prior to d/c.     Aspiration Risk  Moderate aspiration risk    Diet Recommendation Thin liquid   Liquid Administration via: Cup;Straw Medication Administration: Via alternative means Supervision: Full supervision/cueing for compensatory strategies Compensations: Slow rate;Small sips/bites Postural Changes: Seated upright at 90 degrees    Other  Recommendations Oral Care Recommendations: Oral care BID   Follow up Recommendations  Skilled Nursing facility    Frequency and Duration min 2x/week  2 weeks       Prognosis Prognosis for Safe Diet Advancement: Fair      Swallow Study   General HPI: 69 y/o M with Type II DM, HTN, CKD 4, Diastolic heart failure, Dementia, hard of hearing, Severe oropharyngeal dysphagia status post PEG tube placement 07/2012, Colon Ca status post colectomy and ileostomy 04/2009. Admitted with progressive fatigue and volume overload. Being fed by PEG tube with plan to start PO. Sister reprots that  pt does eat and drink, though he stopped recently when he became sick. Last MBS on 07/08/12 reports oral dysphagia, delayed swallow, stasis with solids. No aspiration seen though did have a history of silent aspiration. He was recommended to consume liquid diet. Sister reports pt drink thin liquids and eats solids - bacon.  Type of Study: Bedside Swallow Evaluation Previous Swallow Assessment: see HPI Diet Prior to this Study: Dysphagia 3 (soft);Thin liquids;PEG  tube Temperature Spikes Noted: No Respiratory Status: Nasal cannula History of Recent Intubation: No Behavior/Cognition: Alert;Requires cueing Oral Cavity Assessment: Within Functional Limits Oral Care Completed by SLP: No Oral Cavity - Dentition: Edentulous Vision: Functional for self-feeding Self-Feeding Abilities: Needs assist Patient Positioning: Upright in bed Baseline Vocal Quality: Low vocal intensity Volitional Cough: Cognitively unable to elicit Volitional Swallow: Unable to elicit    Oral/Motor/Sensory Function Overall Oral Motor/Sensory Function: Within functional limits   Ice Chips     Thin Liquid Thin Liquid: Impaired Presentation: Straw;Cup Pharyngeal  Phase Impairments: Suspected delayed Swallow;Decreased hyoid-laryngeal movement    Nectar Thick Nectar Thick Liquid: Not tested   Honey Thick Honey Thick Liquid: Not tested   Puree Puree: Impaired Presentation: Spoon Pharyngeal Phase Impairments: Suspected delayed Swallow;Decreased hyoid-laryngeal movement;Wet Vocal Quality   Solid Solid: Impaired Presentation: Spoon Oral Phase Impairments: Impaired mastication Oral Phase Functional Implications: Impaired mastication      Herbie [place], MA CCC-SLP [phone]  Lynann Beaver 01/04/2015,3:09 PM

## 2015-01-05 LAB — GLUCOSE, CAPILLARY
GLUCOSE-CAPILLARY: 101 mg/dL — AB (ref 65–99)
GLUCOSE-CAPILLARY: 160 mg/dL — AB (ref 65–99)
GLUCOSE-CAPILLARY: 176 mg/dL — AB (ref 65–99)
Glucose-Capillary: 180 mg/dL — ABNORMAL HIGH (ref 65–99)

## 2015-01-05 LAB — BASIC METABOLIC PANEL
Anion gap: 14 (ref 5–15)
BUN: 105 mg/dL — AB (ref 6–20)
CHLORIDE: 106 mmol/L (ref 101–111)
CO2: 25 mmol/L (ref 22–32)
CREATININE: 3.99 mg/dL — AB (ref 0.61–1.24)
Calcium: 9 mg/dL (ref 8.9–10.3)
GFR calc non Af Amer: 14 mL/min — ABNORMAL LOW (ref >60)
GFR, EST AFRICAN AMERICAN: 16 mL/min — AB (ref >60)
Glucose, Bld: 154 mg/dL — ABNORMAL HIGH (ref 65–99)
Potassium: 3.2 mmol/L — ABNORMAL LOW (ref 3.5–5.1)
SODIUM: 145 mmol/L (ref 135–145)

## 2015-01-05 LAB — CBC
HCT: 28.5 % — ABNORMAL LOW (ref 39.0–52.0)
HEMOGLOBIN: 9.2 g/dL — AB (ref 13.0–17.0)
MCH: 25.6 pg — AB (ref 26.0–34.0)
MCHC: 32.3 g/dL (ref 30.0–36.0)
MCV: 79.4 fL (ref 78.0–100.0)
PLATELETS: 239 10*3/uL (ref 150–400)
RBC: 3.59 MIL/uL — AB (ref 4.22–5.81)
RDW: 17.4 % — ABNORMAL HIGH (ref 11.5–15.5)
WBC: 7.8 10*3/uL (ref 4.0–10.5)

## 2015-01-05 MED ORDER — POTASSIUM CHLORIDE CRYS ER 20 MEQ PO TBCR
40.0000 meq | EXTENDED_RELEASE_TABLET | Freq: Two times a day (BID) | ORAL | Status: AC
  Administered 2015-01-05 (×2): 40 meq via ORAL
  Filled 2015-01-05 (×2): qty 2

## 2015-01-05 NOTE — Progress Notes (Signed)
[facility] ROUNDING NOTE   Subjective:   Interval History:  Appears better today   Objective:  Vital signs in last 24 hours:  Temp:  [98.1 F (36.7 C)-98.3 F (36.8 C)] 98.1 F (36.7 C) (12/25 0703) Pulse Rate:  [60-71] 71 (12/25 0703) Resp:  [16-20] 20 (12/25 0703) BP: (133-147)/(50-65) 133/65 mmHg (12/25 0703) SpO2:  [97 %-99 %] 97 % (12/25 0703) Weight:  [102.3 kg (225 lb 8.5 oz)] 102.3 kg (225 lb 8.5 oz) (12/25 0703)  Weight change:  Filed Weights   01/02/15 1900 01/04/15 0359 01/05/15 0703  Weight: 104.5 kg (230 lb 6.1 oz) 101.4 kg (223 lb 8.7 oz) 102.3 kg (225 lb 8.5 oz)    Intake/Output: I/O last 3 completed shifts: In: 34 [P.O.:460] Out: 2600 [Urine:2275; Stool:325]   Intake/Output this shift:     CVS- RRR RS- CTA diminshed at bases ABD- BS present soft non-distended PEG And colostomy  EXT- Trace edema   Basic Metabolic Panel:  Recent Labs Lab 01/02/15 1227 01/03/15 0120 01/03/15 1125 01/04/15 0542 01/05/15 0534  NA 147* 142 149* 143 145  K 4.7 4.2 3.4* 2.8* 3.2*  CL 113* 109 112* 103 106  CO2 18* 28 24 24 25   GLUCOSE 153* 121* 83 191* 154*  BUN 104* 11 105* 111* 105*  CREATININE 4.22* 0.89 4.05* 4.21* 3.99*  CALCIUM 9.9 9.5 10.0 8.8* 9.0    Liver Function Tests:  Recent Labs Lab 01/02/15 1227 01/03/15 0120 01/04/15 0542  AST 61* 15 40  ALT 88* 13* 70*  ALKPHOS 173* 72 106  BILITOT 0.7 0.7 0.7  PROT 7.6 5.8* 6.8  ALBUMIN 3.6 3.1* 3.0*   No results for input(s): LIPASE, AMYLASE in the last 168 hours. No results for input(s): AMMONIA in the last 168 hours.  CBC:  Recent Labs Lab 01/02/15 1227 01/03/15 0120 01/05/15 0534  WBC 13.3* 9.3 7.8  NEUTROABS 11.1*  --   --   HGB 9.2* 9.0* 9.2*  HCT 28.6* 27.6* 28.5*  MCV 80.8 80.2 79.4  PLT 208 211 239    Cardiac Enzymes:  Recent Labs Lab 01/02/15 1227 01/02/15 1820 01/03/15 0120 01/03/15 0802  TROPONINI 0.23* 0.22* 0.18* 0.18*    BNP: Invalid  input(s): POCBNP  CBG:  Recent Labs Lab 01/04/15 0754 01/04/15 1205 01/04/15 1559 01/04/15 2209 01/05/15 0828  GLUCAP 150* 126* 130* 140* 101*    Microbiology: Results for orders placed or performed during the hospital encounter of 01/02/15  MRSA PCR Screening     Status: None   Collection Time: 01/02/15  4:55 PM  Result Value Ref Range Status   MRSA by PCR NEGATIVE NEGATIVE Final    Comment:        The GeneXpert MRSA Assay (FDA approved for NASAL specimens only), is one component of a comprehensive MRSA colonization surveillance program. It is not intended to diagnose MRSA infection nor to guide or monitor treatment for MRSA infections.     Coagulation Studies: No results for input(s): LABPROT, INR in the last 72 hours.  Urinalysis:  Recent Labs  01/02/15 1710  COLORURINE YELLOW  LABSPEC 1.013  PHURINE 5.0  GLUCOSEU NEGATIVE  HGBUR NEGATIVE  BILIRUBINUR NEGATIVE  KETONESUR NEGATIVE  PROTEINUR NEGATIVE  NITRITE NEGATIVE  LEUKOCYTESUR SMALL*      Imaging: No results found.   Medications:   . feeding supplement (OSMOLITE 1.5 CAL) 1,000 mL (01/04/15 1223)   . amLODipine  10 mg Oral Daily  . aspirin  81 mg Oral Daily  .  atorvastatin  20 mg Oral Daily  . feeding supplement (PRO-STAT SUGAR FREE 64)  30 mL Per Tube Daily  . free water  400 mL Per Tube 3 times per day  . heparin  5,000 Units Subcutaneous 3 times per day  . hydrALAZINE  50 mg Oral TID PC & HS  . insulin aspart  0-5 Units Subcutaneous QHS  . insulin aspart  0-9 Units Subcutaneous TID WC  . insulin glargine  20 Units Subcutaneous QHS  . potassium chloride  40 mEq Oral BID  . sertraline  150 mg Per Tube Daily  . sodium chloride  3 mL Intravenous Q12H  . tamsulosin  0.4 mg Oral Daily     Assessment/ Plan:   Chronic renal disease Stage 3/4  Serial creatinine , Is and Os And discussed end of life issues with brother  Not candidate for long term outpatient  dialysis in my opinion  Acute Kidney Injury  Serial Creatinine Is and Os   Foley placed  Urinalysis Pending  No nephrotoxins although no hemodynamic changes   Anemia  Iron stores Low Will replete  ESA if needed  Bones  PTH 34  Hypertension  Volume excess Lower extremity edema improved  Hypernatremia   Improved         Hypokalemia                Replete      LOS: 3 Sacheen Arrasmith W @TODAY @2 :12 PM

## 2015-01-05 NOTE — Progress Notes (Addendum)
[name] [id number] DOB: 1945-06-01 DOA: 01/02/2015 PCP: Wenda Low, MD  Brief narrative: 70 y/o M with Type II DM, HTN, CKD 4, Diastolic heart failure, Dementia, hard of hearing, Severe oropharyngeal dysphagia status post PEG tube placement 07/2012, Colon Ca status post colectomy and ileostomy 04/2009 Admitted with progressive fatigue and volume overload  1. Acute on CKD3 with hypernatremia -Baseline Cr around 2.5. Presenting Cr 4.22 with BUN of 104 -was volume overloaded on admission, Renal consulted, IV lasix given then developed hypernatremia and hence started on d5W and Lasix stopped -4.3L negative -Per Renal not a good HD candidate -Replace K - Palliative consult for goals of care  2. Acute on Chronic diastolic CHF -see above, EF is normal , grade 1 diastolic dysfunction  3. DM2       -stable, continue Lantus 20 and sliding scale coverage with 3 units with meals  4. Alzheimer's dementia -Continue sertraline 150 daily   5. Elevated troponin       -due to demand likely from volume overload, worsened by renal failure -non ACS pattern, troponin trend is flat -continue ASA/statin  6. HTN -continue amlodipine 10, hydralazine 3 times a day   7. History of colon cancer status post colectomy and ileostomy    8.    DYsphagia          -continue PEG feeds, also on Diet per SNF, will ask SLP to eval  DVT prophylaxis- Heparin subQ  Full Code Family: none at bedside, d/w sister [place], recommended Palliative meeting due to Advanced Dementia, Debility, CKD 4, Dysphagia etc  Dispo: SNF when stable   Past medical history-As per Problem list Chart reviewed as below-   Consultants:  nephrology  Procedures:  renal ultrasound  Antibiotics:  None   Subjective   No distress, unable to communicate with me, no events overnight   Objective    Interim History:   Telemetry: sinus rhythm   Objective: Filed Vitals:   01/04/15 0359  01/04/15 1300 01/04/15 2148 01/05/15 0703  BP: 133/57 134/60 147/50 133/65  Pulse: 70 74 60 71  Temp: 97.6 F (36.4 C) 97.8 F (36.6 C) 98.3 F (36.8 C) 98.1 F (36.7 C)  TempSrc: Oral Oral Oral Oral  Resp: 20 20 16 20   Height:      Weight: 101.4 kg (223 lb 8.7 oz)   102.3 kg (225 lb 8.5 oz)  SpO2: 96% 95% 99% 97%    Intake/Output Summary (Last 24 hours) at 01/05/15 1049 Last data filed at 01/05/15 0700  Gross per 24 hour  Intake    460 ml  Output   2150 ml  Net  -1690 ml    Exam:  General: Alert, awake, mumbles, no comprehensible words Cardiovascular: S1-S2 no murmur rub or gallop Respiratory: CTAB Abdomen: soft nontender nondistended no rebound, Peg tube and colostomy noted Skingrade 1 lower extremity edema, chronic leg changes Neuro intact but confused able to move all 4 limbs on command  Data Reviewed: Basic Metabolic Panel:  Recent Labs Lab 01/02/15 1227 01/03/15 0120 01/03/15 1125 01/04/15 0542 01/05/15 0534  NA 147* 142 149* 143 145  K 4.7 4.2 3.4* 2.8* 3.2*  CL 113* 109 112* 103 106  CO2 18* 28 24 24 25   GLUCOSE 153* 121* 83 191* 154*  BUN 104* 11 105* 111* 105*  CREATININE 4.22* 0.89 4.05* 4.21* 3.99*  CALCIUM 9.9 9.5 10.0 8.8* 9.0   Liver Function Tests:  Recent Labs Lab 01/02/15 1227 01/03/15 0120 01/04/15 0542  AST 61* 15 40  ALT 88* 13* 70*  ALKPHOS 173* 72 106  BILITOT 0.7 0.7 0.7  PROT 7.6 5.8* 6.8  ALBUMIN 3.6 3.1* 3.0*   No results for input(s): LIPASE, AMYLASE in the last 168 hours. No results for input(s): AMMONIA in the last 168 hours. CBC:  Recent Labs Lab 01/02/15 1227 01/03/15 0120 01/05/15 0534  WBC 13.3* 9.3 7.8  NEUTROABS 11.1*  --   --   HGB 9.2* 9.0* 9.2*  HCT 28.6* 27.6* 28.5*  MCV 80.8 80.2 79.4  PLT 208 211 239   Cardiac Enzymes:  Recent Labs Lab 01/02/15 1227 01/02/15 1820 01/03/15 0120 01/03/15 0802  TROPONINI 0.23* 0.22* 0.18* 0.18*   BNP: Invalid input(s): POCBNP CBG:  Recent Labs Lab  01/04/15 0754 01/04/15 1205 01/04/15 1559 01/04/15 2209 01/05/15 0828  GLUCAP 150* 126* 130* 140* 101*    Recent Results (from the past 240 hour(s))  MRSA PCR Screening     Status: None   Collection Time: 01/02/15  4:55 PM  Result Value Ref Range Status   MRSA by PCR NEGATIVE NEGATIVE Final    Comment:        The GeneXpert MRSA Assay (FDA approved for NASAL specimens only), is one component of a comprehensive MRSA colonization surveillance program. It is not intended to diagnose MRSA infection nor to guide or monitor treatment for MRSA infections.      Studies:              All Imaging reviewed and is as per above notation   Scheduled Meds: . amLODipine  10 mg Oral Daily  . aspirin  81 mg Oral Daily  . atorvastatin  20 mg Oral Daily  . feeding supplement (PRO-STAT SUGAR FREE 64)  30 mL Per Tube Daily  . free water  400 mL Per Tube 3 times per day  . heparin  5,000 Units Subcutaneous 3 times per day  . hydrALAZINE  50 mg Oral TID PC & HS  . insulin aspart  0-5 Units Subcutaneous QHS  . insulin aspart  0-9 Units Subcutaneous TID WC  . insulin glargine  20 Units Subcutaneous QHS  . potassium chloride  40 mEq Oral BID  . sertraline  150 mg Per Tube Daily  . sodium chloride  3 mL Intravenous Q12H  . tamsulosin  0.4 mg Oral Daily   Continuous Infusions: . feeding supplement (OSMOLITE 1.5 CAL) 1,000 mL (01/04/15 1223)    01/05/2015, 10:49 AM    LOS: 3 days

## 2015-01-06 DIAGNOSIS — Z7189 Other specified counseling: Secondary | ICD-10-CM

## 2015-01-06 DIAGNOSIS — Z515 Encounter for palliative care: Secondary | ICD-10-CM

## 2015-01-06 DIAGNOSIS — R531 Weakness: Secondary | ICD-10-CM

## 2015-01-06 LAB — GLUCOSE, CAPILLARY
GLUCOSE-CAPILLARY: 158 mg/dL — AB (ref 65–99)
GLUCOSE-CAPILLARY: 175 mg/dL — AB (ref 65–99)
Glucose-Capillary: 177 mg/dL — ABNORMAL HIGH (ref 65–99)
Glucose-Capillary: 159 mg/dL — ABNORMAL HIGH (ref 65–99)

## 2015-01-06 MED ORDER — FREE WATER
300.0000 mL | Freq: Four times a day (QID) | Status: DC
  Administered 2015-01-06 – 2015-01-07 (×5): 300 mL

## 2015-01-06 MED ORDER — DEXTROSE 5 % IV SOLN
INTRAVENOUS | Status: AC
  Administered 2015-01-06: 75 mL via INTRAVENOUS
  Administered 2015-01-07: 01:00:00 via INTRAVENOUS

## 2015-01-06 MED ORDER — FREE WATER: 50.0000 mL | Freq: Three times a day (TID) | Status: DC

## 2015-01-06 NOTE — Progress Notes (Signed)
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 Acute on CKD3 with hypernatremia -Baseline Cr around 2.5. Presenting Cr 4.22 with BUN of 104 -was volume overloaded on admission, Renal consulted, IV lasix given then developed hypernatremia and hence started on d5W and Lasix stopped -4.3L negative -Per Renal not a good HD candidate -Replace K - Palliative consult for goals of care  2. Acute on Chronic diastolic CHF -see above, EF is normal , grade 1 diastolic dysfunction  3. DM2       -stable, continue Lantus 20 and sliding scale coverage with 3 units with meals  4. Alzheimer's dementia -Continue sertraline 150 daily   5. Elevated troponin       -due to demand likely from volume overload, worsened by renal failure -non ACS pattern, troponin trend is flat -continue ASA/statin  6. HTN -continue amlodipine 10, hydralazine 3 times a day   7. History of colon cancer status post colectomy and ileostomy    8.    DYsphagia          -continue PEG feeds, also on Diet per SNF, will ask SLP to eval  DVT prophylaxis- Heparin subQ  Full Code Family: none at bedside, d/w sister [place], recommended Palliative meeting due to Advanced Dementia, Debility, CKD 4, Dysphagia etc  Dispo: SNF when stable   Past medical history-As per Problem list Chart reviewed as below-   Consultants:  nephrology  Procedures:  renal ultrasound  Antibiotics:  None   Subjective   No distress, unable to communicate with me, no events overnight   Objective    Interim History:   Telemetry: sinus rhythm   Objective: Filed Vitals:   01/05/15 0703  01/05/15 2024 01/06/15 0500 01/06/15 0655  BP: 133/65 140/59  146/60  Pulse: 71 65  7  Temp: 98.1 F (36.7 C) 98.2 F (36.8 C)  98 F (36.7 C)  TempSrc: Oral Oral  Oral  Resp: 20 20  19   Height:      Weight: 102.3 kg (225 lb 8.5 oz)  102.4 kg (225 lb 12 oz)   SpO2: 97% 92%  93%    Intake/Output Summary (Last 24 hours) at 01/06/15 1250 Last data filed at 01/06/15 0703  Gross per 24 hour  Intake      0 ml  Output   2150 ml  Net  -2150 ml    Exam:  General: Alert, awake, mumbles, no comprehensible words Cardiovascular: S1-S2 no murmur rub or gallop Respiratory: CTAB Abdomen: soft nontender nondistended no rebound, Peg tube and colostomy noted S[place]rade 1 lower extremity edema, chronic leg changes Neuro intact but confused able to move all 4 limbs on command  Data Reviewed: Basic Metabolic Panel:  Recent Labs Lab 01/02/15 1227 01/03/15 0120 01/03/15 1125 01/04/15 0542 01/05/15 0534  NA 147* 142 149* 143 145  K 4.7 4.2 3.4* 2.8* 3.2*  CL 113* 109 112* 103 106  CO2 18* 28 24 24 25   GLUCOSE 153* 121* 83 191* 154*  BUN 104* 11 105* 111* 105*  CREATININE 4.22* 0.89 4.05* 4.21* 3.99*  CALCIUM 9.9 9.5 10.0 8.8* 9.0   Liver Function Tests:  Recent Labs Lab 01/02/15 1227 01/03/15 0120 01/04/15 0542  AST 61* 15 40  ALT 88* 13* 70*  ALKPHOS 173* 72 106  BILITOT 0.7 0.7 0.7  PROT 7.6 5.8* 6.8  ALBUMIN 3.6 3.1* 3.0*   No results for input(s): LIPASE, AMYLASE in the last 168 hours. No results for input(s): AMMONIA in the last 168 hours. CBC:  Recent Labs Lab 01/02/15 1227 01/03/15 0120 01/05/15 0534  WBC 13.3* 9.3 7.8  NEUTROABS 11.1*  --   --   HGB 9.2* 9.0* 9.2*  HCT 28.6* 27.6* 28.5*  MCV 80.8 80.2 79.4  PLT 208 211 239   Cardiac Enzymes:  Recent Labs Lab 01/02/15 1227 01/02/15 1820 01/03/15 0120 01/03/15 0802  TROPONINI 0.23* 0.22* 0.18* 0.18*   BNP: Invalid input(s): POCBNP CBG:  Recent Labs Lab 01/05/15 1326 01/05/15 1641  01/05/15 2217 01/06/15 0812 01/06/15 1203  GLUCAP 160* 180* 176* 158* 177*    Recent Results (from the past 240 hour(s))  MRSA PCR Screening     Status: None   Collection Time: 01/02/15  4:55 PM  Result Value Ref Range Status   MRSA by PCR NEGATIVE NEGATIVE Final    Comment:        The GeneXpert MRSA Assay (FDA approved for NASAL specimens only), is one component of a comprehensive MRSA colonization surveillance program. It is not intended to diagnose MRSA infection nor to guide or monitor treatment for MRSA infections.      Studies:              All Imaging reviewed and is as per above notation   Scheduled Meds: . amLODipine  10 mg Oral Daily  . aspirin  81 mg Oral Daily  . atorvastatin  20 mg Oral Daily  . feeding supplement (PRO-STAT SUGAR FREE 64)  30 mL Per Tube Daily  . free water  300 mL Per Tube 4 times per day  . heparin  5,000 Units Subcutaneous 3 times per day  . hydrALAZINE  50 mg Oral TID PC & HS  . insulin aspart  0-5 Units Subcutaneous QHS  . insulin aspart  0-9 Units Subcutaneous TID WC  . insulin glargine  20 Units Subcutaneous QHS  . sertraline  150 mg Per Tube Daily  . sodium chloride  3 mL Intravenous Q12H  . tamsulosin  0.4 mg Oral Daily   Continuous Infusions: . dextrose 75 mL (01/06/15 1144)  . 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 Acute on CKD4 with hypernatremia -Baseline Cr around 2.5. Presenting Cr 4.22 with BUN of 104 -was volume overloaded on admission, Renal consulted, IV lasix given  then developed hypernatremia and hence started on d5W and Lasix stopped -8L negative -Per Renal, he is not a Dialysis candidate -Replace K - Palliative consult for goals of care, family meeting today, based on my discussion, there is a degree of denial regarding his illnesses  8. Acute on Chronic diastolic CHF -see above, EF is normal , grade 1 diastolic dysfunction  9. DM2       -stable, continue Lantus 20 and sliding scale coverage with  3 units with meals  10. Alzheimer's dementia       -confused at baseline, mumbles -Continue sertraline 150 daily   11. Elevated troponin       -due to demand likely from volume overload, worsened by renal failure -non ACS pattern, troponin trend is flat -continue ASA/statin  12. HTN -continue amlodipine 10, hydralazine 3 times a day   7. History of colon cancer status post colectomy and ileostomy    8.    DYsphagia          -continue PEG feeds, also on Diet per SNF, SLP  eval completed, liq diet recommended, MBS prior to DC  DVT prophylaxis- Heparin subQ  Full Code Family: none at bedside, d/w sister [place] 12/25, recommended Palliative meeting due to Advanced Dementia, Debility, CKD 4, Dysphagia etc and ongoing decline and high risk of decompensation  Dispo: SNF when stable   Past medical history-As per Problem list Chart reviewed as below-   Consultants:  nephrology  Procedures:  renal ultrasound  Antibiotics:  None   Subjective   No distress, unable to communicate with me, no events overnight   Objective    Interim History:    Objective: Filed Vitals:   01/05/15 0703 01/05/15 2024 01/06/15 0500 01/06/15 0655  BP: 133/65 140/59  146/60  Pulse: 71 65  7  Temp: 98.1 F (36.7 C) 98.2 F (36.8 C)  98 F (36.7 C)  TempSrc: Oral Oral  Oral  Resp: 20 20  19   Height:      Weight: 102.3 kg (225 lb 8.5 oz)  102.4 kg (225 lb 12 oz)   SpO2: 97% 92%  93%    Intake/Output Summary (Last 24 hours) at  01/06/15 1251 Last data filed at 01/06/15 0703  Gross per 24 hour  Intake      0 ml  Output   2150 ml  Net  -2150 ml    Exam:  General: Alert, awake, mumbles, no comprehensible words Cardiovascular: S1-S2 no murmur rub or gallop Respiratory: CTAB Abdomen: soft nontender nondistended no rebound, Peg tube and colostomy noted S[place]rade 1 lower extremity edema, chronic leg changes Neuro intact but confused able to move all 4 limbs on command  Data Reviewed: Basic Metabolic Panel:  Recent Labs Lab 01/02/15 1227 01/03/15 0120 01/03/15 1125 01/04/15 0542 01/05/15 0534  NA 147* 142 149* 143 145  K 4.7 4.2 3.4* 2.8* 3.2*  CL 113* 109 112* 103 106  CO2 18* 28 24 24 25   GLUCOSE 153* 121* 83 191* 154*  BUN 104* 11 105* 111* 105*  CREATININE 4.22* 0.89 4.05* 4.21* 3.99*  CALCIUM 9.9 9.5 10.0 8.8* 9.0   Liver Function Tests:  Recent Labs Lab 01/02/15 1227 01/03/15 0120 01/04/15 0542  AST 61* 15 40  ALT 88* 13* 70*  ALKPHOS 173* 72 106  BILITOT 0.7 0.7 0.7  PROT 7.6 5.8* 6.8  ALBUMIN 3.6 3.1* 3.0*   No results for input(s): LIPASE, AMYLASE in the last 168 hours. No results for input(s): AMMONIA in the last 168 hours. CBC:  Recent Labs Lab 01/02/15 1227 01/03/15 0120 01/05/15 0534  WBC 13.3* 9.3 7.8  NEUTROABS 11.1*  --   --   HGB 9.2* 9.0* 9.2*  HCT 28.6* 27.6* 28.5*  MCV 80.8 80.2 79.4  PLT 208 211 239   Cardiac Enzymes:  Recent Labs Lab 01/02/15 1227 01/02/15 1820 01/03/15 0120 01/03/15 0802  TROPONINI 0.23* 0.22* 0.18* 0.18*   BNP: Invalid input(s): POCBNP CBG:  Recent Labs Lab 01/05/15 1326 01/05/15 1641 01/05/15 2217 01/06/15 0812 01/06/15 1203  GLUCAP 160* 180* 176* 158* 177*    Recent Results (from the past 240 hour(s))  MRSA PCR Screening     Status: None   Collection Time: 01/02/15  4:55 PM  Result Value Ref Range Status   MRSA by PCR NEGATIVE NEGATIVE Final    Comment:        The GeneXpert MRSA Assay (FDA approved for NASAL  specimens only), is one component of a comprehensive MRSA colonization surveillance program. It is not intended to diagnose MRSA infection nor to guide or monitor treatment for MRSA infections.      Studies:              All Imaging reviewed and is as per above notation   Scheduled Meds: . amLODipine  10 mg Oral Daily  . aspirin  81 mg Oral Daily  . atorvastatin  20 mg Oral Daily  . feeding supplement (PRO-STAT SUGAR FREE 64)  30 mL Per Tube Daily  . free water  300 mL Per Tube 4 times per day  . heparin  5,000 Units Subcutaneous 3 times per day  . hydrALAZINE  50 mg Oral TID PC & HS  . insulin aspart  0-5 Units Subcutaneous QHS  . insulin aspart  0-9 Units Subcutaneous TID WC  . insulin glargine  20 Units Subcutaneous QHS  . sertraline  150 mg Per Tube Daily  . sodium chloride  3 mL Intravenous Q12H  . tamsulosin  0.4 mg Oral Daily   Continuous Infusions: . dextrose 75 mL (01/06/15 1144)  . feeding supplement (OSMOLITE 1.5 CAL) 1,000 mL (01/06/15 0517)    01/06/2015, 12:51 PM    LOS: 4 days

## 2015-01-06 NOTE — Progress Notes (Signed)
[facility] ROUNDING NOTE   Subjective:   Na up 145 today.  Creat down 3.99. Pt not responding verbally  Objective:  Vital signs in last 24 hours:  Temp:  [98 F (36.7 C)-98.2 F (36.8 C)] 98 F (36.7 C) (12/26 0655) Pulse Rate:  [7-65] 7 (12/26 0655) Resp:  [19-20] 19 (12/26 0655) BP: (140-146)/(59-60) 146/60 mmHg (12/26 0655) SpO2:  [92 %-93 %] 93 % (12/26 0655) Weight:  [102.4 kg (225 lb 12 oz)] 102.4 kg (225 lb 12 oz) (12/26 0500)  Weight change:  Filed Weights   01/04/15 0359 01/05/15 0703 01/06/15 0500  Weight: 101.4 kg (223 lb 8.7 oz) 102.3 kg (225 lb 8.5 oz) 102.4 kg (225 lb 12 oz)    Intake/Output: I/O last 3 completed shifts: In: -  Out: 2825 [Urine:2175; Stool:650]   Intake/Output this shift:  Total I/O In: -  Out: 550 [Urine:450; Stool:100]  CVS- RRR =jvd no distress RS- CTA diminshed at bases ABD- BS present soft non-distended PEG And colostomy  EXT- Trace edema LE's NEU- nonverbal, opens eyes , follows some commands   Basic Metabolic Panel:  Recent Labs Lab 01/02/15 1227 01/03/15 0120 01/03/15 1125 01/04/15 0542 01/05/15 0534  NA 147* 142 149* 143 145  K 4.7 4.2 3.4* 2.8* 3.2*  CL 113* 109 112* 103 106  CO2 18* 28 24 24 25   GLUCOSE 153* 121* 83 191* 154*  BUN 104* 11 105* 111* 105*  CREATININE 4.22* 0.89 4.05* 4.21* 3.99*  CALCIUM 9.9 9.5 10.0 8.8* 9.0    Liver Function Tests:  Recent Labs Lab 01/02/15 1227 01/03/15 0120 01/04/15 0542  AST 61* 15 40  ALT 88* 13* 70*  ALKPHOS 173* 72 106  BILITOT 0.7 0.7 0.7  PROT 7.6 5.8* 6.8  ALBUMIN 3.6 3.1* 3.0*   No results for input(s): LIPASE, AMYLASE in the last 168 hours. No results for input(s): AMMONIA in the last 168 hours.  CBC:  Recent Labs Lab 01/02/15 1227 01/03/15 0120 01/05/15 0534  WBC 13.3* 9.3 7.8  NEUTROABS 11.1*  --   --   HGB 9.2* 9.0* 9.2*  HCT 28.6* 27.6* 28.5*  MCV 80.8 80.2 79.4  PLT 208 211 239    Cardiac Enzymes:  Recent  Labs Lab 01/02/15 1227 01/02/15 1820 01/03/15 0120 01/03/15 0802  TROPONINI 0.23* 0.22* 0.18* 0.18*    BNP: Invalid input(s): POCBNP  CBG:  Recent Labs Lab 01/05/15 0828 01/05/15 1326 01/05/15 1641 01/05/15 2217 01/06/15 0812  GLUCAP 101* 160* 180* 176* 158*    Microbiology: Results for orders placed or performed during the hospital encounter of 01/02/15  MRSA PCR Screening     Status: None   Collection Time: 01/02/15  4:55 PM  Result Value Ref Range Status   MRSA by PCR NEGATIVE NEGATIVE Final    Comment:        The GeneXpert MRSA Assay (FDA approved for NASAL specimens only), is one component of a comprehensive MRSA colonization surveillance program. It is not intended to diagnose MRSA infection nor to guide or monitor treatment for MRSA infections.     Coagulation Studies: No results for input(s): LABPROT, INR in the last 72 hours.  Urinalysis: No results for input(s): COLORURINE, LABSPEC, PHURINE, GLUCOSEU, HGBUR, BILIRUBINUR, KETONESUR, PROTEINUR, UROBILINOGEN, NITRITE, LEUKOCYTESUR in the last 72 hours.  Invalid input(s): APPERANCEUR    Imaging: No results found.   Medications:   . feeding supplement (OSMOLITE 1.5 CAL) 1,000 mL (01/06/15 0517)   . amLODipine  10 mg  Oral Daily  . aspirin  81 mg Oral Daily  . atorvastatin  20 mg Oral Daily  . feeding supplement (PRO-STAT SUGAR FREE 64)  30 mL Per Tube Daily  . free water  50 mL Per Tube 3 times per day  . heparin  5,000 Units Subcutaneous 3 times per day  . hydrALAZINE  50 mg Oral TID PC & HS  . insulin aspart  0-5 Units Subcutaneous QHS  . insulin aspart  0-9 Units Subcutaneous TID WC  . insulin glargine  20 Units Subcutaneous QHS  . sertraline  150 mg Per Tube Daily  . sodium chloride  3 mL Intravenous Q12H  . tamsulosin  0.4 mg Oral Daily     Assessment:   1 Acute renal failure improving 2 CKD 3/4, baseline creat 2.5  3 Hypernatremia still up 4 Dementia 5 Colon ca w colostomy 6  Dysphagia w PEG feeds  Plan - have increased PEG free water to 300 cc qid and added IV D5W overnight to get Na down. Creat improving. Not a dialysis candidate given severe comorbidities. No other suggestions. Will sign off, call as needed.   Kelly Splinter MD [employer] pager [phone]    cell [phone] 01/06/2015, 11:24 AM

## 2015-01-06 NOTE — Consult Note (Signed)
Consultation Note Date: 01/06/2015   Patient Name: [name]  DOB: [birth year]  MRN: [id number]  Age / Sex: 69 y.o., male  PCP: Wenda Low, MD Referring Physician: [name], MD  Reason for Consultation: Establishing goals of care    Clinical Assessment/Narrative:  69 y.o. male with a hx of DM2, htn, diastolic CHF stage 3 CKD who presented from facility/[facility] with increased fatigue and lethargy. Family also noticed increased swelling and subsequently brought pt to the ED. In the Ed, pt was noted to have Cr of 4.22 with BUN of 104 (baseline Cr around 2.5). Pt also noted to have BNP of 1544 with trop of 0.23. CXR was notable for findings suggestive of pulm edema with B pleural effusions  Admitted for further work-up and treatment  This NP Wadie Lessen reviewed medical records, received report from team, assessed the patient and then meet at the patient's bedside along with his six siblings, Barbie Banner is the sister is is the main support person,   to discuss diagnosis (multiople co-morbid ites;  specifically  to ESRD and poor candidate for OP dialysis,  prognosis, GOC, EOL wishes disposition and options.   A detailed discussion was had today regarding advanced directives.  Concepts specific to code status, artifical feeding and hydration, continued IV antibiotics and rehospitalization was had.  The difference between a aggressive medical intervention path  and a palliative comfort care path for this patient at this time was had.  Values and goals of care important to patient and family were attempted to be elicited.  Concept of Hospice and Palliative Care were discussed  Natural trajectory and expectations at EOL were discussed.  Questions and concerns addressed.  Hard Choices booklet left for review. Family encouraged to call with questions or concerns.  PMT will continue to support  holistically.  MOST form introduced  Primary Decision Maker: family will work as a Estate agent: none documented, we discussed importance of securing [place] - treat the treatable -discharge back to [facility] with hospice services in place -avoid re-hospitalization, family is aware of the expectation that patient will decompensate 2/2 to ESRD-hospice will be able to help family continue to navigate the EOL health care decisions this family will face. - no dialysis treatment  in the future    Code Status/Advance Care Planning:  Full code-strongly suggested DNR status knowing outcomes in similar patients     Code Status Orders        Start     Ordered   01/02/15 1518  Full code   Continuous     01/02/15 1517      Other Directives:None   MOST form introduced    Palliative Prophylaxis:   Aspiration, Bowel Regimen, Delirium Protocol, Frequent Pain Assessment, Oral Care and Turn Reposition  Additional Recommendations (Limitations, Scope, Preferences):  Avoid Hospitalization  Continue conversation regarding aggressiveness of medical interventions with hospice   Psycho-social/Spiritual:  Support System: [place] Desire for further Chaplaincy support:no Additional Recommendations: Education on Hospice  Prognosis:   Less than 6 months  Discharge Planning: [place] with Hospice   Chief Complaint/ Primary Diagnoses: Present on Admission:  . Acute on chronic diastolic congestive heart failure ([place]) . Acute renal failure superimposed on stage 3 chronic kidney disease ([place]) . Alzheimer's disease . Edema . Elevated troponin . Essential hypertension . Acute kidney injury ([place]) . ARF (acute renal failure) ([place])  I have reviewed the medical record, interviewed the  patient and family, and examined the patient. The following aspects are pertinent.  Past Medical History  Diagnosis Date  . Hyponatremia   . Hypokalemia   .  Anemia   . Diabetes mellitus without complication ([place])   . Hypertension   . UTI (lower urinary tract infection)   . Delusional disorder ([place])   . HOH (hard of hearing)   . Hyperlipidemia   . CHF (congestive heart failure) ([place])   . Renal insufficiency   . Stroke ([place])   . Dementia   . Depression   . Dysphagia   . Falls frequently   . Colon cancer ([place]) 2009    s/p Colectomy  . Alzheimer's dementia   . A-fib [facility])    Social History   Social History  . Marital Status: Single    Spouse Name: N/A  . Number of Children: N/A  . Years of Education: N/A   Social History Main Topics  . Smoking status: Never Smoker   . Smokeless tobacco: None  . Alcohol Use: No  . Drug Use: No  . Sexual Activity: Not Currently   Other Topics Concern  . None   Social History Narrative   No family history on file. Scheduled Meds: . amLODipine  10 mg Oral Daily  . aspirin  81 mg Oral Daily  . atorvastatin  20 mg Oral Daily  . feeding supplement (PRO-STAT SUGAR FREE 64)  30 mL Per Tube Daily  . free water  300 mL Per Tube 4 times per day  . heparin  5,000 Units Subcutaneous 3 times per day  . hydrALAZINE  50 mg Oral TID PC & HS  . insulin aspart  0-5 Units Subcutaneous QHS  . insulin aspart  0-9 Units Subcutaneous TID WC  . insulin glargine  20 Units Subcutaneous QHS  . sertraline  150 mg Per Tube Daily  . sodium chloride  3 mL Intravenous Q12H  . tamsulosin  0.4 mg Oral Daily   Continuous Infusions: . dextrose 75 mL (01/06/15 1144)  . feeding supplement (OSMOLITE 1.5 CAL) 1,000 mL (01/06/15 0517)   PRN Meds:. Medications Prior to Admission:  Prior to Admission medications   Medication Sig Start Date End Date Taking? Authorizing Provider  Amino Acids-Protein Hydrolys (FEEDING SUPPLEMENT, PRO-STAT SUGAR FREE 64,) LIQD Take 30 mLs by mouth 2 (two) times daily.   Yes Historical Provider, MD  amLODipine (NORVASC) 10 MG tablet Give 10 mg by tube daily.    Yes Historical Provider, MD   aspirin 81 MG chewable tablet Give 81 mg by tube daily.   Yes Historical Provider, MD  atorvastatin (LIPITOR) 20 MG tablet Give 20 mg by tube daily.   Yes Historical Provider, MD  cyanocobalamin (,VITAMIN B-12,) 1000 MCG/ML injection Inject 1,000 mcg into the muscle every 30 (thirty) days.   Yes Historical Provider, MD  donepezil (ARICEPT) 10 MG tablet Give 10 mg by tube at bedtime.    Yes Historical Provider, MD  ferrous sulfate 220 (44 FE) MG/5ML solution Place 330 mg into feeding tube daily.   Yes Historical Provider, MD  furosemide (LASIX) 40 MG tablet Give 40 mg by tube daily. Furosemide 40 BID for three days and then take 1 tablet daily starting on 01/04/15   Yes Historical Provider, MD  hydrALAZINE (APRESOLINE) 50 MG tablet Give 50 mg by tube 4 (four) times daily - after meals and at bedtime.    Yes Historical Provider, MD  insulin glargine (LANTUS) 100 UNIT/ML injection Inject 20 Units  into the skin at bedtime.   Yes Historical Provider, MD  insulin lispro (HUMALOG) 100 UNIT/ML injection Inject 2-12 Units into the skin 4 (four) times daily. MAR has Insulin given 4 times daily with meals. 201-250=2units, 251-300units=4units, 301-350=6units, 351-400=8units, 401-450=10units, >451=12 units   Yes Historical Provider, MD  moxifloxacin (AVELOX) 400 MG tablet Take 400 mg by mouth daily at 8 pm.   Yes Historical Provider, MD  Multiple Vitamin (MULTIVITAMIN) LIQD 5 mLs by PEG Tube route daily.    Yes Historical Provider, MD  pantoprazole (PROTONIX) 40 MG injection Inject 40 mg into the vein daily. 09/23/14  Yes Donita Brooks, NP  sertraline (ZOLOFT) 100 MG tablet Place 150 mg into feeding tube daily.   Yes Historical Provider, MD  tamsulosin (FLOMAX) 0.4 MG CAPS capsule Take 0.4 mg by mouth.   Yes Historical Provider, MD  traZODone (DESYREL) 50 MG tablet 25 mg by Feeding Tube route 2 (two) times daily.    Yes Historical Provider, MD  vitamin C (ASCORBIC ACID) 500 MG tablet Give 1,000 mg by tube 2  (two) times daily.    Yes Historical Provider, MD  antiseptic oral rinse (CPC / CETYLPYRIDINIUM CHLORIDE 0.05%) 0.05 % LIQD solution 7 mLs by Mouth Rinse route 2 (two) times daily. Patient not taking: Reported on 10/31/2014 09/23/14   Donita Brooks, NP  sertraline (ZOLOFT) 25 MG tablet Take 3 tablets (75 mg total) by mouth daily. Patient not taking: Reported on 01/02/2015 11/01/14   Caren Griffins, MD   No Known Allergies  Review of Systems  Unable to perform ROS   Physical Exam  Constitutional: Vital signs are normal. He appears well-developed. He appears ill.  HENT:  Mouth/Throat: Mucous membranes are normal. Abnormal dentition.  -family reports patient is very hard of hearing    Vital Signs: BP 146/60 mmHg  Pulse 7  Temp(Src) 98 F (36.7 C) (Oral)  Resp 19  Ht 5\' 7"  (1.702 m)  Wt 102.4 kg (225 lb 12 oz)  BMI 35.35 kg/m2  SpO2 93%  SpO2: SpO2: 93 % O2 Device:SpO2: 93 % O2 Flow Rate: .O2 Flow Rate (L/min): 4 L/min  IO: Intake/output summary:  Intake/Output Summary (Last 24 hours) at 01/06/15 1355 Last data filed at 01/06/15 0703  Gross per 24 hour  Intake      0 ml  Output   2150 ml  Net  -2150 ml    LBM: Last BM Date: 01/04/15 Baseline Weight: Weight: 104.5 kg (230 lb 6.1 oz) Most recent weight: Weight: 102.4 kg (225 lb 12 oz)      Palliative Assessment/Data:    Additional Data Reviewed:  CBC:    Component Value Date/Time   WBC 7.8 01/05/2015 0534   WBC 4.5 04/09/2009 0912   HGB 9.2* 01/05/2015 0534   HGB 11.0* 04/09/2009 0912   HCT 28.5* 01/05/2015 0534   HCT 34.0* 04/09/2009 0912   PLT 239 01/05/2015 0534   PLT 219 04/09/2009 0912   MCV 79.4 01/05/2015 0534   MCV 81* 04/09/2009 0912   NEUTROABS 11.1* 01/02/2015 1227   NEUTROABS 3.4 04/09/2009 0912   LYMPHSABS 0.6* 01/02/2015 1227   LYMPHSABS 0.8* 04/09/2009 0912   MONOABS 1.7* 01/02/2015 1227   EOSABS 0.1 01/02/2015 1227   EOSABS 0.1 04/09/2009 0912   BASOSABS 0.0 01/02/2015 1227    BASOSABS 0.0 04/09/2009 0912   Comprehensive Metabolic Panel:    Component Value Date/Time   NA 145 01/05/2015 0534   K 3.2* 01/05/2015 0534  CL 106 01/05/2015 0534   CO2 25 01/05/2015 0534   BUN 105* 01/05/2015 0534   CREATININE 3.99* 01/05/2015 0534   GLUCOSE 154* 01/05/2015 0534   CALCIUM 9.0 01/05/2015 0534   AST 40 01/04/2015 0542   ALT 70* 01/04/2015 0542   ALKPHOS 106 01/04/2015 0542   BILITOT 0.7 01/04/2015 0542   PROT 6.8 01/04/2015 0542   ALBUMIN 3.0* 01/04/2015 0542   Discussed with Dr Broadus John  Time In: 1300 Time Out: 1430 Time Total: 90 min Greater than 50%  of this time was spent counseling and coordinating care related to the above assessment and plan.  Signed by: Wadie Lessen, NP  Knox Royalty, NP  01/06/2015, 1:55 PM  Please contact Palliative Medicine Team phone at [phone] for questions and concerns.

## 2015-01-06 NOTE — Progress Notes (Signed)
Speech Language Pathology Treatment: Dysphagia  Patient Details Name: [name] MRN: [id number] DOB: Mar 10, 1945 Today's Date: 01/06/2015 Time: [id number] SLP Time Calculation (min) (ACUTE ONLY): 21 min  Assessment / Plan / Recommendation Clinical Impression  Pt sitting upright in bed - HOH and smiles frequently to this SLP's questions.  Pt willing to consume water via straw, graham cracker and peach.  As pt is edentulous and due to his dementia, he did not adequately masticate peach nor moist graham cracker.  He did expectorate both cracker/peach per SlP cue.   Pt accepts very small bites of solids only - 1/3 tsp - with delayed oral transiting and swallow but clearly desires to drink indicated by holding his own cup with water consumption.    No overt indication of aspiration however voice was intermittently hoarse/wet.  Cues to throat clear *verbally and mimicing* conducted by pt, but did not fully clear voice.  RN reports pt with decreased lung sounds and intake of liquids adequately.  He orally pockets all foods however per RN.     Recommend to continue full liquid diet with strict aspiration precautions.  At this time, do not recommend repeat MBS, as do not anticipate will change pt's outcomes.  Aspiration risk is chronic and pt has PEG for nutritional support.  No family present to educate to mitigation strategies at this time.  Will continue to follow up to educate family.      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 Admitted with progressive fatigue and volume overload. Being fed by PEG tube with plan to start PO. Sister reprots that pt does eat and drink, though he stopped recently when he became sick. Last MBS on 07/08/12 reports oral dysphagia, delayed swallow, stasis with solids. No aspiration seen  though did have a history of silent aspiration. He was recommended to consume liquid diet. Sister reports pt drink thin liquids and eats solids - bacon.       SLP Plan  Continue with current plan of care     Recommendations  Diet recommendations: Thin liquid (full liquids) Medication Administration: Via alternative means Compensations: Slow rate;Small sips/bites;Other (Comment) (clean mouth after meals) Postural Changes and/or Swallow Maneuvers: Seated upright 90 degrees;Upright 30-60 min after meal              Oral Care Recommendations: Oral care BID Follow up Recommendations: Skilled Nursing facility Plan: Continue with current plan of care   Luanna Salk, [facility] SLP [phone]

## 2015-01-06 NOTE — Progress Notes (Signed)
Nutrition Follow-up  DOCUMENTATION CODES:   Obesity unspecified  INTERVENTION:  - Continue Osmolite 1.5 @ 45 mL/hr with 30 mL Prostat once/day and 50 mL free water TID. This regimen is providing 1720 kcal, 83 grams of protein, and 923 mL free water. - RD will continue to monitor for needs  NUTRITION DIAGNOSIS:   Inadequate oral intake related to lethargy/confusion as evidenced by meal completion < 25%. -ongoing  GOAL:   Patient will meet greater than or equal to 90% of their needs -met with TF alone  MONITOR:   PO intake, TF tolerance, Weight trends, Labs, Skin, I & O's  ASSESSMENT:   69 y.o. male with a hx of DM2, htn, diastolic CHF stage 3 CKD who presented from facility with increased fatigue and lethargy. Family also noticed increased swelling and subsequently brought pt to the ED. In the Ed, pt was noted to have Cr of 4.22 with BUN of 104 (baseline Cr around 2.5). Pt also noted to have BNP of 1544 with trop of 0.23. CXR was notable for findings suggestive of pulm edema with B pleural effusions Pt was given 51m IV lasix in the ED, still awaiting response.   12/26 Diet changed to FLD 12/24 at 1503 with no intakes documented since that time. No family or visitors present at time of RD visit again this AM. Pt with PEG and he was receiving Osmolite 1.5 @ 45 mL/hr with 50 mL free water TID at time of visit. TF regimen is meeting needs. Weight down 5 kg since 12/22; likely fluid related given fluid overload and need for high-dose Lasix early in admission. Medications reviewed. Labs reviewed; CBGs: 101-177 mg/dL, L: 3.2 mmol/L, BUN/creatinine elevated but trending down, GFR: 16. Hypernatremia has been resolved.   12/23 - Pt ate 5% of dinner last night, per chart review, and RD visualized breakfast tray with a few bites taken.  - Pt with hx of dementia and unable to provide information.  - No family or visitors present at time time.  - Spoke with RN who reports that she spoke with  [employer]or, quantityfrom the facility and pt was eating poorly PTA and was receiving Isosource 1.5 @ 100 mL/hr from 8PM-6AM (10 hours) with 30 mL Prostat BID.  - This regimen was providing 1700 kcal, 98 grams of protein, and 764 mL free water.  - This TF formula not available in hospital formulary so will order comparable TF formula - RN reports that pt pockets food but that he drinks fluids well.  - Physical assessment shows no muscle or fat wasting, mild edema. - Notes indicate pt with hx of stage 3 CKD and fluid overload on admission.  - Chart review indicates 4 lb weight gain in the past 2 months; will continue to monitor weight trends during hospitalization.   Diet Order:  Diet full liquid Room service appropriate?: Yes; Fluid consistency:: Thin  Skin:  Wound (see comment) (Incision with no documented location)  Last BM:  12/26  Height:   Ht Readings from Last 1 Encounters:  01/02/15 5' 7" (1.702 m)    Weight:   Wt Readings from Last 1 Encounters:  01/06/15 225 lb 12 oz (102.4 kg)    Ideal Body Weight:  67.27 kg (kg)  BMI:  Body mass index is 35.35 kg/(m^2).  Estimated Nutritional Needs:   Kcal:  1600-1800  Protein:  70-85 grams  Fluid:  1.3-1.5 L/day  EDUCATION NEEDS:   No education needs identified at this time  Jarome Matin, RD, LDN Inpatient Clinical Dietitian Pager # [phone] After hours/weekend pager # [phone]

## 2015-01-07 DIAGNOSIS — I10 Essential (primary) hypertension: Secondary | ICD-10-CM

## 2015-01-07 LAB — GLUCOSE, CAPILLARY
GLUCOSE-CAPILLARY: 137 mg/dL — AB (ref 65–99)
Glucose-Capillary: 152 mg/dL — ABNORMAL HIGH (ref 65–99)

## 2015-01-07 LAB — CBC
HEMATOCRIT: 28.8 % — AB (ref 39.0–52.0)
HEMOGLOBIN: 9.3 g/dL — AB (ref 13.0–17.0)
MCH: 25.5 pg — AB (ref 26.0–34.0)
MCHC: 32.3 g/dL (ref 30.0–36.0)
MCV: 79.1 fL (ref 78.0–100.0)
Platelets: 203 10*3/uL (ref 150–400)
RBC: 3.64 MIL/uL — AB (ref 4.22–5.81)
RDW: 17.3 % — ABNORMAL HIGH (ref 11.5–15.5)
WBC: 8.7 10*3/uL (ref 4.0–10.5)

## 2015-01-07 LAB — BASIC METABOLIC PANEL
Anion gap: 11 (ref 5–15)
BUN: 98 mg/dL — AB (ref 6–20)
CHLORIDE: 104 mmol/L (ref 101–111)
CO2: 22 mmol/L (ref 22–32)
CREATININE: 3.44 mg/dL — AB (ref 0.61–1.24)
Calcium: 8.8 mg/dL — ABNORMAL LOW (ref 8.9–10.3)
GFR calc Af Amer: 19 mL/min — ABNORMAL LOW (ref >60)
GFR calc non Af Amer: 17 mL/min — ABNORMAL LOW (ref >60)
GLUCOSE: 185 mg/dL — AB (ref 65–99)
POTASSIUM: 3.8 mmol/L (ref 3.5–5.1)
SODIUM: 137 mmol/L (ref 135–145)

## 2015-01-07 MED ORDER — FUROSEMIDE 40 MG PO TABS: 40.0000 mg | ORAL_TABLET | Freq: Two times a day (BID) | ORAL | Status: DC

## 2015-01-07 MED ORDER — OSMOLITE 1.5 CAL PO LIQD: 1000.0000 mL | ORAL | Status: DC

## 2015-01-07 MED ORDER — PRO-STAT SUGAR FREE PO LIQD: 30.0000 mL | Freq: Every day | ORAL | Status: DC

## 2015-01-07 MED ORDER — FREE WATER: 300.0000 mL | Freq: Four times a day (QID) | Status: AC

## 2015-01-07 NOTE — Progress Notes (Signed)
CSW following for return to [facility] when medically ready. CSW confirmed with Izola Price at [facility] that patient is a long term resident under Medicaid & can return under hospice services if needed. CSW has completed FL2 & will continue to follow and assist with return.    Raynaldo Opitz, [facility] Clinical Social Worker cell #: [phone]

## 2015-01-07 NOTE — Clinical Social Work Note (Signed)
CSW gave d/c packet to charge nurse, Baxter Flattery. CSW then followed-up with RN, Shirlee Limerick to ensure she received d/c packet. Pt will need [facility] for transportation to [facility]. CSW attempted to reach pt's sisters regarding d/c and transportation but was unsuccessful. CSW called [facility] to arrange transport.

## 2015-01-07 NOTE — Discharge Summary (Signed)
Physician Discharge Summary  [name] [id number] DOB: 30-Apr-1945 DOA: 01/02/2015  PCP: Wenda Low, MD  Admit date: 01/02/2015 Discharge date: 01/07/2015  Time spent: 45 minutes  Recommendations for Outpatient Follow-up:  1. Hospice to Follow at SNF 2. Needs continued discussions on Goals of care with family   Discharge Diagnoses:  Principal Problem:   Acute renal failure superimposed on stage 4 chronic kidney disease ([place])   Dysphagia   Alzheimer's disease   Type 2 diabetes mellitus ([place])   Edema   Acute on chronic diastolic congestive heart failure ([facility])   Elevated troponin   Essential hypertension   Congestive heart disease ([place])   Acute kidney injury ([place])   ARF (acute renal failure) ([place])   Adult failure to thrive  Discharge Condition: stable  Diet recommendation: Osmolite 1.5 , 1056ml per tube 89ml/hr continuous               Prostat sugar free 62ml per tube daily               Free water 326ml per tube 4 times a day Ok to have Liquid diet only by mouth   Filed Weights   01/05/15 0703 01/06/15 0500 01/07/15 0559  Weight: 102.3 kg (225 lb 8.5 oz) 102.4 kg (225 lb 12 oz) 104.3 kg (229 lb 15 oz)    History of present illness:  Chief Complaint: Fatigue  HPI: [name] is a 69 y.o. male with a hx of DM2, htn, diastolic CHF stage 4 CKD who presented from facility with increased fatigue and lethargy. Family also noticed increased swelling and subsequently brought pt to the ED. In the Ed, pt was noted to have Cr of 4.22 with BUN of 104 (baseline Cr around 2.5). Pt also noted to have BNP of 1544 with trop of 0.23. CXR was notable for findings suggestive of pulm edema with B pleural effusions   Hospital Course:  1. Acute on CKD3 with hypernatremia -Baseline Cr around 2.5. Presenting Cr 4.22 with BUN of 104, it is 3.9 at discharge -was volume overloaded on admission, Renal consulted, IV lasix given then developed hypernatremia and hence started on d5W  and Lasix stopped, diuresed 8L total -Per Renal not a HD candidate due to dementia, chronic illnesses, debility -Palliative consulted for goals of care, had family meeting yesterday, remains Full Code at this time, but decision made for Discharge back to his Long term facility with hospice, will need ongoing discussions on goals at SNF -Now on PO lasix at discharge  2. Acute on Chronic diastolic CHF -see above, EF is normal , grade 1 diastolic dysfunction -diuretics as above  3. DM2  -stable, continue Lantus 20 and sliding scale coverage   4. Alzheimer's dementia      -confused at baseline, total care -Continue sertraline 150 daily  5. Elevated troponin  -due to demand likely from volume overload, worsened by renal failure -non ACS pattern, troponin trend is flat -continue ASA/statin  6. HTN -continue amlodipine 10, hydralazine 3 times a day   7. History of colon cancer status post colectomy and ileostomy   8. DYsphagia  -continue PEG feeds, s/p SLP eval, started on liquids by mouth only             Consultations:  Palliative care  Renal  Discharge Exam: Filed Vitals:   01/06/15 2008 01/07/15 0559  BP: 149/49 154/52  Pulse: 74 79  Temp: 97.6 F (36.4 C) 97.7 F (36.5 C)  Resp: 20 22  General: alert, awake, confused Cardiovascular: S1S2/RRR Respiratory: CTAB  Discharge Instructions   Discharge Instructions    Discharge instructions    Complete by:  As directed   Liquid diet by mouth as needed, will continue to get most nutrition by PEG tube     Increase activity slowly    Complete by:  As directed           Current Discharge Medication List    START taking these medications   Details  !! Amino Acids-Protein Hydrolys (FEEDING SUPPLEMENT, PRO-STAT SUGAR FREE 64,) LIQD Place 30 mLs into feeding tube daily. Refills: 0    Nutritional Supplements (FEEDING SUPPLEMENT, OSMOLITE 1.5 CAL,) LIQD Place 1,000 mLs into  feeding tube continuous. Refills: 0    Water For Irrigation, Sterile (FREE WATER) SOLN Place 300 mLs into feeding tube every 6 (six) hours.     !! - Potential duplicate medications found. Please discuss with provider.    CONTINUE these medications which have CHANGED   Details  furosemide (LASIX) 40 MG tablet Place 1 tablet (40 mg total) into feeding tube 2 (two) times daily. Furosemide 40 BID for three days and then take 1 tablet daily starting on 01/04/15 Qty: 30 tablet      CONTINUE these medications which have NOT CHANGED   Details  !! Amino Acids-Protein Hydrolys (FEEDING SUPPLEMENT, PRO-STAT SUGAR FREE 64,) LIQD Take 30 mLs by mouth 2 (two) times daily.    amLODipine (NORVASC) 10 MG tablet Give 10 mg by tube daily.     aspirin 81 MG chewable tablet Give 81 mg by tube daily.    atorvastatin (LIPITOR) 20 MG tablet Give 20 mg by tube daily.    cyanocobalamin (,VITAMIN B-12,) 1000 MCG/ML injection Inject 1,000 mcg into the muscle every 30 (thirty) days.    donepezil (ARICEPT) 10 MG tablet Give 10 mg by tube at bedtime.     ferrous sulfate 220 (44 FE) MG/5ML solution Place 330 mg into feeding tube daily.    hydrALAZINE (APRESOLINE) 50 MG tablet Give 50 mg by tube 4 (four) times daily - after meals and at bedtime.     insulin glargine (LANTUS) 100 UNIT/ML injection Inject 20 Units into the skin at bedtime.    insulin lispro (HUMALOG) 100 UNIT/ML injection Inject 2-12 Units into the skin 4 (four) times daily. MAR has Insulin given 4 times daily with meals. 201-250=2units, 251-300units=4units, 301-350=6units, 351-400=8units, 401-450=10units, >451=12 units    Multiple Vitamin (MULTIVITAMIN) LIQD 5 mLs by PEG Tube route daily.     sertraline (ZOLOFT) 100 MG tablet Place 150 mg into feeding tube daily.    tamsulosin (FLOMAX) 0.4 MG CAPS capsule Take 0.4 mg by mouth.    traZODone (DESYREL) 50 MG tablet 25 mg by Feeding Tube route 2 (two) times daily.     vitamin C (ASCORBIC ACID)  500 MG tablet Give 1,000 mg by tube 2 (two) times daily.      !! - Potential duplicate medications found. Please discuss with provider.    STOP taking these medications     moxifloxacin (AVELOX) 400 MG tablet      pantoprazole (PROTONIX) 40 MG injection      antiseptic oral rinse (CPC / CETYLPYRIDINIUM CHLORIDE 0.05%) 0.05 % LIQD solution        No Known Allergies Follow-up Information    Follow up with [facility].   Specialty:  [place] information:   [place] [phone]  Follow up with Wenda Low, MD. Schedule an appointment as soon as possible for a visit in 1 week.   Specialty:  Internal Medicine   Contact information:   [address] [place] [address] [phone]        The results of significant diagnostics from this hospitalization (including imaging, microbiology, ancillary and laboratory) are listed below for reference.    Significant Diagnostic Studies: Dg Chest 2 View  01/02/2015  CLINICAL DATA:  Weakness. The patient was reportedly diagnosed with pneumonia yesterday. EXAM: CHEST  2 VIEW COMPARISON:  Single view of the chest 09/23/2014 and single view of the chest 07/13/2012. FINDINGS: Lung volumes are low. There are small bilateral pleural effusions. Indistinctness of the pulmonary vasculature is identified. No focal airspace disease is seen. Aortic atherosclerosis is noted. IMPRESSION: Appearance of the chest most compatible with congestive heart failure with associated small bilateral pleural effusions. Electronically Signed   By: Inge Rise M.D.   On: 01/02/2015 13:05   US Renal  01/02/2015  CLINICAL DATA:  Acute renal failure EXAM: RENAL / URINARY TRACT ULTRASOUND COMPLETE COMPARISON:  09/20/2014 FINDINGS: Right Kidney: Length: 12.0 cm. Cortical thinning. Mildly increased echotexture. No mass or hydronephrosis. Left Kidney: Length: 12.0 cm. Mild cortical  thinning. Slight increased echotexture. No mass or hydronephrosis Bladder: Appears normal for degree of bladder distention. IMPRESSION: Slight increased echotexture and cortical thinning bilaterally suggesting chronic medical renal disease, similar to prior study. No acute findings. Electronically Signed   By: Rolm Baptise M.D.   On: 01/02/2015 16:50   Dg Chest Port 1 View  01/03/2015  CLINICAL DATA:  69 year old male with weakness and shortness of breath. Reportedly diagnosed with pneumonia recently. Initial encounter. EXAM: PORTABLE CHEST 1 VIEW COMPARISON:  01/02/2015 and earlier. FINDINGS: Portable AP semi upright view at 1140 hours. Mildly lower lung volumes. Stable cardiomegaly and mediastinal contours. Continued increased interstitial opacity in both lungs. Small pleural effusions more apparent yesterday have not progressed. No pneumothorax. No consolidation. IMPRESSION: Stable radiographic appearance of the chest since yesterday with top differential considerations of pulmonary interstitial edema versus atypical/viral respiratory infection. Small pleural effusions. Electronically Signed   By: Genevie Ann M.D.   On: 01/03/2015 11:58    Microbiology: Recent Results (from the past 240 hour(s))  MRSA PCR Screening     Status: None   Collection Time: 01/02/15  4:55 PM  Result Value Ref Range Status   MRSA by PCR NEGATIVE NEGATIVE Final    Comment:        The GeneXpert MRSA Assay (FDA approved for NASAL specimens only), is one component of a comprehensive MRSA colonization surveillance program. It is not intended to diagnose MRSA infection nor to guide or monitor treatment for MRSA infections.      Labs: Basic Metabolic Panel:  Recent Labs Lab 01/03/15 0120 01/03/15 1125 01/04/15 0542 01/05/15 0534 01/07/15 0532  NA 142 149* 143 145 137  K 4.2 3.4* 2.8* 3.2* 3.8  CL 109 112* 103 106 104  CO2 28 24 24 25 22   GLUCOSE 121* 83 191* 154* 185*  BUN 11 105* 111* 105* 98*  CREATININE  0.89 4.05* 4.21* 3.99* 3.44*  CALCIUM 9.5 10.0 8.8* 9.0 8.8*   Liver Function Tests:  Recent Labs Lab 01/02/15 1227 01/03/15 0120 01/04/15 0542  AST 61* 15 40  ALT 88* 13* 70*  ALKPHOS 173* 72 106  BILITOT 0.7 0.7 0.7  PROT 7.6 5.8* 6.8  ALBUMIN 3.6 3.1* 3.0*   No results for input(s):  LIPASE, AMYLASE in the last 168 hours. No results for input(s): AMMONIA in the last 168 hours. CBC:  Recent Labs Lab 01/02/15 1227 01/03/15 0120 01/05/15 0534 01/07/15 0532  WBC 13.3* 9.3 7.8 8.7  NEUTROABS 11.1*  --   --   --   HGB 9.2* 9.0* 9.2* 9.3*  HCT 28.6* 27.6* 28.5* 28.8*  MCV 80.8 80.2 79.4 79.1  PLT 208 211 239 203   Cardiac Enzymes:  Recent Labs Lab 01/02/15 1227 01/02/15 1820 01/03/15 0120 01/03/15 0802  TROPONINI 0.23* 0.22* 0.18* 0.18*   BNP: BNP (last 3 results)  Recent Labs  09/22/14 0221 09/24/14 0806 01/02/15 1227  BNP 589.5* 160.5* 1544.1*    ProBNP (last 3 results) No results for input(s): PROBNP in the last 8760 hours.  CBG:  Recent Labs Lab 01/06/15 0812 01/06/15 1203 01/06/15 1710 01/06/15 2014 01/07/15 0751  GLUCAP 158* 177* 175* 159* 137*       SignedDomenic Polite MD  FACP  [facility] 01/07/2015, 11:30 AM

## 2015-01-11 DIAGNOSIS — R531 Weakness: Secondary | ICD-10-CM | POA: Insufficient documentation

## 2015-05-12 ENCOUNTER — Emergency Department (HOSPITAL_COMMUNITY): Payer: Medicare Other

## 2015-05-12 ENCOUNTER — Inpatient Hospital Stay (HOSPITAL_COMMUNITY)
Admission: EM | Admit: 2015-05-12 | Discharge: 2015-05-15 | DRG: 291 | Disposition: A | Discharge status: Skilled Nursing Facility | Payer: Medicare Other | Attending: Internal Medicine | Admitting: Internal Medicine

## 2015-05-12 ENCOUNTER — Encounter (HOSPITAL_COMMUNITY): Payer: Self-pay | Admitting: *Deleted

## 2015-05-12 DIAGNOSIS — J810 Acute pulmonary edema: Secondary | ICD-10-CM | POA: Diagnosis not present

## 2015-05-12 DIAGNOSIS — E876 Hypokalemia: Secondary | ICD-10-CM | POA: Diagnosis present

## 2015-05-12 DIAGNOSIS — Z85038 Personal history of other malignant neoplasm of large intestine: Secondary | ICD-10-CM | POA: Diagnosis not present

## 2015-05-12 DIAGNOSIS — F329 Major depressive disorder, single episode, unspecified: Secondary | ICD-10-CM | POA: Diagnosis present

## 2015-05-12 DIAGNOSIS — J9601 Acute respiratory failure with hypoxia: Secondary | ICD-10-CM | POA: Diagnosis present

## 2015-05-12 DIAGNOSIS — I5033 Acute on chronic diastolic (congestive) heart failure: Secondary | ICD-10-CM | POA: Diagnosis present

## 2015-05-12 DIAGNOSIS — I10 Essential (primary) hypertension: Secondary | ICD-10-CM | POA: Diagnosis present

## 2015-05-12 DIAGNOSIS — J189 Pneumonia, unspecified organism: Secondary | ICD-10-CM | POA: Diagnosis present

## 2015-05-12 DIAGNOSIS — E119 Type 2 diabetes mellitus without complications: Secondary | ICD-10-CM

## 2015-05-12 DIAGNOSIS — R4702 Dysphasia: Secondary | ICD-10-CM | POA: Diagnosis present

## 2015-05-12 DIAGNOSIS — N184 Chronic kidney disease, stage 4 (severe): Secondary | ICD-10-CM | POA: Diagnosis present

## 2015-05-12 DIAGNOSIS — F0280 Dementia in other diseases classified elsewhere without behavioral disturbance: Secondary | ICD-10-CM | POA: Diagnosis present

## 2015-05-12 DIAGNOSIS — R531 Weakness: Secondary | ICD-10-CM

## 2015-05-12 DIAGNOSIS — R1310 Dysphagia, unspecified: Secondary | ICD-10-CM | POA: Diagnosis present

## 2015-05-12 DIAGNOSIS — N189 Chronic kidney disease, unspecified: Secondary | ICD-10-CM | POA: Diagnosis not present

## 2015-05-12 DIAGNOSIS — G301 Alzheimer's disease with late onset: Secondary | ICD-10-CM | POA: Diagnosis not present

## 2015-05-12 DIAGNOSIS — I130 Hypertensive heart and chronic kidney disease with heart failure and stage 1 through stage 4 chronic kidney disease, or unspecified chronic kidney disease: Secondary | ICD-10-CM | POA: Diagnosis present

## 2015-05-12 DIAGNOSIS — Z794 Long term (current) use of insulin: Secondary | ICD-10-CM

## 2015-05-12 DIAGNOSIS — I4891 Unspecified atrial fibrillation: Secondary | ICD-10-CM | POA: Diagnosis present

## 2015-05-12 DIAGNOSIS — E875 Hyperkalemia: Secondary | ICD-10-CM | POA: Insufficient documentation

## 2015-05-12 DIAGNOSIS — Z931 Gastrostomy status: Secondary | ICD-10-CM | POA: Diagnosis not present

## 2015-05-12 DIAGNOSIS — Z8673 Personal history of transient ischemic attack (TIA), and cerebral infarction without residual deficits: Secondary | ICD-10-CM

## 2015-05-12 DIAGNOSIS — Z9049 Acquired absence of other specified parts of digestive tract: Secondary | ICD-10-CM | POA: Diagnosis not present

## 2015-05-12 DIAGNOSIS — Z79899 Other long term (current) drug therapy: Secondary | ICD-10-CM

## 2015-05-12 DIAGNOSIS — E1122 Type 2 diabetes mellitus with diabetic chronic kidney disease: Secondary | ICD-10-CM | POA: Diagnosis present

## 2015-05-12 DIAGNOSIS — G309 Alzheimer's disease, unspecified: Secondary | ICD-10-CM | POA: Diagnosis present

## 2015-05-12 DIAGNOSIS — E785 Hyperlipidemia, unspecified: Secondary | ICD-10-CM | POA: Diagnosis present

## 2015-05-12 DIAGNOSIS — R0602 Shortness of breath: Secondary | ICD-10-CM | POA: Diagnosis not present

## 2015-05-12 DIAGNOSIS — J9690 Respiratory failure, unspecified, unspecified whether with hypoxia or hypercapnia: Secondary | ICD-10-CM

## 2015-05-12 DIAGNOSIS — I5032 Chronic diastolic (congestive) heart failure: Secondary | ICD-10-CM | POA: Diagnosis present

## 2015-05-12 DIAGNOSIS — F22 Delusional disorders: Secondary | ICD-10-CM | POA: Diagnosis present

## 2015-05-12 DIAGNOSIS — N179 Acute kidney failure, unspecified: Secondary | ICD-10-CM | POA: Diagnosis present

## 2015-05-12 DIAGNOSIS — H9190 Unspecified hearing loss, unspecified ear: Secondary | ICD-10-CM | POA: Diagnosis present

## 2015-05-12 LAB — BASIC METABOLIC PANEL
ANION GAP: 17 — AB (ref 5–15)
Anion gap: 14 (ref 5–15)
BUN: 111 mg/dL — AB (ref 6–20)
BUN: 113 mg/dL — ABNORMAL HIGH (ref 6–20)
CHLORIDE: 109 mmol/L (ref 101–111)
CHLORIDE: 107 mmol/L (ref 101–111)
CO2: 16 mmol/L — ABNORMAL LOW (ref 22–32)
CO2: 18 mmol/L — AB (ref 22–32)
CREATININE: 3.36 mg/dL — AB (ref 0.61–1.24)
Calcium: 9.2 mg/dL (ref 8.9–10.3)
Calcium: 9.7 mg/dL (ref 8.9–10.3)
Creatinine, Ser: 3.35 mg/dL — ABNORMAL HIGH (ref 0.61–1.24)
GFR calc Af Amer: 20 mL/min — ABNORMAL LOW (ref >60)
GFR calc Af Amer: 20 mL/min — ABNORMAL LOW (ref >60)
GFR, EST NON AFRICAN AMERICAN: 17 mL/min — AB (ref >60)
GFR, EST NON AFRICAN AMERICAN: 17 mL/min — AB (ref >60)
GLUCOSE: 150 mg/dL — AB (ref 65–99)
GLUCOSE: 139 mg/dL — AB (ref 65–99)
POTASSIUM: 4.3 mmol/L (ref 3.5–5.1)
Potassium: 6.6 mmol/L (ref 3.5–5.1)
SODIUM: 139 mmol/L (ref 135–145)
Sodium: 142 mmol/L (ref 135–145)

## 2015-05-12 LAB — GLUCOSE, CAPILLARY
GLUCOSE-CAPILLARY: 123 mg/dL — AB (ref 65–99)
GLUCOSE-CAPILLARY: 105 mg/dL — AB (ref 65–99)
GLUCOSE-CAPILLARY: 52 mg/dL — AB (ref 65–99)
Glucose-Capillary: 125 mg/dL — ABNORMAL HIGH (ref 65–99)

## 2015-05-12 LAB — CBC WITH DIFFERENTIAL/PLATELET
Basophils Absolute: 0 10*3/uL (ref 0.0–0.1)
Basophils Relative: 0 %
EOS ABS: 0 10*3/uL (ref 0.0–0.7)
EOS PCT: 0 %
HCT: 28.2 % — ABNORMAL LOW (ref 39.0–52.0)
Hemoglobin: 9.1 g/dL — ABNORMAL LOW (ref 13.0–17.0)
LYMPHS ABS: 0.3 10*3/uL — AB (ref 0.7–4.0)
LYMPHS PCT: 4 %
MCH: 26.5 pg (ref 26.0–34.0)
MCHC: 32.3 g/dL (ref 30.0–36.0)
MCV: 82 fL (ref 78.0–100.0)
MONOS PCT: 11 %
Monocytes Absolute: 1 10*3/uL (ref 0.1–1.0)
NEUTROS ABS: 7.2 10*3/uL (ref 1.7–7.7)
NEUTROS PCT: 85 %
PLATELETS: 306 10*3/uL (ref 150–400)
RBC: 3.44 MIL/uL — ABNORMAL LOW (ref 4.22–5.81)
RDW: 18.2 % — ABNORMAL HIGH (ref 11.5–15.5)
WBC: 8.5 10*3/uL (ref 4.0–10.5)

## 2015-05-12 LAB — I-STAT CHEM 8, ED
BUN: 116 mg/dL — AB (ref 6–20)
CHLORIDE: 110 mmol/L (ref 101–111)
CREATININE: 3.6 mg/dL — AB (ref 0.61–1.24)
Calcium, Ion: 1.19 mmol/L (ref 1.13–1.30)
Glucose, Bld: 143 mg/dL — ABNORMAL HIGH (ref 65–99)
HCT: 32 % — ABNORMAL LOW (ref 39.0–52.0)
Hemoglobin: 10.9 g/dL — ABNORMAL LOW (ref 13.0–17.0)
Potassium: 6.5 mmol/L (ref 3.5–5.1)
Sodium: 137 mmol/L (ref 135–145)
TCO2: 16 mmol/L (ref 0–100)

## 2015-05-12 LAB — CBG MONITORING, ED: Glucose-Capillary: 157 mg/dL — ABNORMAL HIGH (ref 65–99)

## 2015-05-12 LAB — MRSA PCR SCREENING: MRSA BY PCR: POSITIVE — AB

## 2015-05-12 LAB — BLOOD GAS, ARTERIAL
ACID-BASE DEFICIT: 8.7 mmol/L — AB (ref 0.0–2.0)
BICARBONATE: 16 meq/L — AB (ref 20.0–24.0)
Drawn by: 103701
FIO2: 1
O2 SAT: 96.9 %
PATIENT TEMPERATURE: 98.6
PO2 ART: 97.8 mmHg (ref 80.0–100.0)
TCO2: 14.9 mmol/L (ref 0–100)
pCO2 arterial: 32.2 mmHg — ABNORMAL LOW (ref 35.0–45.0)
pH, Arterial: 7.318 — ABNORMAL LOW (ref 7.350–7.450)

## 2015-05-12 LAB — BRAIN NATRIURETIC PEPTIDE: B NATRIURETIC PEPTIDE 5: 1557 pg/mL — AB (ref 0.0–100.0)

## 2015-05-12 LAB — TROPONIN I: Troponin I: 0.04 ng/mL — ABNORMAL HIGH (ref <0.031)

## 2015-05-12 LAB — STREP PNEUMONIAE URINARY ANTIGEN: STREP PNEUMO URINARY ANTIGEN: NEGATIVE

## 2015-05-12 MED ORDER — VANCOMYCIN HCL IN DEXTROSE 1-5 GM/200ML-% IV SOLN
1000.0000 mg | INTRAVENOUS | Status: DC
  Filled 2015-05-12: qty 200

## 2015-05-12 MED ORDER — CETYLPYRIDINIUM CHLORIDE 0.05 % MT LIQD
7.0000 mL | Freq: Two times a day (BID) | OROMUCOSAL | Status: DC
  Administered 2015-05-12 – 2015-05-15 (×3): 7 mL via OROMUCOSAL

## 2015-05-12 MED ORDER — INSULIN ASPART 100 UNIT/ML ~~LOC~~ SOLN
6.0000 [IU] | Freq: Once | SUBCUTANEOUS | Status: AC
  Administered 2015-05-12: 6 [IU] via INTRAVENOUS
  Filled 2015-05-12: qty 1

## 2015-05-12 MED ORDER — DEXTROSE 50 % IV SOLN
1.0000 | Freq: Once | INTRAVENOUS | Status: AC
  Administered 2015-05-12: 50 mL via INTRAVENOUS
  Filled 2015-05-12: qty 50

## 2015-05-12 MED ORDER — SODIUM CHLORIDE 0.9 % IV SOLN: 250.0000 mL | INTRAVENOUS | Status: DC | PRN

## 2015-05-12 MED ORDER — INSULIN ASPART 100 UNIT/ML ~~LOC~~ SOLN
0.0000 [IU] | SUBCUTANEOUS | Status: DC
  Administered 2015-05-12 (×2): 2 [IU] via SUBCUTANEOUS
  Administered 2015-05-14: 11 [IU] via SUBCUTANEOUS
  Administered 2015-05-15: 2 [IU] via SUBCUTANEOUS

## 2015-05-12 MED ORDER — ONDANSETRON HCL 4 MG/2ML IJ SOLN: 4.0000 mg | Freq: Four times a day (QID) | INTRAMUSCULAR | Status: DC | PRN

## 2015-05-12 MED ORDER — SODIUM CHLORIDE 0.9% FLUSH
3.0000 mL | Freq: Two times a day (BID) | INTRAVENOUS | Status: DC
  Administered 2015-05-12 – 2015-05-15 (×5): 3 mL via INTRAVENOUS

## 2015-05-12 MED ORDER — CEFEPIME HCL 1 G IJ SOLR: 1.0000 g | Freq: Three times a day (TID) | INTRAMUSCULAR | Status: DC

## 2015-05-12 MED ORDER — CEFEPIME HCL 1 G IJ SOLR
1.0000 g | INTRAMUSCULAR | Status: DC
Start: 2015-05-13 — End: 2015-05-15
  Administered 2015-05-13 – 2015-05-15 (×3): 1 g via INTRAVENOUS
  Filled 2015-05-12 (×3): qty 1

## 2015-05-12 MED ORDER — FUROSEMIDE 10 MG/ML IJ SOLN
80.0000 mg | Freq: Once | INTRAMUSCULAR | Status: AC
  Administered 2015-05-12: 80 mg via INTRAVENOUS
  Filled 2015-05-12: qty 8

## 2015-05-12 MED ORDER — SODIUM CHLORIDE 0.9% FLUSH
3.0000 mL | Freq: Two times a day (BID) | INTRAVENOUS | Status: DC
Start: 2015-05-12 — End: 2015-05-15
  Administered 2015-05-14 (×2): 3 mL via INTRAVENOUS

## 2015-05-12 MED ORDER — SODIUM CHLORIDE 0.9% FLUSH: 3.0000 mL | INTRAVENOUS | Status: DC | PRN

## 2015-05-12 MED ORDER — SODIUM BICARBONATE 8.4 % IV SOLN
50.0000 meq | Freq: Once | INTRAVENOUS | Status: AC
  Administered 2015-05-12: 50 meq via INTRAVENOUS
  Filled 2015-05-12: qty 50

## 2015-05-12 MED ORDER — SODIUM CHLORIDE 0.9 % IV SOLN
1.0000 g | Freq: Once | INTRAVENOUS | Status: AC
  Administered 2015-05-12: 1 g via INTRAVENOUS
  Filled 2015-05-12: qty 10

## 2015-05-12 MED ORDER — DEXTROSE 50 % IV SOLN
50.0000 mL | Freq: Once | INTRAVENOUS | Status: AC
  Administered 2015-05-12: 50 mL via INTRAVENOUS
  Filled 2015-05-12: qty 50

## 2015-05-12 MED ORDER — CHLORHEXIDINE GLUCONATE 0.12 % MT SOLN
15.0000 mL | Freq: Two times a day (BID) | OROMUCOSAL | Status: DC
  Administered 2015-05-12 – 2015-05-15 (×6): 15 mL via OROMUCOSAL
  Filled 2015-05-12 (×6): qty 15

## 2015-05-12 MED ORDER — ONDANSETRON HCL 4 MG PO TABS: 4.0000 mg | ORAL_TABLET | Freq: Four times a day (QID) | ORAL | Status: DC | PRN

## 2015-05-12 MED ORDER — HEPARIN SODIUM (PORCINE) 5000 UNIT/ML IJ SOLN
5000.0000 [IU] | Freq: Three times a day (TID) | INTRAMUSCULAR | Status: DC
  Administered 2015-05-12 – 2015-05-15 (×10): 5000 [IU] via SUBCUTANEOUS
  Filled 2015-05-12 (×10): qty 1

## 2015-05-12 MED ORDER — CHLORHEXIDINE GLUCONATE CLOTH 2 % EX PADS
6.0000 | MEDICATED_PAD | Freq: Every day | CUTANEOUS | Status: DC
  Administered 2015-05-13 – 2015-05-15 (×3): 6 via TOPICAL

## 2015-05-12 MED ORDER — MUPIROCIN 2 % EX OINT
1.0000 "application " | TOPICAL_OINTMENT | Freq: Two times a day (BID) | CUTANEOUS | Status: DC
  Administered 2015-05-12 – 2015-05-15 (×6): 1 via NASAL
  Filled 2015-05-12 (×2): qty 22

## 2015-05-12 MED ORDER — MORPHINE SULFATE (PF) 2 MG/ML IV SOLN: 2.0000 mg | INTRAVENOUS | Status: DC | PRN

## 2015-05-12 MED ORDER — HYDRALAZINE HCL 50 MG PO TABS
50.0000 mg | ORAL_TABLET | Freq: Three times a day (TID) | ORAL | Status: DC
  Administered 2015-05-12 – 2015-05-15 (×12): 50 mg
  Filled 2015-05-12 (×14): qty 1

## 2015-05-12 MED ORDER — FUROSEMIDE 10 MG/ML IJ SOLN
40.0000 mg | Freq: Two times a day (BID) | INTRAMUSCULAR | Status: DC
  Administered 2015-05-13: 40 mg via INTRAVENOUS
  Filled 2015-05-12: qty 4

## 2015-05-12 MED ORDER — FREE WATER
300.0000 mL | Freq: Four times a day (QID) | Status: DC
  Administered 2015-05-12 – 2015-05-15 (×11): 300 mL

## 2015-05-12 MED ORDER — SODIUM POLYSTYRENE SULFONATE 15 GM/60ML PO SUSP
30.0000 g | Freq: Once | ORAL | Status: AC
  Administered 2015-05-12: 30 g via ORAL
  Filled 2015-05-12: qty 120

## 2015-05-12 MED ORDER — DEXTROSE 50 % IV SOLN
25.0000 mL | Freq: Once | INTRAVENOUS | Status: AC
  Administered 2015-05-12: 25 mL via INTRAVENOUS
  Filled 2015-05-12: qty 50

## 2015-05-12 MED ORDER — DEXTROSE 5 % IV SOLN
2.0000 g | Freq: Once | INTRAVENOUS | Status: AC
  Administered 2015-05-12: 2 g via INTRAVENOUS
  Filled 2015-05-12: qty 2

## 2015-05-12 MED ORDER — DEXTROSE-NACL 5-0.45 % IV SOLN: INTRAVENOUS | Status: DC

## 2015-05-12 MED ORDER — PANTOPRAZOLE SODIUM 40 MG IV SOLR
40.0000 mg | Freq: Every day | INTRAVENOUS | Status: DC
  Administered 2015-05-12: 40 mg via INTRAVENOUS
  Filled 2015-05-12 (×2): qty 40

## 2015-05-12 MED ORDER — VANCOMYCIN HCL IN DEXTROSE 1-5 GM/200ML-% IV SOLN
1000.0000 mg | Freq: Once | INTRAVENOUS | Status: AC
  Administered 2015-05-12: 1000 mg via INTRAVENOUS
  Filled 2015-05-12: qty 200

## 2015-05-12 NOTE — ED Notes (Addendum)
REPEAT EKG GIVEN TO DR. AN.

## 2015-05-12 NOTE — ED Notes (Signed)
1st set already in the lab

## 2015-05-12 NOTE — H&P (Addendum)
History and Physical    [name]:[id number] DOB: April 17, 1945 DOA: 05/12/2015  Referring MD/NP/PA:  PCP: Wenda Low, MD  Outpatient Specialists:  Patient coming from: [facility]  Chief Complaint: Shortness of breath  HPI: [name] is a 70 y.o. male with medical history significant of advanced dementia, type 2 diabetes mellitus, stage IV chronic kidney disease, I Stella congestive heart failure, poor functional status at baseline, resident at [facility], last hospitalized from 01/02/2015 through 01/07/2015 for acute on chronic renal failure and acute on chronic diastolic congestive heart failure. During that hospitalization he was seen by nephrology, now found to be candidate for hemodialysis. Palliative care met with family members at the time, family wishing to keep him full code. He presents to the emergency room today in acute respiratory failure. At his facility he was diagnosed with pneumonia and started on antibiotics. Overnight he had significant clinical deterioration, going into respiratory distress, hypoxemic. EMS called. History was obtained from family members over telephone conversation as well as from emergency room staff. Patient unable to provide history.   ED Course: He was found to be in respiratory failure in the emergency department requiring 100% nonrebreather. CT scan of the chest showing diffuse bilateral airspace disease which could reflect multifocal pneumonia or edema. He was started on broad-spectrum IV antimicrobial therapy with cefepime and IV vancomycin and given 80 mg of IV Lasix.  Review of Systems: As per HPI otherwise 10 point review of systems negative.   Past Medical History  Diagnosis Date  . Hyponatremia   . Hypokalemia   . Anemia   . Diabetes mellitus without complication ([place])   . Hypertension   . UTI (lower urinary tract infection)   . Delusional disorder ([place])   . HOH (hard of hearing)   . Hyperlipidemia   . CHF (congestive  heart failure) ([place])   . Renal insufficiency   . Stroke ([place])   . Dementia   . Depression   . Dysphagia   . Falls frequently   . Colon cancer ([place]) 2009    s/p Colectomy  . Alzheimer's dementia   . A-fib [facility])     Past Surgical History  Procedure Laterality Date  . Colon surgery    . Colectomy  09/06/2007  . Ileostomy    . Peg tube removal    . Cardiac catheterization    . Cholecystectomy    . Abdominal adhesion surgery       reports that he has never smoked. He does not have any smokeless tobacco history on file. He reports that he does not drink alcohol or use illicit drugs.  No Known Allergies  No family history on file. Cannot review family history with patient, he is unable to provide history  Prior to Admission medications   Medication Sig Start Date End Date Taking? Authorizing Provider  amLODipine (NORVASC) 10 MG tablet Place 10 mg into feeding tube daily. 04/23/15  Yes Historical Provider, MD  amoxicillin-clavulanate (AUGMENTIN) 875-125 MG tablet Give 1 tablet by tube 2 (two) times daily. 10 day therapy course patient to completed by 05/11/2015 05/02/15  Yes Historical Provider, MD  antiseptic oral rinse (BIOTENE) LIQD 15 mLs by Mouth Rinse route 2 (two) times daily.   Yes Historical Provider, MD  ascorbic acid (VITAMIN C) 1000 MG tablet Give 1,000 mg by tube 2 (two) times daily.   Yes Historical Provider, MD  aspirin 81 MG chewable tablet Give 81 mg by tube daily.   Yes Historical Provider,  MD  cyanocobalamin (,VITAMIN B-12,) 1000 MCG/ML injection Inject 1,000 mcg into the muscle every 30 (thirty) days.   Yes Historical Provider, MD  feeding supplement (BOOST HIGH PROTEIN) LIQD Take 1 Container by mouth 3 (three) times daily with meals.   Yes Historical Provider, MD  ferrous sulfate 220 (44 FE) MG/5ML solution Place 330 mg into feeding tube daily.   Yes Historical Provider, MD  furosemide (LASIX) 40 MG tablet Place 1 tablet (40 mg total) into feeding tube 2 (two) times  daily. Furosemide 40 BID for three days and then take 1 tablet daily starting on 01/04/15 Patient taking differently: Place 40 mg into feeding tube 2 (two) times daily.  01/07/15  Yes [name], MD  hydrALAZINE (APRESOLINE) 50 MG tablet Give 50 mg by tube 4 (four) times daily - after meals and at bedtime.    Yes Historical Provider, MD  insulin glargine (LANTUS) 100 UNIT/ML injection Inject 20 Units into the skin at bedtime.   Yes Historical Provider, MD  insulin lispro (HUMALOG) 100 UNIT/ML injection Inject 2-12 Units into the skin 4 (four) times daily. MAR has Insulin given 4 times daily with meals per patient 201-250=2units  251-300units=4units 301-350=6units  351-400=8units 401-450=10units >451=12 u   Yes Historical Provider, MD  Multiple Vitamin (MULTIVITAMIN) LIQD 5 mLs by PEG Tube route daily.    Yes Historical Provider, MD  Nutritional Supplements (FEEDING SUPPLEMENT, OSMOLITE 1.5 CAL,) LIQD Place 1,000 mLs into feeding tube continuous. 01/07/15  Yes [name], MD  pantoprazole sodium (PROTONIX) 40 mg/20 mL PACK Place 40 mg into feeding tube daily.   Yes Historical Provider, MD  sertraline (ZOLOFT) 100 MG tablet Place 150 mg into feeding tube daily.  04/01/15  Yes Historical Provider, MD  tamsulosin (FLOMAX) 0.4 MG CAPS capsule Take 0.4 mg by mouth.   Yes Historical Provider, MD  traZODone (DESYREL) 50 MG tablet 25 mg by Feeding Tube route at bedtime.    Yes Historical Provider, MD  Water For Irrigation, Sterile (FREE WATER) SOLN Place 300 mLs into feeding tube every 6 (six) hours. 01/07/15  Yes [name], MD    Physical Exam: Filed Vitals:   05/12/15 0403 05/12/15 0416 05/12/15 0700 05/12/15 0730  BP: 155/69  157/74 153/84  Pulse: 52  51   Temp: 97.7 F (36.5 C)     TempSrc: Oral     Resp: 20     SpO2: 99% 95% 100%       Constitutional: Chronically ill-appearing, in acute distress, has 100% nonrebreather, and respiratory failure. Filed Vitals:   05/12/15 0403  05/12/15 0416 05/12/15 0700 05/12/15 0730  BP: 155/69  157/74 153/84  Pulse: 52  51   Temp: 97.7 F (36.5 C)     TempSrc: Oral     Resp: 20     SpO2: 99% 95% 100%    Eyes: PERRL, lids and conjunctivae normal ENMT: Mucous membranes are moist. Posterior pharynx clear of any exudate or lesions.dry oral mucosa  Neck: normal, supple, no masses, no thyromegaly Respiratory: Patient in respiratory distress, having extensive bilateral rales and crackles currently on 100% nonrebreather,  Cardiovascular: Tachycardic, Regular rate and rhythm, no murmurs / rubs / gallops. Has bilateral pedal edema, positive jugular venous distention Abdomen: Status post colostomy, appears to be functioning well. Did not elicit abdominal tenderness to palpation. Positive bowel sounds Musculoskeletal: no clubbing / cyanosis. No joint deformity upper and lower extremities. Good ROM, no contractures. Normal muscle tone.  Skin: Venous stasis changes lower extremities Neurologic: Patient  is in respiratory distress, history of advanced dementia, unable to provide history, not following commands could not perform adequate neurologic examination Psychiatric: Patient with advanced dementia  Labs on Admission: I have personally reviewed following labs and imaging studies  CBC:  Recent Labs Lab 05/12/15 0525 05/12/15 0553  WBC 8.5  --   NEUTROABS 7.2  --   HGB 9.1* 10.9*  HCT 28.2* 32.0*  MCV 82.0  --   PLT 306  --    Basic Metabolic Panel:  Recent Labs Lab 05/12/15 0525 05/12/15 0553  NA 139 137  K 6.6* 6.5*  CL 109 110  CO2 16*  --   GLUCOSE 150* 143*  BUN 111* 116*  CREATININE 3.36* 3.60*  CALCIUM 9.2  --    GFR: CrCl cannot be calculated (Unknown ideal weight.). Liver Function Tests: No results for input(s): AST, ALT, ALKPHOS, BILITOT, PROT, ALBUMIN in the last 168 hours. No results for input(s): LIPASE, AMYLASE in the last 168 hours. No results for input(s): AMMONIA in the last 168  hours. Coagulation Profile: No results for input(s): INR, PROTIME in the last 168 hours. Cardiac Enzymes:  Recent Labs Lab 05/12/15 0525  TROPONINI 0.04*   BNP (last 3 results) No results for input(s): PROBNP in the last 8760 hours. HbA1C: No results for input(s): HGBA1C in the last 72 hours. CBG: No results for input(s): GLUCAP in the last 168 hours. Lipid Profile: No results for input(s): CHOL, HDL, LDLCALC, TRIG, CHOLHDL, LDLDIRECT in the last 72 hours. Thyroid Function Tests: No results for input(s): TSH, T4TOTAL, FREET4, T3FREE, THYROIDAB in the last 72 hours. Anemia Panel: No results for input(s): VITAMINB12, FOLATE, FERRITIN, TIBC, IRON, RETICCTPCT in the last 72 hours. Urine analysis:    Component Value Date/Time   COLORURINE YELLOW 01/02/2015 1710   APPEARANCEUR CLOUDY* 01/02/2015 1710   LABSPEC 1.013 01/02/2015 1710   PHURINE 5.0 01/02/2015 1710   GLUCOSEU NEGATIVE 01/02/2015 1710   HGBUR NEGATIVE 01/02/2015 1710   BILIRUBINUR NEGATIVE 01/02/2015 1710   KETONESUR NEGATIVE 01/02/2015 1710   PROTEINUR NEGATIVE 01/02/2015 1710   UROBILINOGEN 0.2 09/21/2014 1835   NITRITE NEGATIVE 01/02/2015 1710   LEUKOCYTESUR SMALL* 01/02/2015 1710   Sepsis Labs: '@LABRCNTIP'$ (procalcitonin:4,lacticidven:4) )No results found for this or any previous visit (from the past 240 hour(s)).   Radiological Exams on Admission: Dg Chest 2 View  05/12/2015  CLINICAL DATA:  Increasing shortness of breath and difficulty breathing. Recent pneumonia diagnosis. EXAM: CHEST  2 VIEW COMPARISON:  01/03/2015 FINDINGS: Shallow inspiration. Cardiac enlargement. Diffuse pulmonary vascular congestion. Bilateral perihilar infiltrates likely representing edema. Superimposed pneumonia not excluded. There is progression since previous study. Small bilateral pleural effusions. No pneumothorax. Calcification of the aorta. IMPRESSION: Cardiac enlargement with pulmonary vascular congestion. Perihilar edema and  bilateral pleural effusions. Electronically Signed   By: Lucienne Capers M.D.   On: 05/12/2015 05:00   Ct Chest Wo Contrast  05/12/2015  CLINICAL DATA:  Pneumonia.  Shortness of breath. EXAM: CT CHEST WITHOUT CONTRAST TECHNIQUE: Multidetector CT imaging of the chest was performed following the standard protocol without IV contrast. COMPARISON:  Plain films earlier today FINDINGS: Mediastinum/Nodes: Heart is borderline in size. Aorta is normal caliber. Scattered coronary artery calcifications. Aortic arch calcifications. Mildly enlarged mediastinal lymph nodes in the prevascular, right paratracheal, AP window, pretracheal and subcarinal areas. Index right paratracheal lymph node has a short axis diameter of 13 mm on image 31. No visible hilar adenopathy although evaluation of the hila is difficult without IV contrast. No axillary adenopathy.  Lungs/Pleura: Moderate to large bilateral pleural effusions. Diffuse airspace disease bilaterally, most pronounced in the upper lobes. Findings could reflect multifocal pneumonia or edema. Upper abdomen: Prior cholecystectomy. Gastrostomy tube noted within the stomach. No acute findings in the upper abdomen. Musculoskeletal: Chest wall soft tissues are unremarkable. No acute bony abnormality. IMPRESSION: Diffuse bilateral airspace disease, most pronounced in the upper lobes with large bilateral pleural effusions. Findings could reflect multifocal pneumonia or edema/failure. Moderate mediastinal adenopathy. While this could be reactive to the process in the lungs, recommend follow-up in 3-6 months after acute symptoms have resolved. Electronically Signed   By: Rolm Baptise M.D.   On: 05/12/2015 07:40    EKG: Independently reviewed.   Assessment/Plan Principal Problem:   Respiratory failure, acute (HCC) Active Problems:   Alzheimer's disease   Type 2 diabetes mellitus (HCC)   Acute on chronic diastolic congestive heart failure (HCC)   Essential hypertension    Weakness generalized   1.  Acute hypoxemic respiratory failure. Mr Standage transfer to the emergency department from his nursing facility presented in acute respiratory failure, having labored breathing, requiring a 100% nonrebreather. I suspect secondary to combination of acute on chronic diastolic congestive heart failure as well as development of healthcare associated pneumonia. CT scan of lungs performed in the emergency room today showing findings consistent with multifocal pneumonia along with pulmonary edema. Currently on nonrebreather, will provide BiPAP if respiratory status does not improve. He was given 80 mg of IV Lasix in the emergency department. Continue broad-spectrum IV antimicrobial therapy. Patient will require admission to the step down unit.  2.  Acute on chronic diastolic congestive heart failure. On initial evaluation he had labored breathing with extensive bilateral rales. Lab work showing a and P in 1557. He has a history of stage IV chronic kidney disease. He was given 80 mg of IV Lasix in the emergency room, will monitor input and output. We'll monitor volume status, continue Lasix at 40 mg IV twice a day.   3.  Acute on chronic kidney disease. Patient with history of stage IV chronic kidney disease. Presented with creatinine of 3.6 with BUN of 116. He has been seen by nephrology on previous hospitalization, not felt to be candidate for renal replacement therapy given multiple morbidities and poor functional status. He was administered IV Lasix in the emergency room will monitor output closely.  4.  Hyperkalemia. Lab work showing potassium of 3.6, he was given Late, IV insulin and apical D50 in the emergency department. Plan to repeat BMP later today. I suspect hyperkalemia secondary to renal failure.  5.  Insulin-dependent diabetes mellitus. He is on Lantus 20 units subcutaneous at bedtime. Will hold Lantus as plan to keep him nothing by mouth given critical illness. Provide  sliding scale coverage with NovoLog.  6.  Hypertension. Continue home regimen with hydralazine 50 mg 3 times daily per tube  7.  History of dysphasia. Will maintain him nothing by mouth. He had been on PEG tube feeds prior to this hospitalization.  8.  Goals of care. I called his brother Barrett Henle to update him on patient's condition. We talked about his overall poor prognosis. He would like to discuss CODE STATUS further with his sisters prior to making any decisions. Will consult palliative care to facilitate goals of care discussions.  DVT prophylaxis: Heparin Code Status: Full code Family Communication: I spoke to his brother [name] Disposition Plan: Anticipate he may require greater than 2 nights hospitalization Consults called:  Admission status:  Admit to step down unit   Kelvin Cellar MD [facility] Pager [phone]  If 7PM-7AM, please contact night-coverage [email or web address] Password [facility]  05/12/2015, 8:48 AM

## 2015-05-12 NOTE — ED Notes (Signed)
Dose and rate of calcium gluconate verified with Merrilee Seashore in pharmacy.

## 2015-05-12 NOTE — ED Notes (Signed)
Patient is from [facility] facility where he has been diagnosed with pneumonia and being treated with antibiotics for one day. Patient was given an extra 40mg  of lasix for the shortness of breath. Patient was sent out because he continued to be short of breath despite giving him the lasix.

## 2015-05-12 NOTE — Progress Notes (Signed)
Pharmacy Antibiotic Note  [name] is a 70 y.o. male admitted on 05/12/2015 from SNF with SOB, pneumonia.  CT shows multifocal pneumonia vs edema.  Note that he was NOT considered a candidate for HD per nephrology d/t dementia, illness, debility.  Pharmacy has been consulted for Vancomycin and Cefepime dosing. - Afebrile, WBC 8.5, SCr elevated at 3.6 with Crcl ~ 21 ml/min (19 ml/min N)  Plan: Cefepime 2g IV x1 then 1g IV q24h Vancomycin 1g IV x1 then 1g IV q48h. Measure Vanc trough at steady state. Follow up renal fxn, culture results, and clinical course.  Weight: 218 lb 11.1 oz (99.2 kg)  Temp (24hrs), Avg:97.7 F (36.5 C), Min:97.7 F (36.5 C), Max:97.7 F (36.5 C)   Recent Labs Lab 05/12/15 0525 05/12/15 0553  WBC 8.5  --   CREATININE 3.36* 3.60*    Estimated Creatinine Clearance: 21.4 mL/min (by C-G formula based on Cr of 3.6).    No Known Allergies  Antimicrobials this admission: 5/1 Vancomycin >>  5/1 Cefepime >>   Dose adjustments this admission:  Microbiology results: 5/1 BCx: sent 5/1 MRSA PCR: sent  Thank you for allowing pharmacy to be a part of this patient's care.  Gretta Arab PharmD, BCPS Pager [phone] 05/12/2015 10:06 AM

## 2015-05-12 NOTE — ED Notes (Signed)
Dr. Kathrynn Humble made aware of potassium, BUN, CR.

## 2015-05-12 NOTE — ED Provider Notes (Addendum)
CSN: [id number]     Arrival date & time 05/12/15  0403 History   First MD Initiated Contact with Patient 05/12/15 [phone]     Chief Complaint  Patient presents with  . Shortness of Breath     (Consider location/radiation/quality/duration/timing/severity/associated sxs/prior Treatment) HPI Comments: LEVEL 5 CAVEAT FOR DEMENTIA Pt with hx of DM2, htn, diastolic CHF stage 3 CKD comes in with [facility] with cc of dib. Contact # is Ms. Reed Doddridge called but there was no response. Spoke with nursing home, pt was diagnosed with pneumonia last week and started on augmentin. LAst night he got short of breath and had rales - so lasix given. Overtime, his RR went up and he had hypoxia - so EMS was called.  Pt is not providing any meaningful history.   Patient is a 70 y.o. male presenting with shortness of breath. The history is provided by the patient.  Shortness of Breath   Past Medical History  Diagnosis Date  . Hyponatremia   . Hypokalemia   . Anemia   . Diabetes mellitus without complication ([place])   . Hypertension   . UTI (lower urinary tract infection)   . Delusional disorder ([place])   . HOH (hard of hearing)   . Hyperlipidemia   . CHF (congestive heart failure) ([place])   . Renal insufficiency   . Stroke ([place])   . Dementia   . Depression   . Dysphagia   . Falls frequently   . Colon cancer ([place]) 2009    s/p Colectomy  . Alzheimer's dementia   . A-fib [facility])    Past Surgical History  Procedure Laterality Date  . Colon surgery    . Colectomy  09/06/2007  . Ileostomy    . Peg tube removal    . Cardiac catheterization    . Cholecystectomy    . Abdominal adhesion surgery     No family history on file. Social History  Substance Use Topics  . Smoking status: Never Smoker   . Smokeless tobacco: None  . Alcohol Use: No    Review of Systems  Unable to perform ROS: Dementia  Respiratory: Positive for shortness of breath.       Allergies  Review of patient's  allergies indicates no known allergies.  Home Medications   Prior to Admission medications   Medication Sig Start Date End Date Taking? Authorizing Provider  amLODipine (NORVASC) 10 MG tablet Place 10 mg into feeding tube daily. 04/23/15  Yes Historical Provider, MD  amoxicillin-clavulanate (AUGMENTIN) 875-125 MG tablet Give 1 tablet by tube 2 (two) times daily. 10 day therapy course patient to completed by 05/11/2015 05/02/15  Yes Historical Provider, MD  antiseptic oral rinse (BIOTENE) LIQD 15 mLs by Mouth Rinse route 2 (two) times daily.   Yes Historical Provider, MD  ascorbic acid (VITAMIN C) 1000 MG tablet Give 1,000 mg by tube 2 (two) times daily.   Yes Historical Provider, MD  aspirin 81 MG chewable tablet Give 81 mg by tube daily.   Yes Historical Provider, MD  cyanocobalamin (,VITAMIN B-12,) 1000 MCG/ML injection Inject 1,000 mcg into the muscle every 30 (thirty) days.   Yes Historical Provider, MD  feeding supplement (BOOST HIGH PROTEIN) LIQD Take 1 Container by mouth 3 (three) times daily with meals.   Yes Historical Provider, MD  ferrous sulfate 220 (44 FE) MG/5ML solution Place 330 mg into feeding tube daily.   Yes Historical Provider, MD  furosemide (LASIX) 40  MG tablet Place 1 tablet (40 mg total) into feeding tube 2 (two) times daily. Furosemide 40 BID for three days and then take 1 tablet daily starting on 01/04/15 Patient taking differently: Place 40 mg into feeding tube 2 (two) times daily.  01/07/15  Yes [name], MD  hydrALAZINE (APRESOLINE) 50 MG tablet Give 50 mg by tube 4 (four) times daily - after meals and at bedtime.    Yes Historical Provider, MD  insulin glargine (LANTUS) 100 UNIT/ML injection Inject 20 Units into the skin at bedtime.   Yes Historical Provider, MD  insulin lispro (HUMALOG) 100 UNIT/ML injection Inject 2-12 Units into the skin 4 (four) times daily. MAR has Insulin given 4 times daily with meals per patient 201-250=2units   251-300units=4units 301-350=6units  351-400=8units 401-450=10units >451=12 u   Yes Historical Provider, MD  Multiple Vitamin (MULTIVITAMIN) LIQD 5 mLs by PEG Tube route daily.    Yes Historical Provider, MD  Nutritional Supplements (FEEDING SUPPLEMENT, OSMOLITE 1.5 CAL,) LIQD Place 1,000 mLs into feeding tube continuous. 01/07/15  Yes [name], MD  pantoprazole sodium (PROTONIX) 40 mg/20 mL PACK Place 40 mg into feeding tube daily.   Yes Historical Provider, MD  sertraline (ZOLOFT) 100 MG tablet Place 150 mg into feeding tube daily.  04/01/15  Yes Historical Provider, MD  tamsulosin (FLOMAX) 0.4 MG CAPS capsule Take 0.4 mg by mouth.   Yes Historical Provider, MD  traZODone (DESYREL) 50 MG tablet 25 mg by Feeding Tube route at bedtime.    Yes Historical Provider, MD  Water For Irrigation, Sterile (FREE WATER) SOLN Place 300 mLs into feeding tube every 6 (six) hours. 01/07/15  Yes [name], MD   BP 153/84 mmHg  Pulse 51  Temp(Src) 97.7 F (36.5 C) (Oral)  Resp 20  SpO2 100% Physical Exam  Constitutional: He is oriented to person, place, and time. He appears well-developed.  HENT:  Head: Atraumatic.  Neck: Neck supple. JVD present.  Cardiovascular: Normal rate.   Pulmonary/Chest: He is in respiratory distress. He has rales.  Poor aeration  Abdominal: Soft. He exhibits no distension. There is no tenderness.  Musculoskeletal: He exhibits edema.  Neurological: He is alert and oriented to person, place, and time.  Skin: Skin is warm.  Nursing note and vitals reviewed.   ED Course  Procedures (including critical care time)  CRITICAL CARE Performed by: Varney Biles   Total critical care time: 45 minutes  Critical care time was exclusive of separately billable procedures and treating other patients.  Critical care was necessary to treat or prevent imminent or life-threatening deterioration.  Critical care was time spent personally by me on the following activities:  development of treatment plan with patient and/or surrogate as well as nursing, discussions with consultants, evaluation of patient's response to treatment, examination of patient, obtaining history from patient or surrogate, ordering and performing treatments and interventions, ordering and review of laboratory studies, ordering and review of radiographic studies, pulse oximetry and re-evaluation of patient's condition.   Labs Review Labs Reviewed  CBC WITH DIFFERENTIAL/PLATELET - Abnormal; Notable for the following:    RBC 3.44 (*)    Hemoglobin 9.1 (*)    HCT 28.2 (*)    RDW 18.2 (*)    Lymphs Abs 0.3 (*)    All other components within normal limits  BASIC METABOLIC PANEL - Abnormal; Notable for the following:    Potassium 6.6 (*)    CO2 16 (*)    Glucose, Bld 150 (*)  BUN 111 (*)    Creatinine, Ser 3.36 (*)    GFR calc non Af Amer 17 (*)    GFR calc Af Amer 20 (*)    All other components within normal limits  BRAIN NATRIURETIC PEPTIDE - Abnormal; Notable for the following:    B Natriuretic Peptide 1557.0 (*)    All other components within normal limits  TROPONIN I - Abnormal; Notable for the following:    Troponin I 0.04 (*)    All other components within normal limits  I-STAT CHEM 8, ED - Abnormal; Notable for the following:    Potassium 6.5 (*)    BUN 116 (*)    Creatinine, Ser 3.60 (*)    Glucose, Bld 143 (*)    Hemoglobin 10.9 (*)    HCT 32.0 (*)    All other components within normal limits  CULTURE, BLOOD (ROUTINE X 2)  CULTURE, BLOOD (ROUTINE X 2)    Imaging Review Dg Chest 2 View  05/12/2015  CLINICAL DATA:  Increasing shortness of breath and difficulty breathing. Recent pneumonia diagnosis. EXAM: CHEST  2 VIEW COMPARISON:  01/03/2015 FINDINGS: Shallow inspiration. Cardiac enlargement. Diffuse pulmonary vascular congestion. Bilateral perihilar infiltrates likely representing edema. Superimposed pneumonia not excluded. There is progression since previous study.  Small bilateral pleural effusions. No pneumothorax. Calcification of the aorta. IMPRESSION: Cardiac enlargement with pulmonary vascular congestion. Perihilar edema and bilateral pleural effusions. Electronically Signed   By: Lucienne Capers M.D.   On: 05/12/2015 05:00   Ct Chest Wo Contrast  05/12/2015  CLINICAL DATA:  Pneumonia.  Shortness of breath. EXAM: CT CHEST WITHOUT CONTRAST TECHNIQUE: Multidetector CT imaging of the chest was performed following the standard protocol without IV contrast. COMPARISON:  Plain films earlier today FINDINGS: Mediastinum/Nodes: Heart is borderline in size. Aorta is normal caliber. Scattered coronary artery calcifications. Aortic arch calcifications. Mildly enlarged mediastinal lymph nodes in the prevascular, right paratracheal, AP window, pretracheal and subcarinal areas. Index right paratracheal lymph node has a short axis diameter of 13 mm on image 31. No visible hilar adenopathy although evaluation of the hila is difficult without IV contrast. No axillary adenopathy. Lungs/Pleura: Moderate to large bilateral pleural effusions. Diffuse airspace disease bilaterally, most pronounced in the upper lobes. Findings could reflect multifocal pneumonia or edema. Upper abdomen: Prior cholecystectomy. Gastrostomy tube noted within the stomach. No acute findings in the upper abdomen. Musculoskeletal: Chest wall soft tissues are unremarkable. No acute bony abnormality. IMPRESSION: Diffuse bilateral airspace disease, most pronounced in the upper lobes with large bilateral pleural effusions. Findings could reflect multifocal pneumonia or edema/failure. Moderate mediastinal adenopathy. While this could be reactive to the process in the lungs, recommend follow-up in 3-6 months after acute symptoms have resolved. Electronically Signed   By: Rolm Baptise M.D.   On: 05/12/2015 07:40   I have personally reviewed and evaluated these images and lab results as part of my medical  decision-making.   EKG Interpretation   Date/Time:  Monday May 12 2015 04:13:00 EDT Ventricular Rate:  52 PR Interval:    QRS Duration: 118 QT Interval:  496 QTC Calculation: 461 R Axis:   -44 Text Interpretation:  Junctional rhythm Incomplete RBBB and LAFB Anterior  infarct, old new changes - junctional rhythm  Confirmed by Kathrynn Humble, MD,  Thelma Comp [phone]) on 05/12/2015 4:53:58 AM Also confirmed by Kathrynn Humble, MD, Cordarryl Monrreal  [phone]), editor [place]  CCT, [place] ([id number])  on 05/12/2015 6:55:58 AM      MDM   Final diagnoses:  Acute on  chronic renal failure (HCC)  Acute pulmonary edema (HCC)  Acute hyperkalemia    Pt comes in with cc of DIB. Appears volume overload. Apparently had a diagnosis of pneumonia at the nursing home. CXR shows congestion. ? PNA. Will get CT to get a better morphology. If he has PNA - might need vanc and cef or at least levaquin.  Pt has hyperK, which we gave him meds for in the ER. He has CKD. BUN also elevated. Pulm vascular congestion due to renal etiology.  "-Per Renal not a HD candidate due to dementia, chronic illnesses, debility -Palliative consulted for goals of care, had family meeting yesterday, remains Full Code at this time, but decision made for Discharge back to his Long term facility with hospice, will need ongoing discussions on goals at SNF."   WE WILL NOT [place]. GOALS OF CARE WILL NEED READDRESSING - PAGED PALLIATIVE WITH NO CALL BACK. PT IS FULL CODE. LASIX 80 MG IV GIVEN HERE.    Varney Biles, MD 05/12/15 [place], MD 05/12/15 [phone]

## 2015-05-12 NOTE — ED Notes (Signed)
Patient has a peg tube with placement checked at 0630. Patient has ileostomy to rlq.

## 2015-05-12 NOTE — ED Notes (Signed)
Bed: [id number] Expected date:  Expected time:  Means of arrival:  Comments: 30 M respiratory distress

## 2015-05-13 ENCOUNTER — Inpatient Hospital Stay (HOSPITAL_COMMUNITY): Payer: Medicare Other

## 2015-05-13 DIAGNOSIS — G301 Alzheimer's disease with late onset: Secondary | ICD-10-CM

## 2015-05-13 DIAGNOSIS — F0281 Dementia in other diseases classified elsewhere with behavioral disturbance: Secondary | ICD-10-CM

## 2015-05-13 LAB — COMPREHENSIVE METABOLIC PANEL
ALK PHOS: 80 U/L (ref 38–126)
ALT: 62 U/L (ref 17–63)
AST: 48 U/L — AB (ref 15–41)
Albumin: 3.1 g/dL — ABNORMAL LOW (ref 3.5–5.0)
Anion gap: 16 — ABNORMAL HIGH (ref 5–15)
BUN: 109 mg/dL — AB (ref 6–20)
CHLORIDE: 112 mmol/L — AB (ref 101–111)
CO2: 21 mmol/L — AB (ref 22–32)
CREATININE: 2.82 mg/dL — AB (ref 0.61–1.24)
Calcium: 9.2 mg/dL (ref 8.9–10.3)
GFR calc Af Amer: 25 mL/min — ABNORMAL LOW (ref >60)
GFR calc non Af Amer: 21 mL/min — ABNORMAL LOW (ref >60)
GLUCOSE: 79 mg/dL (ref 65–99)
Potassium: 3.1 mmol/L — ABNORMAL LOW (ref 3.5–5.1)
SODIUM: 149 mmol/L — AB (ref 135–145)
Total Bilirubin: 0.9 mg/dL (ref 0.3–1.2)
Total Protein: 7.4 g/dL (ref 6.5–8.1)

## 2015-05-13 LAB — GLUCOSE, CAPILLARY
GLUCOSE-CAPILLARY: 102 mg/dL — AB (ref 65–99)
GLUCOSE-CAPILLARY: 87 mg/dL (ref 65–99)
Glucose-Capillary: 168 mg/dL — ABNORMAL HIGH (ref 65–99)
Glucose-Capillary: 60 mg/dL — ABNORMAL LOW (ref 65–99)
Glucose-Capillary: 160 mg/dL — ABNORMAL HIGH (ref 65–99)
Glucose-Capillary: 66 mg/dL (ref 65–99)
Glucose-Capillary: 94 mg/dL (ref 65–99)
Glucose-Capillary: 96 mg/dL (ref 65–99)
Glucose-Capillary: 99 mg/dL (ref 65–99)

## 2015-05-13 LAB — CBC
HCT: 28.2 % — ABNORMAL LOW (ref 39.0–52.0)
Hemoglobin: 9.2 g/dL — ABNORMAL LOW (ref 13.0–17.0)
MCH: 26.3 pg (ref 26.0–34.0)
MCHC: 32.6 g/dL (ref 30.0–36.0)
MCV: 80.6 fL (ref 78.0–100.0)
PLATELETS: 331 10*3/uL (ref 150–400)
RBC: 3.5 MIL/uL — ABNORMAL LOW (ref 4.22–5.81)
RDW: 17.8 % — AB (ref 11.5–15.5)
WBC: 7.2 10*3/uL (ref 4.0–10.5)

## 2015-05-13 LAB — HIV ANTIBODY (ROUTINE TESTING W REFLEX): HIV SCREEN 4TH GENERATION: NONREACTIVE

## 2015-05-13 MED ORDER — PANTOPRAZOLE SODIUM 40 MG PO PACK
40.0000 mg | PACK | Freq: Every day | ORAL | Status: DC
  Administered 2015-05-13 – 2015-05-15 (×3): 40 mg
  Filled 2015-05-13 (×4): qty 20

## 2015-05-13 MED ORDER — PANTOPRAZOLE SODIUM 40 MG PO TBEC
40.0000 mg | DELAYED_RELEASE_TABLET | Freq: Every day | ORAL | Status: DC
  Filled 2015-05-13 (×2): qty 1

## 2015-05-13 MED ORDER — FUROSEMIDE 10 MG/ML IJ SOLN
60.0000 mg | Freq: Two times a day (BID) | INTRAMUSCULAR | Status: DC
  Administered 2015-05-13 – 2015-05-14 (×2): 60 mg via INTRAVENOUS
  Filled 2015-05-13 (×2): qty 6

## 2015-05-13 MED ORDER — POTASSIUM CHLORIDE 20 MEQ/15ML (10%) PO SOLN
40.0000 meq | Freq: Four times a day (QID) | ORAL | Status: AC
  Administered 2015-05-13 (×2): 40 meq
  Filled 2015-05-13 (×2): qty 30

## 2015-05-13 MED ORDER — DEXTROSE 50 % IV SOLN
50.0000 mL | Freq: Once | INTRAVENOUS | Status: AC
  Administered 2015-05-13: 50 mL via INTRAVENOUS
  Filled 2015-05-13: qty 50

## 2015-05-13 MED ORDER — POTASSIUM CHLORIDE CRYS ER 20 MEQ PO TBCR
40.0000 meq | EXTENDED_RELEASE_TABLET | Freq: Four times a day (QID) | ORAL | Status: DC
  Filled 2015-05-13 (×2): qty 2

## 2015-05-13 NOTE — Progress Notes (Signed)
PHARMACIST - PHYSICIAN COMMUNICATION CONCERNING: IV to Oral Route Change Policy  RECOMMENDATION: This patient is receiving Protonix by the intravenous route.  Based on criteria approved by the Pharmacy and Therapeutics Committee, the intravenous medication(s) is/are being converted to the equivalent oral dose form(s).   DESCRIPTION: These criteria include:  The patient is eating (either orally or via tube) and/or has been taking other orally administered medications for a least 24 hours  The patient has no evidence of active gastrointestinal bleeding or impaired GI absorption (gastrectomy, short bowel, patient on TNA or NPO).  If you have questions about this conversion, please contact the Pharmacy Department  []   [phone] )  [facility] []   [phone] )  [facility] []   [phone] )  [facility] []   [phone] )  [facility] [x]   [phone] )  [place], [facility] 05/13/2015 10:06 AM

## 2015-05-13 NOTE — Progress Notes (Signed)
Inpatient Diabetes Program Recommendations  AACE/ADA: New Consensus Statement on Inpatient Glycemic Control (2015)  Target Ranges:  Prepandial:   less than 140 mg/dL      Peak postprandial:   less than 180 mg/dL (1-2 hours)      Critically ill patients:  140 - 180 mg/dL   Results for [name], [name] (MRN [id number]) as of 05/13/2015 08:43  Ref. Range 05/12/2015 19:42 05/12/2015 20:18 05/12/2015 23:14 05/13/2015 00:02 05/13/2015 04:21 05/13/2015 04:51 05/13/2015 07:12  Glucose-Capillary Latest Ref Range: 65-99 mg/dL 52 (L) 105 (H) 60 (L) 168 (H) 66 160 (H) 94    Admit with: SOB  History: DM, Dementia, CKD  SNF DM Meds: Lantus 20 units daily    Humalog 2-12 units QID per SSI  Current Insulin Orders: Novolog Moderate Correction Scale/ SSI (0-15 units) Q4 hours      MD- Note patient had multiple Hypoglycemic events that started at 8pm last night.  Please consider reducing Novolog Correction Scale/ SSI to Sensitive scale (0-9 units) Q4 hours if patient continues to have Hypoglycemia     --Will follow patient during hospitalization--  Wyn Quaker RN, MSN, CDE Diabetes Coordinator Inpatient Glycemic Control Team Team Pager: [phone] (8a-5p)

## 2015-05-13 NOTE — Progress Notes (Signed)
PROGRESS NOTE  [name]  MRN:[id number] DOB: 11/20/1945 DOA: 05/12/2015 PCP: HUSAIN,KARRAR, MD Outpatient Specialists:  Subjective: Hard of hearing, was not able to come indicate with him efficiently. Reportedly patient also demented  Brief Narrative:  [name] is a 70 y.o. male with medical history significant of advanced dementia, type 2 diabetes mellitus, stage IV chronic kidney disease, I Stella congestive heart failure, poor functional status at baseline, resident at [facility], last hospitalized from 01/02/2015 through 01/07/2015 for acute on chronic renal failure and acute on chronic diastolic congestive heart failure. During that hospitalization he was seen by nephrology, now found to be candidate for hemodialysis. Palliative care met with family members at the time, family wishing to keep him full code. He presents to the emergency room today in acute respiratory failure. At his facility he was diagnosed with pneumonia and started on antibiotics. Overnight he had significant clinical deterioration, going into respiratory distress, hypoxemic. EMS called. History was obtained from family members over telephone conversation as well as from emergency room staff. Patient unable to provide history.   Assessment & Plan:   Principal Problem:   Respiratory failure, acute (HCC) Active Problems:   Alzheimer's disease   Type 2 diabetes mellitus (HCC)   Acute on chronic diastolic congestive heart failure (HCC)   Essential hypertension   Weakness generalized   Acute hypoxemic respiratory failure Presented with shortness of breath and required nonrebreather mask while he was in the emergency department. Acute respiratory failure likely secondary to acute on chronic diastolic CHF and suspicion of such. This is improved earlier today he was on 3 L of oxygen will try to wean off of oxygen. Continue treatment for CHF and pneumonia.  Acute on chronic diastolic congestive heart  failure On initial evaluation he had labored breathing with extensive bilateral rales.  Lab work showing BNP of 1557. He has a history of stage IV chronic kidney disease.  Continue Lasix at 60 mg IV twice a day. Fluids status improved since yesterday.  Acute on chronic kidney disease Patient with history of stage IV chronic kidney disease. Presented with creatinine of 3.6 with BUN of 116.  Baseline creatinine is 2.5-3.5 for the past 6 month. Creatinine improved after diuresis probably with improvement of the effective circulation continue diuresis. Check BMP in a.m.  Hyperkalemia/hypokalemia Presented with potassium of 6.6, this is resolved after diuresis is now with hypokalemia. Replete with oral supplements.  Insulin-dependent diabetes mellitus He is on Lantus 20 units subcutaneous at bedtime.  Lantus held initially, will restart as his diet restarted this morning.  Hypertension. Continue home regimen with hydralazine 50 mg 3 times daily per tube  History of dysphasia Nursing staff contacted the nursing home, he is on soft diet at daytime and to feeding at night.  Goals of care His brother Jerry Allred called by Dr. Zamora, was updated about his overall poor prognosis.  He would like to discuss CODE STATUS further with his sisters prior to making any decisions.  Palliative consulted   DVT prophylaxis:  Code Status: Full Code Family Communication:  Disposition Plan:  Diet: DIET SOFT Room service appropriate?: Yes; Fluid consistency:: Thin  Consultants:   None  Procedures:   None  Antimicrobials:   Maxipime and Vanco  Objective: Filed Vitals:   05/13/15 0800 05/13/15 0813 05/13/15 0900 05/13/15 1005  BP: 134/83 134/83  137/65  Pulse: 116  123 84  Temp: 97.4 F (36.3 C)     TempSrc: Axillary       Resp: _0 Height:      Weight:      SpO2: 99%  100% 98%    Intake/Output Summary (Last 24 hours) at 05/13/15 1238 Last data filed at 05/13/15 0900  Gross  per 24 hour  Intake   1250 ml  Output   4890 ml  Net  -3640 ml   Filed Weights   05/12/15 0942 05/13/15 0500  Weight: 99.2 kg (218 lb 11.1 oz) 93.8 kg (206 lb 12.7 oz)    Examination: General exam:Very hard of hearing Respiratory system: Clear to auscultation. Respiratory effort normal. Cardiovascular system: S1 & S2 heard, RRR. No JVD, murmurs, rubs, gallops or clicks. No pedal edema. Gastrointestinal system: Abdomen is nondistended, soft and nontender. No organomegaly or masses felt. Normal bowel sounds heard. Central nervous system: Alert and oriented. No focal neurological deficits. Extremities: Symmetric 5 x 5 power. Skin: No rashes, lesions or ulcers Psychiatry: Judgement and insight appear normal. Mood & affect appropriate.   Data Reviewed: I have personally reviewed following labs and imaging studies  CBC:  Recent Labs Lab 05/12/15 0525 05/12/15 0553 05/13/15 0303  WBC 8.5  --  7.2  NEUTROABS 7.2  --   --   HGB 9.1* 10.9* 9.2*  HCT 28.2* 32.0* 28.2*  MCV 82.0  --  80.6  PLT 306  --  774   Basic Metabolic Panel:  Recent Labs Lab 05/12/15 0525 05/12/15 0553 05/12/15 1150 05/13/15 0303  NA 139 137 142 149*  K 6.6* 6.5* 4.3 3.1*  CL 109 110 107 112*  CO2 16*  --  18* 21*  GLUCOSE 150* 143* 139* 79  BUN 111* 116* 113* 109*  CREATININE 3.36* 3.60* 3.35* 2.82*  CALCIUM 9.2  --  9.7 9.2   GFR: Estimated Creatinine Clearance: 26.6 mL/min (by C-G formula based on Cr of 2.82). Liver Function Tests:  Recent Labs Lab 05/13/15 0303  AST 48*  ALT 62  ALKPHOS 80  BILITOT 0.9  PROT 7.4  ALBUMIN 3.1*   No results for input(s): LIPASE, AMYLASE in the last 168 hours. No results for input(s): AMMONIA in the last 168 hours. Coagulation Profile: No results for input(s): INR, PROTIME in the last 168 hours. Cardiac Enzymes:  Recent Labs Lab 05/12/15 0525  TROPONINI 0.04*   BNP (last 3 results) No results for input(s): PROBNP in the last 8760  hours. HbA1C: No results for input(s): HGBA1C in the last 72 hours. CBG:  Recent Labs Lab 05/13/15 0002 05/13/15 0421 05/13/15 0451 05/13/15 0712 05/13/15 1206  GLUCAP 168* 66 160* 94 102*   Lipid Profile: No results for input(s): CHOL, HDL, LDLCALC, TRIG, CHOLHDL, LDLDIRECT in the last 72 hours. Thyroid Function Tests: No results for input(s): TSH, T4TOTAL, FREET4, T3FREE, THYROIDAB in the last 72 hours. Anemia Panel: No results for input(s): VITAMINB12, FOLATE, FERRITIN, TIBC, IRON, RETICCTPCT in the last 72 hours. Urine analysis:    Component Value Date/Time   COLORURINE YELLOW 01/02/2015 1710   APPEARANCEUR CLOUDY* 01/02/2015 1710   LABSPEC 1.013 01/02/2015 1710   PHURINE 5.0 01/02/2015 1710   GLUCOSEU NEGATIVE 01/02/2015 1710   HGBUR NEGATIVE 01/02/2015 1710   BILIRUBINUR NEGATIVE 01/02/2015 1710   KETONESUR NEGATIVE 01/02/2015 1710   PROTEINUR NEGATIVE 01/02/2015 1710   UROBILINOGEN 0.2 09/21/2014 1835   NITRITE NEGATIVE 01/02/2015 1710   LEUKOCYTESUR SMALL* 01/02/2015 1710   Sepsis Labs: _1 (procalcitonin:4,lacticidven:4)  ) Recent Results (from the past 240 hour(s))  Blood culture (routine x 2)  Status: None (Preliminary result)   Collection Time: 05/12/15  5:25 AM  Result Value Ref Range Status   Specimen Description BLOOD RIGHT ANTECUBITAL  Final   Special Requests BOTTLES DRAWN AEROBIC AND ANAEROBIC 5CC  Final   Culture   Final    NO GROWTH 1 DAY Performed at [facility]    Report Status PENDING  Incomplete  Blood culture (routine x 2)     Status: None (Preliminary result)   Collection Time: 05/12/15  8:20 AM  Result Value Ref Range Status   Specimen Description BLOOD RIGHT THUMB  Final   Special Requests IN PEDIATRIC BOTTLE 2ML  Final   Culture   Final    NO GROWTH < 24 HOURS Performed at [facility]    Report Status PENDING  Incomplete  MRSA PCR Screening     Status: Abnormal   Collection Time: 05/12/15  9:49 AM   Result Value Ref Range Status   MRSA by PCR POSITIVE (A) NEGATIVE Final    Comment:        The GeneXpert MRSA Assay (FDA approved for NASAL specimens only), is one component of a comprehensive MRSA colonization surveillance program. It is not intended to diagnose MRSA infection nor to guide or monitor treatment for MRSA infections. RESULT CALLED TO, READ BACK BY AND VERIFIED WITH: AFAPSAWO,A @ 1144 ON [id number] BY POTEAT,S      Invalid input(s): PROCALCITONIN, LACTICACIDVEN   Radiology Studies: Dg Chest 2 View  05/12/2015  CLINICAL DATA:  Increasing shortness of breath and difficulty breathing. Recent pneumonia diagnosis. EXAM: CHEST  2 VIEW COMPARISON:  01/03/2015 FINDINGS: Shallow inspiration. Cardiac enlargement. Diffuse pulmonary vascular congestion. Bilateral perihilar infiltrates likely representing edema. Superimposed pneumonia not excluded. There is progression since previous study. Small bilateral pleural effusions. No pneumothorax. Calcification of the aorta. IMPRESSION: Cardiac enlargement with pulmonary vascular congestion. Perihilar edema and bilateral pleural effusions. Electronically Signed   By: Lucienne Capers M.D.   On: 05/12/2015 05:00   Ct Chest Wo Contrast  05/12/2015  CLINICAL DATA:  Pneumonia.  Shortness of breath. EXAM: CT CHEST WITHOUT CONTRAST TECHNIQUE: Multidetector CT imaging of the chest was performed following the standard protocol without IV contrast. COMPARISON:  Plain films earlier today FINDINGS: Mediastinum/Nodes: Heart is borderline in size. Aorta is normal caliber. Scattered coronary artery calcifications. Aortic arch calcifications. Mildly enlarged mediastinal lymph nodes in the prevascular, right paratracheal, AP window, pretracheal and subcarinal areas. Index right paratracheal lymph node has a short axis diameter of 13 mm on image 31. No visible hilar adenopathy although evaluation of the hila is difficult without IV contrast. No axillary adenopathy.  Lungs/Pleura: Moderate to large bilateral pleural effusions. Diffuse airspace disease bilaterally, most pronounced in the upper lobes. Findings could reflect multifocal pneumonia or edema. Upper abdomen: Prior cholecystectomy. Gastrostomy tube noted within the stomach. No acute findings in the upper abdomen. Musculoskeletal: Chest wall soft tissues are unremarkable. No acute bony abnormality. IMPRESSION: Diffuse bilateral airspace disease, most pronounced in the upper lobes with large bilateral pleural effusions. Findings could reflect multifocal pneumonia or edema/failure. Moderate mediastinal adenopathy. While this could be reactive to the process in the lungs, recommend follow-up in 3-6 months after acute symptoms have resolved. Electronically Signed   By: Rolm Baptise M.D.   On: 05/12/2015 07:40   Portable Chest 1 View  05/13/2015  CLINICAL DATA:  Respiratory failure. EXAM: PORTABLE CHEST 1 VIEW COMPARISON:  CT 05/12/2015.  Chest x-ray 05/12/2015 FINDINGS: Stable cardiomegaly. Diffuse bilateral airspace disease again  noted. Small right pleural effusion, improved from prior exams. No pneumothorax. IMPRESSION: 1. Diffuse bilateral airspace disease again noted. This could represent diffuse pneumonia and/or pulmonary edema. No significant interim change. Small right pleural effusion, improved from prior exams. 2.  Stable cardiomegaly. Electronically Signed   By: Thomas  Register   On: 05/13/2015 07:12        Scheduled Meds: . antiseptic oral rinse  7 mL Mouth Rinse q12n4p  . ceFEPime (MAXIPIME) IV  1 g Intravenous Q24H  . chlorhexidine  15 mL Mouth Rinse BID  . Chlorhexidine Gluconate Cloth  6 each Topical Q0600  . free water  300 mL Per Tube Q6H  . furosemide  40 mg Intravenous BID  . heparin  5,000 Units Subcutaneous Q8H  . hydrALAZINE  50 mg Per Tube TID PC & HS  . insulin aspart  0-15 Units Subcutaneous Q4H  . mupirocin ointment  1 application Nasal BID  . pantoprazole sodium  40 mg Per Tube  Daily  . potassium chloride  40 mEq Per Tube Q6H  . sodium chloride flush  3 mL Intravenous Q12H  . sodium chloride flush  3 mL Intravenous Q12H  . [START ON 05/14/2015] vancomycin  1,000 mg Intravenous Q48H   Continuous Infusions:    LOS: 1 day    Time spent: 35 minutes    , A, MD [facility] Pager [phone]  If 7PM-7AM, please contact night-coverage [email or web address] Password [email or web address] 05/13/2015, 12:38 PM      

## 2015-05-13 NOTE — NC FL2 (Signed)
[place] MEDICAID FL2 LEVEL OF CARE SCREENING TOOL     IDENTIFICATION  Patient Name: [name] Birthdate: 1945-08-17 Sex: male Admission Date (Current Location): 05/12/2015  [facility] and [place] Number:  Herbalist and Address:  [facility]  [place] [address] [place]      Provider Number: [id number]  Attending Physician Name and Address:  Verlee Monte, MD  Relative Name and Phone Number:       Current Level of Care: Hospital Recommended Level of Care: [place] Prior Approval Number:    Date Approved/Denied:   PASRR Number: [id number]  Discharge Plan: SNF    Current Diagnoses: Patient Active Problem List   Diagnosis Date Noted  . Respiratory failure, acute ([place]) 05/12/2015  . Respiratory failure ([place]) 05/12/2015  . Weakness generalized   . DNR (do not resuscitate) discussion 01/06/2015  . Acute kidney injury ([place]) 01/02/2015  . ARF (acute renal failure) ([place]) 01/02/2015  . Bradycardia 10/31/2014  . Hyponatremia 10/31/2014  . Acute on chronic diastolic congestive heart failure ([place]) 09/20/2014  . Elevated troponin 09/20/2014  . Essential hypertension 09/20/2014  . Acute renal failure superimposed on stage 3 chronic kidney disease ([place])   . Hyperkalemia   . Congestive heart disease ([place])   . Diabetes ([place]) 11/03/2013  . Hyperlipidemia 11/02/2013  . Edema 08/09/2013  . Hypopotassemia 06/28/2013  . Chronic kidney disease 08/14/2012  . Other dysphagia 07/12/2012  . Palliative care encounter 07/12/2012  . Acute renal failure ([place]) 07/03/2012  . UTI (lower urinary tract infection) 07/03/2012  . Cellulitis and abscess of leg 07/03/2012  . Anemia in chronic renal disease 06/01/2012  . Type 2 diabetes mellitus ([place]) 06/01/2012  . Benign renovascular hypertension 06/01/2012  . FISTULA, INTESTINE 02/23/2010  . ADENOCARCINOMA, COLON, HX OF 04/23/2009  . DM 11/18/2008  . Alzheimer's disease 11/18/2008  .  NONSPECIFIC ABN FINDING RAD & OTH EXAM GI TRACT 11/18/2008    Orientation RESPIRATION BLADDER Height & Weight     Self  O2 (4L) Incontinent Weight: 206 lb 12.7 oz (93.8 kg) Height:  5\' 7"  (170.2 cm)  BEHAVIORAL SYMPTOMS/MOOD NEUROLOGICAL BOWEL NUTRITION STATUS      Colostomy Diet (Soft Diet)  AMBULATORY STATUS COMMUNICATION OF NEEDS Skin   Extensive Assist Verbally Surgical wounds (Incision(Closed)06/24/14AbdomenLower;Left)                       Personal Care Assistance Level of Assistance  Bathing, Feeding, Dressing Bathing Assistance: Maximum assistance Feeding assistance: Maximum assistance Dressing Assistance: Maximum assistance     Functional Limitations Info             SPECIAL CARE FACTORS FREQUENCY                       Contractures      Additional Factors Info  Code Status, Allergies Code Status Info: Fullcode Allergies Info: NKDA           Current Medications (05/13/2015):  This is the current hospital active medication list Current Facility-Administered Medications  Medication Dose Route Frequency Provider Last Rate Last Dose  . 0.9 %  sodium chloride infusion  250 mL Intravenous PRN Kelvin Cellar, MD      . antiseptic oral rinse (CPC / CETYLPYRIDINIUM CHLORIDE 0.05%) solution 7 mL  7 mL Mouth Rinse q12n4p Kelvin Cellar, MD   7 mL at 05/13/15 1215  . ceFEPIme (MAXIPIME) 1 g in dextrose 5 % 50 mL  IVPB  1 g Intravenous Q24H Randa Spike, RPH   1 g at 05/13/15 0814  . chlorhexidine (PERIDEX) 0.12 % solution 15 mL  15 mL Mouth Rinse BID Kelvin Cellar, MD   15 mL at 05/13/15 1000  . Chlorhexidine Gluconate Cloth 2 % PADS 6 each  6 each Topical Q0600 Kelvin Cellar, MD   6 each at 05/13/15 1010  . free water 300 mL  300 mL Per Tube Q6H Kelvin Cellar, MD   300 mL at 05/13/15 1200  . furosemide (LASIX) injection 60 mg  60 mg Intravenous BID Verlee Monte, MD      . heparin injection 5,000 Units  5,000 Units Subcutaneous Q8H Kelvin Cellar, MD   5,000 Units at 05/13/15 [id number]  . hydrALAZINE (APRESOLINE) tablet 50 mg  50 mg Per Tube TID PC & HS Kelvin Cellar, MD   50 mg at 05/13/15 0813  . insulin aspart (novoLOG) injection 0-15 Units  0-15 Units Subcutaneous Q4H Kelvin Cellar, MD   2 Units at 05/12/15 1536  . morphine 2 MG/ML injection 2 mg  2 mg Intravenous Q4H PRN Kelvin Cellar, MD      . mupirocin ointment (BACTROBAN) 2 % 1 application  1 application Nasal BID Kelvin Cellar, MD   1 application at [id number]  . ondansetron (ZOFRAN) tablet 4 mg  4 mg Oral Q6H PRN Kelvin Cellar, MD       Or  . ondansetron (ZOFRAN) injection 4 mg  4 mg Intravenous Q6H PRN Kelvin Cellar, MD      . pantoprazole sodium (PROTONIX) 40 mg/20 mL oral suspension 40 mg  40 mg Per Tube Daily Dara Hoyer, RPH      . potassium chloride 20 MEQ/15ML (10%) solution 40 mEq  40 mEq Per Tube Q6H Verlee Monte, MD   40 mEq at 05/13/15 1210  . sodium chloride flush (NS) 0.9 % injection 3 mL  3 mL Intravenous Q12H Kelvin Cellar, MD   3 mL at 05/12/15 1000  . sodium chloride flush (NS) 0.9 % injection 3 mL  3 mL Intravenous Q12H Kelvin Cellar, MD   3 mL at 05/13/15 1009  . sodium chloride flush (NS) 0.9 % injection 3 mL  3 mL Intravenous PRN Kelvin Cellar, MD      . Derrill Memo ON 05/14/2015] vancomycin (VANCOCIN) IVPB 1000 mg/200 mL premix  1,000 mg Intravenous Q48H Randa Spike, [place]         Discharge Medications: Please see discharge summary for a list of discharge medications.  Relevant Imaging Results:  Relevant Lab Results:   Additional Information SSN: [id number]  Standley Brooking, LCSW

## 2015-05-13 NOTE — Clinical Social Work Note (Signed)
Clinical Social Work Assessment  Patient Details  Name: [name] MRN: [id number] Date of Birth: [birth year]  Date of referral:  05/13/15               Reason for consult:  Facility Placement                Permission sought to share information with:  Facility Art therapist granted to share information::  Yes, Verbal Permission Granted  Name::        Agency::     Relationship::     Contact Information:     Housing/Transportation Living arrangements for the past 2 months:  [place] of Information:  Other (Comment Required) (brother, [name] via phone) Patient Interpreter Needed:  None Criminal Activity/Legal Involvement Pertinent to Current Situation/Hospitalization:  No - Comment as needed Significant Relationships:  Siblings Lives with:  Facility Resident Do you feel safe going back to the place where you live?  Yes Need for family participation in patient care:  Yes (Comment)  Care giving concerns:  CSW received consult that patient was admitted from [facility].    Social Worker assessment / plan:  CSW confirmed with patient's brother, [name] (ph#: [phone]) that patient was admitted from [place] to return to [facility] when discharged from the hospital.   Employment status:  Retired Forensic scientist:  Medicare, Medicaid In [place] PT Recommendations:  Not assessed at this time [place] / Referral to community resources:  [place]  Patient/Family's Response to care:  Patient's brother states that overall they have been pleased with [facility].   Patient/Family's Understanding of and Emotional Response to Diagnosis, Current Treatment, and Prognosis:    Emotional Assessment Appearance:  Appears stated age Attitude/Demeanor/Rapport:    Affect (typically observed):    Orientation:  Oriented to Self Alcohol / Substance use:    Psych involvement (Current and /or in the community):      Discharge Needs  Concerns to be addressed:    Readmission within the last 30 days:    Current discharge risk:    Barriers to Discharge:      Standley Brooking, LCSW 05/13/2015, 1:43 PM

## 2015-05-14 LAB — BASIC METABOLIC PANEL
Anion gap: 13 (ref 5–15)
BUN: 75 mg/dL — ABNORMAL HIGH (ref 6–20)
CHLORIDE: 106 mmol/L (ref 101–111)
CO2: 25 mmol/L (ref 22–32)
CREATININE: 2.24 mg/dL — AB (ref 0.61–1.24)
Calcium: 8.7 mg/dL — ABNORMAL LOW (ref 8.9–10.3)
GFR calc non Af Amer: 28 mL/min — ABNORMAL LOW (ref >60)
GFR, EST AFRICAN AMERICAN: 32 mL/min — AB (ref >60)
Glucose, Bld: 52 mg/dL — ABNORMAL LOW (ref 65–99)
Potassium: 2.8 mmol/L — ABNORMAL LOW (ref 3.5–5.1)
Sodium: 144 mmol/L (ref 135–145)

## 2015-05-14 LAB — GLUCOSE, CAPILLARY
GLUCOSE-CAPILLARY: 102 mg/dL — AB (ref 65–99)
GLUCOSE-CAPILLARY: 118 mg/dL — AB (ref 65–99)
GLUCOSE-CAPILLARY: 53 mg/dL — AB (ref 65–99)
GLUCOSE-CAPILLARY: 72 mg/dL (ref 65–99)
GLUCOSE-CAPILLARY: 85 mg/dL (ref 65–99)
Glucose-Capillary: 331 mg/dL — ABNORMAL HIGH (ref 65–99)
Glucose-Capillary: 48 mg/dL — ABNORMAL LOW (ref 65–99)

## 2015-05-14 MED ORDER — FUROSEMIDE 40 MG PO TABS
40.0000 mg | ORAL_TABLET | Freq: Two times a day (BID) | ORAL | Status: DC
  Administered 2015-05-14 – 2015-05-15 (×2): 40 mg via ORAL
  Filled 2015-05-14 (×2): qty 1

## 2015-05-14 MED ORDER — INSULIN GLARGINE 100 UNIT/ML ~~LOC~~ SOLN
5.0000 [IU] | Freq: Every day | SUBCUTANEOUS | Status: DC
  Administered 2015-05-14: 5 [IU] via SUBCUTANEOUS
  Filled 2015-05-14 (×2): qty 0.05

## 2015-05-14 MED ORDER — POTASSIUM CHLORIDE 20 MEQ/15ML (10%) PO SOLN
60.0000 meq | ORAL | Status: AC
  Administered 2015-05-14 (×2): 60 meq via ORAL
  Filled 2015-05-14 (×2): qty 45

## 2015-05-14 MED ORDER — POTASSIUM CHLORIDE 10 MEQ/100ML IV SOLN
10.0000 meq | INTRAVENOUS | Status: AC
  Administered 2015-05-14 – 2015-05-15 (×4): 10 meq via INTRAVENOUS
  Filled 2015-05-14 (×4): qty 100

## 2015-05-14 MED ORDER — POTASSIUM CHLORIDE CRYS ER 20 MEQ PO TBCR: 60.0000 meq | EXTENDED_RELEASE_TABLET | ORAL | Status: DC

## 2015-05-14 NOTE — Care Management Note (Signed)
Case Management Note  Patient Details  Name: [name] MRN: [id number] Date of Birth: 14-Dec-1945  Subjective/Objective:  70 y/o m admitted /wResp failure. From SNF-[facility]-CSW already following.                  Action/Plan:d/c plan back to SNF.   Expected Discharge Date:   (UNKNOWN)               Expected Discharge Plan:  Skilled Nursing Facility  In-House Referral:  Clinical Social Work  Discharge planning Services  CM Consult  Post Acute Care Choice:    Choice offered to:     DME Arranged:    DME Agency:     HH Arranged:    [place] Agency:     Status of Service:  In process, will continue to follow  Medicare Important Message Given:    Date Medicare IM Given:    Medicare IM give by:    Date Additional Medicare IM Given:    Additional Medicare Important Message give by:     If discussed at [place] of Stay Meetings, dates discussed:    Additional Comments:  Dessa Phi, RN 05/14/2015, 12:15 PM

## 2015-05-14 NOTE — Progress Notes (Signed)
PROGRESS NOTE  [name]  UUV:[id number] DOB: Jun 26, 1945 DOA: 05/12/2015 PCP: Wenda Low, MD Outpatient Specialists:  Subjective: No complaints, no events overnight per Staff  Brief Narrative:  [name] is a 70 y.o. male with medical history significant of advanced dementia, type 2 diabetes mellitus, stage IV chronic kidney disease, diastolic congestive heart failure, poor functional status at baseline, resident at [facility] : [facility], last hospitalized from 01/02/2015 through 01/07/2015 for acute on chronic renal failure and acute on chronic diastolic congestive heart failure. During that hospitalization he was seen by nephrology, now found to be candidate for hemodialysis. Palliative care met with family members at the time, family wishing to keep him full code. He presents to the emergency room today in acute respiratory failure. At his facility he was diagnosed with pneumonia and started on antibiotics. Overnight he had significant clinical deterioration, going into respiratory distress, hypoxemic.  Assessment & Plan:  Acute hypoxemic respiratory failure -Presented with shortness of breath and required nonrebreather mask while he was in the emergency department. Acute respiratory failure likely secondary to acute on chronic diastolic CHF  -improving with diuresis, negative 7.6L, change to PO lasix -wean O2  Acute on chronic diastolic congestive heart failure On initial evaluation he had labored breathing with extensive bilateral rales.  Lab work showing BNP of 1557. He has a history of stage IV chronic kidney disease.  -improving, change to PO Lasix  Acute on chronic kidney disease Patient with history of stage IV chronic kidney disease. Presented with creatinine of 3.6 with BUN of 116.  Baseline creatinine is 2.5-3.5 for the past 6 month. Creatinine improved after diuresis  -seen by Renal in past, not an HD candidate  Hyperkalemia/hypokalemia Presented with potassium of  6.6, this is resolved after diuresis is now with hypokalemia. Replete with oral supplements.  Insulin-dependent diabetes mellitus He is on Lantus 20 units subcutaneous at bedtime.  Lantus held initially, will restart as his diet restarted this morning.  Hypertension. Continue home regimen with hydralazine 50 mg 3 times daily per tube  History of dysphagia Nursing staff contacted the nursing home, he is on soft diet at daytime and to feeding at night.  Goals of care Overall poor prognosis, s/p multiple family meetings in the past, would benefit from Palliative care/SNF at Facility  DVT prophylaxis: lovenox  Code Status: Full Code Family Communication: no family at bedside, unable to reach family  Disposition Plan: SNF tomorrow if stable  Consultants: Palliative medicine  None  Procedures:   None  Antimicrobials:   Maxipime and Vanco  Objective: Filed Vitals:   05/13/15 1835 05/13/15 2031 05/14/15 0548 05/14/15 0830  BP: 139/63 131/64 127/59   Pulse:  78 66   Temp:  98.5 F (36.9 C) 98.4 F (36.9 C)   TempSrc:  Oral Oral   Resp:  18 18   Height:      Weight:   93.668 kg (206 lb 8 oz)   SpO2:  100% 100% 98%    Intake/Output Summary (Last 24 hours) at 05/14/15 1301 Last data filed at 05/14/15 1000  Gross per 24 hour  Intake   1430 ml  Output   3275 ml  Net  -1845 ml   Filed Weights   05/12/15 0942 05/13/15 0500 05/14/15 0548  Weight: 99.2 kg (218 lb 11.1 oz) 93.8 kg (206 lb 12.7 oz) 93.668 kg (206 lb 8 oz)    Examination: General exam:Very hard of hearing, alert, awake, no distress Respiratory system: Clear to auscultation. Respiratory  effort normal. Cardiovascular system: S1 & S2 heard, RRR. No JVD, murmurs, rubs, gallops or clicks. No pedal edema. Gastrointestinal system: Abdomen is nondistended, soft and nontender. No organomegaly or masses felt. Normal bowel sounds heard. Central nervous system: Alert and oriented. No focal neurological  deficits. Extremities: Symmetric 5 x 5 power. Skin: No rashes, lesions or ulcers Psychiatry: Judgement and insight appear normal. Mood & affect appropriate.   Data Reviewed: I have personally reviewed following labs and imaging studies  CBC:  Recent Labs Lab 05/12/15 0525 05/12/15 0553 05/13/15 0303  WBC 8.5  --  7.2  NEUTROABS 7.2  --   --   HGB 9.1* 10.9* 9.2*  HCT 28.2* 32.0* 28.2*  MCV 82.0  --  80.6  PLT 306  --  962   Basic Metabolic Panel:  Recent Labs Lab 05/12/15 0525 05/12/15 0553 05/12/15 1150 05/13/15 0303  NA 139 137 142 149*  K 6.6* 6.5* 4.3 3.1*  CL 109 110 107 112*  CO2 16*  --  18* 21*  GLUCOSE 150* 143* 139* 79  BUN 111* 116* 113* 109*  CREATININE 3.36* 3.60* 3.35* 2.82*  CALCIUM 9.2  --  9.7 9.2   GFR: Estimated Creatinine Clearance: 26.6 mL/min (by C-G formula based on Cr of 2.82). Liver Function Tests:  Recent Labs Lab 05/13/15 0303  AST 48*  ALT 62  ALKPHOS 80  BILITOT 0.9  PROT 7.4  ALBUMIN 3.1*   No results for input(s): LIPASE, AMYLASE in the last 168 hours. No results for input(s): AMMONIA in the last 168 hours. Coagulation Profile: No results for input(s): INR, PROTIME in the last 168 hours. Cardiac Enzymes:  Recent Labs Lab 05/12/15 0525  TROPONINI 0.04*   BNP (last 3 results) No results for input(s): PROBNP in the last 8760 hours. HbA1C: No results for input(s): HGBA1C in the last 72 hours. CBG:  Recent Labs Lab 05/13/15 2028 05/13/15 2335 05/14/15 0527 05/14/15 0734 05/14/15 1153  GLUCAP 99 87 102* 118* 331*   Lipid Profile: No results for input(s): CHOL, HDL, LDLCALC, TRIG, CHOLHDL, LDLDIRECT in the last 72 hours. Thyroid Function Tests: No results for input(s): TSH, T4TOTAL, FREET4, T3FREE, THYROIDAB in the last 72 hours. Anemia Panel: No results for input(s): VITAMINB12, FOLATE, FERRITIN, TIBC, IRON, RETICCTPCT in the last 72 hours. Urine analysis:    Component Value Date/Time   COLORURINE YELLOW  01/02/2015 1710   APPEARANCEUR CLOUDY* 01/02/2015 1710   LABSPEC 1.013 01/02/2015 1710   PHURINE 5.0 01/02/2015 1710   GLUCOSEU NEGATIVE 01/02/2015 1710   HGBUR NEGATIVE 01/02/2015 1710   BILIRUBINUR NEGATIVE 01/02/2015 1710   KETONESUR NEGATIVE 01/02/2015 1710   PROTEINUR NEGATIVE 01/02/2015 1710   UROBILINOGEN 0.2 09/21/2014 1835   NITRITE NEGATIVE 01/02/2015 1710   LEUKOCYTESUR SMALL* 01/02/2015 1710   Sepsis Labs: '@LABRCNTIP'$ (procalcitonin:4,lacticidven:4)  ) Recent Results (from the past 240 hour(s))  Blood culture (routine x 2)     Status: None (Preliminary result)   Collection Time: 05/12/15  5:25 AM  Result Value Ref Range Status   Specimen Description BLOOD RIGHT ANTECUBITAL  Final   Special Requests BOTTLES DRAWN AEROBIC AND ANAEROBIC 5CC  Final   Culture   Final    NO GROWTH 1 DAY Performed at [facility]    Report Status PENDING  Incomplete  Blood culture (routine x 2)     Status: None (Preliminary result)   Collection Time: 05/12/15  8:20 AM  Result Value Ref Range Status   Specimen Description BLOOD RIGHT THUMB  Final   Special Requests IN PEDIATRIC BOTTLE 2ML  Final   Culture   Final    NO GROWTH < 24 HOURS Performed at [facility]    Report Status PENDING  Incomplete  MRSA PCR Screening     Status: Abnormal   Collection Time: 05/12/15  9:49 AM  Result Value Ref Range Status   MRSA by PCR POSITIVE (A) NEGATIVE Final    Comment:        The GeneXpert MRSA Assay (FDA approved for NASAL specimens only), is one component of a comprehensive MRSA colonization surveillance program. It is not intended to diagnose MRSA infection nor to guide or monitor treatment for MRSA infections. RESULT CALLED TO, READ BACK BY AND VERIFIED WITH: AFAPSAWO,A @ 1144 ON [id number] BY POTEAT,S      Invalid input(s): PROCALCITONIN, LACTICACIDVEN   Radiology Studies: Portable Chest 1 View  05/13/2015  CLINICAL DATA:  Respiratory failure. EXAM: PORTABLE CHEST 1  VIEW COMPARISON:  CT 05/12/2015.  Chest x-ray 05/12/2015 FINDINGS: Stable cardiomegaly. Diffuse bilateral airspace disease again noted. Small right pleural effusion, improved from prior exams. No pneumothorax. IMPRESSION: 1. Diffuse bilateral airspace disease again noted. This could represent diffuse pneumonia and/or pulmonary edema. No significant interim change. Small right pleural effusion, improved from prior exams. 2.  Stable cardiomegaly. Electronically Signed   By: [place]   On: 05/13/2015 07:12        Scheduled Meds: . antiseptic oral rinse  7 mL Mouth Rinse q12n4p  . ceFEPime (MAXIPIME) IV  1 g Intravenous Q24H  . chlorhexidine  15 mL Mouth Rinse BID  . Chlorhexidine Gluconate Cloth  6 each Topical Q0600  . free water  300 mL Per Tube Q6H  . furosemide  40 mg Oral BID  . heparin  5,000 Units Subcutaneous Q8H  . hydrALAZINE  50 mg Per Tube TID PC & HS  . insulin aspart  0-15 Units Subcutaneous Q4H  . mupirocin ointment  1 application Nasal BID  . pantoprazole sodium  40 mg Per Tube Daily  . sodium chloride flush  3 mL Intravenous Q12H  . sodium chloride flush  3 mL Intravenous Q12H   Continuous Infusions:    LOS: 2 days    Time spent: 35 minutes    [name], MD [facility] Pager [phone]  If 7PM-7AM, please contact night-coverage [email or web address] Password [email or web address] 05/14/2015, 1:01 PM

## 2015-05-14 NOTE — Progress Notes (Signed)
This NP attempted to contact family yesterday, and this morning without success.   Messages left. Will continue to attempt contact. Wadie Lessen NP

## 2015-05-14 NOTE — Progress Notes (Addendum)
Pt PEG tube was clogged upon assessment, paged K.Schorr. Pt changed potassium solution to IV runs of potassium. No orders given for pancreatic enzymes to unclog tube. Will pass information onto next shift. Will continue to monitor and reassess as needed.

## 2015-05-14 NOTE — Progress Notes (Signed)
Hypoglycemic Event  CBG: 53  Treatment: 15 GM carbohydrate snack  Symptoms: None  Follow-up CBG: Time:1707 CBG Result:72  Possible Reasons for Event: Inadequate meal intake  Comments/MD notified: Will update MD    Cathie Hoops

## 2015-05-14 NOTE — Progress Notes (Signed)
Hypoglycemic Event  CBG: 48  Treatment: 15 GM carbohydrate snack  Symptoms: None  Follow-up CBG: Time:1639 CBG Result:53  Possible Reasons for Event: Inadequate meal intake  Comments/MD notified: Will update MD    Cathie Hoops

## 2015-05-15 DIAGNOSIS — N189 Chronic kidney disease, unspecified: Secondary | ICD-10-CM

## 2015-05-15 DIAGNOSIS — E875 Hyperkalemia: Secondary | ICD-10-CM | POA: Insufficient documentation

## 2015-05-15 DIAGNOSIS — J810 Acute pulmonary edema: Secondary | ICD-10-CM

## 2015-05-15 DIAGNOSIS — N179 Acute kidney failure, unspecified: Secondary | ICD-10-CM | POA: Insufficient documentation

## 2015-05-15 LAB — CBC
HCT: 29.5 % — ABNORMAL LOW (ref 39.0–52.0)
HEMOGLOBIN: 9.7 g/dL — AB (ref 13.0–17.0)
MCH: 26.4 pg (ref 26.0–34.0)
MCHC: 32.9 g/dL (ref 30.0–36.0)
MCV: 80.2 fL (ref 78.0–100.0)
Platelets: 335 10*3/uL (ref 150–400)
RBC: 3.68 MIL/uL — AB (ref 4.22–5.81)
RDW: 18.2 % — ABNORMAL HIGH (ref 11.5–15.5)
WBC: 6.3 10*3/uL (ref 4.0–10.5)

## 2015-05-15 LAB — GLUCOSE, CAPILLARY
GLUCOSE-CAPILLARY: 102 mg/dL — AB (ref 65–99)
GLUCOSE-CAPILLARY: 114 mg/dL — AB (ref 65–99)
GLUCOSE-CAPILLARY: 132 mg/dL — AB (ref 65–99)
Glucose-Capillary: 91 mg/dL (ref 65–99)

## 2015-05-15 LAB — BASIC METABOLIC PANEL
Anion gap: 12 (ref 5–15)
BUN: 66 mg/dL — AB (ref 6–20)
CHLORIDE: 106 mmol/L (ref 101–111)
CO2: 22 mmol/L (ref 22–32)
CREATININE: 2.28 mg/dL — AB (ref 0.61–1.24)
Calcium: 8.7 mg/dL — ABNORMAL LOW (ref 8.9–10.3)
GFR calc Af Amer: 32 mL/min — ABNORMAL LOW (ref >60)
GFR calc non Af Amer: 27 mL/min — ABNORMAL LOW (ref >60)
GLUCOSE: 105 mg/dL — AB (ref 65–99)
POTASSIUM: 3.8 mmol/L (ref 3.5–5.1)
Sodium: 140 mmol/L (ref 135–145)

## 2015-05-15 MED ORDER — LEVOFLOXACIN 500 MG PO TABS
500.0000 mg | ORAL_TABLET | ORAL | Status: DC
  Administered 2015-05-15: 500 mg via ORAL
  Filled 2015-05-15: qty 1

## 2015-05-15 MED ORDER — LEVOFLOXACIN 500 MG PO TABS: 500.0000 mg | ORAL_TABLET | ORAL | Status: DC

## 2015-05-15 MED ORDER — INSULIN GLARGINE 100 UNIT/ML ~~LOC~~ SOLN: 5.0000 [IU] | Freq: Every day | SUBCUTANEOUS | Status: AC

## 2015-05-15 MED ORDER — FUROSEMIDE 40 MG PO TABS: 40.0000 mg | ORAL_TABLET | Freq: Two times a day (BID) | ORAL | Status: AC

## 2015-05-15 NOTE — Progress Notes (Signed)
Report called to [facility] at [facility] [phone]), transported per [facility] via stretcher

## 2015-05-15 NOTE — Progress Notes (Signed)
Pharmacy Antibiotic Note  [name] is a 70 y.o. male admitted on 05/12/2015 from SNF with SOB, pneumonia.  CT shows multifocal pneumonia vs edema.  Note that he was NOT considered a candidate for HD per nephrology d/t dementia, illness, debility.  Pharmacy has been following for Cefepime dosing.  Patient has remained afebrile, WBC WNL, SCr with some improvement since admission, CrCl~33 ml/min.  Plan: Continue Cefepime 1g IV q24h.  Today is day #4 of Cefepime.  Currently scheduled to end after a total 8 day treatment.  Could consider transition to PO antibiotic for discharge.  Height: 5\' 7"  (170.2 cm) Weight: 207 lb 6.4 oz (94.076 kg) IBW/kg (Calculated) : 66.1  Temp (24hrs), Avg:97.8 F (36.6 C), Min:97.6 F (36.4 C), Max:98.2 F (36.8 C)   Recent Labs Lab 05/12/15 0525 05/12/15 0553 05/12/15 1150 05/13/15 0303 05/14/15 1522 05/15/15 0404  WBC 8.5  --   --  7.2  --  6.3  CREATININE 3.36* 3.60* 3.35* 2.82* 2.24* 2.28*    Estimated Creatinine Clearance: 33 mL/min (by C-G formula based on Cr of 2.28).    No Known Allergies  Antimicrobials this admission: 5/1 Vancomycin >> 5/3 5/1 Cefepime >>   Dose adjustments this admission:  Microbiology results: 5/1 BCx: ngtd 5/1 MRSA PCR: positive 5/1 strep pneumo ur ag: neg  Thank you for allowing pharmacy to be a part of this patient's care.  Hershal Coria, PharmD, BCPS Pager: [phone] 05/15/2015 8:43 AM

## 2015-05-15 NOTE — Discharge Summary (Addendum)
Physician Discharge Summary  [name] ZEQ:[id number] DOB: [birth year] DOA: 05/12/2015  PCP: Georgann Housekeeper, MD  Admit date: 05/12/2015 Discharge date: 05/15/2015  Time spent:  Recommendations for Outpatient Follow-up:  1. Palliative Care FU at SNF 2. Needs SLP re-evaluation in 1-2weeks   Discharge Diagnoses:    Acute on chronic Diastolic CHF   Respiratory failure, acute (HCC)   Alzheimer's disease   Type 2 diabetes mellitus (HCC)   Acute on chronic diastolic congestive heart failure (HCC)   Essential hypertension   Weakness generalized   Acute hyperkalemia   Acute on chronic renal failure (HCC)   Acute pulmonary edema (HCC)   Discharge Condition: stable  Diet recommendation: soft diet  Filed Weights   05/13/15 0500 05/14/15 0548 05/15/15 0444  Weight: 93.8 kg (206 lb 12.7 oz) 93.668 kg (206 lb 8 oz) 94.076 kg (207 lb 6.4 oz)    History of present illness:  chronic kidney disease, diastolic congestive heart failure, poor functional status at baseline, resident at [facility], last hospitalized from 01/02/2015 through 01/07/2015 for acute on chronic renal failure and acute on chronic diastolic congestive heart failure. During that hospitalization he was seen by nephrology, now found to be candidate for hemodialysis. Palliative care met with family members at the time, family wishing to keep him full code. He presents to the emergency room today in acute respiratory failure. At his facility he was diagnosed with pneumonia and started on antibiotics. Overnight he had significant clinical deterioration, going into respiratory distress, hypoxemic  Hospital Course:  Acute hypoxemic respiratory failure -Presented with shortness of breath and required nonrebreather mask while he was in the emergency department. -Acute respiratory failure likely secondary to acute on chronic diastolic CHF  -diuresed with IV lasix, improving with diuresis, negative 7.6L, changed to PO  lasix -weaned O2  Acute on chronic diastolic congestive heart failure -history of stage IV chronic kidney disease.  -improved with lasix, now changed to PO  Acute on chronic kidney disease Patient with history of stage IV chronic kidney disease. Presented with creatinine of 3.6 with BUN of 116.  Baseline creatinine is 2.5-3.5 for the past 6 month. Creatinine improved after diuresis  -seen by Renal in past, not an HD candidate -now on PO lasix  Hyperkalemia/hypokalemia Presented with potassium of 6.6, this is resolved after diuresis is now with hypokalemia. Repleted with supplements.  Insulin-dependent diabetes mellitus He is on Lantus 20 units subcutaneous at bedtime.  Lantus held initially, then resumed at lower dose  Hypertension. Continue home regimen with hydralazine 50 mg 3 times daily per tube  History of dysphagia Tolerating soft diet, takes meds via G tube, reportedly gets some tube feeds at night per SNF  Goals of care Overall poor prognosis, s/p multiple family meetings in the past, would benefit from Palliative care/SNF at Facility We attempted to reach family for Family meeting again, were not able to reach them despite multiple attempts, it has been relayed several times in the past that pt is not an HD candidate and would benefit from Hospice care at SNF, since we were not able to re-discuss goals of care this time, we would recommend Palliative care FU at SNF and continuing long term goals of care discussions with family at SNF   Consultations:  Palliative medicine   Discharge Exam: Filed Vitals:   05/15/15 0444 05/15/15 0919  BP: 131/64 130/63  Pulse: 76   Temp: 98.2 F (36.8 C)   Resp: 18     General: Alert,  awake, confused, hard of hearing Cardiovascular: S1S2/RRR Respiratory: improved basal crackles  Discharge Instructions   Discharge Instructions    Increase activity slowly    Complete by:  As directed           Current Discharge  Medication List    START taking these medications   Details  levofloxacin (LEVAQUIN) 500 MG tablet Take 1 tablet (500 mg total) by mouth every other day. For 3days      CONTINUE these medications which have CHANGED   Details  furosemide (LASIX) 40 MG tablet Place 1 tablet (40 mg total) into feeding tube 2 (two) times daily. Qty: 30 tablet    insulin glargine (LANTUS) 100 UNIT/ML injection Inject 0.05 mLs (5 Units total) into the skin at bedtime.      CONTINUE these medications which have NOT CHANGED   Details  amLODipine (NORVASC) 10 MG tablet Place 10 mg into feeding tube daily.    antiseptic oral rinse (BIOTENE) LIQD 15 mLs by Mouth Rinse route 2 (two) times daily.    ascorbic acid (VITAMIN C) 1000 MG tablet Give 1,000 mg by tube 2 (two) times daily.    aspirin 81 MG chewable tablet Give 81 mg by tube daily.    cyanocobalamin (,VITAMIN B-12,) 1000 MCG/ML injection Inject 1,000 mcg into the muscle every 30 (thirty) days.    feeding supplement (BOOST HIGH PROTEIN) LIQD Take 1 Container by mouth 3 (three) times daily with meals.    ferrous sulfate 220 (44 FE) MG/5ML solution Place 330 mg into feeding tube daily.    hydrALAZINE (APRESOLINE) 50 MG tablet Give 50 mg by tube 4 (four) times daily - after meals and at bedtime.     insulin lispro (HUMALOG) 100 UNIT/ML injection Inject 2-12 Units into the skin 4 (four) times daily. MAR has Insulin given 4 times daily with meals per patient 201-250=2units  251-300units=4units 301-350=6units  351-400=8units 401-450=10units >451=12 u    Multiple Vitamin (MULTIVITAMIN) LIQD 5 mLs by PEG Tube route daily.     pantoprazole sodium (PROTONIX) 40 mg/20 mL PACK Place 40 mg into feeding tube daily.    sertraline (ZOLOFT) 100 MG tablet Place 150 mg into feeding tube daily.     tamsulosin (FLOMAX) 0.4 MG CAPS capsule Take 0.4 mg by mouth.    traZODone (DESYREL) 50 MG tablet 25 mg by Feeding Tube route at bedtime.     Water For Irrigation,  Sterile (FREE WATER) SOLN Place 300 mLs into feeding tube every 6 (six) hours.      STOP taking these medications     amoxicillin-clavulanate (AUGMENTIN) 875-125 MG tablet        No Known Allergies Follow-up Information    Follow up with Wenda Low, MD In 1 week.   Specialty:  Internal Medicine   Contact information:   [address] [place] [address] [phone]        The results of significant diagnostics from this hospitalization (including imaging, microbiology, ancillary and laboratory) are listed below for reference.    Significant Diagnostic Studies: Dg Chest 2 View  05/12/2015  CLINICAL DATA:  Increasing shortness of breath and difficulty breathing. Recent pneumonia diagnosis. EXAM: CHEST  2 VIEW COMPARISON:  01/03/2015 FINDINGS: Shallow inspiration. Cardiac enlargement. Diffuse pulmonary vascular congestion. Bilateral perihilar infiltrates likely representing edema. Superimposed pneumonia not excluded. There is progression since previous study. Small bilateral pleural effusions. No pneumothorax. Calcification of the aorta. IMPRESSION: Cardiac enlargement with pulmonary vascular congestion. Perihilar edema and bilateral pleural  effusions. Electronically Signed   By: Lucienne Capers M.D.   On: 05/12/2015 05:00   Ct Chest Wo Contrast  05/12/2015  CLINICAL DATA:  Pneumonia.  Shortness of breath. EXAM: CT CHEST WITHOUT CONTRAST TECHNIQUE: Multidetector CT imaging of the chest was performed following the standard protocol without IV contrast. COMPARISON:  Plain films earlier today FINDINGS: Mediastinum/Nodes: Heart is borderline in size. Aorta is normal caliber. Scattered coronary artery calcifications. Aortic arch calcifications. Mildly enlarged mediastinal lymph nodes in the prevascular, right paratracheal, AP window, pretracheal and subcarinal areas. Index right paratracheal lymph node has a short axis diameter of 13 mm on image 31. No visible hilar adenopathy  although evaluation of the hila is difficult without IV contrast. No axillary adenopathy. Lungs/Pleura: Moderate to large bilateral pleural effusions. Diffuse airspace disease bilaterally, most pronounced in the upper lobes. Findings could reflect multifocal pneumonia or edema. Upper abdomen: Prior cholecystectomy. Gastrostomy tube noted within the stomach. No acute findings in the upper abdomen. Musculoskeletal: Chest wall soft tissues are unremarkable. No acute bony abnormality. IMPRESSION: Diffuse bilateral airspace disease, most pronounced in the upper lobes with large bilateral pleural effusions. Findings could reflect multifocal pneumonia or edema/failure. Moderate mediastinal adenopathy. While this could be reactive to the process in the lungs, recommend follow-up in 3-6 months after acute symptoms have resolved. Electronically Signed   By: Rolm Baptise M.D.   On: 05/12/2015 07:40   Portable Chest 1 View  05/13/2015  CLINICAL DATA:  Respiratory failure. EXAM: PORTABLE CHEST 1 VIEW COMPARISON:  CT 05/12/2015.  Chest x-ray 05/12/2015 FINDINGS: Stable cardiomegaly. Diffuse bilateral airspace disease again noted. Small right pleural effusion, improved from prior exams. No pneumothorax. IMPRESSION: 1. Diffuse bilateral airspace disease again noted. This could represent diffuse pneumonia and/or pulmonary edema. No significant interim change. Small right pleural effusion, improved from prior exams. 2.  Stable cardiomegaly. Electronically Signed   By: Marcello Moores  Register   On: 05/13/2015 07:12    Microbiology: Recent Results (from the past 240 hour(s))  Blood culture (routine x 2)     Status: None (Preliminary result)   Collection Time: 05/12/15  5:25 AM  Result Value Ref Range Status   Specimen Description BLOOD RIGHT ANTECUBITAL  Final   Special Requests BOTTLES DRAWN AEROBIC AND ANAEROBIC 5CC  Final   Culture   Final    NO GROWTH 2 DAYS Performed at [facility]    Report Status PENDING   Incomplete  Blood culture (routine x 2)     Status: None (Preliminary result)   Collection Time: 05/12/15  8:20 AM  Result Value Ref Range Status   Specimen Description BLOOD RIGHT THUMB  Final   Special Requests IN PEDIATRIC BOTTLE 2ML  Final   Culture   Final    NO GROWTH 2 DAYS Performed at [facility]    Report Status PENDING  Incomplete  MRSA PCR Screening     Status: Abnormal   Collection Time: 05/12/15  9:49 AM  Result Value Ref Range Status   MRSA by PCR POSITIVE (A) NEGATIVE Final    Comment:        The GeneXpert MRSA Assay (FDA approved for NASAL specimens only), is one component of a comprehensive MRSA colonization surveillance program. It is not intended to diagnose MRSA infection nor to guide or monitor treatment for MRSA infections. RESULT CALLED TO, READ BACK BY AND VERIFIED WITH: AFAPSAWO,A @ 1144 ON [id number] BY POTEAT,S      Labs: Basic Metabolic Panel:  Recent Labs  Lab 05/12/15 0525 05/12/15 0553 05/12/15 1150 05/13/15 0303 05/14/15 1522 05/15/15 0404  NA 139 137 142 149* 144 140  K 6.6* 6.5* 4.3 3.1* 2.8* 3.8  CL 109 110 107 112* 106 106  CO2 16*  --  18* 21* 25 22  GLUCOSE 150* 143* 139* 79 52* 105*  BUN 111* 116* 113* 109* 75* 66*  CREATININE 3.36* 3.60* 3.35* 2.82* 2.24* 2.28*  CALCIUM 9.2  --  9.7 9.2 8.7* 8.7*   Liver Function Tests:  Recent Labs Lab 05/13/15 0303  AST 48*  ALT 62  ALKPHOS 80  BILITOT 0.9  PROT 7.4  ALBUMIN 3.1*   No results for input(s): LIPASE, AMYLASE in the last 168 hours. No results for input(s): AMMONIA in the last 168 hours. CBC:  Recent Labs Lab 05/12/15 0525 05/12/15 0553 05/13/15 0303 05/15/15 0404  WBC 8.5  --  7.2 6.3  NEUTROABS 7.2  --   --   --   HGB 9.1* 10.9* 9.2* 9.7*  HCT 28.2* 32.0* 28.2* 29.5*  MCV 82.0  --  80.6 80.2  PLT 306  --  331 335   Cardiac Enzymes:  Recent Labs Lab 05/12/15 0525  TROPONINI 0.04*   BNP: BNP (last 3 results)  Recent Labs  09/24/14 0806  01/02/15 1227 05/12/15 0525  BNP 160.5* 1544.1* 1557.0*    ProBNP (last 3 results) No results for input(s): PROBNP in the last 8760 hours.  CBG:  Recent Labs Lab 05/14/15 1707 05/14/15 2004 05/15/15 0039 05/15/15 0438 05/15/15 0751  GLUCAP 72 85 91 102* 114*       SignedDomenic Polite MD.  [facility] 05/15/2015, 12:14 PM

## 2015-05-15 NOTE — Care Management Important Message (Addendum)
Important Message  Patient Details IM Letter given to Kathy/Case Manager to present to Patient Name: [name] MRN: [id number] Date of Birth: 03-Jan-1946   Medicare Important Message Given:  Yes    Camillo Flaming 05/15/2015, 9:19 AMImportant Message  Patient Details  Name: [name] MRN: [id number] Date of Birth: Sep 23, 1945   Medicare Important Message Given:  Yes    Camillo Flaming 05/15/2015, 9:19 AM

## 2015-05-15 NOTE — Progress Notes (Signed)
Patient is set to discharge back to [facility] today. Patient & sister, [name] made aware. Discharge packet given to RN, Joelene Millin. [facility] called for transport.     Raynaldo Opitz, [facility] Clinical Social Worker cell #: [phone]

## 2015-05-15 NOTE — Progress Notes (Signed)
PT Cancellation Note  Patient Details Name: [name] MRN: [id number] DOB: 02-28-1945   Cancelled Treatment:    Reason Eval/Treat Not Completed: PT screened, no needs identified, will sign off. Pt is long term SNF resident and plans to return to SNF. Will defer PT eval to SNF if warranted. Thanks.    Weston Anna, MPT Pager: [phone]

## 2015-05-17 LAB — CULTURE, BLOOD (ROUTINE X 2)
CULTURE: NO GROWTH
Culture: NO GROWTH

## 2015-08-16 ENCOUNTER — Inpatient Hospital Stay (HOSPITAL_COMMUNITY)
Admission: EM | Admit: 2015-08-16 | Discharge: 2015-09-12 | DRG: 640 | Disposition: E | Payer: Medicare Other | Attending: Internal Medicine | Admitting: Internal Medicine

## 2015-08-16 DIAGNOSIS — N179 Acute kidney failure, unspecified: Secondary | ICD-10-CM | POA: Diagnosis present

## 2015-08-16 DIAGNOSIS — R6520 Severe sepsis without septic shock: Secondary | ICD-10-CM | POA: Diagnosis not present

## 2015-08-16 DIAGNOSIS — G309 Alzheimer's disease, unspecified: Secondary | ICD-10-CM | POA: Diagnosis present

## 2015-08-16 DIAGNOSIS — J9601 Acute respiratory failure with hypoxia: Secondary | ICD-10-CM | POA: Diagnosis present

## 2015-08-16 DIAGNOSIS — R0600 Dyspnea, unspecified: Secondary | ICD-10-CM

## 2015-08-16 DIAGNOSIS — E1122 Type 2 diabetes mellitus with diabetic chronic kidney disease: Secondary | ICD-10-CM | POA: Diagnosis present

## 2015-08-16 DIAGNOSIS — A419 Sepsis, unspecified organism: Secondary | ICD-10-CM | POA: Diagnosis not present

## 2015-08-16 DIAGNOSIS — E119 Type 2 diabetes mellitus without complications: Secondary | ICD-10-CM

## 2015-08-16 DIAGNOSIS — E870 Hyperosmolality and hypernatremia: Principal | ICD-10-CM | POA: Diagnosis present

## 2015-08-16 DIAGNOSIS — G9340 Encephalopathy, unspecified: Secondary | ICD-10-CM | POA: Diagnosis present

## 2015-08-16 DIAGNOSIS — F0280 Dementia in other diseases classified elsewhere without behavioral disturbance: Secondary | ICD-10-CM | POA: Diagnosis present

## 2015-08-16 DIAGNOSIS — Z794 Long term (current) use of insulin: Secondary | ICD-10-CM

## 2015-08-16 DIAGNOSIS — Z931 Gastrostomy status: Secondary | ICD-10-CM

## 2015-08-16 DIAGNOSIS — Z7982 Long term (current) use of aspirin: Secondary | ICD-10-CM

## 2015-08-16 DIAGNOSIS — Z7189 Other specified counseling: Secondary | ICD-10-CM

## 2015-08-16 DIAGNOSIS — E860 Dehydration: Secondary | ICD-10-CM

## 2015-08-16 DIAGNOSIS — Z515 Encounter for palliative care: Secondary | ICD-10-CM | POA: Diagnosis present

## 2015-08-16 DIAGNOSIS — I10 Essential (primary) hypertension: Secondary | ICD-10-CM | POA: Diagnosis present

## 2015-08-16 DIAGNOSIS — Z933 Colostomy status: Secondary | ICD-10-CM

## 2015-08-16 DIAGNOSIS — I130 Hypertensive heart and chronic kidney disease with heart failure and stage 1 through stage 4 chronic kidney disease, or unspecified chronic kidney disease: Secondary | ICD-10-CM | POA: Diagnosis present

## 2015-08-16 DIAGNOSIS — J189 Pneumonia, unspecified organism: Secondary | ICD-10-CM | POA: Diagnosis not present

## 2015-08-16 DIAGNOSIS — Y95 Nosocomial condition: Secondary | ICD-10-CM | POA: Diagnosis not present

## 2015-08-16 DIAGNOSIS — E785 Hyperlipidemia, unspecified: Secondary | ICD-10-CM | POA: Diagnosis present

## 2015-08-16 DIAGNOSIS — N189 Chronic kidney disease, unspecified: Secondary | ICD-10-CM

## 2015-08-16 DIAGNOSIS — Z85038 Personal history of other malignant neoplasm of large intestine: Secondary | ICD-10-CM

## 2015-08-16 DIAGNOSIS — I5032 Chronic diastolic (congestive) heart failure: Secondary | ICD-10-CM | POA: Diagnosis present

## 2015-08-16 DIAGNOSIS — R0602 Shortness of breath: Secondary | ICD-10-CM

## 2015-08-16 DIAGNOSIS — Z66 Do not resuscitate: Secondary | ICD-10-CM | POA: Diagnosis present

## 2015-08-16 DIAGNOSIS — N184 Chronic kidney disease, stage 4 (severe): Secondary | ICD-10-CM | POA: Diagnosis present

## 2015-08-16 DIAGNOSIS — D631 Anemia in chronic kidney disease: Secondary | ICD-10-CM | POA: Diagnosis present

## 2015-08-16 DIAGNOSIS — N390 Urinary tract infection, site not specified: Secondary | ICD-10-CM | POA: Diagnosis not present

## 2015-08-17 ENCOUNTER — Emergency Department (HOSPITAL_COMMUNITY): Payer: Medicare Other

## 2015-08-17 ENCOUNTER — Encounter (HOSPITAL_COMMUNITY): Payer: Self-pay | Admitting: Family Medicine

## 2015-08-17 DIAGNOSIS — E860 Dehydration: Secondary | ICD-10-CM | POA: Diagnosis present

## 2015-08-17 DIAGNOSIS — Z789 Other specified health status: Secondary | ICD-10-CM | POA: Diagnosis not present

## 2015-08-17 DIAGNOSIS — E870 Hyperosmolality and hypernatremia: Secondary | ICD-10-CM | POA: Diagnosis present

## 2015-08-17 DIAGNOSIS — G9340 Encephalopathy, unspecified: Secondary | ICD-10-CM | POA: Diagnosis present

## 2015-08-17 DIAGNOSIS — R0600 Dyspnea, unspecified: Secondary | ICD-10-CM | POA: Diagnosis not present

## 2015-08-17 DIAGNOSIS — Z933 Colostomy status: Secondary | ICD-10-CM | POA: Diagnosis not present

## 2015-08-17 DIAGNOSIS — R6520 Severe sepsis without septic shock: Secondary | ICD-10-CM | POA: Diagnosis not present

## 2015-08-17 DIAGNOSIS — Y95 Nosocomial condition: Secondary | ICD-10-CM | POA: Diagnosis not present

## 2015-08-17 DIAGNOSIS — Z515 Encounter for palliative care: Secondary | ICD-10-CM | POA: Diagnosis not present

## 2015-08-17 DIAGNOSIS — J189 Pneumonia, unspecified organism: Secondary | ICD-10-CM | POA: Diagnosis not present

## 2015-08-17 DIAGNOSIS — N179 Acute kidney failure, unspecified: Secondary | ICD-10-CM | POA: Diagnosis not present

## 2015-08-17 DIAGNOSIS — N184 Chronic kidney disease, stage 4 (severe): Secondary | ICD-10-CM | POA: Diagnosis present

## 2015-08-17 DIAGNOSIS — Z7189 Other specified counseling: Secondary | ICD-10-CM | POA: Diagnosis not present

## 2015-08-17 DIAGNOSIS — G309 Alzheimer's disease, unspecified: Secondary | ICD-10-CM | POA: Diagnosis present

## 2015-08-17 DIAGNOSIS — E1122 Type 2 diabetes mellitus with diabetic chronic kidney disease: Secondary | ICD-10-CM | POA: Diagnosis present

## 2015-08-17 DIAGNOSIS — I5032 Chronic diastolic (congestive) heart failure: Secondary | ICD-10-CM | POA: Diagnosis not present

## 2015-08-17 DIAGNOSIS — N189 Chronic kidney disease, unspecified: Secondary | ICD-10-CM | POA: Diagnosis not present

## 2015-08-17 DIAGNOSIS — I130 Hypertensive heart and chronic kidney disease with heart failure and stage 1 through stage 4 chronic kidney disease, or unspecified chronic kidney disease: Secondary | ICD-10-CM | POA: Diagnosis present

## 2015-08-17 DIAGNOSIS — G301 Alzheimer's disease with late onset: Secondary | ICD-10-CM | POA: Diagnosis not present

## 2015-08-17 DIAGNOSIS — J9601 Acute respiratory failure with hypoxia: Secondary | ICD-10-CM | POA: Diagnosis not present

## 2015-08-17 DIAGNOSIS — Z7982 Long term (current) use of aspirin: Secondary | ICD-10-CM | POA: Diagnosis not present

## 2015-08-17 DIAGNOSIS — Z931 Gastrostomy status: Secondary | ICD-10-CM | POA: Diagnosis not present

## 2015-08-17 DIAGNOSIS — Z66 Do not resuscitate: Secondary | ICD-10-CM | POA: Diagnosis present

## 2015-08-17 DIAGNOSIS — Z85038 Personal history of other malignant neoplasm of large intestine: Secondary | ICD-10-CM | POA: Diagnosis not present

## 2015-08-17 DIAGNOSIS — D631 Anemia in chronic kidney disease: Secondary | ICD-10-CM | POA: Diagnosis present

## 2015-08-17 DIAGNOSIS — A419 Sepsis, unspecified organism: Secondary | ICD-10-CM | POA: Diagnosis not present

## 2015-08-17 DIAGNOSIS — Z794 Long term (current) use of insulin: Secondary | ICD-10-CM | POA: Diagnosis not present

## 2015-08-17 DIAGNOSIS — E785 Hyperlipidemia, unspecified: Secondary | ICD-10-CM | POA: Diagnosis present

## 2015-08-17 DIAGNOSIS — F0280 Dementia in other diseases classified elsewhere without behavioral disturbance: Secondary | ICD-10-CM | POA: Diagnosis present

## 2015-08-17 DIAGNOSIS — N390 Urinary tract infection, site not specified: Secondary | ICD-10-CM | POA: Diagnosis not present

## 2015-08-17 LAB — BASIC METABOLIC PANEL
ANION GAP: 13 (ref 5–15)
ANION GAP: 8 (ref 5–15)
ANION GAP: 7 (ref 5–15)
Anion gap: 7 (ref 5–15)
BUN: 160 mg/dL — ABNORMAL HIGH (ref 6–20)
BUN: 149 mg/dL — AB (ref 6–20)
BUN: 142 mg/dL — AB (ref 6–20)
BUN: 136 mg/dL — ABNORMAL HIGH (ref 6–20)
CALCIUM: 10.3 mg/dL (ref 8.9–10.3)
CALCIUM: 10.2 mg/dL (ref 8.9–10.3)
CALCIUM: 9.6 mg/dL (ref 8.9–10.3)
CALCIUM: 10.1 mg/dL (ref 8.9–10.3)
CO2: 18 mmol/L — ABNORMAL LOW (ref 22–32)
CO2: 21 mmol/L — ABNORMAL LOW (ref 22–32)
CO2: 19 mmol/L — ABNORMAL LOW (ref 22–32)
CO2: 19 mmol/L — AB (ref 22–32)
CREATININE: 2.98 mg/dL — AB (ref 0.61–1.24)
Chloride: 125 mmol/L — ABNORMAL HIGH (ref 101–111)
Chloride: 125 mmol/L — ABNORMAL HIGH (ref 101–111)
Chloride: 121 mmol/L — ABNORMAL HIGH (ref 101–111)
Chloride: 123 mmol/L — ABNORMAL HIGH (ref 101–111)
Creatinine, Ser: 3.07 mg/dL — ABNORMAL HIGH (ref 0.61–1.24)
Creatinine, Ser: 3.02 mg/dL — ABNORMAL HIGH (ref 0.61–1.24)
Creatinine, Ser: 2.99 mg/dL — ABNORMAL HIGH (ref 0.61–1.24)
GFR calc Af Amer: 22 mL/min — ABNORMAL LOW (ref >60)
GFR calc Af Amer: 23 mL/min — ABNORMAL LOW (ref >60)
GFR calc Af Amer: 23 mL/min — ABNORMAL LOW (ref >60)
GFR calc Af Amer: 23 mL/min — ABNORMAL LOW (ref >60)
GFR calc non Af Amer: 20 mL/min — ABNORMAL LOW (ref >60)
GFR, EST NON AFRICAN AMERICAN: 19 mL/min — AB (ref >60)
GFR, EST NON AFRICAN AMERICAN: 19 mL/min — AB (ref >60)
GFR, EST NON AFRICAN AMERICAN: 20 mL/min — AB (ref >60)
GLUCOSE: 256 mg/dL — AB (ref 65–99)
Glucose, Bld: 213 mg/dL — ABNORMAL HIGH (ref 65–99)
Glucose, Bld: 260 mg/dL — ABNORMAL HIGH (ref 65–99)
Glucose, Bld: 393 mg/dL — ABNORMAL HIGH (ref 65–99)
POTASSIUM: 5 mmol/L (ref 3.5–5.1)
POTASSIUM: 5 mmol/L (ref 3.5–5.1)
POTASSIUM: 4.3 mmol/L (ref 3.5–5.1)
Potassium: 4.4 mmol/L (ref 3.5–5.1)
SODIUM: 156 mmol/L — AB (ref 135–145)
SODIUM: 154 mmol/L — AB (ref 135–145)
SODIUM: 147 mmol/L — AB (ref 135–145)
Sodium: 149 mmol/L — ABNORMAL HIGH (ref 135–145)

## 2015-08-17 LAB — I-STAT CG4 LACTIC ACID, ED: LACTIC ACID, VENOUS: 1.6 mmol/L (ref 0.5–1.9)

## 2015-08-17 LAB — COMPREHENSIVE METABOLIC PANEL
ALT: 57 U/L (ref 17–63)
ANION GAP: 9 (ref 5–15)
AST: 40 U/L (ref 15–41)
Albumin: 3.3 g/dL — ABNORMAL LOW (ref 3.5–5.0)
Alkaline Phosphatase: 157 U/L — ABNORMAL HIGH (ref 38–126)
BILIRUBIN TOTAL: 0.4 mg/dL (ref 0.3–1.2)
BUN: 161 mg/dL — AB (ref 6–20)
CHLORIDE: 125 mmol/L — AB (ref 101–111)
CO2: 20 mmol/L — ABNORMAL LOW (ref 22–32)
Calcium: 11.1 mg/dL — ABNORMAL HIGH (ref 8.9–10.3)
Creatinine, Ser: 3.27 mg/dL — ABNORMAL HIGH (ref 0.61–1.24)
GFR, EST AFRICAN AMERICAN: 21 mL/min — AB (ref >60)
GFR, EST NON AFRICAN AMERICAN: 18 mL/min — AB (ref >60)
Glucose, Bld: 229 mg/dL — ABNORMAL HIGH (ref 65–99)
POTASSIUM: 4.9 mmol/L (ref 3.5–5.1)
Sodium: 154 mmol/L — ABNORMAL HIGH (ref 135–145)
TOTAL PROTEIN: 8.7 g/dL — AB (ref 6.5–8.1)

## 2015-08-17 LAB — CBC WITH DIFFERENTIAL/PLATELET
BASOS ABS: 0 10*3/uL (ref 0.0–0.1)
Basophils Relative: 0 %
EOS PCT: 0 %
Eosinophils Absolute: 0 10*3/uL (ref 0.0–0.7)
HEMATOCRIT: 39.5 % (ref 39.0–52.0)
HEMOGLOBIN: 12 g/dL — AB (ref 13.0–17.0)
LYMPHS ABS: 1.3 10*3/uL (ref 0.7–4.0)
LYMPHS PCT: 10 %
MCH: 25.1 pg — ABNORMAL LOW (ref 26.0–34.0)
MCHC: 30.4 g/dL (ref 30.0–36.0)
MCV: 82.5 fL (ref 78.0–100.0)
MONOS PCT: 5 %
Monocytes Absolute: 0.7 10*3/uL (ref 0.1–1.0)
NEUTROS PCT: 85 %
Neutro Abs: 11.4 10*3/uL — ABNORMAL HIGH (ref 1.7–7.7)
Platelets: 166 10*3/uL (ref 150–400)
RBC: 4.79 MIL/uL (ref 4.22–5.81)
RDW: 18.5 % — ABNORMAL HIGH (ref 11.5–15.5)
WBC: 13.4 10*3/uL — AB (ref 4.0–10.5)

## 2015-08-17 LAB — I-STAT CHEM 8, ED
BUN: >140 mg/dL — ABNORMAL HIGH (ref 6–20)
CREATININE: 3.1 mg/dL — AB (ref 0.61–1.24)
Calcium, Ion: 1.47 mmol/L — ABNORMAL HIGH (ref 1.12–1.23)
Chloride: 131 mmol/L (ref 101–111)
Glucose, Bld: 229 mg/dL — ABNORMAL HIGH (ref 65–99)
HEMATOCRIT: 38 % — AB (ref 39.0–52.0)
HEMOGLOBIN: 12.9 g/dL — AB (ref 13.0–17.0)
Potassium: 5 mmol/L (ref 3.5–5.1)
SODIUM: 160 mmol/L — AB (ref 135–145)
TCO2: 23 mmol/L (ref 0–100)

## 2015-08-17 LAB — CBC
HEMATOCRIT: 37.1 % — AB (ref 39.0–52.0)
Hemoglobin: 11.3 g/dL — ABNORMAL LOW (ref 13.0–17.0)
MCH: 25.2 pg — AB (ref 26.0–34.0)
MCHC: 30.5 g/dL (ref 30.0–36.0)
MCV: 82.6 fL (ref 78.0–100.0)
PLATELETS: 128 10*3/uL — AB (ref 150–400)
RBC: 4.49 MIL/uL (ref 4.22–5.81)
RDW: 18.4 % — AB (ref 11.5–15.5)
WBC: 12.9 10*3/uL — AB (ref 4.0–10.5)

## 2015-08-17 LAB — URINALYSIS, ROUTINE W REFLEX MICROSCOPIC
Bilirubin Urine: NEGATIVE
Glucose, UA: NEGATIVE mg/dL
Hgb urine dipstick: NEGATIVE
KETONES UR: NEGATIVE mg/dL
NITRITE: POSITIVE — AB
PH: 5.5 (ref 5.0–8.0)
Protein, ur: 30 mg/dL — AB
SPECIFIC GRAVITY, URINE: 1.022 (ref 1.005–1.030)

## 2015-08-17 LAB — MRSA PCR SCREENING: MRSA by PCR: POSITIVE — AB

## 2015-08-17 LAB — I-STAT TROPONIN, ED: Troponin i, poc: 0.23 ng/mL (ref 0.00–0.08)

## 2015-08-17 LAB — GLUCOSE, CAPILLARY
GLUCOSE-CAPILLARY: 329 mg/dL — AB (ref 65–99)
Glucose-Capillary: 234 mg/dL — ABNORMAL HIGH (ref 65–99)
Glucose-Capillary: 305 mg/dL — ABNORMAL HIGH (ref 65–99)

## 2015-08-17 LAB — URINE MICROSCOPIC-ADD ON: RBC / HPF: NONE SEEN RBC/hpf (ref 0–5)

## 2015-08-17 MED ORDER — DEXTROSE 5 % IV SOLN
INTRAVENOUS | Status: DC
  Administered 2015-08-17: 05:00:00 via INTRAVENOUS

## 2015-08-17 MED ORDER — FREE WATER
300.0000 mL | Status: DC
  Administered 2015-08-17 (×3): 300 mL

## 2015-08-17 MED ORDER — INSULIN ASPART 100 UNIT/ML ~~LOC~~ SOLN
0.0000 [IU] | SUBCUTANEOUS | Status: DC
  Administered 2015-08-17: 4 [IU] via SUBCUTANEOUS
  Administered 2015-08-17: 7 [IU] via SUBCUTANEOUS
  Administered 2015-08-17: 5 [IU] via SUBCUTANEOUS
  Administered 2015-08-17: 7 [IU] via SUBCUTANEOUS
  Administered 2015-08-18 (×3): 1 [IU] via SUBCUTANEOUS
  Administered 2015-08-18 – 2015-08-19 (×2): 2 [IU] via SUBCUTANEOUS
  Administered 2015-08-19 (×2): 3 [IU] via SUBCUTANEOUS
  Administered 2015-08-19 – 2015-08-20 (×3): 1 [IU] via SUBCUTANEOUS

## 2015-08-17 MED ORDER — FREE WATER: 300.0000 mL | Status: DC

## 2015-08-17 MED ORDER — FREE WATER
300.0000 mL | Freq: Four times a day (QID) | Status: DC
  Administered 2015-08-17: 300 mL

## 2015-08-17 MED ORDER — VANCOMYCIN HCL 10 G IV SOLR
1500.0000 mg | Freq: Once | INTRAVENOUS | Status: AC
  Administered 2015-08-17: 1500 mg via INTRAVENOUS
  Filled 2015-08-17: qty 1500

## 2015-08-17 MED ORDER — ACETAMINOPHEN 650 MG RE SUPP
650.0000 mg | Freq: Four times a day (QID) | RECTAL | Status: DC | PRN
  Administered 2015-08-19: 650 mg via RECTAL
  Filled 2015-08-17: qty 1

## 2015-08-17 MED ORDER — PANTOPRAZOLE SODIUM 40 MG PO PACK
40.0000 mg | PACK | Freq: Every day | ORAL | Status: DC
  Administered 2015-08-17 – 2015-08-21 (×4): 40 mg
  Filled 2015-08-17 (×2): qty 20

## 2015-08-17 MED ORDER — INSULIN GLARGINE 100 UNIT/ML ~~LOC~~ SOLN
5.0000 [IU] | Freq: Every day | SUBCUTANEOUS | Status: DC
  Administered 2015-08-17 – 2015-08-19 (×3): 5 [IU] via SUBCUTANEOUS
  Filled 2015-08-17 (×4): qty 0.05

## 2015-08-17 MED ORDER — FERROUS SULFATE 300 (60 FE) MG/5ML PO SYRP
300.0000 mg | ORAL_SOLUTION | Freq: Every day | ORAL | Status: DC
  Administered 2015-08-17 – 2015-08-20 (×4): 300 mg
  Filled 2015-08-17 (×4): qty 5

## 2015-08-17 MED ORDER — ENOXAPARIN SODIUM 30 MG/0.3ML ~~LOC~~ SOLN
30.0000 mg | SUBCUTANEOUS | Status: DC
  Administered 2015-08-17 – 2015-08-20 (×4): 30 mg via SUBCUTANEOUS
  Filled 2015-08-17 (×4): qty 0.3

## 2015-08-17 MED ORDER — ASPIRIN 81 MG PO CHEW
81.0000 mg | CHEWABLE_TABLET | Freq: Every day | ORAL | Status: DC
  Administered 2015-08-17 – 2015-08-20 (×4): 81 mg via ORAL
  Filled 2015-08-17 (×4): qty 1

## 2015-08-17 MED ORDER — AMLODIPINE BESYLATE 10 MG PO TABS
10.0000 mg | ORAL_TABLET | Freq: Every day | ORAL | Status: DC
  Administered 2015-08-17 – 2015-08-18 (×2): 10 mg
  Filled 2015-08-17 (×2): qty 1

## 2015-08-17 MED ORDER — ACETAMINOPHEN 325 MG PO TABS
650.0000 mg | ORAL_TABLET | Freq: Four times a day (QID) | ORAL | Status: DC | PRN
  Administered 2015-08-17 – 2015-08-19 (×2): 650 mg via ORAL
  Filled 2015-08-17 (×2): qty 2

## 2015-08-17 MED ORDER — SODIUM CHLORIDE 0.9 % IV BOLUS (SEPSIS): 1000.0000 mL | Freq: Once | INTRAVENOUS | Status: DC

## 2015-08-17 MED ORDER — DEXTROSE 5 % IV SOLN
1.0000 g | INTRAVENOUS | Status: DC
  Administered 2015-08-17 – 2015-08-20 (×4): 1 g via INTRAVENOUS
  Filled 2015-08-17 (×4): qty 1

## 2015-08-17 MED ORDER — PIPERACILLIN-TAZOBACTAM 3.375 G IVPB 30 MIN
3.3750 g | Freq: Once | INTRAVENOUS | Status: AC
  Administered 2015-08-17: 3.375 g via INTRAVENOUS
  Filled 2015-08-17: qty 50

## 2015-08-17 MED ORDER — VANCOMYCIN HCL IN DEXTROSE 1-5 GM/200ML-% IV SOLN
1000.0000 mg | INTRAVENOUS | Status: DC
  Administered 2015-08-19: 1000 mg via INTRAVENOUS
  Filled 2015-08-17: qty 200

## 2015-08-17 MED ORDER — HYDRALAZINE HCL 50 MG PO TABS
50.0000 mg | ORAL_TABLET | Freq: Four times a day (QID) | ORAL | Status: DC
  Administered 2015-08-17 – 2015-08-18 (×7): 50 mg
  Filled 2015-08-17 (×9): qty 1

## 2015-08-17 MED ORDER — BOOST PLUS PO LIQD
1.0000 | Freq: Three times a day (TID) | ORAL | Status: DC
  Administered 2015-08-17 (×3): 237 mL via ORAL
  Filled 2015-08-17 (×14): qty 237

## 2015-08-17 MED ORDER — SERTRALINE HCL 50 MG PO TABS
150.0000 mg | ORAL_TABLET | Freq: Every day | ORAL | Status: DC
  Administered 2015-08-17 – 2015-08-21 (×5): 150 mg
  Filled 2015-08-17 (×5): qty 1

## 2015-08-17 MED ORDER — FREE WATER
300.0000 mL | Freq: Three times a day (TID) | Status: DC
  Administered 2015-08-17: 300 mL

## 2015-08-17 MED ORDER — FREE WATER: 200.0000 mL | Freq: Three times a day (TID) | Status: DC

## 2015-08-17 MED ORDER — TAMSULOSIN HCL 0.4 MG PO CAPS
0.4000 mg | ORAL_CAPSULE | Freq: Every day | ORAL | Status: DC
  Administered 2015-08-17 – 2015-08-20 (×2): 0.4 mg via ORAL
  Filled 2015-08-17 (×3): qty 1

## 2015-08-17 MED ORDER — FREE WATER
200.0000 mL | Freq: Three times a day (TID) | Status: DC
  Administered 2015-08-17 – 2015-08-18 (×2): 200 mL

## 2015-08-17 MED ORDER — FREE WATER
200.0000 mL | Freq: Three times a day (TID) | Status: DC
Start: 2015-08-18 — End: 2015-08-17

## 2015-08-17 MED ORDER — SODIUM CHLORIDE 0.9 % IV BOLUS (SEPSIS)
1000.0000 mL | Freq: Once | INTRAVENOUS | Status: AC
  Administered 2015-08-17: 1000 mL via INTRAVENOUS

## 2015-08-17 NOTE — ED Notes (Signed)
Phlebotomy at bedside at this time.

## 2015-08-17 NOTE — ED Notes (Signed)
Dr. Danford at bedside at this time.  °

## 2015-08-17 NOTE — H&P (Signed)
History and Physical  Patient Name: [name]     [id number]    DOB: 1945-03-13    DOA: 08/22/2015 PCP: Wenda Low, MD   Patient coming from: [facility]  Chief Complaint: Altered mental status  HPI: [name] is a 70 y.o. 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  Caveat that patient is obtunded and unable to provide his own history.    Per nursing report, the patient is normally able to communicate, but over the last 24 hours has become lethargic and minimally responsive.  Also, per notes, he had been treated recently for aspiration pneumonia.    ED course: -Low grade temperature, heart rate normal, respirations >20 per minute, BP and oxygen saturation normal -Na 154, Cr 3.2 (baseline 2.2-3), WBC 13K -CT head unremarkable -CXR showed basal opacities, possibly consistent with resolving pneumonia -UA showed mild pyuria -CODE SEPSIS was called and he was administered vancomycin and cefepime and TRH were asked to evaluate for admission  His last two hospitalizations were for hypoxic respiratory failure and AKI from CHF flare (diuresed both times about 8L).  He was discharged with Hospice follow up both times, but continues to be Full Code.     ROS: Review of Systems  Unable to perform ROS: Patient unresponsive         Past Medical History:  Diagnosis Date  . A-fib ([place])   . Alzheimer's dementia   . Anemia   . CHF (congestive heart failure) ([place])   . Colon cancer ([place]) 2009   s/p Colectomy  . Delusional disorder ([place])   . Dementia   . Depression   . Diabetes mellitus without complication ([place])   . Dysphagia   . Falls frequently   . HOH (hard of hearing)   . Hyperlipidemia   . Hypertension   . Hypokalemia   . Hyponatremia   . Renal insufficiency   . Stroke ([place])   .  UTI (lower urinary tract infection)     Past Surgical History:  Procedure Laterality Date  . ABDOMINAL ADHESION SURGERY    . CARDIAC CATHETERIZATION    . CHOLECYSTECTOMY    . COLECTOMY  09/06/2007  . COLON SURGERY    . ILEOSTOMY    . PEG TUBE REMOVAL      Social History: Patient lives at [facility].  Chart review suggests that his family is local and are involved in his care.    No Known Allergies  Family history: family history is not on file.  Unable to obtain due to patient mentation.  Prior to Admission medications   Medication Sig Start Date End Date Taking? Authorizing Provider  amLODipine (NORVASC) 10 MG tablet Place 10 mg into feeding tube daily. 04/23/15   Historical Provider, MD  antiseptic oral rinse (BIOTENE) LIQD 15 mLs by Mouth Rinse route 2 (two) times daily.    Historical Provider, MD  ascorbic acid (VITAMIN C) 1000 MG tablet Give 1,000 mg by tube 2 (two) times daily.    Historical Provider, MD  aspirin 81 MG chewable tablet Give 81 mg by tube daily.    Historical Provider, MD  cyanocobalamin (,VITAMIN B-12,) 1000 MCG/ML injection Inject 1,000 mcg into the muscle every 30 (thirty) days.    Historical Provider, MD  feeding supplement (BOOST HIGH PROTEIN) LIQD Take 1 Container  by mouth 3 (three) times daily with meals.    Historical Provider, MD  ferrous sulfate 220 (44 FE) MG/5ML solution Place 330 mg into feeding tube daily.    Historical Provider, MD  furosemide (LASIX) 40 MG tablet Place 1 tablet (40 mg total) into feeding tube 2 (two) times daily. 05/15/15   [name], MD  hydrALAZINE (APRESOLINE) 50 MG tablet Give 50 mg by tube 4 (four) times daily - after meals and at bedtime.     Historical Provider, MD  insulin glargine (LANTUS) 100 UNIT/ML injection Inject 0.05 mLs (5 Units total) into the skin at bedtime. 05/15/15   [name], MD  insulin lispro (HUMALOG) 100 UNIT/ML injection Inject 2-12 Units into the skin 4 (four) times daily. MAR has  Insulin given 4 times daily with meals per patient 201-250=2units  251-300units=4units 301-350=6units  351-400=8units 401-450=10units >451=12 u    Historical Provider, MD  levofloxacin (LEVAQUIN) 500 MG tablet Take 1 tablet (500 mg total) by mouth every other day. For 3days 05/15/15   [name], MD  Multiple Vitamin (MULTIVITAMIN) LIQD 5 mLs by PEG Tube route daily.     Historical Provider, MD  pantoprazole sodium (PROTONIX) 40 mg/20 mL PACK Place 40 mg into feeding tube daily.    Historical Provider, MD  sertraline (ZOLOFT) 100 MG tablet Place 150 mg into feeding tube daily.  04/01/15   Historical Provider, MD  tamsulosin (FLOMAX) 0.4 MG CAPS capsule Take 0.4 mg by mouth.    Historical Provider, MD  traZODone (DESYREL) 50 MG tablet 25 mg by Feeding Tube route at bedtime.     Historical Provider, MD  Water For Irrigation, Sterile (FREE WATER) SOLN Place 300 mLs into feeding tube every 6 (six) hours. 01/07/15   [name], MD       Physical Exam: BP 138/74   Pulse 72   Temp 99.5 F (37.5 C) (Rectal)   Resp 21   SpO2 100%  General appearance: Well-developed appearing elderly male, obtunded, groans spontaneously.   Eyes: Anicteric, conjunctiva pink, lids and lashes normal. Forces eyes shut.    ENT: No nasal deformity, discharge, or epistaxis.  Oral mucus membranes dry.   Lymph: No cervical or supraclavicular lymphadenopathy. Skin: Warm and diaphoretic.  No suspicious rashes or lesions.  Toe ulcers both feet, do not appear infected at time of admission. Cardiac: Tachycardic, nl S1-S2, no murmurs appreciated.  No LE edema.  Radial and DP pulses 2+ and symmetric. Respiratory: Tachypneic.  Coarse airway sounds, no wheezes. GI: Abdomen soft without rigidity.  Grimaces with exam diffusely. No ascites, distension, hepatosplenomegaly.   MSK: No deformities or effusions. Neuro: Pupils reactive.  Grimaces to noxious stimuli.  Does not follow commands.  Psych: Not able to  assess.    Labs on Admission:  I have personally reviewed following labs and imaging studies: CBC:  Recent Labs Lab 08/17/15 0037 08/17/15 0051  WBC 13.4*  --   NEUTROABS 11.4*  --   HGB 12.0* 12.9*  HCT 39.5 38.0*  MCV 82.5  --   PLT 166  --    Basic Metabolic Panel:  Recent Labs Lab 08/17/15 0037 08/17/15 0051  NA 154* 160*  K 4.9 5.0  CL 125* 131*  CO2 20*  --   GLUCOSE 229* 229*  BUN 161* >140*  CREATININE 3.27* 3.10*  CALCIUM 11.1*  --    GFR: CrCl cannot be calculated (Unknown ideal weight.).  Liver Function Tests:  Recent Labs Lab 08/17/15 0037  AST 40  ALT 57  ALKPHOS 157*  BILITOT 0.4  PROT 8.7*  ALBUMIN 3.3*    Radiological Exams on Admission: Personally reviewed: Ct Head Wo Contrast  Result Date: 08/17/2015 CLINICAL DATA:  Altered mental status. History of Alzheimer's dementia, diabetes, stroke, hyperlipidemia, hypertension. EXAM: CT HEAD WITHOUT CONTRAST TECHNIQUE: Contiguous axial images were obtained from the base of the skull through the vertex without intravenous contrast. COMPARISON:  CT HEAD October 29, 2007 FINDINGS: INTRACRANIAL CONTENTS: Moderate to severe ventriculomegaly on the basis of global parenchymal brain volume loss No intraparenchymal hemorrhage, mass effect nor midline shift. Patchy supratentorial white matter hypodensities are within normal range for patient's age and though non-specific likely represent chronic small vessel ischemic disease. 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 No acute large vascular territory infarcts. No abnormal extra-axial fluid collections. Basal cisterns are patent. Moderate calcific atherosclerosis of the carotid siphons. ORBITS: The included ocular globes and orbital contents are non-suspicious. SINUSES: Chronic LEFT sphenoid sinusitis with widened sphenoid ethmoidal recess. Mastoid air cells are well aerated. SKULL/SOFT TISSUES: No skull fracture. No significant soft tissue swelling. Old distal nasal  bone fractures. IMPRESSION: No acute intracranial process. Moderate to severe global parenchymal brain volume loss for age. Mild chronic small vessel ischemic disease. Electronically Signed   By: Elon Alas M.D.   On: 08/17/2015 01:49   Dg Chest Port 1 View  Result Date: 08/17/2015 CLINICAL DATA:  Altered mental status. EXAM: PORTABLE CHEST 1 VIEW COMPARISON:  05/13/2015 FINDINGS: Mild decrease in cardiomegaly from prior. There is atherosclerosis of the thoracic aorta. Improvement in diffuse lung opacity from prior with minimal residual or recurrent patchy lower lobe airspace opacities. No evidence of pleural effusion or pneumothorax. Stable appearance of the osseous structures. IMPRESSION: Ill-defined patchy bibasilar opacities, may reflect atelectasis or scarring. Residual or recurrent pneumonia or pulmonary edema are felt less likely. Electronically Signed   By: Jeb Levering M.D.   On: 08/17/2015 00:53    EKG: Independently reviewed. Juntional tachycardia versus atrial fibrillation, rate 70s.    Assessment/Plan 1. Hypernatremia:  Suspect this is more dehydration from furosemide and lack-of-free-water-in-dementia than insensible losses from fever. Volume resuscitated with normal saline in ER. -D5 W at 150 per hour to correct at a rate of 0.5 mmol/hr -q4hrs BMP -Continue free water flushes in PEG   2. Acute on chronic kidney injury:  Creatinine has ranged form 2-4 in last year.  Most recently low-2s, now likely up in setting of diuresis, dehydration. -Trend BMP  3. SIRS criteria:  Meets SIRS criteria and history of recent aspiration pneumonia.   -Vancomycin and cefepime for sepsis unknown source -Follow culture data and de-escalate if normal  4. NIDDM:  Diet controlled  5. Anemia of CKD:  Stable -Continue iron  6. HTN and chronic diastolic CHF:  Normotensive at admission.  Hypovolemic at admission.  Discharge weight in May 2017 was 207 lbs (188 at admission  today). -Continue amlodipine, hydralazine -Continue aspirin -Hold furosemide -Strict I/Os, daily weights  7. Alzheimers:    8. Abnormal ECG: -Repeat ECG  9. Other medications: -Continue sertraline -Continue Flomax  10. Colon cancer with ileostomy -Consult to [facility]       DVT prophylaxis: Lovenox  Code Status: FULL  Family Communication: None present  Disposition Plan: Anticipate slow correction of hypernatremia.  Will empirically treat infection/sepsis and follow culture data.   Consults called: None Admission status: INPATIENT, telemetry    Medical decision making: Patient seen at 2:40 AM on 08/17/2015.  The patient was discussed with  Dr. Claudine Mouton. What exists of the patient's chart was reviewed in depth.         Edwin Dada [facility] Pager [phone]

## 2015-08-17 NOTE — ED Triage Notes (Signed)
Patient arrived to ED via [facility]. EMS reports called to [facility] [place] where patient is a resident. Staff reported patient with altered mental status - decreased LOC. EMS unable to obtain last known normal. No verbal response to EMS. Responds to voice and pain.  BP 126/82, Pulse 88, Resp 28, 100% on NRB. 236 CBG. 22 gauge IV in L wrist.  Hx - DM, dysphagia, aspiration PNA 2 weeks ago.

## 2015-08-17 NOTE — ED Notes (Signed)
Patient transported to CT 

## 2015-08-17 NOTE — Progress Notes (Signed)
Pt family at bedside.  Discussed Pt current condition with family.  Spoke with family about meeting with palliative care to formulate care plan for Pt.  Family open to meeting with palliative team on Monday 08/18/2014.  Will notify oncoming charge nurse for continuity of care.

## 2015-08-17 NOTE — ED Provider Notes (Addendum)
[facility] DEPT Provider Note   CSN: [id number] Arrival date & time: 08/15/2015  2359  First Provider Contact:  First MD Initiated Contact with Patient 08/17/15 0006     By signing my name below, I, [name], attest that this documentation has been prepared under the direction and in the presence of Derrick Balls, MD . Electronically Signed: Evelene [name], Scribe. 08/17/2015. 12:18 AM.  History   Chief Complaint Chief Complaint  Patient presents with  . Altered Mental Status   LEVEL 5 CAVEAT DUE TO MENTAL STATUS  The history is provided by the EMS personnel. No language interpreter was used.    HPI Comments:  [name] is a 70 y.o. male with a history of Alzheimer's dementia, dysphagia and DM who presents to the Emergency Department from [facility] and [facility]  via EMS for [place]. The facility was unable to say when pt was last known well. They reported to EMS that the pt normally verbally communicative but is not today. Pt also has a h/o aspirational PNA 2 weeks ago.    Past Medical History:  Diagnosis Date  . A-fib ([place])   . Alzheimer's dementia   . Anemia   . CHF (congestive heart failure) ([place])   . Colon cancer ([place]) 2009   s/p Colectomy  . Delusional disorder ([place])   . Dementia   . Depression   . Diabetes mellitus without complication ([place])   . Dysphagia   . Falls frequently   . HOH (hard of hearing)   . Hyperlipidemia   . Hypertension   . Hypokalemia   . Hyponatremia   . Renal insufficiency   . Stroke ([place])   . UTI (lower urinary tract infection)     Patient Active Problem List   Diagnosis Date Noted  . Acute hyperkalemia   . Acute on chronic renal failure ([place])   . Acute pulmonary edema (HCC)   . Respiratory failure, acute ([place]) 05/12/2015  . Respiratory failure ([place]) 05/12/2015  . Weakness generalized   . DNR (do not resuscitate) discussion 01/06/2015  . Acute kidney injury ([place]) 01/02/2015  . ARF (acute renal failure) ([place])  01/02/2015  . Bradycardia 10/31/2014  . Hyponatremia 10/31/2014  . Acute on chronic diastolic congestive heart failure ([place]) 09/20/2014  . Elevated troponin 09/20/2014  . Essential hypertension 09/20/2014  . Acute renal failure superimposed on stage 3 chronic kidney disease ([place])   . Hyperkalemia   . Congestive heart disease ([place])   . Diabetes ([place]) 11/03/2013  . Hyperlipidemia 11/02/2013  . Edema 08/09/2013  . Hypopotassemia 06/28/2013  . Chronic kidney disease 08/14/2012  . Other dysphagia 07/12/2012  . Palliative care encounter 07/12/2012  . Acute renal failure ([place]) 07/03/2012  . UTI (lower urinary tract infection) 07/03/2012  . Cellulitis and abscess of leg 07/03/2012  . Anemia in chronic renal disease 06/01/2012  . Type 2 diabetes mellitus ([place]) 06/01/2012  . Benign renovascular hypertension 06/01/2012  . FISTULA, INTESTINE 02/23/2010  . ADENOCARCINOMA, COLON, HX OF 04/23/2009  . DM 11/18/2008  . Alzheimer's disease 11/18/2008  . NONSPECIFIC ABN FINDING RAD & OTH EXAM GI TRACT 11/18/2008    Past Surgical History:  Procedure Laterality Date  . ABDOMINAL ADHESION SURGERY    . CARDIAC CATHETERIZATION    . CHOLECYSTECTOMY    . COLECTOMY  09/06/2007  . COLON SURGERY    . ILEOSTOMY    . PEG TUBE REMOVAL         Home Medications  Prior to Admission medications   Medication Sig Start Date End Date Taking? Authorizing Provider  amLODipine (NORVASC) 10 MG tablet Place 10 mg into feeding tube daily. 04/23/15   Historical Provider, MD  antiseptic oral rinse (BIOTENE) LIQD 15 mLs by Mouth Rinse route 2 (two) times daily.    Historical Provider, MD  ascorbic acid (VITAMIN C) 1000 MG tablet Give 1,000 mg by tube 2 (two) times daily.    Historical Provider, MD  aspirin 81 MG chewable tablet Give 81 mg by tube daily.    Historical Provider, MD  cyanocobalamin (,VITAMIN B-12,) 1000 MCG/ML injection Inject 1,000 mcg into the muscle every 30 (thirty) days.    Historical  Provider, MD  feeding supplement (BOOST HIGH PROTEIN) LIQD Take 1 Container by mouth 3 (three) times daily with meals.    Historical Provider, MD  ferrous sulfate 220 (44 FE) MG/5ML solution Place 330 mg into feeding tube daily.    Historical Provider, MD  furosemide (LASIX) 40 MG tablet Place 1 tablet (40 mg total) into feeding tube 2 (two) times daily. 05/15/15   [name], MD  hydrALAZINE (APRESOLINE) 50 MG tablet Give 50 mg by tube 4 (four) times daily - after meals and at bedtime.     Historical Provider, MD  insulin glargine (LANTUS) 100 UNIT/ML injection Inject 0.05 mLs (5 Units total) into the skin at bedtime. 05/15/15   [name], MD  insulin lispro (HUMALOG) 100 UNIT/ML injection Inject 2-12 Units into the skin 4 (four) times daily. MAR has Insulin given 4 times daily with meals per patient 201-250=2units  251-300units=4units 301-350=6units  351-400=8units 401-450=10units >451=12 u    Historical Provider, MD  levofloxacin (LEVAQUIN) 500 MG tablet Take 1 tablet (500 mg total) by mouth every other day. For 3days 05/15/15   [name], MD  Multiple Vitamin (MULTIVITAMIN) LIQD 5 mLs by PEG Tube route daily.     Historical Provider, MD  pantoprazole sodium (PROTONIX) 40 mg/20 mL PACK Place 40 mg into feeding tube daily.    Historical Provider, MD  sertraline (ZOLOFT) 100 MG tablet Place 150 mg into feeding tube daily.  04/01/15   Historical Provider, MD  tamsulosin (FLOMAX) 0.4 MG CAPS capsule Take 0.4 mg by mouth.    Historical Provider, MD  traZODone (DESYREL) 50 MG tablet 25 mg by Feeding Tube route at bedtime.     Historical Provider, MD  Water For Irrigation, Sterile (FREE WATER) SOLN Place 300 mLs into feeding tube every 6 (six) hours. 01/07/15   [name], MD    Family History No family history on file.  Social History Social History  Substance Use Topics  . Smoking status: Never Smoker  . Smokeless tobacco: Not on file  . Alcohol use No     Allergies     Review of patient's allergies indicates no known allergies.   Review of Systems Review of Systems  Unable to perform ROS: Mental status change    Physical Exam Updated Vital Signs BP 138/74   Pulse 72   Temp 99.5 F (37.5 C) (Rectal)   Resp 21   SpO2 100%   Physical Exam  Constitutional: Vital signs are normal. He appears well-developed and well-nourished.  Non-toxic appearance. He does not appear ill. No distress.  HENT:  Head: Normocephalic and atraumatic.  Nose: Nose normal.  Mouth/Throat: Oropharynx is clear and moist. Mucous membranes are dry. No oropharyngeal exudate.  Eyes: Conjunctivae and EOM are normal. Pupils are equal, round, and reactive to light. No  scleral icterus.  Neck: Normal range of motion. Neck supple. No tracheal deviation, no edema, no erythema and normal range of motion present. No thyroid mass and no thyromegaly present.  Cardiovascular: Normal rate, regular rhythm, S1 normal, S2 normal, normal heart sounds, intact distal pulses and normal pulses.  Exam reveals no gallop and no friction rub.   No murmur heard. Pulmonary/Chest: Effort normal and breath sounds normal. No respiratory distress. He has no wheezes. He has no rhonchi. He has no rales.  Abdominal: Soft. Normal appearance. He exhibits no distension, no ascites and no mass. There is no hepatosplenomegaly. There is no tenderness. There is no rebound, no guarding and no CVA tenderness.  PEG tube to  LUQ Colostomy to  RLQ  Musculoskeletal: Normal range of motion. He exhibits no edema or tenderness.  Lymphadenopathy:    He has no cervical adenopathy.  Neurological: He is alert. He has normal strength. No sensory deficit.  Withdraws to painful stimuli  Skin: Skin is warm, dry and intact. No petechiae and no rash noted. He is not diaphoretic. No pallor.  Ulcerations noted to bilateral great toe  Nursing note and vitals reviewed.   ED Treatments / Results   DIAGNOSTIC STUDIES:  Oxygen Saturation  is 100% on [place], normal by my interpretation.    Labs (all labs ordered are listed, but only abnormal results are displayed) Labs Reviewed  COMPREHENSIVE METABOLIC PANEL - Abnormal; Notable for the following:       Result Value   Sodium 154 (*)    Chloride 125 (*)    CO2 20 (*)    Glucose, Bld 229 (*)    BUN 161 (*)    Creatinine, Ser 3.27 (*)    Calcium 11.1 (*)    Total Protein 8.7 (*)    Albumin 3.3 (*)    Alkaline Phosphatase 157 (*)    GFR calc non Af Amer 18 (*)    GFR calc Af Amer 21 (*)    All other components within normal limits  CBC WITH DIFFERENTIAL/PLATELET - Abnormal; Notable for the following:    WBC 13.4 (*)    Hemoglobin 12.0 (*)    MCH 25.1 (*)    RDW 18.5 (*)    Neutro Abs 11.4 (*)    All other components within normal limits  I-STAT TROPOININ, ED - Abnormal; Notable for the following:    Troponin i, poc 0.23 (*)    All other components within normal limits  I-STAT CHEM 8, ED - Abnormal; Notable for the following:    Sodium 160 (*)    Chloride 131 (*)    BUN >140 (*)    Creatinine, Ser 3.10 (*)    Glucose, Bld 229 (*)    Calcium, Ion 1.47 (*)    Hemoglobin 12.9 (*)    HCT 38.0 (*)    All other components within normal limits  CULTURE, BLOOD (ROUTINE X 2)  CULTURE, BLOOD (ROUTINE X 2)  URINE CULTURE  URINALYSIS, ROUTINE W REFLEX MICROSCOPIC (NOT AT [facility])  I-STAT CG4 LACTIC ACID, ED    EKG  EKG Interpretation  Date/Time:  Sunday August 17 2015 00:07:40 EDT Ventricular Rate:  76 PR Interval:    QRS Duration: 103 QT Interval:  430 QTC Calculation: 484 R Axis:   -62 Text Interpretation:  Accelerated junctional rhythm Left ventricular hypertrophy Anterior Q waves, possibly due to LVH Nonspecific T abnormalities, inferior leads junctional rhythm is new since last tracing Confirmed by Glynn Octave [phone]) on  08/17/2015 12:15:35 AM       Radiology Ct Head Wo Contrast  Result Date: 08/17/2015 CLINICAL DATA:  Altered mental status. History  of Alzheimer's dementia, diabetes, stroke, hyperlipidemia, hypertension. EXAM: CT HEAD WITHOUT CONTRAST TECHNIQUE: Contiguous axial images were obtained from the base of the skull through the vertex without intravenous contrast. COMPARISON:  CT HEAD October 29, 2007 FINDINGS: INTRACRANIAL CONTENTS: Moderate to severe ventriculomegaly on the basis of global parenchymal brain volume loss No intraparenchymal hemorrhage, mass effect nor midline shift. Patchy supratentorial white matter hypodensities are within normal range for patient's age and though non-specific likely represent chronic small vessel ischemic disease. 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 No acute large vascular territory infarcts. No abnormal extra-axial fluid collections. Basal cisterns are patent. Moderate calcific atherosclerosis of the carotid siphons. ORBITS: The included ocular globes and orbital contents are non-suspicious. SINUSES: Chronic LEFT sphenoid sinusitis with widened sphenoid ethmoidal recess. Mastoid air cells are well aerated. SKULL/SOFT TISSUES: No skull fracture. No significant soft tissue swelling. Old distal nasal bone fractures. IMPRESSION: No acute intracranial process. Moderate to severe global parenchymal brain volume loss for age. Mild chronic small vessel ischemic disease. Electronically Signed   By: Elon Alas M.D.   On: 08/17/2015 01:49   Dg Chest Port 1 View  Result Date: 08/17/2015 CLINICAL DATA:  Altered mental status. EXAM: PORTABLE CHEST 1 VIEW COMPARISON:  05/13/2015 FINDINGS: Mild decrease in cardiomegaly from prior. There is atherosclerosis of the thoracic aorta. Improvement in diffuse lung opacity from prior with minimal residual or recurrent patchy lower lobe airspace opacities. No evidence of pleural effusion or pneumothorax. Stable appearance of the osseous structures. IMPRESSION: Ill-defined patchy bibasilar opacities, may reflect atelectasis or scarring. Residual or recurrent pneumonia or  pulmonary edema are felt less likely. Electronically Signed   By: Jeb Levering M.D.   On: 08/17/2015 00:53    Procedures Procedures (including critical care time)  Medications Ordered in ED Medications  vancomycin (VANCOCIN) 1,500 mg in sodium chloride 0.9 % 500 mL IVPB (1,500 mg Intravenous New Bag/Given 08/17/15 0106)  sodium chloride 0.9 % bolus 1,000 mL (not administered)  piperacillin-tazobactam (ZOSYN) IVPB 3.375 g (3.375 g Intravenous New Bag/Given 08/17/15 0054)     Initial Impression / Assessment and Plan / ED Course  I have reviewed the triage vital signs and the nursing notes.  Pertinent labs & imaging results that were available during my care of the patient were reviewed by me and considered in my medical decision making (see chart for details).  Clinical Course    Patient presents to the emergency department for altered mental status. Code sepsis was called until proven otherwise. We'll also obtain CT scan of the head for evaluation of trauma.  2:18 AM labs shows AKI, hypernatremia.  CXR shows possible pneumonia, CT head is neg.  Patient given vanc and zosyn.  I spoke with Dr. Loleta Books who will admit for further care.  UA is still pending.   CRITICAL CARE Performed by: [name]   Total critical care time: 55 minutes - hypernatremia  Critical care time was exclusive of separately billable procedures and treating other patients.  Critical care was necessary to treat or prevent imminent or life-threatening deterioration.  Critical care was time spent personally by me on the following activities: development of treatment plan with patient and/or surrogate as well as nursing, discussions with consultants, evaluation of patient's response to treatment, examination of patient, obtaining history from patient or surrogate, ordering and performing treatments and interventions, ordering and  review of laboratory studies, ordering and review of radiographic studies, pulse  oximetry and re-evaluation of patient's condition.   Final Clinical Impressions(s) / ED Diagnoses   Final diagnoses:  None    New Prescriptions New Prescriptions   No medications on file     I personally performed the services described in this documentation, which was scribed in my presence. The recorded information has been reviewed and is accurate.       Derrick Balls, MD 08/17/15 [place], MD 08/17/15 [id number]

## 2015-08-17 NOTE — Progress Notes (Signed)
Pharmacy Antibiotic Note  [name] is a 70 y.o. male admitted on 08/24/2015 from NH with AMS, possible PNA/sepsis.  Pharmacy has been consulted for Vancomycin and Cefepime dosing.  Vancomycin 1.5 g IV given in ED at  0100  Plan: Vancomycin 1 g IV q48h Cefepime 1 g IV q24h  Height: 5\' 10"  (177.8 cm) Weight: 188 lb 11.4 oz (85.6 kg) IBW/kg (Calculated) : 73  Temp (24hrs), Avg:98.8 F (37.1 C), Min:98 F (36.7 C), Max:99.5 F (37.5 C)   Recent Labs Lab 08/17/15 0037 08/17/15 0051  WBC 13.4*  --   CREATININE 3.27* 3.10*  LATICACIDVEN  --  1.60    Estimated Creatinine Clearance: 22.9 mL/min (by C-G formula based on SCr of 3.1 mg/dL).    No Known Allergies  Caryl Pina 08/17/2015 4:35 AM

## 2015-08-17 NOTE — ED Notes (Signed)
Patient has colostomy in RLQ & peg tube in LUQ.

## 2015-08-17 NOTE — Progress Notes (Addendum)
[facility] PROGRESS NOTE    Progress Note  [name]  [id number] DOB: 06-Sep-1945 DOA: 09/10/2015 PCP: Wenda Low, MD     Brief Narrative:   [name] is an 70 y.o. male past medical history significant for dementia chronic diastolic heart failure and chronic kidney disease stage IV, status post colectomy due to colon cancer with a PEG tubecame in with acute encephalopathy hypernatremia, start her on IV vancomycin and cefepime.  Assessment/Plan:   Hypernatremia: Patient was on free water and Lasix on DC'd.  Continue check B-met every 4 hours, decrease D5 half-normal and use more per PEG tube free water. Check b-met every 4 hours will not decrease more than 12 and a 24-hour period. Patient and family have met with palliative care in the past, I think it's reasonable to remove all began. As the patient continues to have multiple readmissions and seems to be declining.  Acute encephalopathy superimposed on Alzheimer's disease: Place nothing by mouth likely due to hypernatremia.  Acute kidney injury on chronic kidney disease stage III: Baseline creatinine around 2-2.5. Cont IV fluid hydration.  SIRS criteria: Started on IV vancomycin and cefepime for unknown source. Culture data are pending. He was recently DC'd from the hospital for aspiration pneumonia.  Anemia in chronic renal disease: Due to chronic disease.  Type 2 diabetes mellitus ([place]) Started on sliding scale.  Chronic diastolic CHF (congestive heart failure) (HCC) D/c lasix.  Essential hypertension:  Bilateral great toes wounds: With the drainage catheter wound care consult Vanc and cefepime should cover.   DVT prophylaxis: lovenox Family Communication:none Disposition Plan/Barrier to D/C: unable to determine Code Status:     Code Status Orders        Start     Ordered   08/17/15 0434  Full code  Continuous     08/17/15 0433    Code Status History    Date Active Date  Inactive Code Status Order ID Comments User Context   05/12/2015  9:50 AM 05/15/2015  5:53 PM Full Code [id number]  Kelvin Cellar, MD Inpatient   01/02/2015  3:17 PM 01/07/2015  7:00 PM Full Code [id number]  Donne Hazel, MD ED   11/01/2014 12:21 AM 11/01/2014  6:47 PM Full Code [id number]  Reubin Milan, MD Inpatient   09/23/2014  4:53 PM 10/11/2014  5:29 PM Full Code [id number]  Carney Harder Inpatient   09/20/2014  9:02 AM 09/23/2014  4:53 PM Full Code [id number]  Javier Glazier, MD ED        IV Access:    Peripheral IV   Procedures and diagnostic studies:   Ct Head Wo Contrast  Result Date: 08/17/2015 CLINICAL DATA:  Altered mental status. History of Alzheimer's dementia, diabetes, stroke, hyperlipidemia, hypertension. EXAM: CT HEAD WITHOUT CONTRAST TECHNIQUE: Contiguous axial images were obtained from the base of the skull through the vertex without intravenous contrast. COMPARISON:  CT HEAD October 29, 2007 FINDINGS: INTRACRANIAL CONTENTS: Moderate to severe ventriculomegaly on the basis of global parenchymal brain volume loss No intraparenchymal hemorrhage, mass effect nor midline shift. Patchy supratentorial white matter hypodensities are within normal range for patient's age and though non-specific likely represent chronic small vessel ischemic disease. 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 No acute large vascular territory infarcts. No abnormal extra-axial fluid collections. Basal cisterns are patent. Moderate calcific atherosclerosis of the carotid siphons. ORBITS: The included ocular globes and orbital contents are non-suspicious. SINUSES: Chronic LEFT sphenoid sinusitis with widened sphenoid ethmoidal recess. Mastoid air cells are  well aerated. SKULL/SOFT TISSUES: No skull fracture. No significant soft tissue swelling. Old distal nasal bone fractures. IMPRESSION: No acute intracranial process. Moderate to severe global parenchymal brain volume loss for age. Mild chronic small vessel  ischemic disease. Electronically Signed   By: Elon Alas M.D.   On: 08/17/2015 01:49   Dg Chest Port 1 View  Result Date: 08/17/2015 CLINICAL DATA:  Altered mental status. EXAM: PORTABLE CHEST 1 VIEW COMPARISON:  05/13/2015 FINDINGS: Mild decrease in cardiomegaly from prior. There is atherosclerosis of the thoracic aorta. Improvement in diffuse lung opacity from prior with minimal residual or recurrent patchy lower lobe airspace opacities. No evidence of pleural effusion or pneumothorax. Stable appearance of the osseous structures. IMPRESSION: Ill-defined patchy bibasilar opacities, may reflect atelectasis or scarring. Residual or recurrent pneumonia or pulmonary edema are felt less likely. Electronically Signed   By: Jeb Levering M.D.   On: 08/17/2015 00:53     Medical Consultants:    None.  Anti-Infectives:   Vancomycin and cefepime  Subjective:    [name] unable to carry on a conversation moaning and groaning.  Objective:    Vitals:   08/17/15 0300 08/17/15 0315 08/17/15 0330 08/17/15 0425  BP: 138/79 142/73 132/76 134/80  Pulse: 82 83 83 81  Resp: '18 21 20 20  '$ Temp:    98 F (36.7 C)  TempSrc:    Oral  SpO2: 100% 95% 98% 100%  Weight:    85.6 kg (188 lb 11.4 oz)  Height:    '5\' 10"'$  (1.778 m)    Intake/Output Summary (Last 24 hours) at 08/17/15 0709 Last data filed at 08/17/15 0345  Gross per 24 hour  Intake             1550 ml  Output              100 ml  Net             1450 ml   Filed Weights   08/17/15 0425  Weight: 85.6 kg (188 lb 11.4 oz)    Exam: General exam: In no acute distress. Respiratory system: Good air movement and clear to auscultation. Cardiovascular system: S1 & S2 heard, RRR. Gastrointestinal system: Abdomen is nondistended, soft and nontender.  Central nervous system: Alert and oriented. No focal neurological deficits. Extremities: No pedal edema. Skin: No rashes, lesions or ulcers   Data Reviewed:    Labs: Basic  Metabolic Panel:  Recent Labs Lab 08/17/15 0037 08/17/15 0051 08/17/15 0439  NA 154* 160* 156*  K 4.9 5.0 5.0  CL 125* 131* 125*  CO2 20*  --  18*  GLUCOSE 229* 229* 213*  BUN 161* >140* 160*  CREATININE 3.27* 3.10* 3.07*  CALCIUM 11.1*  --  10.3   GFR Estimated Creatinine Clearance: 23.1 mL/min (by C-G formula based on SCr of 3.07 mg/dL). Liver Function Tests:  Recent Labs Lab 08/17/15 0037  AST 40  ALT 57  ALKPHOS 157*  BILITOT 0.4  PROT 8.7*  ALBUMIN 3.3*   No results for input(s): LIPASE, AMYLASE in the last 168 hours. No results for input(s): AMMONIA in the last 168 hours. Coagulation profile No results for input(s): INR, PROTIME in the last 168 hours.  CBC:  Recent Labs Lab 08/17/15 0037 08/17/15 0051 08/17/15 0439  WBC 13.4*  --  12.9*  NEUTROABS 11.4*  --   --   HGB 12.0* 12.9* 11.3*  HCT 39.5 38.0* 37.1*  MCV 82.5  --  82.6  PLT 166  --  128*   Cardiac Enzymes: No results for input(s): CKTOTAL, CKMB, CKMBINDEX, TROPONINI in the last 168 hours. BNP (last 3 results) No results for input(s): PROBNP in the last 8760 hours. CBG:  Recent Labs Lab 08/17/15 0608  GLUCAP 234*   D-Dimer: No results for input(s): DDIMER in the last 72 hours. Hgb A1c: No results for input(s): HGBA1C in the last 72 hours. Lipid Profile: No results for input(s): CHOL, HDL, LDLCALC, TRIG, CHOLHDL, LDLDIRECT in the last 72 hours. Thyroid function studies: No results for input(s): TSH, T4TOTAL, T3FREE, THYROIDAB in the last 72 hours.  Invalid input(s): FREET3 Anemia work up: No results for input(s): VITAMINB12, FOLATE, FERRITIN, TIBC, IRON, RETICCTPCT in the last 72 hours. Sepsis Labs:  Recent Labs Lab 08/17/15 0037 08/17/15 0051 08/17/15 0439  WBC 13.4*  --  12.9*  LATICACIDVEN  --  1.60  --    Microbiology Recent Results (from the past 240 hour(s))  MRSA PCR Screening     Status: Abnormal   Collection Time: 08/17/15  5:18 AM  Result Value Ref Range  Status   MRSA by PCR POSITIVE (A) NEGATIVE Final    Comment:        The GeneXpert MRSA Assay (FDA approved for NASAL specimens only), is one component of a comprehensive MRSA colonization surveillance program. It is not intended to diagnose MRSA infection nor to guide or monitor treatment for MRSA infections. RESULT CALLED TO, READ BACK BY AND VERIFIED WITH: GARDNER,D RN 2025 08/17/15 MITCHELL,L      Medications:   . amLODipine  10 mg Per Tube Daily  . aspirin  81 mg Oral Daily  . ceFEPime (MAXIPIME) IV  1 g Intravenous Q24H  . enoxaparin (LOVENOX) injection  30 mg Subcutaneous Q24H  . ferrous sulfate  300 mg Per Tube Daily  . free water  300 mL Per Tube Q6H  . hydrALAZINE  50 mg Per Tube Q6H  . insulin glargine  5 Units Subcutaneous QHS  . lactose free nutrition  1 Container Oral TID WC  . pantoprazole sodium  40 mg Per Tube Daily  . sertraline  150 mg Per Tube Daily  . tamsulosin  0.4 mg Oral QPC supper  . [START ON 08/19/2015] vancomycin  1,000 mg Intravenous Q48H   Continuous Infusions: . dextrose 150 mL/hr at 08/17/15 0505    Time spent: 25 min   LOS: 0 days   Charlynne Cousins  [facility] Pager [phone]  *Please refer to [email or web address], password [email or web address] to get updated schedule on who will round on this patient, as hospitalists switch teams weekly. If 7PM-7AM, please contact night-coverage at [email or web address], password [email or web address] for any overnight needs.  08/17/2015, 7:09 AM

## 2015-08-17 NOTE — Significant Event (Signed)
Rapid Response Event Note  Overview: Time Called: 1230 Arrival Time: 1230 Event Type: Other (Comment)  Initial Focused Assessment: Reassessed patient, exam unchanged from earlier, VS stable  Interventions: none  Plan of Care (if not transferred): support RN staff, continue monitoring patient  Event Summary:  Name of Physician Notified: Dr. Aileen Fass at 1015    at    Outcome: Stayed in room and stabalized  Event End Time: [place], [place]

## 2015-08-17 NOTE — Significant Event (Signed)
Rapid Response Event Note  Overview: Time Called: [id number] Arrival Time: 1000 Event Type: Other (Comment), Neurologic, Respiratory  Initial Focused Assessment: Called by charge RN to assess patient.  Patient admitted for acute encephalopathy hypernatremia. PMHX includes, Chronic DHF, CKD IV, s/p colectomy and PEG placement.  Upon assessment, patient is obtunded, did wake wake to painful stimuli, once awake, patient was able to track across the room, patient did not follow commands but was spontaneously moving all extremities.  Lungs sounds were diminish/rhonchi, patient was not able to cough any secretions up, patient was deep suction and thick secretions were present.  Patient has history of aspirating and recently treated for aspiration PNA. + pulses in all extremities, colostomy and PEG sites looked intact, patient making urine. + diabetic ulcers present.  Overall patient is deconditioned.  Hemodynamically VS are stable and patient had been started on ABX.  Interventions: - Spoke with Dr. Venetia Constable about the status of the patient, patient is tenuious but his overall condition seems to deconditioned.  We discussed that a palliative/GOCs discussion could be an option if the family wishes. - Patient is stable, will follow up with nurses - advised RNs to keep [facility] elevated and will f/u with RT about chest PT  Plan of Care (if not transferred): patient remained in room, stable  Event Summary: Name of Physician Notified: Dr. Aileen Fass at 1015    at    Outcome: Stayed in room and stabalized  Event End Time: [place], [place]

## 2015-08-18 ENCOUNTER — Encounter (HOSPITAL_COMMUNITY): Payer: Self-pay | Admitting: Physician Assistant

## 2015-08-18 DIAGNOSIS — Z515 Encounter for palliative care: Secondary | ICD-10-CM

## 2015-08-18 DIAGNOSIS — Z7189 Other specified counseling: Secondary | ICD-10-CM

## 2015-08-18 DIAGNOSIS — F0281 Dementia in other diseases classified elsewhere with behavioral disturbance: Secondary | ICD-10-CM

## 2015-08-18 DIAGNOSIS — G301 Alzheimer's disease with late onset: Secondary | ICD-10-CM

## 2015-08-18 LAB — BASIC METABOLIC PANEL
ANION GAP: 7 (ref 5–15)
ANION GAP: 7 (ref 5–15)
Anion gap: 3 — ABNORMAL LOW (ref 5–15)
Anion gap: 7 (ref 5–15)
Anion gap: 9 (ref 5–15)
BUN: 129 mg/dL — ABNORMAL HIGH (ref 6–20)
BUN: 127 mg/dL — ABNORMAL HIGH (ref 6–20)
BUN: 130 mg/dL — AB (ref 6–20)
BUN: 133 mg/dL — AB (ref 6–20)
BUN: 123 mg/dL — ABNORMAL HIGH (ref 6–20)
CALCIUM: 10 mg/dL (ref 8.9–10.3)
CALCIUM: 9.9 mg/dL (ref 8.9–10.3)
CHLORIDE: 122 mmol/L — AB (ref 101–111)
CHLORIDE: 122 mmol/L — AB (ref 101–111)
CHLORIDE: 123 mmol/L — AB (ref 101–111)
CO2: 19 mmol/L — AB (ref 22–32)
CO2: 20 mmol/L — AB (ref 22–32)
CO2: 22 mmol/L (ref 22–32)
CO2: 23 mmol/L (ref 22–32)
CO2: 18 mmol/L — AB (ref 22–32)
CREATININE: 2.84 mg/dL — AB (ref 0.61–1.24)
CREATININE: 2.92 mg/dL — AB (ref 0.61–1.24)
CREATININE: 2.94 mg/dL — AB (ref 0.61–1.24)
CREATININE: 2.84 mg/dL — AB (ref 0.61–1.24)
Calcium: 10 mg/dL (ref 8.9–10.3)
Calcium: 10.1 mg/dL (ref 8.9–10.3)
Calcium: 10.4 mg/dL — ABNORMAL HIGH (ref 8.9–10.3)
Chloride: 124 mmol/L — ABNORMAL HIGH (ref 101–111)
Chloride: 123 mmol/L — ABNORMAL HIGH (ref 101–111)
Creatinine, Ser: 2.82 mg/dL — ABNORMAL HIGH (ref 0.61–1.24)
GFR calc Af Amer: 23 mL/min — ABNORMAL LOW (ref >60)
GFR calc non Af Amer: 20 mL/min — ABNORMAL LOW (ref >60)
GFR calc non Af Amer: 20 mL/min — ABNORMAL LOW (ref >60)
GFR calc non Af Amer: 21 mL/min — ABNORMAL LOW (ref >60)
GFR, EST AFRICAN AMERICAN: 24 mL/min — AB (ref >60)
GFR, EST AFRICAN AMERICAN: 24 mL/min — AB (ref >60)
GFR, EST AFRICAN AMERICAN: 25 mL/min — AB (ref >60)
GFR, EST AFRICAN AMERICAN: 24 mL/min — AB (ref >60)
GFR, EST NON AFRICAN AMERICAN: 21 mL/min — AB (ref >60)
GFR, EST NON AFRICAN AMERICAN: 21 mL/min — AB (ref >60)
Glucose, Bld: 109 mg/dL — ABNORMAL HIGH (ref 65–99)
Glucose, Bld: 113 mg/dL — ABNORMAL HIGH (ref 65–99)
Glucose, Bld: 133 mg/dL — ABNORMAL HIGH (ref 65–99)
Glucose, Bld: 138 mg/dL — ABNORMAL HIGH (ref 65–99)
Glucose, Bld: 161 mg/dL — ABNORMAL HIGH (ref 65–99)
POTASSIUM: 4.2 mmol/L (ref 3.5–5.1)
Potassium: 4.2 mmol/L (ref 3.5–5.1)
Potassium: 4.2 mmol/L (ref 3.5–5.1)
Potassium: 4.6 mmol/L (ref 3.5–5.1)
Potassium: 4.7 mmol/L (ref 3.5–5.1)
SODIUM: 151 mmol/L — AB (ref 135–145)
SODIUM: 149 mmol/L — AB (ref 135–145)
Sodium: 147 mmol/L — ABNORMAL HIGH (ref 135–145)
Sodium: 150 mmol/L — ABNORMAL HIGH (ref 135–145)
Sodium: 152 mmol/L — ABNORMAL HIGH (ref 135–145)

## 2015-08-18 LAB — GLUCOSE, CAPILLARY
GLUCOSE-CAPILLARY: 102 mg/dL — AB (ref 65–99)
GLUCOSE-CAPILLARY: 129 mg/dL — AB (ref 65–99)
GLUCOSE-CAPILLARY: 125 mg/dL — AB (ref 65–99)
Glucose-Capillary: 111 mg/dL — ABNORMAL HIGH (ref 65–99)
Glucose-Capillary: 138 mg/dL — ABNORMAL HIGH (ref 65–99)

## 2015-08-18 MED ORDER — CHLORHEXIDINE GLUCONATE CLOTH 2 % EX PADS
6.0000 | MEDICATED_PAD | Freq: Every day | CUTANEOUS | Status: DC
  Administered 2015-08-18 – 2015-08-21 (×4): 6 via TOPICAL

## 2015-08-18 MED ORDER — MUPIROCIN CALCIUM 2 % EX CREA
TOPICAL_CREAM | Freq: Every day | CUTANEOUS | Status: DC
  Administered 2015-08-18 – 2015-08-19 (×2): via TOPICAL
  Filled 2015-08-18: qty 15

## 2015-08-18 MED ORDER — FREE WATER
200.0000 mL | Status: DC
  Administered 2015-08-18 – 2015-08-19 (×9): 200 mL

## 2015-08-18 MED ORDER — MUPIROCIN 2 % EX OINT
1.0000 "application " | TOPICAL_OINTMENT | Freq: Two times a day (BID) | CUTANEOUS | Status: DC
  Administered 2015-08-18 – 2015-08-21 (×6): 1 via NASAL
  Filled 2015-08-18: qty 22

## 2015-08-18 NOTE — Progress Notes (Signed)
[facility] PROGRESS NOTE    Progress Note  [name]  [id number] DOB: [birth year] DOA: 09/02/2015 PCP: Wenda Low, MD     Brief Narrative:   [name] is an 70 y.o. male from [facility] with significant for dementia, chronic diastolic heart failure and chronic kidney disease stage IV, status post colectomy due to colon cancer with a PEG tube who presented with acute encephalopathy and hypernatremia. He had a low grade fever (99),  leukocytosis and a h/o recent aspiration and therefore he was started on  IV vancomycin and cefepime.  Assessment/Plan:   Hypernatremia: - was taking Lasix which has been discontinued -sodium still quite elevated - increase PEG tube free water from Q 8 to Q4 for today and cont to follow Bmet Q 6 hrs  Acute encephalopathy superimposed on Alzheimer's disease: - due to above? Follow mental status  SIRS - UA is positive- U culture growing gr neg rods -  no definite infiltrate on CXR but has h/o aspiration -  On Vanc/Zosyn- cont to follow cultures and narrow  Acute kidney injury on chronic kidney disease stage IV -family has already been told that he is not a dialysis candidate - Baseline creatinine around 2-2.5.   Anemia in chronic renal disease: Due to chronic disease.  Type 2 diabetes mellitus (HCC) - Insulin sliding scale.  Chronic diastolic CHF (congestive heart failure) (HCC) D/c lasix.  Essential hypertension:  Bilateral great toes wounds: With the drainage catheter wound care consult Vanc and cefepime should cover.   DVT prophylaxis: lovenox Family Communication:none Disposition Plan/Barrier to D/C: unable to determine Code Status:     Code Status Orders        Start     Ordered   08/17/15 0434  Full code  Continuous     08/17/15 0433    Code Status History    Date Active Date Inactive Code Status Order ID Comments User Context   05/12/2015  9:50 AM 05/15/2015  5:53 PM Full Code [id number]  Kelvin Cellar, MD Inpatient   01/02/2015  3:17 PM 01/07/2015  7:00 PM Full Code [id number]  Donne Hazel, MD ED   11/01/2014 12:21 AM 11/01/2014  6:47 PM Full Code [id number]  Reubin Milan, MD Inpatient   09/23/2014  4:53 PM 10/11/2014  5:29 PM Full Code [id number]  Carney Harder Inpatient   09/20/2014  9:02 AM 09/23/2014  4:53 PM Full Code [id number]  Javier Glazier, MD ED        IV Access:    Peripheral IV   Procedures and diagnostic studies:   Ct Head Wo Contrast  Result Date: 08/17/2015 CLINICAL DATA:  Altered mental status. History of Alzheimer's dementia, diabetes, stroke, hyperlipidemia, hypertension. EXAM: CT HEAD WITHOUT CONTRAST TECHNIQUE: Contiguous axial images were obtained from the base of the skull through the vertex without intravenous contrast. COMPARISON:  CT HEAD October 29, 2007 FINDINGS: INTRACRANIAL CONTENTS: Moderate to severe ventriculomegaly on the basis of global parenchymal brain volume loss No intraparenchymal hemorrhage, mass effect nor midline shift. Patchy supratentorial white matter hypodensities are within normal range for patient's age and though non-specific likely represent chronic small vessel ischemic disease. 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 No acute large vascular territory infarcts. No abnormal extra-axial fluid collections. Basal cisterns are patent. Moderate calcific atherosclerosis of the carotid siphons. ORBITS: The included ocular globes and orbital contents are non-suspicious. SINUSES: Chronic LEFT sphenoid sinusitis with widened sphenoid ethmoidal recess. Mastoid air cells are well aerated. SKULL/SOFT  TISSUES: No skull fracture. No significant soft tissue swelling. Old distal nasal bone fractures. IMPRESSION: No acute intracranial process. Moderate to severe global parenchymal brain volume loss for age. Mild chronic small vessel ischemic disease. Electronically Signed   By: Elon Alas M.D.   On: 08/17/2015 01:49   Dg Chest Port 1  View  Result Date: 08/17/2015 CLINICAL DATA:  Altered mental status. EXAM: PORTABLE CHEST 1 VIEW COMPARISON:  05/13/2015 FINDINGS: Mild decrease in cardiomegaly from prior. There is atherosclerosis of the thoracic aorta. Improvement in diffuse lung opacity from prior with minimal residual or recurrent patchy lower lobe airspace opacities. No evidence of pleural effusion or pneumothorax. Stable appearance of the osseous structures. IMPRESSION: Ill-defined patchy bibasilar opacities, may reflect atelectasis or scarring. Residual or recurrent pneumonia or pulmonary edema are felt less likely. Electronically Signed   By: Jeb Levering M.D.   On: 08/17/2015 00:53     Medical Consultants:    None.  Anti-Infectives:   Vancomycin and cefepime  Subjective:    [name] is non-verbal.   Objective:    Vitals:   08/17/15 1240 08/17/15 1430 08/17/15 2040 08/18/15 0441  BP: 123/70 (!) 133/59 130/62 127/68  Pulse:  77 81 76  Resp:  18 18 20   Temp:  97.9 F (36.6 C) 97.8 F (36.6 C) 98.3 F (36.8 C)  TempSrc:  Oral Oral Axillary  SpO2:  100% 100% 100%  Weight:    87.3 kg (192 lb 7.4 oz)  Height:        Intake/Output Summary (Last 24 hours) at 08/18/15 1036 Last data filed at 08/18/15 0831  Gross per 24 hour  Intake                0 ml  Output             2500 ml  Net            -2500 ml   Filed Weights   08/17/15 0425 08/18/15 0441  Weight: 85.6 kg (188 lb 11.4 oz) 87.3 kg (192 lb 7.4 oz)    Exam: General exam: alert - In no acute distress. Respiratory system: Good air movement and clear to auscultation. Cardiovascular system: S1 & S2 heard, RRR. Gastrointestinal system: Abdomen is nondistended, soft and non tender- Colostomy and PEG intact Central nervous system: Alert - does not follow commands- does not speak- No focal neurological deficits. Extremities: No pedal edema. Skin: No rashes, lesions or ulcers   Data Reviewed:    Labs: Basic Metabolic  Panel:  Recent Labs Lab 08/17/15 1135 08/17/15 2000 08/18/15 0012 08/18/15 0502 08/18/15 0801  NA 147* 149* 147* 152* 150*  K 4.3 4.4 4.2 4.6 4.2  CL 121* 123* 122* 122* 123*  CO2 19* 19* 22 23 18*  GLUCOSE 393* 256* 161* 113* 109*  BUN 142* 136* 133* 130* 129*  CREATININE 2.99* 2.98* 2.94* 2.92* 2.84*  CALCIUM 9.6 10.1 10.1 10.4* 10.0   GFR Estimated Creatinine Clearance: 25 mL/min (by C-G formula based on SCr of 2.84 mg/dL). Liver Function Tests:  Recent Labs Lab 08/17/15 0037  AST 40  ALT 57  ALKPHOS 157*  BILITOT 0.4  PROT 8.7*  ALBUMIN 3.3*   No results for input(s): LIPASE, AMYLASE in the last 168 hours. No results for input(s): AMMONIA in the last 168 hours. Coagulation profile No results for input(s): INR, PROTIME in the last 168 hours.  CBC:  Recent Labs Lab 08/17/15 0037 08/17/15 0051 08/17/15 0439  WBC  13.4*  --  12.9*  NEUTROABS 11.4*  --   --   HGB 12.0* 12.9* 11.3*  HCT 39.5 38.0* 37.1*  MCV 82.5  --  82.6  PLT 166  --  128*   Cardiac Enzymes: No results for input(s): CKTOTAL, CKMB, CKMBINDEX, TROPONINI in the last 168 hours. BNP (last 3 results) No results for input(s): PROBNP in the last 8760 hours. CBG:  Recent Labs Lab 08/17/15 0608 08/17/15 1119 08/17/15 1606 08/18/15 0440 08/18/15 0736  GLUCAP 234* 329* 305* 111* 102*   D-Dimer: No results for input(s): DDIMER in the last 72 hours. Hgb A1c: No results for input(s): HGBA1C in the last 72 hours. Lipid Profile: No results for input(s): CHOL, HDL, LDLCALC, TRIG, CHOLHDL, LDLDIRECT in the last 72 hours. Thyroid function studies: No results for input(s): TSH, T4TOTAL, T3FREE, THYROIDAB in the last 72 hours.  Invalid input(s): FREET3 Anemia work up: No results for input(s): VITAMINB12, FOLATE, FERRITIN, TIBC, IRON, RETICCTPCT in the last 72 hours. Sepsis Labs:  Recent Labs Lab 08/17/15 0037 08/17/15 0051 08/17/15 0439  WBC 13.4*  --  12.9*  LATICACIDVEN  --  1.60  --     Microbiology Recent Results (from the past 240 hour(s))  Blood Culture (routine x 2)     Status: None (Preliminary result)   Collection Time: 08/17/15 12:25 AM  Result Value Ref Range Status   Specimen Description BLOOD LEFT ARM  Final   Special Requests BOTTLES DRAWN AEROBIC AND ANAEROBIC 5CC   Final   Culture NO GROWTH < 24 HOURS  Final   Report Status PENDING  Incomplete  Blood Culture (routine x 2)     Status: None (Preliminary result)   Collection Time: 08/17/15 12:37 AM  Result Value Ref Range Status   Specimen Description BLOOD RIGHT HAND  Final   Special Requests BOTTLES DRAWN AEROBIC AND ANAEROBIC 5CC   Final   Culture NO GROWTH < 24 HOURS  Final   Report Status PENDING  Incomplete  MRSA PCR Screening     Status: Abnormal   Collection Time: 08/17/15  5:18 AM  Result Value Ref Range Status   MRSA by PCR POSITIVE (A) NEGATIVE Final    Comment:        The GeneXpert MRSA Assay (FDA approved for NASAL specimens only), is one component of a comprehensive MRSA colonization surveillance program. It is not intended to diagnose MRSA infection nor to guide or monitor treatment for MRSA infections. RESULT CALLED TO, READ BACK BY AND VERIFIED WITH: GARDNER,D RN [id number] 08/17/15 MITCHELL,L      Medications:   . amLODipine  10 mg Per Tube Daily  . aspirin  81 mg Oral Daily  . ceFEPime (MAXIPIME) IV  1 g Intravenous Q24H  . Chlorhexidine Gluconate Cloth  6 each Topical Q0600  . enoxaparin (LOVENOX) injection  30 mg Subcutaneous Q24H  . ferrous sulfate  300 mg Per Tube Daily  . free water  200 mL Per Tube Q4H  . hydrALAZINE  50 mg Per Tube Q6H  . insulin aspart  0-9 Units Subcutaneous Q4H  . insulin glargine  5 Units Subcutaneous QHS  . lactose free nutrition  1 Container Oral TID WC  . mupirocin cream   Topical Daily  . mupirocin ointment  1 application Nasal BID  . pantoprazole sodium  40 mg Per Tube Daily  . sertraline  150 mg Per Tube Daily  . tamsulosin  0.4 mg Oral  QPC supper  . [START ON 08/19/2015]  vancomycin  1,000 mg Intravenous Q48H   Continuous Infusions: . dextrose 10 mL/hr at 08/17/15 1508    Time spent: 25 min   LOS: 1 day   Jeramine Delis, MD  [facility] Pager- Please refer to [email or web address], password [email or web address] to get schedule and pager numbers. If 7PM-7AM, please contact night-coverage at [email or web address], password [email or web address] for any overnight needs.  08/18/2015, 10:36 AM

## 2015-08-18 NOTE — Consult Note (Signed)
Consultation Note Date: 08/18/2015   Patient Name: [name]  DOB: 01-14-1945  MRN: [id number]  Age / Sex: 70 y.o., male  PCP: Wenda Low, MD Referring Physician: Debbe Odea, MD  Reason for Consultation: Establishing goals of care  HPI/Patient Profile: 70 y.o. male  with past medical history of  admitted on 08/28/2015 with Alzheimers dementia, dysphagia with recurrent aspiration,   Clinical Assessment and Goals of Care: I spoke with the patient's sister [place] on the phone as we could not coordinate a time to meet in person today or tomorrow.  Her family has been thru a great deal with multiple funerals recently.  [name] tells me that 2 weeks ago the patient was in his wheel chair at [facility] and doing reasonably well.  In the last two weeks she states he has declined significantly.    We spoke about [name] overall condition and the progression of his Alzheimers dementia.    We discussed code status.  I recommended that the family change his code status to DNR or Allow Natural Death as going thru a code would only cause a more traumatic death - given his current health status.  We also discussed leaving the hospital and going to residential hospice rather than SNF.  [name] listened and stated that should would like to discuss both issues with her family - she did not want to make the decision alone.   She stated she would start reaching out to her siblings post haste about [name].  NEXT OF KIN:  The patient's sibling are his decision makers    SUMMARY OF RECOMMENDATIONS   Full code for now  - Family discussing a change to DNR Continue full treatment for now.  Family considering change to comfort care and residential hospice.   Symptom Management:   Per primary team  Palliative Prophylaxis:   Aspiration, Bowel Regimen, Delirium Protocol, Frequent Pain Assessment and  Palliative Wound Care  Prognosis:   < 2 weeks given altered mental status, recurrent aspiration, wounds, kidney failure.  Discharge Planning: To Be Determined:  But likely residential hospice      Primary Diagnoses: Present on Admission: . Hypernatremia . Essential hypertension . Anemia in chronic renal disease . Alzheimer's disease . Chronic diastolic CHF (congestive heart failure) ([place]) . Acute-on-chronic kidney injury ([place])   I have reviewed the medical record, interviewed the patient and family, and examined the patient. The following aspects are pertinent.  Past Medical History:  Diagnosis Date  . A-fib ([place])   . Alzheimer's dementia   . Anemia   . CHF (congestive heart failure) ([place])   . Colon cancer ([place]) 2009   s/p Colectomy  . Delusional disorder ([place])   . Dementia   . Depression   . Diabetes mellitus without complication ([place])   . Dysphagia   . Falls frequently   . HOH (hard of hearing)   . Hyperlipidemia   . Hypertension   . Hypokalemia   . Hyponatremia   . Renal  insufficiency   . Stroke ([place])   . UTI (lower urinary tract infection)    Social History   Social History  . Marital status: Single    Spouse name: N/A  . Number of children: N/A  . Years of education: N/A   Social History Main Topics  . Smoking status: Never Smoker  . Smokeless tobacco: Never Used  . Alcohol use No  . Drug use: No  . Sexual activity: Not Currently   Other Topics Concern  . None   Social History Narrative  . None   History reviewed. No pertinent family history. Scheduled Meds: . amLODipine  10 mg Per Tube Daily  . aspirin  81 mg Oral Daily  . ceFEPime (MAXIPIME) IV  1 g Intravenous Q24H  . Chlorhexidine Gluconate Cloth  6 each Topical Q0600  . enoxaparin (LOVENOX) injection  30 mg Subcutaneous Q24H  . ferrous sulfate  300 mg Per Tube Daily  . free water  200 mL Per Tube Q4H  . hydrALAZINE  50 mg Per Tube Q6H  . insulin aspart  0-9 Units Subcutaneous Q4H    . insulin glargine  5 Units Subcutaneous QHS  . lactose free nutrition  1 Container Oral TID WC  . mupirocin cream   Topical Daily  . mupirocin ointment  1 application Nasal BID  . pantoprazole sodium  40 mg Per Tube Daily  . sertraline  150 mg Per Tube Daily  . tamsulosin  0.4 mg Oral QPC supper  . [START ON 08/19/2015] vancomycin  1,000 mg Intravenous Q48H   Continuous Infusions: . dextrose 10 mL/hr at 08/17/15 1508   PRN Meds:.acetaminophen **OR** acetaminophen Medications Prior to Admission:  Prior to Admission medications   Medication Sig Start Date End Date Taking? Authorizing Provider  albuterol (PROVENTIL) (2.5 MG/3ML) 0.083% nebulizer solution Take 2.5 mg by nebulization every 6 (six) hours as needed for wheezing or shortness of breath.   Yes Historical Provider, MD  Amino Acids-Protein Hydrolys (FEEDING SUPPLEMENT, PRO-STAT SUGAR FREE 64,) LIQD Place 30 mLs into feeding tube daily.   Yes Historical Provider, MD  amLODipine (NORVASC) 10 MG tablet Place 10 mg into feeding tube daily. 04/23/15  Yes Historical Provider, MD  antiseptic oral rinse (BIOTENE) LIQD 15 mLs by Mouth Rinse route 2 (two) times daily.   Yes Historical Provider, MD  ascorbic acid (VITAMIN C) 1000 MG tablet Place 1,000 mg into feeding tube 2 (two) times daily.    Yes Historical Provider, MD  aspirin 81 MG chewable tablet Place 81 mg into feeding tube daily.    Yes Historical Provider, MD  cyanocobalamin (,VITAMIN B-12,) 1000 MCG/ML injection Inject 1,000 mcg into the muscle every 30 (thirty) days. On the 12th of each month   Yes Historical Provider, MD  ferrous sulfate 220 (44 FE) MG/5ML solution Place 330 mg into feeding tube daily.   Yes Historical Provider, MD  furosemide (LASIX) 40 MG tablet Place 1 tablet (40 mg total) into feeding tube 2 (two) times daily. 05/15/15  Yes [name], MD  hydrALAZINE (APRESOLINE) 50 MG tablet Place 50 mg into feeding tube 4 (four) times daily - after meals and at bedtime.     Yes Historical Provider, MD  insulin glargine (LANTUS) 100 UNIT/ML injection Inject 0.05 mLs (5 Units total) into the skin at bedtime. 05/15/15  Yes [name], MD  insulin lispro (HUMALOG) 100 UNIT/ML injection Inject 2-12 Units into the skin See admin instructions. Inject 2-12 units subcutaneously before meals and at bedtime  per sliding scale: CBG 201-250 2 units, 251-300 4 units, 301-350 6 units, 351-400 8 units, 401-450 10 units, >451 12 units.   Yes Historical Provider, MD  Multiple Vitamins-Minerals (CERTAVITE/ANTIOXIDANTS) LIQD Place 5 mLs into feeding tube daily.   Yes Historical Provider, MD  Nutritional Supplements (NUTREN 2.0) LIQD Place into feeding tube See admin instructions. 85 ml/hr for 12 hours - on at 7pm, off at 7am   Yes Historical Provider, MD  pantoprazole sodium (PROTONIX) 40 mg/20 mL PACK Place 40 mg into feeding tube daily before breakfast. Granules are not to be crushed or chewed and should be given immediately after mixing   Yes Historical Provider, MD  sertraline (ZOLOFT) 50 MG tablet Take 150 mg by mouth daily.   Yes Historical Provider, MD  tamsulosin (FLOMAX) 0.4 MG CAPS capsule Take 0.4 mg by mouth daily. Do not crush, chew or open capsules   Yes Historical Provider, MD  traZODone (DESYREL) 50 MG tablet Place 25 mg into feeding tube at bedtime.    Yes Historical Provider, MD  Water For Irrigation, Sterile (FREE WATER) SOLN Place 300 mLs into feeding tube every 6 (six) hours. Patient taking differently: Place 150 mLs into feeding tube every 4 (four) hours.  01/07/15  Yes [name], MD   No Known Allergies Review of Systems:  Patient Unable.  Physical Exam  Well developed man.  Awake, calm slow to respond. CV irreg Resp difficult to hear  Abdomen firm, PEG in place Ext lower ext edema 1+, bilateral great toe wounds bandaged.  Vital Signs: BP (!) 129/58   Pulse 76   Temp 98.3 F (36.8 C) (Axillary)   Resp 20   Ht 5\' 10"  (1.778 m)   Wt 87.3 kg (192 lb  7.4 oz)   SpO2 100%   BMI 27.62 kg/m  Pain Assessment: Faces   Pain Score: Asleep   SpO2: SpO2: 100 % O2 Device:SpO2: 100 % O2 Flow Rate: .   IO: Intake/output summary:   Intake/Output Summary (Last 24 hours) at 08/18/15 1148 Last data filed at 08/18/15 0831  Gross per 24 hour  Intake                0 ml  Output             2500 ml  Net            -2500 ml    LBM: Last BM Date: 08/18/15 Baseline Weight: Weight: 85.6 kg (188 lb 11.4 oz) Most recent weight: Weight: 87.3 kg (192 lb 7.4 oz)     Palliative Assessment/Data:   Flowsheet Rows   Flowsheet Row Most Recent Value  Intake Tab  Referral Department  Hospitalist  Unit at Time of Referral  Cardiac/Telemetry Unit  Palliative Care Primary Diagnosis  Other (Comment)  Date Notified  08/17/15  Palliative Care Type  New Palliative care  Reason for referral  Clarify Goals of Care  Date of Admission  08/29/2015  Date first seen by Palliative Care  08/18/15  # of days Palliative referral response time  1 Day(s)  # of days IP prior to Palliative referral  1  Clinical Assessment  Palliative Performance Scale Score  30%  Psychosocial & Spiritual Assessment  Palliative Care Outcomes  Patient/Family meeting held?  Yes  Who was at the meeting?  [place] on the phone      Time In: 10:30 Time Out: 11:40 Time Total: 70 Greater than 50%  of this time was spent counseling and  coordinating care related to the above assessment and plan.  Signed by: Imogene Burn, PA-C Palliative Medicine Pager: [phone]   Please contact Palliative Medicine Team phone at [phone] for questions and concerns.  For individual provider: See Shea Evans

## 2015-08-18 NOTE — Progress Notes (Signed)
Spoke with Verita Lamb (sister) via telephone. Family agrees to DNR/DNI. Will discuss further hospice options with her in AM.   NO CHARGE  Ihor Dow, NP

## 2015-08-18 NOTE — Progress Notes (Signed)
Inpatient Diabetes Program Recommendations  AACE/ADA: New Consensus Statement on Inpatient Glycemic Control (2015)  Target Ranges:  Prepandial:   less than 140 mg/dL      Peak postprandial:   less than 180 mg/dL (1-2 hours)      Critically ill patients:  140 - 180 mg/dL   Lab Results  Component Value Date   GLUCAP 102 (H) 08/18/2015   HGBA1C 6.8 (H) 09/20/2014    Review of Glycemic Control  Results for GLYN, BARRAZA (MRN [id number]) as of 08/18/2015 08:19  Ref. Range 08/17/2015 06:08 08/17/2015 11:19 08/17/2015 16:06 08/18/2015 04:40 08/18/2015 07:36  Glucose-Capillary Latest Ref Range: 65 - 99 mg/dL 234 (H) 329 (H) 305 (H) 111 (H) 102 (H)    Diabetes history: Type 2 Outpatient Diabetes medications: Lantus 5 units qhs, Novolog 2-12 units tid/hs Current orders for Inpatient glycemic control: Lantus 5 units qhs, Novolog 0-9 units q4h  Inpatient Diabetes Program Recommendations:  Agree with current medications for blood sugar management.  Gentry Fitz, RN, BA, MHA, CDE Diabetes Coordinator Inpatient Diabetes Program  [phone] (Team Pager) [phone] ([place]) 08/18/2015 8:24 AM

## 2015-08-18 NOTE — Care Management Note (Signed)
Case Management Note Marvetta Gibbons RN, BSN Unit 2W-Case Manager [phone]  Patient Details  Name: [name] MRN: [id number] Date of Birth: [birth year]  Subjective/Objective:    Pt admitted with AMS- hypernatremia, AKI, SIRS                Action/Plan: PTA pt lived at [facility] consulted for return to SNF when medically stable. Consult received on 8/7- for oral vanc needs- spoke with Dr. Wynelle Cleveland to clarify consult- at this time pt does not need oral vanc- CSW will continue to follow for d/c needs- PC consulted for [place]  Expected Discharge Date:                  Expected Discharge Plan:  [place]  In-House Referral:  Clinical Social Work  Discharge planning Services  CM Consult  Post Acute Care Choice:    Choice offered to:     DME Arranged:    DME Agency:     HH Arranged:    [place] Agency:     Status of Service:  In process, will continue to follow  If discussed at Long Length of Stay Meetings, dates discussed:    Additional Comments:  Dawayne Patricia, RN 08/18/2015, 10:23 AM

## 2015-08-18 NOTE — Consult Note (Addendum)
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  Education provided:  Pt has total assistance with ostomy care prior to admission, according to the EMR.  Applied 2 piece pouch and ordered supplies for bedside nurses.  [facility] Nurse wound consult note Reason for Consult: Consult requested for bilat great toes and left leg. Wound type: 3 areas of chronic full thickness wounds Left great toe tip: 1.2X.3X.2cm, dark red and dry, no odor or drainage Right great toe tip:.3X.5X.2cm, dark red and dry, no odor or drainage  Right inner foot with deep tissue injury; 1X1cm Pressure Ulcer POA: Yes Left posterior leg with full thickness wound; .5X1cm, 80% red, 20% yellow slough, small amt yellow drainage, no odor Dressing procedure/placement/frequency: Bactroban to promote moist healing to leg and toe wounds.  No family present to discuss the plan of care. Please re-consult if further assistance is needed.  Thank-you,  Julien Girt MSN, [place], [place], [place], [place]

## 2015-08-19 ENCOUNTER — Inpatient Hospital Stay (HOSPITAL_COMMUNITY): Payer: Medicare Other

## 2015-08-19 DIAGNOSIS — N3000 Acute cystitis without hematuria: Secondary | ICD-10-CM

## 2015-08-19 DIAGNOSIS — N179 Acute kidney failure, unspecified: Secondary | ICD-10-CM

## 2015-08-19 DIAGNOSIS — J189 Pneumonia, unspecified organism: Secondary | ICD-10-CM | POA: Clinically undetermined

## 2015-08-19 DIAGNOSIS — D631 Anemia in chronic kidney disease: Secondary | ICD-10-CM

## 2015-08-19 DIAGNOSIS — I5032 Chronic diastolic (congestive) heart failure: Secondary | ICD-10-CM

## 2015-08-19 DIAGNOSIS — Z789 Other specified health status: Secondary | ICD-10-CM

## 2015-08-19 DIAGNOSIS — E870 Hyperosmolality and hypernatremia: Principal | ICD-10-CM

## 2015-08-19 DIAGNOSIS — J9601 Acute respiratory failure with hypoxia: Secondary | ICD-10-CM

## 2015-08-19 DIAGNOSIS — N189 Chronic kidney disease, unspecified: Secondary | ICD-10-CM

## 2015-08-19 DIAGNOSIS — Z515 Encounter for palliative care: Secondary | ICD-10-CM

## 2015-08-19 LAB — BASIC METABOLIC PANEL
Anion gap: 13 (ref 5–15)
Anion gap: 10 (ref 5–15)
Anion gap: 13 (ref 5–15)
Anion gap: 11 (ref 5–15)
BUN: 126 mg/dL — AB (ref 6–20)
BUN: 136 mg/dL — AB (ref 6–20)
BUN: 139 mg/dL — AB (ref 6–20)
BUN: 145 mg/dL — AB (ref 6–20)
CALCIUM: 10.1 mg/dL (ref 8.9–10.3)
CHLORIDE: 117 mmol/L — AB (ref 101–111)
CHLORIDE: 115 mmol/L — AB (ref 101–111)
CO2: 17 mmol/L — AB (ref 22–32)
CO2: 17 mmol/L — ABNORMAL LOW (ref 22–32)
CO2: 16 mmol/L — ABNORMAL LOW (ref 22–32)
CO2: 18 mmol/L — ABNORMAL LOW (ref 22–32)
CREATININE: 3.23 mg/dL — AB (ref 0.61–1.24)
CREATININE: 3.97 mg/dL — AB (ref 0.61–1.24)
Calcium: 9.8 mg/dL (ref 8.9–10.3)
Calcium: 9.5 mg/dL (ref 8.9–10.3)
Calcium: 9.2 mg/dL (ref 8.9–10.3)
Chloride: 120 mmol/L — ABNORMAL HIGH (ref 101–111)
Chloride: 120 mmol/L — ABNORMAL HIGH (ref 101–111)
Creatinine, Ser: 4.08 mg/dL — ABNORMAL HIGH (ref 0.61–1.24)
Creatinine, Ser: 4 mg/dL — ABNORMAL HIGH (ref 0.61–1.24)
GFR calc Af Amer: 21 mL/min — ABNORMAL LOW (ref >60)
GFR calc Af Amer: 16 mL/min — ABNORMAL LOW (ref >60)
GFR calc Af Amer: 16 mL/min — ABNORMAL LOW (ref >60)
GFR calc Af Amer: 16 mL/min — ABNORMAL LOW (ref >60)
GFR calc non Af Amer: 14 mL/min — ABNORMAL LOW (ref >60)
GFR, EST NON AFRICAN AMERICAN: 18 mL/min — AB (ref >60)
GFR, EST NON AFRICAN AMERICAN: 14 mL/min — AB (ref >60)
GFR, EST NON AFRICAN AMERICAN: 14 mL/min — AB (ref >60)
GLUCOSE: 109 mg/dL — AB (ref 65–99)
GLUCOSE: 163 mg/dL — AB (ref 65–99)
GLUCOSE: 238 mg/dL — AB (ref 65–99)
GLUCOSE: 164 mg/dL — AB (ref 65–99)
POTASSIUM: 5.6 mmol/L — AB (ref 3.5–5.1)
POTASSIUM: 5.5 mmol/L — AB (ref 3.5–5.1)
POTASSIUM: 4.7 mmol/L (ref 3.5–5.1)
Potassium: 4.9 mmol/L (ref 3.5–5.1)
Sodium: 150 mmol/L — ABNORMAL HIGH (ref 135–145)
Sodium: 147 mmol/L — ABNORMAL HIGH (ref 135–145)
Sodium: 146 mmol/L — ABNORMAL HIGH (ref 135–145)
Sodium: 144 mmol/L (ref 135–145)

## 2015-08-19 LAB — BLOOD GAS, ARTERIAL
Acid-base deficit: 9.4 mmol/L — ABNORMAL HIGH (ref 0.0–2.0)
Bicarbonate: 15.3 mEq/L — ABNORMAL LOW (ref 20.0–24.0)
Drawn by: 449561
FIO2: 1
O2 Saturation: 92.2 %
Patient temperature: 101.9
TCO2: 16.1 mmol/L (ref 0–100)
pCO2 arterial: 31.9 mmHg — ABNORMAL LOW (ref 35.0–45.0)
pH, Arterial: 7.312 — ABNORMAL LOW (ref 7.350–7.450)
pO2, Arterial: 77.5 mmHg — ABNORMAL LOW (ref 80.0–100.0)

## 2015-08-19 LAB — GLUCOSE, CAPILLARY
GLUCOSE-CAPILLARY: 91 mg/dL (ref 65–99)
GLUCOSE-CAPILLARY: 148 mg/dL — AB (ref 65–99)
GLUCOSE-CAPILLARY: 222 mg/dL — AB (ref 65–99)
Glucose-Capillary: 114 mg/dL — ABNORMAL HIGH (ref 65–99)
Glucose-Capillary: 159 mg/dL — ABNORMAL HIGH (ref 65–99)
Glucose-Capillary: 236 mg/dL — ABNORMAL HIGH (ref 65–99)

## 2015-08-19 LAB — URINE CULTURE: Culture: >=100000 — AB

## 2015-08-19 MED ORDER — MORPHINE SULFATE (PF) 2 MG/ML IV SOLN
2.0000 mg | Freq: Once | INTRAVENOUS | Status: AC
  Administered 2015-08-19: 2 mg via INTRAVENOUS
  Filled 2015-08-19: qty 1

## 2015-08-19 MED ORDER — FREE WATER
200.0000 mL | Freq: Four times a day (QID) | Status: DC
  Administered 2015-08-20 (×3): 200 mL

## 2015-08-19 MED ORDER — CHLORHEXIDINE GLUCONATE 0.12 % MT SOLN
15.0000 mL | Freq: Two times a day (BID) | OROMUCOSAL | Status: DC
  Administered 2015-08-19 – 2015-08-21 (×4): 15 mL via OROMUCOSAL
  Filled 2015-08-19 (×2): qty 15

## 2015-08-19 MED ORDER — SODIUM CHLORIDE 0.9 % IV BOLUS (SEPSIS)
500.0000 mL | Freq: Once | INTRAVENOUS | Status: AC
  Administered 2015-08-19: 500 mL via INTRAVENOUS

## 2015-08-19 MED ORDER — SODIUM CHLORIDE 0.9 % IV BOLUS (SEPSIS): 250.0000 mL | Freq: Once | INTRAVENOUS | Status: AC

## 2015-08-19 MED ORDER — SODIUM CHLORIDE 0.9 % IV BOLUS (SEPSIS)
500.0000 mL | Freq: Once | INTRAVENOUS | Status: DC
  Administered 2015-08-19: 500 mL via INTRAVENOUS

## 2015-08-19 MED ORDER — CETYLPYRIDINIUM CHLORIDE 0.05 % MT LIQD
7.0000 mL | Freq: Two times a day (BID) | OROMUCOSAL | Status: DC
  Administered 2015-08-20 – 2015-08-21 (×3): 7 mL via OROMUCOSAL

## 2015-08-19 MED ORDER — SODIUM CHLORIDE 0.9 % IV BOLUS (SEPSIS)
1000.0000 mL | Freq: Once | INTRAVENOUS | Status: DC
Start: 2015-08-19 — End: 2015-08-19

## 2015-08-19 MED ORDER — GLYCOPYRROLATE 0.2 MG/ML IJ SOLN
0.2000 mg | Freq: Four times a day (QID) | INTRAMUSCULAR | Status: DC | PRN
  Administered 2015-08-19 – 2015-08-20 (×2): 0.2 mg via INTRAVENOUS
  Filled 2015-08-19 (×3): qty 1

## 2015-08-19 MED ORDER — SODIUM CHLORIDE 0.9 % IV BOLUS (SEPSIS): 250.0000 mL | Freq: Once | INTRAVENOUS | Status: DC

## 2015-08-19 NOTE — Progress Notes (Signed)
Daily Progress Note   Patient Name: [name]       Date: 08/19/2015 DOB: 1945/04/16  Age: 70 y.o. MRN#: [id number] Attending Physician: Eugenie Filler, MD Primary Care Physician: Wenda Low, MD Admit Date: 08/12/2015  Reason for Consultation/Follow-up: Establishing goals of care  Subjective: Patient barely opens his eyes.  He is unable to speak.  Rapid response called in early this morning for acute respiratory failure. Patient appears to be actively dying.  I spoke on the phone with his sister [name] and let her know my assessment.  She requested that we keep him on the non rebreather mask for now and now shift to total comfort until she can contact the family and give them a chance to visit.  She will be in contact with them today.  She is attending a family funeral today at 1:00 pm.  Length of Stay: 2  Current Medications: Scheduled Meds:  . aspirin  81 mg Oral Daily  . ceFEPime (MAXIPIME) IV  1 g Intravenous Q24H  . Chlorhexidine Gluconate Cloth  6 each Topical Q0600  . enoxaparin (LOVENOX) injection  30 mg Subcutaneous Q24H  . ferrous sulfate  300 mg Per Tube Daily  . free water  200 mL Per Tube Q4H  . insulin aspart  0-9 Units Subcutaneous Q4H  . insulin glargine  5 Units Subcutaneous QHS  . lactose free nutrition  1 Container Oral TID WC  . mupirocin cream   Topical Daily  . mupirocin ointment  1 application Nasal BID  . pantoprazole sodium  40 mg Per Tube Daily  . sertraline  150 mg Per Tube Daily  . tamsulosin  0.4 mg Oral QPC supper  . vancomycin  1,000 mg Intravenous Q48H    Continuous Infusions: . dextrose 75 mL/hr at 08/19/15 0842    PRN Meds: acetaminophen **OR** acetaminophen, glycopyrrolate  Physical Exam          Vital Signs: BP (!) 93/47    Pulse 89   Temp (!) 101.9 F (38.8 C) (Rectal)   Resp (!) 46   Ht 5\' 10"  (1.778 m)   Wt 89 kg (196 lb 3.4 oz)   SpO2 95%   BMI 28.15 kg/m  SpO2: SpO2: 95 % O2 Device: O2 Device: Nasal Cannula O2 Flow Rate: O2 Flow Rate (L/min): 4  L/min  Intake/output summary:   Intake/Output Summary (Last 24 hours) at 08/19/15 1052 Last data filed at 08/19/15 1030  Gross per 24 hour  Intake              650 ml  Output              700 ml  Net              -50 ml   LBM: Last BM Date: 08/18/15 Baseline Weight: Weight: 85.6 kg (188 lb 11.4 oz) Most recent weight: Weight: 89 kg (196 lb 3.4 oz)       Palliative Assessment/Data:    Flowsheet Rows   Flowsheet Row Most Recent Value  Intake Tab  Referral Department  Hospitalist  Unit at Time of Referral  Cardiac/Telemetry Unit  Palliative Care Primary Diagnosis  Other (Comment)  Date Notified  08/17/15  Palliative Care Type  New Palliative care  Reason for referral  Clarify Goals of Care  Date of Admission  08/24/2015  Date first seen by Palliative Care  08/18/15  # of days Palliative referral response time  1 Day(s)  # of days IP prior to Palliative referral  1  Clinical Assessment  Palliative Performance Scale Score  20%  Psychosocial & Spiritual Assessment  Palliative Care Outcomes  Patient/Family meeting held?  Yes  Who was at the meeting?  Thersa Salt on the phone      Patient Active Problem List   Diagnosis Date Noted  . HCAP (healthcare-associated pneumonia) 08/19/2015  . Encounter for hospice care discussion   . Hypernatremia 08/17/2015  . DNR (do not resuscitate) discussion 01/06/2015  . Acute-on-chronic kidney injury ([place]) 01/02/2015  . Chronic diastolic CHF (congestive heart failure) ([place]) 09/20/2014  . Essential hypertension 09/20/2014  . Diabetes ([place]) 11/03/2013  . Chronic kidney disease 08/14/2012  . Palliative care encounter 07/12/2012  . UTI (urinary tract infection) 07/03/2012  . Anemia in chronic renal disease  06/01/2012  . Type 2 diabetes mellitus ([place]) 06/01/2012  . FISTULA, INTESTINE 02/23/2010  . ADENOCARCINOMA, COLON, HX OF 04/23/2009  . Alzheimer's disease 11/18/2008    Palliative Care Assessment & Plan   Patient Profile: 70 y.o. male  with past medical history of  admitted on 09/01/2015 with Alzheimers dementia, dysphagia with recurrent aspiration, bilateral great toe wounds, Afib, CKD stage IV,  frequent falls and a history of colon cancer.  Assessment: [name] decline overnight.  He is now barely responsive, and in Acute respiratory failure.  Family wants an opportunity to see him before shifting to comfort care.  Recommendations/Plan:  Will likely shift to total comfort later today or early tomorrow.    Goals of Care and Additional Recommendations:  Limitations on Scope of Treatment: Do not escalate care.  Code Status:   DNR   Prognosis:   Hours - Days  Discharge Planning:  Anticipated Hospital Death  Care plan was discussed with RN and sister [name]  Thank you for allowing the Palliative Medicine Team to assist in the care of this patient.   Time In: 9:30 Time Out: 10:00 Total Time 30 Prolonged Time Billed no      Greater than 50%  of this time was spent counseling and coordinating care related to the above assessment and plan.  Imogene Burn, PA-C Palliative Medicine Pager: [phone]  Please contact Palliative Medicine Team phone at [phone] for questions and concerns.

## 2015-08-19 NOTE — Progress Notes (Signed)
Pt given scheduled 200 mL of free water. Check g-tube residual - no residual.   Fritz Pickerel, RN

## 2015-08-19 NOTE — Significant Event (Addendum)
Rapid Response Event Note  Called by Ivin Booty, RN to evaluate pt who appears more lethargic, remains nonverbal, developed Temp 101.9, RA sats 77 %  Overview: Time Called: 0400 Arrival Time: 0405 Event Type: Respiratory  Initial Focused Assessment:  On arrival to room patient lying in bed, opened eyes to voice and tactile stimulation being pulled up in bed. PERL, Does not follow commands.    CBG 91 .   RR 56, shallow with use of abdominal muscles.  BIlateral BS clear, diminished in bases. Appears in mild respiratory distress. SR 96 on monitor  SBP 91/46.  Given MSO4 2 mg for generalized discomfort and tachypnea.    Interventions:  Repositioned in bed and Placed on 4l [place] with O2 sats 95%.  Spoke with Chaney Malling, NP as to obtaining PCXR/ABG given pt's DNR/?Paliative status.  Per Belenda Cruise we will go ahead and order PCXR and ABG.  She has  contacted family to determine family wishes at this point in pt's declining status.  Chaney Malling, NP  at bedside at 0500.    Stat PCXR indicates small right pleural effusion.  Bilateral airspace opacities r>l  ?PNA vs ?Pulmonary Edema.  NO pneumothorax Temp  Treated with tylenol 650 mg supp.  ABG on 4l [place].   7.31  PCO2 31.3  PO2 77    Pt placed on NRB mask at 15 l    Plan of  Care (if not transferred):  Continue to monitor VS and respiratory status as ordered.  Call for sats less than 93% Continuous pulse OX.  Keep family apprised of pt status. Handoff report given to [place], Therapist, sports.  Will continue to offer support as needed.  Event Summary: Name of Physician Notified: Chaney Malling, NP at [phone]        Outcome: Stayed in room and stabalized    Atonya Templer, [place]

## 2015-08-19 NOTE — Progress Notes (Signed)
Pt had elevated respirations while experiencing orthopnea, temperature, and a decreased level of consciousness. Placed pt on 5 liters O2, gave tylenol and contacted on call NP. Gave morphine per order. Contacted Rapid Response RN to review pt. Chest xray and ABG obtained. Will continue to follow. 

## 2015-08-19 NOTE — Progress Notes (Signed)
Shift event note:  Notified by RN initially  that pt w/ fever of 101.9, BP-94/40 and slightly increased WOB. Order placed for small IVF bolus and pt to receive Tylenol suppository and MS 2 mg IV.  Approx 30 min later RN notified pt w/ persistent increased WOB and less responsive w/ 02 sats in the 70's. RR RN was paged and responded to bedside. At bedside pt noted w/ RR in the upper 40's, per RN improved since IV morphine. Pt will open eyes briefly but falls asleep quickly if not stimulated. 02 sats in upper 80's on 6L [place] but pt mouth breathing, changed to NRB w/ immediate return of 02 sats to > 95%. BBS very diminished. ABG reveals 7.3/31.9/77.5/15.3. CXR > "Small right pleural effusion noted. Bibasilar airspace opacities, right greater than left, raise concern for multifocal pneumonia. Pulmonary edema remains a possibility". After some observation at bedside RR improved on NRB. Pt appears to be resting comfortably.  Assessment/Plan: 1. Acute hypoxic respiratory failure: Improved w/ NRB. CXR findings c/w PNA vs edema w/ small (R) pleural effusion. Continue Vanc and Cefepime. Pt stabilized in room. Spoke w/ sister "Festus Holts" by phone to update on status change and  discuss code status. When it was decided pt did not need to be transferred updated sister "[name]" by phone. Will leave pt in med-surg bed w/ low threshold to transfer to SDU if pt deteriorates further. Palliative Care meeting pending.   Jeryl Columbia, NP [facility] Pager [phone]

## 2015-08-19 NOTE — Progress Notes (Signed)
PROGRESS NOTE    [name]  [id number] DOB: [birth year] DOA: 09/03/2015 PCP: Derrick Low, MD    Brief Narrative:  [name] is an 70 y.o. male from [facility] with significant for dementia, chronic diastolic heart failure and chronic kidney disease stage IV, status post colectomy due to colon cancer with a PEG tube who presented with acute encephalopathy and hypernatremia. He had a [name] grade fever (99),  leukocytosis and a h/o recent aspiration and therefore he was started on  IV vancomycin and cefepime. Patient with acute hypoxia respiratory failure the night of 08/18/2015 to 08/19/2015 currently on a nonrebreather. Urine cultures positive for Klebsiella pneumonia.    Assessment & Plan:   Principal Problem:   Acute respiratory failure with hypoxia (HCC) Active Problems:   UTI (urinary tract infection)   Hypernatremia   HCAP (healthcare-associated pneumonia)   Alzheimer's disease   Anemia in chronic renal disease   Type 2 diabetes mellitus (HCC)   Chronic diastolic CHF (congestive heart failure) (HCC)   Essential hypertension   Acute-on-chronic kidney injury ([place])   Goals of care, counseling/discussion   Encounter for hospice care discussion   Palliative care by specialist  #1 acute respiratory failure with hypoxia/HCAP Patient decompensated overnight currently requiring BiPAP chest x-ray done concerning for multifocal pneumonia versus edema with small right pleural effusion. Patient with borderline blood pressure. Patient noted to have a fever overnight. Patient minimally responsive opens eyes to verbal stimuli. Concerned that patient is actively dying palliative care following. Blood cultures pending. Urine cultures positive for greater than 100,000 Klebsiella pneumoniae which is sensitive to the cephalosporins. Continue empiric IV vancomycin and IV cefepime. Follow.  #2 sepsis secondary to UTI and probable healthcare associated pneumonia Urinalysis on  admission concern for urinary tract infection. Urine cultures with greater than 100,000 Klebsiella pneumonia. Repeat chest x-ray this morning concerning for multifocal pneumonia versus edema. Patient requiring high level of O2 on a nonrebreather. Blood cultures pending. Continue empiric IV vancomycin and IV cefepime.  #3 hypernatremia Likely secondary to hypovolemic hypernatremia as patient noted to have borderline blood pressure and likely with poor oral intake with a history of dysphasia. Increase D5W to 75 mL per hour. Continue current free water flushes. Follow.  #4 acute encephalopathy superimposed on Alzheimer's disease Likely multifactorial secondary to sepsis secondary to UTI and probable healthcare associated pneumonia in the setting of Alzheimer's disease and hypernatremia. Patient minimally responsive. Patient may be actively dying. Increase D5W to 75 mL per hour and continue free water flushes. Continue empiric IV antibiotics. Palliative care following an meeting for goals of care.  #5 acute on chronic kidney disease stage IV Per Dr. Wynelle Cleveland, family has already been told patient was noted ounces candidate. Baseline creatinine 2-2.5. Creatinine worsening on a daily basis and currently at 4.08. Increase D5W to 75 mL per hour. Continue free water flushes. Follow.  #6 anemia of chronic disease Follow H&H.  #7 type 2 diabetes mellitus Hemoglobin A1c was 6.8 on 09/20/2014.CBGs ranging from 91-236. Continue Lantus sliding scale insulin.  #8 dysphagia Status post PEG.  #9 chronic diastolic heart failure Patient seems compensated on examination. Lasix of been discontinued.  #10 prognosis Patient with a poor prognosis with history of Alzheimer's disease, dysphagia with recurrent aspiration, status post PEG placement, presented to the ED with altered mental status noted to be severely hypernatremic and minimally responsive and septic secondary to UTI and probable healthcare associated  pneumonia. Patient decompensated overnight into acute respiratory failure with hypoxia requiring  nonrebreather and now minimally responsive. Patient would worsening renal function. Concern that patient may be actively dying. Palliative care has been consulted and is following and will meet with the family this afternoon. Patient is currently a DO NOT RESUSCITATE.    DVT prophylaxis: Lovenox Code Status: DO NOT RESUSCITATE Family Communication: Updated sister [place] at bedside and family. Disposition Plan: Pending palliative care goals of care meeting with family.   Consultants:   Palliative care: Imogene Burn, [place] 08/18/2015  Procedures:   CT head 08/17/2015  Chest x-ray 08/17/2015, 08/19/2015  Antimicrobials:   IV cefepime 08/17/2015  IV vancomycin 08/17/2015  IV Zosyn 08/17/2015 1 dose.   Subjective: Patient opens eyes to light sternal rub and to verbal stimuli however drowsy and minimally responsive. Family at bedside.  Objective: Vitals:   08/19/15 0505 08/19/15 0654 08/19/15 1315 08/19/15 1722  BP: (!) 93/47  110/75   Pulse: 89  67   Resp:   20   Temp:   99.2 F (37.3 C) 98.6 F (37 C)  TempSrc:   Axillary Axillary  SpO2:   100%   Weight:  89 kg (196 lb 3.4 oz)    Height:        Intake/Output Summary (Last 24 hours) at 08/19/15 1817 Last data filed at 08/19/15 1538  Gross per 24 hour  Intake              250 ml  Output              850 ml  Net             -600 ml   Filed Weights   08/17/15 0425 08/18/15 0441 08/19/15 0654  Weight: 85.6 kg (188 lb 11.4 oz) 87.3 kg (192 lb 7.4 oz) 89 kg (196 lb 3.4 oz)    Examination:  General exam: Appears calm and comfortable. Minimally responsive. On NRB  Respiratory system: Coarse BS anterior lung fields. Respiratory effort normal. Cardiovascular system: S1 & S2 heard, RRR. No JVD, murmurs, rubs, gallops or clicks. No pedal edema. Gastrointestinal system: Abdomen is nondistended, soft and nontender. No  organomegaly or masses felt. Normal bowel sounds heard. Central nervous system: Alert and oriented. No focal neurological deficits. Extremities: Symmetric 5 x 5 power. Skin: No rashes, lesions or ulcers Psychiatry: unable to assess due to mental status.     Data Reviewed: I have personally reviewed following labs and imaging studies  CBC:  Recent Labs Lab 08/17/15 0037 08/17/15 0051 08/17/15 0439  WBC 13.4*  --  12.9*  NEUTROABS 11.4*  --   --   HGB 12.0* 12.9* 11.3*  HCT 39.5 38.0* 37.1*  MCV 82.5  --  82.6  PLT 166  --  0000000*   Basic Metabolic Panel:  Recent Labs Lab 08/18/15 1441 08/18/15 1956 08/19/15 0159 08/19/15 0836 08/19/15 1414  NA 151* 149* 150* 147* 146*  K 4.2 4.7 4.9 5.6* 5.5*  CL 124* 123* 120* 120* 117*  CO2 20* 19* 17* 17* 16*  GLUCOSE 138* 133* 109* 163* 238*  BUN 127* 123* 126* 136* 139*  CREATININE 2.82* 2.84* 3.23* 3.97* 4.08*  CALCIUM 10.0 9.9 10.1 9.8 9.5   GFR: Estimated Creatinine Clearance: 18.9 mL/min (by C-G formula based on SCr of 4.08 mg/dL). Liver Function Tests:  Recent Labs Lab 08/17/15 0037  AST 40  ALT 57  ALKPHOS 157*  BILITOT 0.4  PROT 8.7*  ALBUMIN 3.3*   No results for input(s): LIPASE, AMYLASE in the last 168  hours. No results for input(s): AMMONIA in the last 168 hours. Coagulation Profile: No results for input(s): INR, PROTIME in the last 168 hours. Cardiac Enzymes: No results for input(s): CKTOTAL, CKMB, CKMBINDEX, TROPONINI in the last 168 hours. BNP (last 3 results) No results for input(s): PROBNP in the last 8760 hours. HbA1C: No results for input(s): HGBA1C in the last 72 hours. CBG:  Recent Labs Lab 08/19/15 0057 08/19/15 0438 08/19/15 0800 08/19/15 1132 08/19/15 1553  GLUCAP 114* 91 148* 236* 222*   Lipid Profile: No results for input(s): CHOL, HDL, LDLCALC, TRIG, CHOLHDL, LDLDIRECT in the last 72 hours. Thyroid Function Tests: No results for input(s): TSH, T4TOTAL, FREET4, T3FREE,  THYROIDAB in the last 72 hours. Anemia Panel: No results for input(s): VITAMINB12, FOLATE, FERRITIN, TIBC, IRON, RETICCTPCT in the last 72 hours. Sepsis Labs:  Recent Labs Lab 08/17/15 0051  LATICACIDVEN 1.60    Recent Results (from the past 240 hour(s))  Blood Culture (routine x 2)     Status: None (Preliminary result)   Collection Time: 08/17/15 12:25 AM  Result Value Ref Range Status   Specimen Description BLOOD LEFT ARM  Final   Special Requests BOTTLES DRAWN AEROBIC AND ANAEROBIC 5CC   Final   Culture NO GROWTH 2 DAYS  Final   Report Status PENDING  Incomplete  Blood Culture (routine x 2)     Status: None (Preliminary result)   Collection Time: 08/17/15 12:37 AM  Result Value Ref Range Status   Specimen Description BLOOD RIGHT HAND  Final   Special Requests BOTTLES DRAWN AEROBIC AND ANAEROBIC 5CC   Final   Culture NO GROWTH 2 DAYS  Final   Report Status PENDING  Incomplete  Urine culture     Status: Abnormal   Collection Time: 08/17/15  2:43 AM  Result Value Ref Range Status   Specimen Description URINE, RANDOM  Final   Special Requests NONE  Final   Culture >=100,000 COLONIES/mL KLEBSIELLA PNEUMONIAE (A)  Final   Report Status 08/19/2015 FINAL  Final   Organism ID, Bacteria KLEBSIELLA PNEUMONIAE (A)  Final      Susceptibility   Klebsiella pneumoniae - MIC*    AMPICILLIN >=32 RESISTANT Resistant     CEFAZOLIN <=4 SENSITIVE Sensitive     CEFTRIAXONE <=1 SENSITIVE Sensitive     CIPROFLOXACIN <=0.25 SENSITIVE Sensitive     GENTAMICIN <=1 SENSITIVE Sensitive     IMIPENEM <=0.25 SENSITIVE Sensitive     NITROFURANTOIN 64 INTERMEDIATE Intermediate     TRIMETH/SULFA <=20 SENSITIVE Sensitive     AMPICILLIN/SULBACTAM 4 SENSITIVE Sensitive     PIP/TAZO 8 SENSITIVE Sensitive     Extended ESBL NEGATIVE Sensitive     * >=100,000 COLONIES/mL KLEBSIELLA PNEUMONIAE  MRSA PCR Screening     Status: Abnormal   Collection Time: 08/17/15  5:18 AM  Result Value Ref Range Status    MRSA by PCR POSITIVE (A) NEGATIVE Final    Comment:        The GeneXpert MRSA Assay (FDA approved for NASAL specimens only), is one component of a comprehensive MRSA colonization surveillance program. It is not intended to diagnose MRSA infection nor to guide or monitor treatment for MRSA infections. RESULT CALLED TO, READ BACK BY AND VERIFIED WITH: GARDNER,D RN [id number] 08/17/15 MITCHELL,L          Radiology Studies: Dg Chest Port 1 View  Result Date: 08/19/2015 CLINICAL DATA:  Acute onset of shortness of breath. Initial encounter. EXAM: PORTABLE CHEST 1 VIEW COMPARISON:  Chest  radiograph performed 08/17/2015 FINDINGS: The lungs are well-aerated. A small right pleural effusion is noted. Bibasilar airspace opacities, right greater than left, raise concern for multifocal pneumonia. Pulmonary edema remains a possibility. No pneumothorax is seen. The cardiomediastinal silhouette is borderline normal in size. No acute osseous abnormalities are seen. IMPRESSION: Small right pleural effusion noted. Bibasilar airspace opacities, right greater than left, raise concern for multifocal pneumonia. Pulmonary edema remains a possibility. Electronically Signed   By: Garald Balding M.D.   On: 08/19/2015 05:09        Scheduled Meds: . antiseptic oral rinse  7 mL Mouth Rinse q12n4p  . aspirin  81 mg Oral Daily  . ceFEPime (MAXIPIME) IV  1 g Intravenous Q24H  . chlorhexidine  15 mL Mouth Rinse BID  . Chlorhexidine Gluconate Cloth  6 each Topical Q0600  . enoxaparin (LOVENOX) injection  30 mg Subcutaneous Q24H  . ferrous sulfate  300 mg Per Tube Daily  . free water  200 mL Per Tube Q4H  . insulin aspart  0-9 Units Subcutaneous Q4H  . insulin glargine  5 Units Subcutaneous QHS  . mupirocin cream   Topical Daily  . mupirocin ointment  1 application Nasal BID  . pantoprazole sodium  40 mg Per Tube Daily  . sertraline  150 mg Per Tube Daily  . sodium chloride  1,000 mL Intravenous Once  . tamsulosin   0.4 mg Oral QPC supper  . vancomycin  1,000 mg Intravenous Q48H   Continuous Infusions: . dextrose 75 mL/hr at 08/19/15 0842     LOS: 2 days    Time spent: 59 mins    Fatima Fedie, MD [facility] Pager [phone]  If 7PM-7AM, please contact night-coverage [email or web address] Password [facility] 08/19/2015, 6:17 PM

## 2015-08-20 ENCOUNTER — Inpatient Hospital Stay (HOSPITAL_COMMUNITY): Payer: Medicare Other

## 2015-08-20 DIAGNOSIS — E119 Type 2 diabetes mellitus without complications: Secondary | ICD-10-CM

## 2015-08-20 DIAGNOSIS — Z7189 Other specified counseling: Secondary | ICD-10-CM

## 2015-08-20 LAB — BASIC METABOLIC PANEL
ANION GAP: 13 (ref 5–15)
Anion gap: 10 (ref 5–15)
BUN: 148 mg/dL — AB (ref 6–20)
BUN: 143 mg/dL — AB (ref 6–20)
CALCIUM: 9.2 mg/dL (ref 8.9–10.3)
CHLORIDE: 116 mmol/L — AB (ref 101–111)
CO2: 17 mmol/L — ABNORMAL LOW (ref 22–32)
CO2: 17 mmol/L — AB (ref 22–32)
CREATININE: 3.95 mg/dL — AB (ref 0.61–1.24)
Calcium: 9.5 mg/dL (ref 8.9–10.3)
Chloride: 115 mmol/L — ABNORMAL HIGH (ref 101–111)
Creatinine, Ser: 4.07 mg/dL — ABNORMAL HIGH (ref 0.61–1.24)
GFR calc non Af Amer: 14 mL/min — ABNORMAL LOW (ref >60)
GFR, EST AFRICAN AMERICAN: 16 mL/min — AB (ref >60)
GFR, EST AFRICAN AMERICAN: 16 mL/min — AB (ref >60)
GFR, EST NON AFRICAN AMERICAN: 14 mL/min — AB (ref >60)
GLUCOSE: 140 mg/dL — AB (ref 65–99)
Glucose, Bld: 153 mg/dL — ABNORMAL HIGH (ref 65–99)
POTASSIUM: 4.6 mmol/L (ref 3.5–5.1)
Potassium: 4.4 mmol/L (ref 3.5–5.1)
SODIUM: 145 mmol/L (ref 135–145)
Sodium: 143 mmol/L (ref 135–145)

## 2015-08-20 LAB — GLUCOSE, CAPILLARY
GLUCOSE-CAPILLARY: 142 mg/dL — AB (ref 65–99)
Glucose-Capillary: 139 mg/dL — ABNORMAL HIGH (ref 65–99)
Glucose-Capillary: 141 mg/dL — ABNORMAL HIGH (ref 65–99)
Glucose-Capillary: 134 mg/dL — ABNORMAL HIGH (ref 65–99)

## 2015-08-20 LAB — CBC
HEMATOCRIT: 25.5 % — AB (ref 39.0–52.0)
HEMOGLOBIN: 8.1 g/dL — AB (ref 13.0–17.0)
MCH: 25.1 pg — AB (ref 26.0–34.0)
MCHC: 31.8 g/dL (ref 30.0–36.0)
MCV: 78.9 fL (ref 78.0–100.0)
Platelets: 96 10*3/uL — ABNORMAL LOW (ref 150–400)
RBC: 3.23 MIL/uL — ABNORMAL LOW (ref 4.22–5.81)
RDW: 18 % — AB (ref 11.5–15.5)
WBC: 20.6 10*3/uL — ABNORMAL HIGH (ref 4.0–10.5)

## 2015-08-20 MED ORDER — SODIUM CHLORIDE 0.9 % IV SOLN: 250.0000 mL | INTRAVENOUS | Status: DC | PRN

## 2015-08-20 MED ORDER — MORPHINE SULFATE 25 MG/ML IV SOLN
5.0000 mg/h | INTRAVENOUS | Status: DC
  Administered 2015-08-20: 2 mg/h via INTRAVENOUS
  Filled 2015-08-20: qty 10

## 2015-08-20 MED ORDER — GLYCOPYRROLATE 0.2 MG/ML IJ SOLN
0.2000 mg | INTRAMUSCULAR | Status: DC | PRN
Start: 2015-08-20 — End: 2015-08-21
  Filled 2015-08-20: qty 1

## 2015-08-20 MED ORDER — ONDANSETRON 4 MG PO TBDP: 4.0000 mg | ORAL_TABLET | Freq: Four times a day (QID) | ORAL | Status: DC | PRN

## 2015-08-20 MED ORDER — SODIUM CHLORIDE 0.9% FLUSH: 3.0000 mL | INTRAVENOUS | Status: DC | PRN

## 2015-08-20 MED ORDER — LORAZEPAM 2 MG/ML PO CONC: 1.0000 mg | ORAL | Status: DC | PRN

## 2015-08-20 MED ORDER — LORAZEPAM 2 MG/ML IJ SOLN
1.0000 mg | Freq: Two times a day (BID) | INTRAMUSCULAR | Status: DC
  Administered 2015-08-20 – 2015-08-21 (×2): 1 mg via INTRAVENOUS
  Filled 2015-08-20 (×2): qty 1

## 2015-08-20 MED ORDER — LORAZEPAM 2 MG/ML IJ SOLN: 1.0000 mg | INTRAMUSCULAR | Status: DC | PRN

## 2015-08-20 MED ORDER — GLYCOPYRROLATE 1 MG PO TABS
1.0000 mg | ORAL_TABLET | ORAL | Status: DC | PRN
  Filled 2015-08-20: qty 1

## 2015-08-20 MED ORDER — MORPHINE BOLUS VIA INFUSION
2.0000 mg | INTRAVENOUS | Status: DC | PRN
  Administered 2015-08-21 (×2): 2 mg via INTRAVENOUS
  Filled 2015-08-20: qty 2

## 2015-08-20 MED ORDER — ONDANSETRON HCL 4 MG/2ML IJ SOLN: 4.0000 mg | Freq: Four times a day (QID) | INTRAMUSCULAR | Status: DC | PRN

## 2015-08-20 MED ORDER — HALOPERIDOL LACTATE 5 MG/ML IJ SOLN: 0.5000 mg | INTRAMUSCULAR | Status: DC | PRN

## 2015-08-20 MED ORDER — MORPHINE SULFATE (PF) 2 MG/ML IV SOLN
2.0000 mg | INTRAVENOUS | Status: DC | PRN
  Administered 2015-08-20: 2 mg via INTRAVENOUS
  Filled 2015-08-20: qty 1

## 2015-08-20 MED ORDER — LORAZEPAM 1 MG PO TABS
1.0000 mg | ORAL_TABLET | ORAL | Status: DC | PRN
Start: 2015-08-20 — End: 2015-08-21

## 2015-08-20 MED ORDER — SODIUM CHLORIDE 0.9% FLUSH
3.0000 mL | Freq: Two times a day (BID) | INTRAVENOUS | Status: DC
  Administered 2015-08-21: 3 mL via INTRAVENOUS

## 2015-08-20 MED ORDER — HALOPERIDOL 0.5 MG PO TABS
0.5000 mg | ORAL_TABLET | ORAL | Status: DC | PRN
  Filled 2015-08-20: qty 1

## 2015-08-20 MED ORDER — BIOTENE DRY MOUTH MT LIQD: 15.0000 mL | OROMUCOSAL | Status: DC | PRN

## 2015-08-20 MED ORDER — POLYVINYL ALCOHOL 1.4 % OP SOLN
1.0000 [drp] | Freq: Four times a day (QID) | OPHTHALMIC | Status: DC | PRN
  Filled 2015-08-20: qty 15

## 2015-08-20 MED ORDER — GLYCOPYRROLATE 0.2 MG/ML IJ SOLN
0.2000 mg | INTRAMUSCULAR | Status: DC | PRN
  Administered 2015-08-21: 0.2 mg via INTRAVENOUS
  Filled 2015-08-20 (×3): qty 1

## 2015-08-20 MED ORDER — HALOPERIDOL LACTATE 2 MG/ML PO CONC
0.5000 mg | ORAL | Status: DC | PRN
  Filled 2015-08-20: qty 0.3

## 2015-08-20 NOTE — Progress Notes (Signed)
Pharmacy Antibiotic Note  [name] is a 70 y.o. male admitted on 09/04/2015 from NH with AMS, possible PNA/sepsis.  Pharmacy has been consulted for Vancomycin and Cefepime dosing.  He is noted with klebsiella UTI. Plans noted for likely comfort care soon.   8/6 Vancomycin > 8/6 Cefepime >   8/6 MRSA PCR: positive 8/6 BCx: IP 8/6 UCx:  klebsiella (resistant to amp)  Plan: -No antibiotic dose changes -anticipate d/c antibiotics soon  Height: 5\' 10"  (177.8 cm) Weight: 197 lb 5 oz (89.5 kg) IBW/kg (Calculated) : 73  Temp (24hrs), Avg:98.8 F (37.1 C), Min:98.6 F (37 C), Max:99.2 F (37.3 C)   Recent Labs Lab 08/17/15 0037 08/17/15 0051 08/17/15 0439  08/19/15 0159 08/19/15 0836 08/19/15 1414 08/19/15 2045 08/20/15 0248  WBC 13.4*  --  12.9*  --   --   --   --   --  20.6*  CREATININE 3.27* 3.10* 3.07*  < > 3.23* 3.97* 4.08* 4.00* 4.07*  LATICACIDVEN  --  1.60  --   --   --   --   --   --   --   < > = values in this interval not displayed.  Estimated Creatinine Clearance: 19 mL/min (by C-G formula based on SCr of 4.07 mg/dL).    No Known Allergies  Thank you for asking pharmacy to be involved in the care of this patient.  Hildred Laser, Pharm D 08/20/2015 7:36 AM

## 2015-08-20 NOTE — Progress Notes (Signed)
PROGRESS NOTE    [name]  [id number] DOB: 11-01-1945 DOA: 08/20/2015 PCP: Wenda Low, MD    Brief Narrative:  [name] is an 70 y.o. male from [facility] with significant for dementia, chronic diastolic heart failure and chronic kidney disease stage IV, status post colectomy due to colon cancer with a PEG tube who presented with acute encephalopathy and hypernatremia. He had a low grade fever (99),  leukocytosis and a h/o recent aspiration and therefore he was started on  IV vancomycin and cefepime. Patient with acute hypoxia respiratory failure the night of 08/18/2015 to 08/19/2015 currently on a nonrebreather. Urine cultures positive for Klebsiella pneumonia.    Assessment & Plan:   Principal Problem:   Acute respiratory failure with hypoxia (HCC) Active Problems:   UTI (urinary tract infection)   Hypernatremia   HCAP (healthcare-associated pneumonia)   Alzheimer's disease   Anemia in chronic renal disease   Type 2 diabetes mellitus (HCC)   Chronic diastolic CHF (congestive heart failure) (HCC)   Essential hypertension   Acute-on-chronic kidney injury ([place])   Goals of care, counseling/discussion   Encounter for hospice care discussion   Palliative care by specialist  #1 acute respiratory failure with hypoxia/HCAP Patient decompensated overnight currently requiring BiPAP chest x-ray done concerning for multifocal pneumonia versus edema with small right pleural effusion. Patient with borderline blood pressure. Patient noted to have a fever overnight. Patient minimally responsive opens eyes to verbal stimuli. Concerned that patient is actively dying palliative care following. Blood cultures pending. Urine cultures positive for greater than 100,000 Klebsiella pneumoniae which is sensitive to the cephalosporins. On empiric IV vancomycin and IV cefepime. Follow. Per palliative care family have decided on full comfort measures at this time.  #2 sepsis  secondary to UTI and probable healthcare associated pneumonia Urinalysis on admission concern for urinary tract infection. Urine cultures with greater than 100,000 Klebsiella pneumonia. Repeat chest x-ray on 08/19/2015, concerning for multifocal pneumonia versus edema. Patient requiring high level of O2 on a nonrebreather. Blood cultures pending. On empiric IV vancomycin and IV cefepime. The palliative care family have decided on full comfort measures at this time.  #3 hypernatremia Likely secondary to hypovolemic hypernatremia as patient noted to have borderline blood pressure and likely with poor oral intake with a history of dysphasia. Hyponatremia has improved on D5W and free water flushes. Patient however not responding clinically. Follow.  #4 acute encephalopathy superimposed on Alzheimer's disease Likely multifactorial secondary to sepsis secondary to UTI and probable healthcare associated pneumonia in the setting of Alzheimer's disease and hypernatremia. Patient minimally responsive. Patient may be actively dying. Continue D5W and continue free water flushes. Continue empiric IV antibiotics. Palliative care following an meeting for goals of care.  #5 acute on chronic kidney disease stage IV Per Dr. Wynelle Cleveland, family has already been told patient was noted ounces candidate. Baseline creatinine 2-2.5. Creatinine with slight improvement at 3.95 from 4.08 yesterday. Decrease IV fluids. Continue free water flushes. Follow.  #6 anemia of chronic disease Follow H&H.  #7 type 2 diabetes mellitus Hemoglobin A1c was 6.8 on 09/20/2014.CBGs ranging from 134-142. Continue Lantus sliding scale insulin.  #8 dysphagia Status post PEG.  #9 chronic diastolic heart failure Patient seems compensated on examination. Lasix of been discontinued.  #10 prognosis Patient with a poor prognosis with history of Alzheimer's disease, dysphagia with recurrent aspiration, status post PEG placement, presented to the ED  with altered mental status noted to be severely hypernatremic and minimally responsive and septic  secondary to UTI and probable healthcare associated pneumonia. Patient decompensated overnight into acute respiratory failure with hypoxia requiring nonrebreather and now minimally responsive. Patient would worsening renal function. Concern that patient may be actively dying. Palliative care has been consulted and is following and will meet with the family this afternoon. Patient is currently a DO NOT RESUSCITATE. Per palliative care family have decided on full comfort measures at this time. Anticipating in-hospital death.    DVT prophylaxis: Lovenox Code Status: DO NOT RESUSCITATE Family Communication: Updated sister [place] at bedside and family. Disposition Plan: Pending palliative care goals of care meeting with family. Family have decided on full comfort measures. Anticipating hospital death.   Consultants:   Palliative care: Imogene Burn, [place] 08/18/2015  Procedures:   CT head 08/17/2015  Chest x-ray 08/17/2015, 08/19/2015  Antimicrobials:   IV cefepime 08/17/2015  IV vancomycin 08/17/2015  IV Zosyn 08/17/2015 1 dose.   Subjective: Patient opens eyes to verbal stimuli a little more alert today however just tracking.Family at bedside.  Objective: Vitals:   08/20/15 0825 08/20/15 0900 08/20/15 0930 08/20/15 1220  BP:      Pulse:      Resp:      Temp:      TempSrc:      SpO2: 92% 100% 98% 94%  Weight:      Height:        Intake/Output Summary (Last 24 hours) at 08/20/15 1949 Last data filed at 08/20/15 0745  Gross per 24 hour  Intake                0 ml  Output                0 ml  Net                0 ml   Filed Weights   08/18/15 0441 08/19/15 0654 08/20/15 0557  Weight: 87.3 kg (192 lb 7.4 oz) 89 kg (196 lb 3.4 oz) 89.5 kg (197 lb 5 oz)    Examination:  General exam: Appears calm and comfortable. Opens eyes to verbal stimuli and tracking. On NRB    Respiratory system: Coarse BS anterior lung fields. Respiratory effort normal. Cardiovascular system: S1 & S2 heard, RRR. No JVD, murmurs, rubs, gallops or clicks. No pedal edema. Gastrointestinal system: Abdomen is nondistended, soft and nontender. No organomegaly or masses felt. Normal bowel sounds heard. Central nervous system: Alert and oriented. No focal neurological deficits. Extremities: Symmetric 5 x 5 power. Skin: No rashes, lesions or ulcers Psychiatry: unable to assess due to mental status.     Data Reviewed: I have personally reviewed following labs and imaging studies  CBC:  Recent Labs Lab 08/17/15 0037 08/17/15 0051 08/17/15 0439 08/20/15 0248  WBC 13.4*  --  12.9* 20.6*  NEUTROABS 11.4*  --   --   --   HGB 12.0* 12.9* 11.3* 8.1*  HCT 39.5 38.0* 37.1* 25.5*  MCV 82.5  --  82.6 78.9  PLT 166  --  128* 96*   Basic Metabolic Panel:  Recent Labs Lab 08/19/15 0836 08/19/15 1414 08/19/15 2045 08/20/15 0248 08/20/15 0827  NA 147* 146* 144 145 143  K 5.6* 5.5* 4.7 4.6 4.4  CL 120* 117* 115* 115* 116*  CO2 17* 16* 18* 17* 17*  GLUCOSE 163* 238* 164* 153* 140*  BUN 136* 139* 145* 148* 143*  CREATININE 3.97* 4.08* 4.00* 4.07* 3.95*  CALCIUM 9.8 9.5 9.2 9.5 9.2   GFR: Estimated  Creatinine Clearance: 19.6 mL/min (by C-G formula based on SCr of 3.95 mg/dL). Liver Function Tests:  Recent Labs Lab 08/17/15 0037  AST 40  ALT 57  ALKPHOS 157*  BILITOT 0.4  PROT 8.7*  ALBUMIN 3.3*   No results for input(s): LIPASE, AMYLASE in the last 168 hours. No results for input(s): AMMONIA in the last 168 hours. Coagulation Profile: No results for input(s): INR, PROTIME in the last 168 hours. Cardiac Enzymes: No results for input(s): CKTOTAL, CKMB, CKMBINDEX, TROPONINI in the last 168 hours. BNP (last 3 results) No results for input(s): PROBNP in the last 8760 hours. HbA1C: No results for input(s): HGBA1C in the last 72 hours. CBG:  Recent Labs Lab  08/19/15 2010 08/20/15 0024 08/20/15 0446 08/20/15 0801 08/20/15 1129  GLUCAP 159* 139* 141* 142* 134*   Lipid Profile: No results for input(s): CHOL, HDL, LDLCALC, TRIG, CHOLHDL, LDLDIRECT in the last 72 hours. Thyroid Function Tests: No results for input(s): TSH, T4TOTAL, FREET4, T3FREE, THYROIDAB in the last 72 hours. Anemia Panel: No results for input(s): VITAMINB12, FOLATE, FERRITIN, TIBC, IRON, RETICCTPCT in the last 72 hours. Sepsis Labs:  Recent Labs Lab 08/17/15 0051  LATICACIDVEN 1.60    Recent Results (from the past 240 hour(s))  Blood Culture (routine x 2)     Status: None (Preliminary result)   Collection Time: 08/17/15 12:25 AM  Result Value Ref Range Status   Specimen Description BLOOD LEFT ARM  Final   Special Requests BOTTLES DRAWN AEROBIC AND ANAEROBIC 5CC   Final   Culture NO GROWTH 3 DAYS  Final   Report Status PENDING  Incomplete  Blood Culture (routine x 2)     Status: None (Preliminary result)   Collection Time: 08/17/15 12:37 AM  Result Value Ref Range Status   Specimen Description BLOOD RIGHT HAND  Final   Special Requests BOTTLES DRAWN AEROBIC AND ANAEROBIC 5CC   Final   Culture NO GROWTH 3 DAYS  Final   Report Status PENDING  Incomplete  Urine culture     Status: Abnormal   Collection Time: 08/17/15  2:43 AM  Result Value Ref Range Status   Specimen Description URINE, RANDOM  Final   Special Requests NONE  Final   Culture >=100,000 COLONIES/mL KLEBSIELLA PNEUMONIAE (A)  Final   Report Status 08/19/2015 FINAL  Final   Organism ID, Bacteria KLEBSIELLA PNEUMONIAE (A)  Final      Susceptibility   Klebsiella pneumoniae - MIC*    AMPICILLIN >=32 RESISTANT Resistant     CEFAZOLIN <=4 SENSITIVE Sensitive     CEFTRIAXONE <=1 SENSITIVE Sensitive     CIPROFLOXACIN <=0.25 SENSITIVE Sensitive     GENTAMICIN <=1 SENSITIVE Sensitive     IMIPENEM <=0.25 SENSITIVE Sensitive     NITROFURANTOIN 64 INTERMEDIATE Intermediate     TRIMETH/SULFA <=20 SENSITIVE  Sensitive     AMPICILLIN/SULBACTAM 4 SENSITIVE Sensitive     PIP/TAZO 8 SENSITIVE Sensitive     Extended ESBL NEGATIVE Sensitive     * >=100,000 COLONIES/mL KLEBSIELLA PNEUMONIAE  MRSA PCR Screening     Status: Abnormal   Collection Time: 08/17/15  5:18 AM  Result Value Ref Range Status   MRSA by PCR POSITIVE (A) NEGATIVE Final    Comment:        The GeneXpert MRSA Assay (FDA approved for NASAL specimens only), is one component of a comprehensive MRSA colonization surveillance program. It is not intended to diagnose MRSA infection nor to guide or monitor treatment for MRSA infections. RESULT  CALLED TO, READ BACK BY AND VERIFIED WITH: GARDNER,D RN [id number] 08/17/15 MITCHELL,L          Radiology Studies: Dg Chest Port 1 View  Result Date: 08/20/2015 CLINICAL DATA:  Shortness of Breath EXAM: PORTABLE CHEST 1 VIEW COMPARISON:  08/19/2015 FINDINGS: Cardiomediastinal silhouette is stable. No pulmonary edema. Small right pleural effusion again noted. Persistent bilateral basilar atelectasis or infiltrate right greater than left. IMPRESSION: No pulmonary edema. Small right pleural effusion again noted. Persistent bilateral basilar atelectasis or infiltrate right greater than left. Electronically Signed   By: Lahoma Crocker M.D.   On: 08/20/2015 08:34   Dg Chest Port 1 View  Result Date: 08/19/2015 CLINICAL DATA:  Acute onset of shortness of breath. Initial encounter. EXAM: PORTABLE CHEST 1 VIEW COMPARISON:  Chest radiograph performed 08/17/2015 FINDINGS: The lungs are well-aerated. A small right pleural effusion is noted. Bibasilar airspace opacities, right greater than left, raise concern for multifocal pneumonia. Pulmonary edema remains a possibility. No pneumothorax is seen. The cardiomediastinal silhouette is borderline normal in size. No acute osseous abnormalities are seen. IMPRESSION: Small right pleural effusion noted. Bibasilar airspace opacities, right greater than left, raise concern for  multifocal pneumonia. Pulmonary edema remains a possibility. Electronically Signed   By: Garald Balding M.D.   On: 08/19/2015 05:09        Scheduled Meds: . antiseptic oral rinse  7 mL Mouth Rinse q12n4p  . chlorhexidine  15 mL Mouth Rinse BID  . Chlorhexidine Gluconate Cloth  6 each Topical Q0600  . LORazepam  1 mg Intravenous BID  . mupirocin cream   Topical Daily  . mupirocin ointment  1 application Nasal BID  . pantoprazole sodium  40 mg Per Tube Daily  . sertraline  150 mg Per Tube Daily  . sodium chloride flush  3 mL Intravenous Q12H  . tamsulosin  0.4 mg Oral QPC supper   Continuous Infusions: . morphine 4 mg/hr (08/20/15 1855)     LOS: 3 days    Time spent: 37 mins    Naasia Weilbacher, MD [facility] Pager [phone]  If 7PM-7AM, please contact night-coverage [email or web address] Password [facility] 08/20/2015, 7:49 PM

## 2015-08-20 NOTE — Progress Notes (Signed)
No charge note  Received call from [facility].  We discussed [name] current status.  She would like to change mode of care to full comfort.  Implemented comfort order set.  Anticipate hospital death.     Imogene Burn, [name] Palliative Medicine Pager: [phone]

## 2015-08-20 NOTE — Progress Notes (Signed)
   08/20/15 1300  Clinical Encounter Type  Visited With Patient and family together  Visit Type Spiritual support  Referral From Nurse  Spiritual Encounters  Spiritual Needs Emotional  Stress Factors  Family Stress Factors Loss  Chaplain visited with youngest sister; family is expected to arrive later for meeting with Palliative Care. Chaplain advised them to call if we could be of assistance. Kiernan Atkerson, Chaplain

## 2015-08-20 NOTE — Progress Notes (Signed)
No charge note.  Received call from RN.  Patient showing signs of discomfort.  Ordered morphine 2 mg PRN. Attempted to call [place] once again.  Unfortunately her voice mail box is now full.  Imogene Burn, [name] Palliative [facility] Pager: [phone]

## 2015-08-20 NOTE — Progress Notes (Signed)
Nutrition Brief Note  Pt identified on the </= 12 Braden Score Report. Pt now transitioning to comfort care.  No nutrition interventions warranted at this time.  Please consult as needed.   Katie Emmalia Heyboer, RD, LDN Pager #: [phone] After-Hours Pager #: [phone]   

## 2015-08-20 NOTE — Progress Notes (Signed)
Daily Progress Note   Patient Name: [name]       Date: 08/20/2015 DOB: 07-11-1945  Age: 70 y.o. MRN#: [id number] Attending Physician: Eugenie Filler, MD Primary Care Physician: Wenda Low, MD Admit Date: 08/13/2015  Reason for Consultation/Follow-up: Establishing goals of care  Subjective: Patient opens eyes but doesn't focus.  Doesn't speak.  He does grunt slightly when blood is drawn.    I meet with approximately 8 family members yesterday.  They understand he is dying, but requested that the non rebreather mask, etc... Be continued until more family had a chance to visit.  Chest xray done this am is much improved compared to admission but about the same or slightly worse than yesterday.  Sodium coming down nicely.  We are improving his numbers but his clinical presentation does not appear to be improving.  I called Verita Lamb and left a voice mail.  I anticipate speaking with her today to further refine GOC.  Length of Stay: 3  Current Medications: Scheduled Meds:  . antiseptic oral rinse  7 mL Mouth Rinse q12n4p  . aspirin  81 mg Oral Daily  . ceFEPime (MAXIPIME) IV  1 g Intravenous Q24H  . chlorhexidine  15 mL Mouth Rinse BID  . Chlorhexidine Gluconate Cloth  6 each Topical Q0600  . enoxaparin (LOVENOX) injection  30 mg Subcutaneous Q24H  . ferrous sulfate  300 mg Per Tube Daily  . free water  200 mL Per Tube Q6H  . insulin aspart  0-9 Units Subcutaneous Q4H  . insulin glargine  5 Units Subcutaneous QHS  . mupirocin cream   Topical Daily  . mupirocin ointment  1 application Nasal BID  . pantoprazole sodium  40 mg Per Tube Daily  . sertraline  150 mg Per Tube Daily  . tamsulosin  0.4 mg Oral QPC supper  . vancomycin  1,000 mg Intravenous Q48H    Continuous  Infusions: . dextrose 75 mL/hr at 08/19/15 0842    PRN Meds: acetaminophen **OR** acetaminophen, glycopyrrolate  Physical Exam          Vital Signs: BP 119/62 (BP Location: Left Arm)   Pulse 76   Temp 98.7 F (37.1 C) (Oral)   Resp 20   Ht 5\' 10"  (1.778 m)   Wt 89.5 kg (197 lb 5 oz)  SpO2 92%   BMI 28.31 kg/m  SpO2: SpO2: 92 % O2 Device: O2 Device: NRB O2 Flow Rate: O2 Flow Rate (L/min): 8 L/min  Intake/output summary:   Intake/Output Summary (Last 24 hours) at 08/20/15 [id number] Last data filed at 08/19/15 1538  Gross per 24 hour  Intake              250 ml  Output              850 ml  Net             -600 ml   LBM: Last BM Date: 08/18/15 Baseline Weight: Weight: 85.6 kg (188 lb 11.4 oz) Most recent weight: Weight: 89.5 kg (197 lb 5 oz)       Palliative Assessment/Data:    Flowsheet Rows   Flowsheet Row Most Recent Value  Intake Tab  Referral Department  Hospitalist  Unit at Time of Referral  Cardiac/Telemetry Unit  Palliative Care Primary Diagnosis  Other (Comment)  Date Notified  08/17/15  Palliative Care Type  New Palliative care  Reason for referral  Clarify Goals of Care  Date of Admission  08/27/2015  Date first seen by Palliative Care  08/18/15  # of days Palliative referral response time  1 Day(s)  # of days IP prior to Palliative referral  1  Clinical Assessment  Palliative Performance Scale Score  20%  Psychosocial & Spiritual Assessment  Palliative Care Outcomes  Patient/Family meeting held?  Yes  Who was at the meeting?  Thersa Salt on the phone      Patient Active Problem List   Diagnosis Date Noted  . HCAP (healthcare-associated pneumonia) 08/19/2015  . Palliative care by specialist   . Encounter for hospice care discussion   . Hypernatremia 08/17/2015  . Acute respiratory failure with hypoxia ([place]) 05/12/2015  . Goals of care, counseling/discussion 01/06/2015  . Acute-on-chronic kidney injury ([place]) 01/02/2015  . Chronic diastolic CHF  (congestive heart failure) ([place]) 09/20/2014  . Essential hypertension 09/20/2014  . Diabetes ([place]) 11/03/2013  . Chronic kidney disease 08/14/2012  . Palliative care encounter 07/12/2012  . UTI (urinary tract infection) 07/03/2012  . Anemia in chronic renal disease 06/01/2012  . Type 2 diabetes mellitus ([place]) 06/01/2012  . FISTULA, INTESTINE 02/23/2010  . ADENOCARCINOMA, COLON, HX OF 04/23/2009  . Alzheimer's disease 11/18/2008    Palliative Care Assessment & Plan   Patient Profile: 70 y.o. male  with past medical history of  admitted on 08/24/2015 with Alzheimers dementia, dysphagia with recurrent aspiration, bilateral great toe wounds, Afib, CKD stage IV,  frequent falls and a history of colon cancer.  Assessment: He is now barely responsive, and in Acute respiratory failure (on 8L non rebreather).  Family wants an opportunity to see him before shifting to comfort care.  Recommendations/Plan:  Will likely shift to total comfort today.  Goals of Care and Additional Recommendations:  Limitations on Scope of Treatment: Do not escalate care.  Code Status:   DNR   Prognosis:   Hours - Days  Discharge Planning:  Anticipated Hospital Death  Care plan was discussed with RN and sister [name]  Thank you for allowing the Palliative Medicine Team to assist in the care of this patient.   Time In: 8:10  8:42 Total Time 32 Prolonged Time Billed no      Greater than 50%  of this time was spent counseling and coordinating care related to the above assessment and plan.  Imogene Burn, PA-C Palliative Medicine Pager:  [phone]  Please contact Palliative Medicine Team phone at [phone] for questions and concerns.

## 2015-08-21 DIAGNOSIS — G309 Alzheimer's disease, unspecified: Secondary | ICD-10-CM

## 2015-08-21 DIAGNOSIS — R0600 Dyspnea, unspecified: Secondary | ICD-10-CM

## 2015-08-21 DIAGNOSIS — F0280 Dementia in other diseases classified elsewhere without behavioral disturbance: Secondary | ICD-10-CM

## 2015-08-21 DIAGNOSIS — I10 Essential (primary) hypertension: Secondary | ICD-10-CM

## 2015-08-22 LAB — CULTURE, BLOOD (ROUTINE X 2)
CULTURE: NO GROWTH
Culture: NO GROWTH

## 2015-09-12 NOTE — Discharge Summary (Signed)
Death Summary  [name] KCM:[id number] DOB: 1945/01/28 DOA: 08-30-15  PCP: Wenda Low, MD  Admit date: 08/30/2015 Date of Death: September 04, 2015 Time of Death: 1322 hrs. Notification: Wenda Low, MD notified of death of 04-Sep-2015   History of present illness:  Derrick L Jessupis an 70 y[facility] with significant for dementia,chronic diastolic heart failure and chronic kidney disease stage IV, status post colectomy due to colon cancer with a PEG tube who presentedwith acute encephalopathy andhypernatremia. He had a low grade fever (99), leukocytosis and a h/o recent aspiration and therefore he was started on IV vancomycin and cefepime. Patient was noted to go into acute hypoxia respiratory failure the night of 08/18/2015 to 08/19/2015 subsequently placed on a nonrebreather. Urine cultures which were drawn and came back positive for Klebsiella pneumonia. Blood cultures were ordered which were pending at the time of his death. Patient was also placed on D5W and free water flushes increased per his PEG tube due to his hypernatremia. Patient's hyponatremia improved however patient did not improve clinically. Palliative care consultation was obtained and followed the patient throughout the hospitalization. Patient continued to have increased oxygen requirements with no significant improvement. It was noted that patient had a poor prognosis with his history of Alzheimer's disease, dysphagia with recurrent aspiration status post packed placement. Patient continued to deteriorate with worsening renal function and it was felt patient was actively dying. Palliative care met with family for goals of care and it was decided to make patient for comfort measures. Patient was made comfortable and was in no pain during the hospitalization. Patient remained unresponsive throughout the hospitalization and his breathing continued to be labored. Oxygen was subsequently discontinued. Patient  was pronounced dead on 2015/09/04 at 1322 hrs.  May his soul rest in [place].   Final Diagnoses:  1.   Acute respiratory failure with hypoxia secondary to recurrent aspiration pneumonia/healthcare associated pneumonia 2. Sepsis secondary to UTI and probable aspiration pneumonia/healthcare associated pneumonia 3. Hypernatremia 4. Acute encephalopathy superimposed on Alzheimer's dementia 5. Acute on chronic kidney disease stage IV 6. Anemia of chronic disease 7. Dysphagia status post PEG 8. Chronic diastolic heart failure 9. DO NOT RESUSCITATE status/comfort care   The results of significant diagnostics from this hospitalization (including imaging, microbiology, ancillary and laboratory) are listed below for reference.    Significant Diagnostic Studies: Ct Head Wo Contrast  Result Date: 08/17/2015 CLINICAL DATA:  Altered mental status. History of Alzheimer's dementia, diabetes, stroke, hyperlipidemia, hypertension. EXAM: CT HEAD WITHOUT CONTRAST TECHNIQUE: Contiguous axial images were obtained from the base of the skull through the vertex without intravenous contrast. COMPARISON:  CT HEAD October 29, 2007 FINDINGS: INTRACRANIAL CONTENTS: Moderate to severe ventriculomegaly on the basis of global parenchymal brain volume loss No intraparenchymal hemorrhage, mass effect nor midline shift. Patchy supratentorial white matter hypodensities are within normal range for patient's age and though non-specific likely represent chronic small vessel ischemic disease. 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 No acute large vascular territory infarcts. No abnormal extra-axial fluid collections. Basal cisterns are patent. Moderate calcific atherosclerosis of the carotid siphons. ORBITS: The included ocular globes and orbital contents are non-suspicious. SINUSES: Chronic LEFT sphenoid sinusitis with widened sphenoid ethmoidal recess. Mastoid air cells are well aerated. SKULL/SOFT TISSUES: No skull fracture. No  significant soft tissue swelling. Old distal nasal bone fractures. IMPRESSION: No acute intracranial process. Moderate to severe global parenchymal brain volume loss for age. Mild chronic small vessel ischemic disease. Electronically Signed   By: Thana Farr.D.  On: 08/17/2015 01:49   Dg Chest Port 1 View  Result Date: 08/20/2015 CLINICAL DATA:  Shortness of Breath EXAM: PORTABLE CHEST 1 VIEW COMPARISON:  08/19/2015 FINDINGS: Cardiomediastinal silhouette is stable. No pulmonary edema. Small right pleural effusion again noted. Persistent bilateral basilar atelectasis or infiltrate right greater than left. IMPRESSION: No pulmonary edema. Small right pleural effusion again noted. Persistent bilateral basilar atelectasis or infiltrate right greater than left. Electronically Signed   By: Lahoma Crocker M.D.   On: 08/20/2015 08:34   Dg Chest Port 1 View  Result Date: 08/19/2015 CLINICAL DATA:  Acute onset of shortness of breath. Initial encounter. EXAM: PORTABLE CHEST 1 VIEW COMPARISON:  Chest radiograph performed 08/17/2015 FINDINGS: The lungs are well-aerated. A small right pleural effusion is noted. Bibasilar airspace opacities, right greater than left, raise concern for multifocal pneumonia. Pulmonary edema remains a possibility. No pneumothorax is seen. The cardiomediastinal silhouette is borderline normal in size. No acute osseous abnormalities are seen. IMPRESSION: Small right pleural effusion noted. Bibasilar airspace opacities, right greater than left, raise concern for multifocal pneumonia. Pulmonary edema remains a possibility. Electronically Signed   By: Garald Balding M.D.   On: 08/19/2015 05:09   Dg Chest Port 1 View  Result Date: 08/17/2015 CLINICAL DATA:  Altered mental status. EXAM: PORTABLE CHEST 1 VIEW COMPARISON:  05/13/2015 FINDINGS: Mild decrease in cardiomegaly from prior. There is atherosclerosis of the thoracic aorta. Improvement in diffuse lung opacity from prior with minimal  residual or recurrent patchy lower lobe airspace opacities. No evidence of pleural effusion or pneumothorax. Stable appearance of the osseous structures. IMPRESSION: Ill-defined patchy bibasilar opacities, may reflect atelectasis or scarring. Residual or recurrent pneumonia or pulmonary edema are felt less likely. Electronically Signed   By: Jeb Levering M.D.   On: 08/17/2015 00:53    Microbiology: Recent Results (from the past 240 hour(s))  Blood Culture (routine x 2)     Status: None (Preliminary result)   Collection Time: 08/17/15 12:25 AM  Result Value Ref Range Status   Specimen Description BLOOD LEFT ARM  Final   Special Requests BOTTLES DRAWN AEROBIC AND ANAEROBIC 5CC   Final   Culture NO GROWTH 3 DAYS  Final   Report Status PENDING  Incomplete  Blood Culture (routine x 2)     Status: None (Preliminary result)   Collection Time: 08/17/15 12:37 AM  Result Value Ref Range Status   Specimen Description BLOOD RIGHT HAND  Final   Special Requests BOTTLES DRAWN AEROBIC AND ANAEROBIC 5CC   Final   Culture NO GROWTH 3 DAYS  Final   Report Status PENDING  Incomplete  Urine culture     Status: Abnormal   Collection Time: 08/17/15  2:43 AM  Result Value Ref Range Status   Specimen Description URINE, RANDOM  Final   Special Requests NONE  Final   Culture >=100,000 COLONIES/mL KLEBSIELLA PNEUMONIAE (A)  Final   Report Status 08/19/2015 FINAL  Final   Organism ID, Bacteria KLEBSIELLA PNEUMONIAE (A)  Final      Susceptibility   Klebsiella pneumoniae - MIC*    AMPICILLIN >=32 RESISTANT Resistant     CEFAZOLIN <=4 SENSITIVE Sensitive     CEFTRIAXONE <=1 SENSITIVE Sensitive     CIPROFLOXACIN <=0.25 SENSITIVE Sensitive     GENTAMICIN <=1 SENSITIVE Sensitive     IMIPENEM <=0.25 SENSITIVE Sensitive     NITROFURANTOIN 64 INTERMEDIATE Intermediate     TRIMETH/SULFA <=20 SENSITIVE Sensitive     AMPICILLIN/SULBACTAM 4 SENSITIVE Sensitive  PIP/TAZO 8 SENSITIVE Sensitive     Extended ESBL  NEGATIVE Sensitive     * >=100,000 COLONIES/mL KLEBSIELLA PNEUMONIAE  MRSA PCR Screening     Status: Abnormal   Collection Time: 08/17/15  5:18 AM  Result Value Ref Range Status   MRSA by PCR POSITIVE (A) NEGATIVE Final    Comment:        The GeneXpert MRSA Assay (FDA approved for NASAL specimens only), is one component of a comprehensive MRSA colonization surveillance program. It is not intended to diagnose MRSA infection nor to guide or monitor treatment for MRSA infections. RESULT CALLED TO, READ BACK BY AND VERIFIED WITH: GARDNER,D RN [id number] 08/17/15 MITCHELL,L      Labs: Basic Metabolic Panel:  Recent Labs Lab 08/19/15 0836 08/19/15 1414 08/19/15 2045 08/20/15 0248 08/20/15 0827  NA 147* 146* 144 145 143  K 5.6* 5.5* 4.7 4.6 4.4  CL 120* 117* 115* 115* 116*  CO2 17* 16* 18* 17* 17*  GLUCOSE 163* 238* 164* 153* 140*  BUN 136* 139* 145* 148* 143*  CREATININE 3.97* 4.08* 4.00* 4.07* 3.95*  CALCIUM 9.8 9.5 9.2 9.5 9.2   Liver Function Tests:  Recent Labs Lab 08/17/15 0037  AST 40  ALT 57  ALKPHOS 157*  BILITOT 0.4  PROT 8.7*  ALBUMIN 3.3*   No results for input(s): LIPASE, AMYLASE in the last 168 hours. No results for input(s): AMMONIA in the last 168 hours. CBC:  Recent Labs Lab 08/17/15 0037 08/17/15 0051 08/17/15 0439 08/20/15 0248  WBC 13.4*  --  12.9* 20.6*  NEUTROABS 11.4*  --   --   --   HGB 12.0* 12.9* 11.3* 8.1*  HCT 39.5 38.0* 37.1* 25.5*  MCV 82.5  --  82.6 78.9  PLT 166  --  128* 96*   Cardiac Enzymes: No results for input(s): CKTOTAL, CKMB, CKMBINDEX, TROPONINI in the last 168 hours. D-Dimer No results for input(s): DDIMER in the last 72 hours. BNP: Invalid input(s): POCBNP CBG:  Recent Labs Lab 08/19/15 2010 08/20/15 0024 08/20/15 0446 08/20/15 0801 08/20/15 1129  GLUCAP 159* 139* 141* 142* 134*   Anemia work up No results for input(s): VITAMINB12, FOLATE, FERRITIN, TIBC, IRON, RETICCTPCT in the last 72  hours. Urinalysis    Component Value Date/Time   COLORURINE YELLOW 08/17/2015 0235   APPEARANCEUR CLOUDY (A) 08/17/2015 0235   LABSPEC 1.022 08/17/2015 0235   PHURINE 5.5 08/17/2015 0235   GLUCOSEU NEGATIVE 08/17/2015 0235   HGBUR NEGATIVE 08/17/2015 0235   BILIRUBINUR NEGATIVE 08/17/2015 0235   KETONESUR NEGATIVE 08/17/2015 0235   PROTEINUR 30 (A) 08/17/2015 0235   UROBILINOGEN 0.2 09/21/2014 1835   NITRITE POSITIVE (A) 08/17/2015 0235   LEUKOCYTESUR MODERATE (A) 08/17/2015 0235   Sepsis Labs Invalid input(s): PROCALCITONIN,  WBC,  LACTICIDVEN     SIGNED:  Irine Seal, MD  [facility] August 27, 2015, 3:58 PM Pager   If 7PM-7AM, please contact night-coverage [email or web address] Password [email or web address]

## 2015-09-12 NOTE — Care Management Note (Addendum)
Case Management Note Marvetta Gibbons RN, BSN Unit 2W-Case Manager [phone]  Patient Details  Name: [name] MRN: [id number] Date of Birth: [birth year]  Subjective/Objective:    Pt admitted with AMS- hypernatremia, AKI, SIRS                Action/Plan: PTA pt lived at [facility] consulted for return to SNF when medically stable. Consult received on 8/7- for oral vanc needs- spoke with Dr. Wynelle Cleveland to clarify consult- at this time pt does not need oral vanc- CSW will continue to follow for d/c needs- PC consulted for [place]  Expected Discharge Date:                  Expected Discharge Plan:  [place]  In-House Referral:  Clinical Social Work  Discharge planning Services  CM Consult  Post Acute Care Choice:    Choice offered to:     DME Arranged:    DME Agency:     HH Arranged:    [place] Agency:     Status of Service:  In process, will continue to follow  If discussed at Long Length of Stay Meetings, dates discussed:    Additional Comments:  Sep 18, 2015- 1500- Clema Skousen RN, CM- pt with resp. Failure- now has been made full comfort care and started on morphine drip- pt expired this afternoon at 1322.  Dawayne Patricia, RN 18-Sep-2015, 3:26 PM

## 2015-09-12 NOTE — Progress Notes (Addendum)
Pt passed away on 2015-09-16 at 1:22PM. Pt assessed by Evangeline Dakin RN and Annamaria Boots RN. Patient DNR, no pulse, no respirations, unresponsive.  Primary team (Dr. Grandville Silos) and palliative Imogene Burn) notified at 1327.  Bed placement notified 1330.  [place] notified at [phone]. Spoke with April Shore. Number [phone]. Pt is not a tissue candidate r/t dementia and not an eye candidate r/t age per conversation.  Next of kin, sister [place], notified at [phone]. [name] says she is glad her brother is in a better place and going to communicate with the rest of the family.  Please see post-mortem flow sheet for additional details.  Fritz Pickerel, RN  Co sign [employer] forces training and education officer)

## 2015-09-12 NOTE — Progress Notes (Signed)
Daily Progress Note   Patient Name: [name]       Date: 09/06/2015 DOB: 04-10-1945  Age: 70 y.o. MRN#: [id number] Attending Physician: Eugenie Filler, MD Primary Care Physician: Wenda Low, MD Admit Date: 08/17/2015  Reason for Consultation/Follow-up: Terminal Care  Subjective: Patient is unresponsive.   Breathing is steady, slightly labored.  I spoke with HCPOA, Verita Lamb yesterday afternoon.  She requested that Mr. Dam be made full comfort care.  Oxygen is off now.    I greatly appreciate the excellent RN care he is receiving.  Length of Stay: 4  Current Medications: Scheduled Meds:  . antiseptic oral rinse  7 mL Mouth Rinse q12n4p  . chlorhexidine  15 mL Mouth Rinse BID  . Chlorhexidine Gluconate Cloth  6 each Topical Q0600  . LORazepam  1 mg Intravenous BID  . mupirocin cream   Topical Daily  . mupirocin ointment  1 application Nasal BID  . pantoprazole sodium  40 mg Per Tube Daily  . sertraline  150 mg Per Tube Daily  . sodium chloride flush  3 mL Intravenous Q12H  . tamsulosin  0.4 mg Oral QPC supper    Continuous Infusions: . morphine 5 mg/hr (09/06/2015 0834)    PRN Meds: sodium chloride, acetaminophen **OR** acetaminophen, antiseptic oral rinse, glycopyrrolate **OR** glycopyrrolate **OR** glycopyrrolate, haloperidol **OR** haloperidol **OR** haloperidol lactate, LORazepam **OR** LORazepam **OR** LORazepam, morphine, ondansetron **OR** ondansetron (ZOFRAN) IV, polyvinyl alcohol, sodium chloride flush  Physical Exam      Wd, non responsive  CV rrr Resp mildly increased work of breathing Abdomen:  Soft, PEG in place Extremities:  2+ edema bilaterally in LE     Vital Signs: BP (!) 95/42 (BP Location: Left Arm)   Pulse 73   Temp 99.9 F (37.7  C) (Tympanic)   Resp 20   Ht 5\' 10"  (1.778 m)   Wt 89.5 kg (197 lb 5 oz)   SpO2 94% Comment: on fore head  BMI 28.31 kg/m  SpO2: SpO2: 94 % (on fore head) O2 Device: O2 Device: Not Delivered O2 Flow Rate: O2 Flow Rate (L/min): 5 L/min  Intake/output summary:   Intake/Output Summary (Last 24 hours) at September 06, 2015 0908 Last data filed at 09-06-2015 0646  Gross per 24 hour  Intake  168 ml  Output              725 ml  Net             -557 ml   LBM: Last BM Date: 08/20/15 Baseline Weight: Weight: 85.6 kg (188 lb 11.4 oz) Most recent weight: Weight: 89.5 kg (197 lb 5 oz)       Palliative Assessment/Data:    Flowsheet Rows   Flowsheet Row Most Recent Value  Intake Tab  Referral Department  Hospitalist  Unit at Time of Referral  Cardiac/Telemetry Unit  Palliative Care Primary Diagnosis  Other (Comment)  Date Notified  08/17/15  Palliative Care Type  New Palliative care  Reason for referral  Clarify Goals of Care  Date of Admission  09/09/2015  Date first seen by Palliative Care  08/18/15  # of days Palliative referral response time  1 Day(s)  # of days IP prior to Palliative referral  1  Clinical Assessment  Palliative Performance Scale Score  20%  Psychosocial & Spiritual Assessment  Palliative Care Outcomes  Patient/Family meeting held?  Yes  Who was at the meeting?  Thersa Salt on the phone      Patient Active Problem List   Diagnosis Date Noted  . HCAP (healthcare-associated pneumonia) 08/19/2015  . Palliative care by specialist   . Encounter for hospice care discussion   . Hypernatremia 08/17/2015  . Acute respiratory failure with hypoxia ([place]) 05/12/2015  . Goals of care, counseling/discussion 01/06/2015  . Acute-on-chronic kidney injury ([place]) 01/02/2015  . Chronic diastolic CHF (congestive heart failure) ([place]) 09/20/2014  . Essential hypertension 09/20/2014  . Diabetes ([place]) 11/03/2013  . Chronic kidney disease 08/14/2012  . Palliative care encounter  07/12/2012  . UTI (urinary tract infection) 07/03/2012  . Anemia in chronic renal disease 06/01/2012  . Type 2 diabetes mellitus ([place]) 06/01/2012  . FISTULA, INTESTINE 02/23/2010  . ADENOCARCINOMA, COLON, HX OF 04/23/2009  . Alzheimer's disease 11/18/2008    Palliative Care Assessment & Plan   Patient Profile: 70 y.o. male  with past medical history of  admitted on 09/08/2015 with Alzheimers dementia, dysphagia with recurrent aspiration, bilateral great toe wounds, Afib, CKD stage IV,  frequent falls and a history of colon cancer.  Assessment: He is now non-responsive, full comfort care.  Anticipate he will pass in 24 - 48 hours.  Recommendations/Plan:  Total comfort.    Morphine GTT and Bolus for dyspnea, pain, discomfort.  Goals of Care and Additional Recommendations: Limitations on Scope of Treatment: Full comfort care  Code Status:   DNR  Prognosis:   Hours - Days  Discharge Planning:  Anticipated Hospital Death  Care plan was discussed with RN and sister [name]  Thank you for allowing the Palliative Medicine Team to assist in the care of this patient.   Time In: 8:15  8:30 Total Time 15 Prolonged Time Billed no      Greater than 50%  of this time was spent counseling and coordinating care related to the above assessment and plan.  Imogene Burn, PA-C Palliative Medicine Pager: [phone]  Please contact Palliative Medicine Team phone at [phone] for questions and concerns.

## 2015-09-12 NOTE — Progress Notes (Signed)
Remaining morphine from continuous drip bag wasted, in black container approx 234ml/200mg .  Witnessed by Colletta [place], Therapist, sports.

## 2015-09-12 DEATH — deceased
